# Patient Record
Sex: Male | Born: 1965
Health system: Southern US, Community
[De-identification: ages and names within clinical notes are randomized; demographics above are authoritative.]

## PROBLEM LIST (undated history)

## (undated) DIAGNOSIS — E039 Hypothyroidism, unspecified: Secondary | ICD-10-CM

## (undated) DIAGNOSIS — F319 Bipolar disorder, unspecified: Secondary | ICD-10-CM

## (undated) DIAGNOSIS — E079 Disorder of thyroid, unspecified: Secondary | ICD-10-CM

## (undated) DIAGNOSIS — K219 Gastro-esophageal reflux disease without esophagitis: Secondary | ICD-10-CM

## (undated) HISTORY — PX: ROTATOR CUFF REPAIR: SHX139

## (undated) HISTORY — PX: BACK SURGERY: SHX140

## (undated) HISTORY — PX: KNEE SURGERY: SHX244

## (undated) HISTORY — PX: NOSE SURGERY: SHX723

---

## 1998-11-24 ENCOUNTER — Encounter: Payer: Self-pay | Admitting: Orthopedic Surgery

## 1998-11-24 ENCOUNTER — Observation Stay (HOSPITAL_COMMUNITY): Admission: RE | Admit: 1998-11-24 | Discharge: 1998-11-25 | Payer: Self-pay | Admitting: Orthopedic Surgery

## 2000-08-02 ENCOUNTER — Inpatient Hospital Stay (HOSPITAL_COMMUNITY): Admission: EM | Admit: 2000-08-02 | Discharge: 2000-08-03 | Payer: Self-pay

## 2000-08-02 ENCOUNTER — Encounter: Payer: Self-pay | Admitting: Emergency Medicine

## 2000-08-15 ENCOUNTER — Inpatient Hospital Stay (HOSPITAL_COMMUNITY): Admission: EM | Admit: 2000-08-15 | Discharge: 2000-08-18 | Payer: Self-pay | Admitting: *Deleted

## 2002-02-14 ENCOUNTER — Observation Stay (HOSPITAL_COMMUNITY): Admission: RE | Admit: 2002-02-14 | Discharge: 2002-02-15 | Payer: Self-pay | Admitting: Orthopedic Surgery

## 2004-12-15 ENCOUNTER — Ambulatory Visit (HOSPITAL_COMMUNITY): Admission: RE | Admit: 2004-12-15 | Discharge: 2004-12-15 | Payer: Self-pay | Admitting: Internal Medicine

## 2008-01-25 ENCOUNTER — Ambulatory Visit (HOSPITAL_COMMUNITY): Admission: RE | Admit: 2008-01-25 | Discharge: 2008-01-25 | Payer: Self-pay | Admitting: Family Medicine

## 2010-10-01 ENCOUNTER — Encounter: Admission: RE | Admit: 2010-10-01 | Discharge: 2010-10-01 | Payer: Self-pay | Admitting: Family Medicine

## 2011-04-22 NOTE — Discharge Summary (Signed)
[facility]  Patient:    Dean [name], Dean [name]                MRN: [id number] Adm. Date:  [id number] Disc. Date: [id number] Attending:  Denny Peon                           Discharge Summary  INTRODUCTION:  Dean [name] is a 45 year old white separated male.  He was admitted to the unit after developing suicidal thoughts.  He has a history of suicidal attempt by overdose in 1992.  His current depression came from separation from patients wife and broken new relationship.  The details of the situation prior to admission and past history are available in admission note.  HOSPITAL COURSE:  After admission to the ward, the patient was placed on special observation.  He gave a convincing history of mood swings and episodes of hypomania interwoven with longer episodes of depression.  For this reason I started him on Depakote ER 500 mg at bedtime, which was increased on the day of discharge to 1000 mg daily.  I also started him on Celexa 10 mg at noon. During this brief hospital stay, patient started getting better very quickly. His affect became brighter.  He gained tremendous insight into his condition and seemed to be motivated to make some changes.  He was started on Depakote. He slept better.  His mood seemed to be stabilized.  There were no side effects from medication.  It was the undersigned physicians and treatment team felt he reached maximum benefits from brief hospitalization and could be safely discharged home.  MEDICAL PROBLEMS:  A physical examination was normal.  Vital signs showed normal blood pressure, pulse, lack of fever.  Weight 156, height 5 feet 11-1/2 inches.  LABORATORY:  Blood work showed normal CBC, slight elevation of TSH and T3 uptake, otherwise chemistry-17 and CBC were normal, normal urinalysis.  Drug screen upon admission was negative for substance abuse.  At the time of discharge the second level of Depakote  was not available.  DISCHARGE DIAGNOSES: Axis I:    Bipolar disorder 2, depressed. Axis II:   Dependent personality traits. Axis III:  No diagnosis. Axis IV:   Moderate stressors, problems with primary support group. Axis V:    GAF upon admission 25, upon discharge 50, maximum for past year            is 65.  DISCHARGE RECOMMENDATIONS:  Patient received prescription for Depakote ER 500 mg at bedtime and Celexa 25 mg half tablet daily.  He is supposed to check with family physician to repeat thyroid panel in one month and check valproic acid level and liver panel.  He is supposed to see Dr. Jodi Marble on September 27 at 10:45 a.m. and see therapist _________  on September 28 at 2 p.m.  Patient understood instructions and in good condition was discharged home. DD:  08/18/00 TD:  08/19/00 Job: [id number]

## 2011-04-22 NOTE — H&P (Signed)
[facility]  Patient:    Dean [name], Dean [name]                MRN: [id number] Adm. Date:  [id number] Attending:  Denny Peon Dictator:   Candi Leash. Orsini, N.P.                         History and Physical  REVIEW OF SYSTEMS:  The patient is a 45 year old male who has no complaints of fever or chills.  He reports a weight loss of about 19 pounds over the past four months.  Initial weight at that time was 175.  No headaches.  EYES:  No visual disturbance or double vision.  ENT/MOUTH:  No congestion, sinus pain, hearing loss.  CARDIAC:  No chest pain, palpitations or racing.  Negative hypertension or cardiac disease.  RESPIRATORY:  No cough, shortness of breath or dyspnea, nonsmoker.  GI:  No heartburn, nausea or vomiting, change in habits.  GU:  No dysuria, hematuria or frequency.  MUSCULOSKELETAL:  No cramps, joint stiffness or swelling.  SKIN:  No pruritus, hives or redness, no eczema.  NEUROLOGIC:  No weakness or tremor, no seizure.  States he had a mid concussion about two weeks from a head injury at work.  PSYCHIATRIC:  Some depression, loss of interest.  ENDOCRINE:  No hypoglycemic or hyperglycemic episodes.  HEME/LYMPH:  No bruising, enlarged or tender nodes.  ALLERGIES:  No abnormal sneezing or runny nose.  PHYSICAL EXAMINATION:  GENERAL:  This is a 45 year old white male sitting on the exam table in no acute distress  He is well-developed, appears his stated age.  He is well-groomed, alert and cooperative.  VITAL SIGNS:  Temperature 98.6, heart rate 61, respiratory rate 16, blood pressure 124/70.  His height is 5 feet 11 1/2 inches, 156 pounds.  HEAD:  Head is normocephalic.  He can raise his eyebrows.  EYES:  Pupils equal and reactive to light.  His extraocular muscles are intact bilaterally.  Funduscopic exam was difficult to visualize.  ENT/MOUTH:  External ears are patent no drainage.  TMs are intact.  No perforation or bulging  noted.  Nostrils are patent bilaterally, no drainage. No sinus tenderness.  His mouth mucosa is moist.  He has good dentition. No lesions were seen.  His tongue protrudes midline without tremor.  He can clench his teeth and puff out his cheeks.  His uvula is midline.  No pharyngeal exudate noted.  NECK:  Supple with full range of motion, no JVD, negative lymphadenopathy. His thyroid is nonpalpable, nontender, not enlarged.  His trachea is midline.  RESPIRATORY:  His breath sounds are clear to auscultation, no adventitious sounds.  CARDIOVASCULAR:  Heart rate is regular rate and rhythm without murmurs, rubs, or gallops.  Carotid pulses are equal.  No carotid bruits are auscultated. His pedal pulses are equal and adequate and no edema noted.  ABDOMEN:  Abdomen is flat, positive bowel sounds.  No masses, organomegaly or rebound tenderness.  No CVA tenderness.  LYMPH:  Negative lymphadenopathy.  MUSCULOSKELETAL:  No joint swelling or deformity.  Gait is normal.  Good range of motion.  Muscle strength and tone is equal bilaterally with resistance.  SKIN:  Warm and dry, good skin turgor.  Nail beds are pink with good capillary refill.  NEUROLOGIC:  He is oriented x 3.  His cranial nerves are grossly intact.  Deep tendon reflexes are 2+.  He has good grip strength bilaterally,  no involuntary movements.  His gait is normal.  His Romberg is negative. DD:  08/16/00 TD:  08/17/00 Job: [id number]

## 2011-04-22 NOTE — H&P (Signed)
[facility]  Patient:    Dean [name], Dean [name]                  MRN: [id number] Adm. Date:  08/15/00 Attending:  Netta Cedars, M.D.                   Psychiatric Admission Assessment  INTRODUCTION:  Dean [name] is a 45 year old white separated male. He was admitted after developing suicidal thoughts.  The patient has a history of suicide attempt by overdosing in 1992.  HISTORY OF PRESENT ILLNESS:  The patient complains of being depressed for over one month.  After he separated from his wife or being in the process of separation, he has developed a relationship with a lady with whom he later fell in love.  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  His girlfriend lives in [place].  The letters, emails and calls evolved into a relationship with sexual involvement.  The patient is deeply in love with her and yet, he feels guilty about dissolution of his marriage, especially causing pain to his wife.  A few days ago the patients girlfriend broke up with him.  She seemed to be tired of his guilty ruminations, depressive feelings and indecisiveness.  The patient reported for over a month problems with sleep which increased for the past few days, decreased appetite, obsessive worries feeling hopeless and helpless and having suicidal thoughts.  There were no signs of psychosis.  PAST PSYCHIATRIC HISTORY:  In 1992 he was hospitalized at [facility] with a suicide attempt.  He was started on Prilosec which made him angry and almost hypomanic.  He gives history of mood swings between normal or hypomanic and depressed.  He predominant mood for years was depressed and he attributed this to a loveless marriage.  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 He has a good work history.  His family is supportive, even though they do not approve of his decision to dissolve the  marriage.  He feels close to his two children.  The patient was married for 12 years and never had lasting extra-marital affairs, but he admitted having a few indiscretions.  FAMILY HISTORY:  Significant for depression of the patients sister.  ALCOHOL AND DRUG HISTORY:  The patient denies doing drugs, drinking, smoking, et Karie Soda.  PAST MEDICAL HISTORY:  The patient denies having any medical problems. Recently had several episodes of rapid heart beating which were considered as anxiety attacks.  ALLERGIES:  ERYTHROMYCIN.  PHYSICAL EXAMINATION:  Recent physical examination in the doctors office was normal.  MENTAL STATUS EXAMINATION:  The patient is a thin-built Mediterranean looking white male with sad facial expression, cooperative.  Normal speech, moving in slow pace.  Depressed mood and sad and tearful affect.  Thoughts were with slow pace but no blocking.  Logical, coherent, abstract thinking preserved. He had some suicidal thoughts recently but no articulated plan, feeling hopeless, helpless and very guilty.  Wants help, but does not know if it is possible to get help in his situation.  He is obsessing about the future, about his girlfriend and also about the pain he inflicted upon his wife. He is alert and oriented x 3 with good memory and fair concentration, normal intelligence.  Insight and judgment were both poor.  He sounded sincere.  DIAGNOSTIC IMPRESSION: Axis I:    1. Major depression, recurrent, moderate to severe.  2. Rule out bipolar II disorder, depressed. Axis II:   Dependent personality traits. Axis III:  No diagnosis. Axis IV:   Moderate stressors, problems with primary support group. Axis V:    Global assessment of functioning at present 25, maximum for past            year 65.  PLAN:  Will start the patient on special observation.  Because of history of "hypermanic" reaction to Prozac and history of "mood swings" will start him on Depakote ER  500 mg h.s.  The patient does not give history of pancreatitis or liver disease.  Blood work is pending.  Will start Celexa 10 mg daily to help with depression and obsessive thoughts.  Will try to arrange family meeting, possibly with patients parents to discuss how we can support him in this transitional period.  Will involve him in intensive group therapy while on the unit.  When stable the patient can be safely discharged home.  Side effects of medication were explained in detail to the patient. DD:  08/16/00 TD:  08/17/00 Job: [id number]

## 2011-04-22 NOTE — Op Note (Signed)
[facility]  Patient:    Dean [name], Dean [name] Visit Number: [id number] MRN: [id number]          Service Type: SUR Location: 4W [id number] 01 Attending Physician:  Skip Mayer Dictated by:   Georges Lynch Darrelyn Hillock, M.D. Proc. Date: 02/14/02 Admit Date:  02/14/2002 Discharge Date: 02/15/2002                             Operative Report  SURGEON:  Windy Fast A. Darrelyn Hillock, M.D.  ASSISTANTDruscilla Brownie. Underwood III, P.A.-C.  PREOPERATIVE DIAGNOSES: 1. Tear of the rotator cuff tendon on the right. 2. Severe impingement syndrome of the right shoulder.  POSTOPERATIVE DIAGNOSES: 1. Tear to the rotator cuff tendon on the right. 2. Severe impingement syndrome of the right shoulder.  OPERATION: 1. Partial acromionectomy and acromioplasty. 2. Repair of a tear in the rotator cuff utilizing some transverse sutures.  DESCRIPTION OF PROCEDURE:  The patient first had an intrascalene block.  He was taken back to surgery and under general anesthesia, a routine orthopedic prep and draping of the shoulder on the right was carried out.  He had 1 g of IV Ancef.  At this time, an incision was made over the anterior aspect of the right shoulder.  Bleeders were identified and cauterized.  Following this, self-retaining retractors were inserted.  I then separated the deltoid tendon by sharp dissection from the acromion.  He had a severe impingement syndrome. The acromion literally was imbedding down into his rotator cuff, and he had a hole in his rotator cuff at its insertion.  I thoroughly irrigated out the area.  First, I protected the remaining part of the cuff with a Bennett retractor after I excised the subdeltoid bursa.  I then utilized the oscillating saw and did a partial acromionectomy.  I then utilized the bur to even out the rough bone edges.  Once this was done, we had a good amount of space present now for his shoulder motion.  I then went down and opened  the cuff.  He had a hole distally.  I opened it and retracted the cuff a little bit so I could bur up under the cuff.  I then irrigated the area and then sewed the cuff back down with a #1 Ethibond suture.  The wound was thoroughly irrigated, the deltoid tendon and muscle were reattached in the usual fashion. I then closed the subcu with 0 Vicryl.  Skin was closed with metal staples. Neosporin dressing was placed on his wound, and I then placed him in a shoulder immobilizer. Dictated by:   Georges Lynch Darrelyn Hillock, M.D. Attending Physician:  Skip Mayer DD:  02/14/02 TD:  02/16/02 Job: [id number]

## 2011-04-22 NOTE — Consult Note (Signed)
[place]  Patient:    Dean [name], Dean [name]                MRN: [id number] Proc. Date: 08/02/00 Adm. Date:  [id number] Attending:  Trauma, Md CC:         Lorne Skeens. Hoxworth, M.D.   Consultation Report  REASON FOR CONSULTATION:  Consultation requested by emergency room and trauma service after a closed head injury.  HISTORY OF PRESENT ILLNESS:  The patient is a 45 year old white male who slipped and fell backwards, hitting the back of his head.  He has amnesia for the event but does have good memory both before and after the event.  He could not move or feel arms or legs immediately after the event.  He was brought to the hospital and about that time did start having feeling and then started moving both arms and legs, at first weakly, but then better and better.  He was able to urinate on his own and now complains of posterior occipital headache and some low back pain but has no neck pain and no complaints with feeling or moving of his upper and lower extremities.  PAST MEDICAL HISTORY:  Otherwise negative.  MEDICATIONS:  None.  ALLERGIES:  No known drug allergies.  SOCIAL HISTORY:  He is employed.  REVIEW OF SYSTEMS:  Otherwise negative.  PHYSICAL EXAMINATION:  NEUROLOGIC:  He is awake, alert, oriented x 3.  Cranial nerves II-XII were examined and were intact.  He has appropriate affect and normal fund of knowledge.  No language dysfunction, no aphasia, no anomia.  Motor strength was done moving upper and lower extremities well, 4+ to 5/5 in strength in all muscle groups.  There is no pronator drift.  Sensation is intact in all distributions.  No sensory level found.  Rapid alternating movements are done fairly well in the upper and lower extremities and equal bilaterally.  Toes are downgoing bilaterally.  There is no clonus in either ankle.  Deep tendon reflexes are 2/4 in the upper and lower extremities.  Gait is deferred at  this point.  We did evaluate the CTs prior to having him move his neck.  HEENT:  Head is normocephalic.  No sign of trauma.  TMs are clear.  NECK:  Nontender, and he he has good range of motion in all directions widow any pain or any radicular symptoms.  DIAGNOSTIC EVALUATION:  X-rays of the thoracic and lumbar spine were negative for any fracture or any acute changes.  There is some L5-S1 spondylosis.  CT of the head was negative with no fractures, no intracranial hemorrhage, no _____ midline shift.  The ventricles are normal.  The CT of the cervical spine along with the reconstruction and sagittal views showed normal alignment and no fractures, no sign of herniated disk, no cord compression, no stenosis.  ASSESSMENT:  Patient with closed head injury and spinal concussion, but symptoms now resolved.  I agree that he needs to be admitted.  The trauma service can admit him for observation.  We will definitely follow along.  He can increase activity as tolerated and hopefully discharge soon, hopefully with no further sequelae. DD:  08/02/00 TD:  08/03/00 Job: [id number]

## 2015-08-19 ENCOUNTER — Encounter (HOSPITAL_COMMUNITY): Payer: Self-pay | Admitting: *Deleted

## 2015-08-19 ENCOUNTER — Emergency Department (HOSPITAL_COMMUNITY)
Admission: EM | Admit: 2015-08-19 | Discharge: 2015-08-20 | Disposition: A | Discharge status: Home / Self Care | Payer: 59 | Attending: Emergency Medicine | Admitting: Emergency Medicine

## 2015-08-19 ENCOUNTER — Emergency Department (HOSPITAL_COMMUNITY): Payer: 59

## 2015-08-19 DIAGNOSIS — E079 Disorder of thyroid, unspecified: Secondary | ICD-10-CM | POA: Insufficient documentation

## 2015-08-19 DIAGNOSIS — Z87891 Personal history of nicotine dependence: Secondary | ICD-10-CM | POA: Diagnosis not present

## 2015-08-19 DIAGNOSIS — F319 Bipolar disorder, unspecified: Secondary | ICD-10-CM | POA: Diagnosis not present

## 2015-08-19 DIAGNOSIS — R0602 Shortness of breath: Secondary | ICD-10-CM | POA: Insufficient documentation

## 2015-08-19 DIAGNOSIS — R079 Chest pain, unspecified: Secondary | ICD-10-CM | POA: Diagnosis not present

## 2015-08-19 DIAGNOSIS — Z79899 Other long term (current) drug therapy: Secondary | ICD-10-CM | POA: Diagnosis not present

## 2015-08-19 HISTORY — DX: Bipolar disorder, unspecified: F31.9

## 2015-08-19 HISTORY — DX: Disorder of thyroid, unspecified: E07.9

## 2015-08-19 LAB — BASIC METABOLIC PANEL
ANION GAP: 12 (ref 5–15)
BUN: 14 mg/dL (ref 6–20)
CALCIUM: 10 mg/dL (ref 8.9–10.3)
CHLORIDE: 98 mmol/L — AB (ref 101–111)
CO2: 25 mmol/L (ref 22–32)
Creatinine, Ser: 1.15 mg/dL (ref 0.61–1.24)
GFR calc non Af Amer: >60 mL/min (ref >60)
Glucose, Bld: 89 mg/dL (ref 65–99)
Potassium: 3.6 mmol/L (ref 3.5–5.1)
Sodium: 135 mmol/L (ref 135–145)

## 2015-08-19 LAB — CBC
HCT: 46.7 % (ref 39.0–52.0)
HEMOGLOBIN: 17.1 g/dL — AB (ref 13.0–17.0)
MCH: 35.2 pg — AB (ref 26.0–34.0)
MCHC: 36.6 g/dL — ABNORMAL HIGH (ref 30.0–36.0)
MCV: 96.1 fL (ref 78.0–100.0)
Platelets: 170 10*3/uL (ref 150–400)
RBC: 4.86 MIL/uL (ref 4.22–5.81)
RDW: 12.5 % (ref 11.5–15.5)
WBC: 5 10*3/uL (ref 4.0–10.5)

## 2015-08-19 LAB — I-STAT TROPONIN, ED
TROPONIN I, POC: 0 ng/mL (ref 0.00–0.08)
Troponin i, poc: 0 ng/mL (ref 0.00–0.08)

## 2015-08-19 MED ORDER — KETOROLAC TROMETHAMINE 60 MG/2ML IM SOLN
30.0000 mg | Freq: Once | INTRAMUSCULAR | Status: AC
  Administered 2015-08-19: 30 mg via INTRAMUSCULAR
  Filled 2015-08-19: qty 2

## 2015-08-19 NOTE — ED Notes (Signed)
Started with a HA and took Tylenol.  The HA went away and then started feeling "funny" with central CP that radiated to his left neck and shoulder +SOB

## 2015-08-19 NOTE — ED Provider Notes (Signed)
CSN: [id number]     Arrival date & time 08/19/15  1911 History   First MD Initiated Contact with Patient 08/19/15 2104     Chief Complaint  Patient presents with  . Chest Pain  . Shortness of Breath    (Consider location/radiation/quality/duration/timing/severity/associated sxs/prior Treatment) HPI Comments: 49 year old male with a history of bipolar 1 disorder and hypothyroidism presents to the emergency department for further evaluation of chest pain. Patient states that chest pain began at 1700. He describes the pain as central and tightening, radiating to between his shoulder blades and his left neck and shoulder. Patient was some associated shortness of breath. He states that he feels as though his symptoms have improved slightly since onset. They have been constant. He reports the symptoms were preceded by a headache for which she took Tylenol. Headache resolved prior to onset of chest discomfort. Patient denies associated fever, lightheadedness, dizziness, syncope, nausea, vomiting, diaphoresis, extremity numbness/tingling, and extremity weakness. He states that he feels as though his legs has been "heavy", but he has had no difficulty ambulating. No personal history of hypertension, diabetes, or ACS. No family history of ACS that is known to the patient, but he does report that his father has had stents placed.  Patient is a 49 y.o. male presenting with chest pain and shortness of breath. The history is provided by the patient. No language interpreter was used.  Chest Pain Associated symptoms: shortness of breath   Associated symptoms: no diaphoresis, no dizziness, no fever, no nausea and not vomiting   Shortness of Breath Associated symptoms: chest pain   Associated symptoms: no diaphoresis, no fever and no vomiting     Past Medical History  Diagnosis Date  . Bipolar 1 disorder   . Thyroid disease    Past Surgical History  Procedure Laterality Date  . Back surgery    . Rotator  cuff repair    . Nose surgery    . Knee surgery     No family history on file. Social History  Substance Use Topics  . Smoking status: Former Research scientist (life sciences)  . Smokeless tobacco: Never Used  . Alcohol Use: Yes    Review of Systems  Constitutional: Negative for fever and diaphoresis.  Respiratory: Positive for shortness of breath.   Cardiovascular: Positive for chest pain.  Gastrointestinal: Negative for nausea and vomiting.  Neurological: Negative for dizziness, syncope and light-headedness.  All other systems reviewed and are negative.   Allergies  Ibuprofen  Home Medications   Prior to Admission medications   Medication Sig Start Date End Date Taking? Authorizing Provider  ALPRAZolam Duanne Moron) 0.5 MG tablet Take 0.5 mg by mouth at bedtime as needed for anxiety.   Yes Historical Provider, MD  divalproex (DEPAKOTE ER) 500 MG 24 hr tablet Take 500 mg by mouth 4 (four) times daily. Take 4 times a day per patient   Yes Historical Provider, MD  levothyroxine (SYNTHROID, LEVOTHROID) 175 MCG tablet Take 175 mcg by mouth daily before breakfast.   Yes Historical Provider, MD  pantoprazole (PROTONIX) 40 MG tablet Take 40 mg by mouth daily.   Yes Historical Provider, MD   BP 141/94 mmHg  Pulse 59  Temp(Src) 97.9 F (36.6 C) (Oral)  Resp 14  Ht 5\' 11"  (1.803 m)  Wt 187 lb (84.823 kg)  BMI 26.09 kg/m2  SpO2 99%   Physical Exam  Constitutional: He is oriented to person, place, and time. He appears well-developed and well-nourished. No distress.  Nontoxic/nonseptic appearing  HENT:  Head: Normocephalic and atraumatic.  Eyes: Conjunctivae and EOM are normal. No scleral icterus.  Neck: Normal range of motion.  Cardiovascular: Normal rate, regular rhythm and intact distal pulses.   Pulmonary/Chest: Effort normal and breath sounds normal. No respiratory distress. He has no wheezes. He has no rales.  Respirations even and unlabored. Lungs clear.  Abdominal: Soft. He exhibits no distension.  There is no tenderness.  Soft, nontender  Musculoskeletal: Normal range of motion.  Neurological: He is alert and oriented to person, place, and time. He exhibits normal muscle tone. Coordination normal.  GCS 15. Speech is goal oriented. Patient moves extremities without ataxia. He is ambulatory with steady gait.  Skin: Skin is warm and dry. No rash noted. He is not diaphoretic. No erythema. No pallor.  Psychiatric: He has a normal mood and affect. His behavior is normal.  Nursing note and vitals reviewed.   ED Course  Procedures (including critical care time) Labs Review Labs Reviewed  BASIC METABOLIC PANEL - Abnormal; Notable for the following:    Chloride 98 (*)    All other components within normal limits  CBC - Abnormal; Notable for the following:    Hemoglobin 17.1 (*)    MCH 35.2 (*)    MCHC 36.6 (*)    All other components within normal limits  I-STAT TROPOININ, ED  Randolm Idol, ED    Imaging Review Dg Chest 2 View  08/19/2015   CLINICAL DATA:  Shortness of breath and midsternal chest pain today.  EXAM: CHEST  2 VIEW  COMPARISON:  None.  FINDINGS: The cardiac silhouette, mediastinal and hilar contours are within normal limits. The lungs are clear. No pleural effusion. The bony thorax is intact.  IMPRESSION: No acute cardiopulmonary findings.   Electronically Signed   By: Marijo Sanes M.D.   On: 08/19/2015 20:23   I have personally reviewed and evaluated these images and lab results as part of my medical decision-making.   EKG Interpretation   Date/Time:  Wednesday August 19 2015 19:15:09 EDT Ventricular Rate:  82 PR Interval:  142 QRS Duration: 84 QT Interval:  388 QTC Calculation: 453 R Axis:   77 Text Interpretation:  Normal sinus rhythm Normal ECG No previous ECGs  available Confirmed by ZACKOWSKI  MD, SCOTT [phone]) on 08/19/2015 11:07:26  PM      2145 - Heart Score - 3 (suspicion, age, and risk factors) c/w low risk of MACE PERC negative Plan  delta troponin at 2230. Patient agreeable to plan. MDM   Final diagnoses:  Chest pain, unspecified chest pain type    49 year old male presents to the emergency department for evaluation of chest pain. Patient has a nonischemic EKG. Troponin negative 2. Pain has slightly improved without intervention. Doubt ACS as cause of symptoms. Heart score today is 3 consistent with low risk of ACS. No mediastinal widening to suggest dissection. Chest x-ray negative for pneumothorax, pneumonia, and pleural effusion. Also doubt pulmonary embolus. Symptoms may be secondary to anxiety. Patient has a known history of bipolar 1 disorder. Have advised the patient follow-up with his primary care doctor for further evaluation of his symptoms as well as for scheduling of an outpatient stress test. No indication for further emergent workup at this time. Return precautions discussed and provided. Patient agreeable to plan with no unaddressed concerns. Patient discharged in good condition; VSS.   Filed Vitals:   08/19/15 1922 08/19/15 2123 08/19/15 2140  BP: 124/84 150/90 141/94  Pulse: 68 62 59  Temp: 97.9  F (36.6 C)    TempSrc: Oral    Resp: 16  14  Height: 5\' 11"  (1.803 m)    Weight: 187 lb (84.823 kg)    SpO2: 97% 99% 99%     Antonietta Breach, PA-C 08/19/15 2318  Antonietta Breach, PA-C 08/20/15 0000  Fredia Sorrow, MD 08/20/15 [phone]

## 2015-08-19 NOTE — Discharge Instructions (Signed)

## 2015-08-20 NOTE — ED Notes (Signed)
Pt able to ambulate independently.  Gait steady and even.

## 2016-09-14 ENCOUNTER — Ambulatory Visit
Admission: RE | Admit: 2016-09-14 | Discharge: 2016-09-14 | Disposition: A | Discharge status: Home / Self Care | Payer: 59 | Source: Ambulatory Visit | Attending: Physician Assistant | Admitting: Physician Assistant

## 2016-09-14 ENCOUNTER — Other Ambulatory Visit: Payer: Self-pay | Admitting: Physician Assistant

## 2016-09-14 DIAGNOSIS — M25551 Pain in right hip: Secondary | ICD-10-CM

## 2016-12-12 DIAGNOSIS — F331 Major depressive disorder, recurrent, moderate: Secondary | ICD-10-CM | POA: Diagnosis not present

## 2016-12-13 DIAGNOSIS — M25551 Pain in right hip: Secondary | ICD-10-CM | POA: Diagnosis not present

## 2016-12-13 DIAGNOSIS — G8929 Other chronic pain: Secondary | ICD-10-CM | POA: Diagnosis not present

## 2016-12-29 DIAGNOSIS — M1611 Unilateral primary osteoarthritis, right hip: Secondary | ICD-10-CM | POA: Diagnosis not present

## 2017-03-15 DIAGNOSIS — Z0000 Encounter for general adult medical examination without abnormal findings: Secondary | ICD-10-CM | POA: Diagnosis not present

## 2017-03-15 DIAGNOSIS — E039 Hypothyroidism, unspecified: Secondary | ICD-10-CM | POA: Diagnosis not present

## 2017-03-15 DIAGNOSIS — Z23 Encounter for immunization: Secondary | ICD-10-CM | POA: Diagnosis not present

## 2017-03-16 DIAGNOSIS — R7303 Prediabetes: Secondary | ICD-10-CM | POA: Diagnosis not present

## 2017-09-14 DIAGNOSIS — K219 Gastro-esophageal reflux disease without esophagitis: Secondary | ICD-10-CM | POA: Diagnosis not present

## 2017-09-14 DIAGNOSIS — M1990 Unspecified osteoarthritis, unspecified site: Secondary | ICD-10-CM | POA: Diagnosis not present

## 2017-09-14 DIAGNOSIS — E039 Hypothyroidism, unspecified: Secondary | ICD-10-CM | POA: Diagnosis not present

## 2017-10-10 DIAGNOSIS — M7062 Trochanteric bursitis, left hip: Secondary | ICD-10-CM | POA: Diagnosis not present

## 2017-10-16 DIAGNOSIS — M7062 Trochanteric bursitis, left hip: Secondary | ICD-10-CM | POA: Diagnosis not present

## 2017-10-23 DIAGNOSIS — M7062 Trochanteric bursitis, left hip: Secondary | ICD-10-CM | POA: Diagnosis not present

## 2018-02-13 DIAGNOSIS — F331 Major depressive disorder, recurrent, moderate: Secondary | ICD-10-CM | POA: Diagnosis not present

## 2018-08-11 DIAGNOSIS — M109 Gout, unspecified: Secondary | ICD-10-CM | POA: Diagnosis not present

## 2018-08-24 DIAGNOSIS — M79674 Pain in right toe(s): Secondary | ICD-10-CM | POA: Diagnosis not present

## 2018-12-24 ENCOUNTER — Other Ambulatory Visit: Payer: Self-pay | Admitting: Psychiatry

## 2018-12-24 NOTE — Telephone Encounter (Signed)
Review paper chart not seen in epic 

## 2018-12-25 ENCOUNTER — Encounter: Payer: Self-pay | Admitting: Emergency Medicine

## 2018-12-25 DIAGNOSIS — F319 Bipolar disorder, unspecified: Secondary | ICD-10-CM | POA: Insufficient documentation

## 2019-02-12 ENCOUNTER — Ambulatory Visit (INDEPENDENT_AMBULATORY_CARE_PROVIDER_SITE_OTHER): Payer: BLUE CROSS/BLUE SHIELD | Admitting: Psychiatry

## 2019-02-12 ENCOUNTER — Encounter: Payer: Self-pay | Admitting: Psychiatry

## 2019-02-12 DIAGNOSIS — F3181 Bipolar II disorder: Secondary | ICD-10-CM

## 2019-02-12 DIAGNOSIS — F5105 Insomnia due to other mental disorder: Secondary | ICD-10-CM | POA: Diagnosis not present

## 2019-02-12 MED ORDER — DIVALPROEX SODIUM ER 500 MG PO TB24: 2500.0000 mg | ORAL_TABLET | Freq: Every day | ORAL | 3 refills | Status: DC

## 2019-02-12 NOTE — Progress Notes (Signed)
Dean [name] [id number] Jun 24, 1966 53 y.o.  Subjective:   Patient ID:  Dean [name] is a 53 y.o. (DOB August 22, 1966) male.  Chief Complaint:  Chief Complaint  Patient presents with  . Follow-up    Medication Management    HPI Dean [name] presents to the office today for follow-up of bipolar disorder.  Recent changes 2 jobs starting February which reduced amount of sleep.  About 6-7 hours of sleep.  Tries to make up on the weekend.  Mood has been ok.  Tired and handling it better than expected.    Patient reports stable mood and denies depressed or irritable moods.  Patient denies any recent difficulty with anxiety.  Patient denies difficulty with sleep initiation or maintenance bc he's so tired.  Not using Xanax bc hangover 3 hours.. Denies appetite disturbance.  Patient reports that energy and motivation have been good.  Patient denies any difficulty with concentration.  Patient denies any suicidal ideation.  Past Psychiatric Medication Trials: Depakote 2500 mg, buspirone 15 twice daily, Lexapro 10, Xanax XR, alprazolam, Ambien, citalopram 20, hydroxyzine, trazodone  Review of Systems:  Review of Systems  Musculoskeletal: Positive for arthralgias.  Neurological: Negative for tremors and weakness.  Psychiatric/Behavioral: Negative for agitation, behavioral problems, confusion, decreased concentration, dysphoric mood, hallucinations, self-injury, sleep disturbance and suicidal ideas. The patient is not nervous/anxious and is not hyperactive.     Medications: I have reviewed the patient's current medications.  Current Outpatient Medications  Medication Sig Dispense Refill  . ALPRAZolam (XANAX) 0.5 MG tablet Take 0.5 mg by mouth at bedtime as needed for anxiety.    . divalproex (DEPAKOTE ER) 500 MG 24 hr tablet Take 5 tablets (2,500 mg total) by mouth daily. 450 tablet 3  . levothyroxine (SYNTHROID, LEVOTHROID) 175 MCG tablet Take 175 mcg by mouth daily before  breakfast.    . Multiple Vitamins-Minerals (ONE DAILY MENS 50+ MULTIVIT) TABS Take by mouth.    . pantoprazole (PROTONIX) 40 MG tablet Take 40 mg by mouth daily.     No current facility-administered medications for this visit.     Medication Side Effects: None  Allergies:  Allergies  Allergen Reactions  . Erythromycin Nausea Only  . Ibuprofen Other (See Comments)    Peptic Ulcer    Past Medical History:  Diagnosis Date  . Bipolar 1 disorder ([place])   . Thyroid disease     History reviewed. No pertinent family history.  Social History   Socioeconomic History  . Marital status: Married    Spouse name: Not on file  . Number of children: Not on file  . Years of education: Not on file  . Highest education level: Not on file  Occupational History  . Not on file  Social Needs  . Financial resource strain: Not on file  . Food insecurity:    Worry: Not on file    Inability: Not on file  . Transportation needs:    Medical: Not on file    Non-medical: Not on file  Tobacco Use  . Smoking status: Former Research scientist (life sciences)  . Smokeless tobacco: Never Used  Substance and Sexual Activity  . Alcohol use: Yes  . Drug use: No  . Sexual activity: Not on file  Lifestyle  . Physical activity:    Days per week: Not on file    Minutes per session: Not on file  . Stress: Not on file  Relationships  . Social connections:    Talks on phone: Not on file  Gets together: Not on file    Attends religious service: Not on file    Active member of club or organization: Not on file    Attends meetings of clubs or organizations: Not on file    Relationship status: Not on file  . Intimate partner violence:    Fear of current or ex partner: Not on file    Emotionally abused: Not on file    Physically abused: Not on file    Forced sexual activity: Not on file  Other Topics Concern  . Not on file  Social History Narrative  . Not on file    Past Medical History, Surgical history, Social history,  and Family history were reviewed and updated as appropriate.   Please see review of systems for further details on the patient's review from today.   Objective:   Physical Exam:  There were no vitals taken for this visit.  Physical Exam Constitutional:      General: He is not in acute distress.    Appearance: He is well-developed.  Musculoskeletal:        General: No deformity.  Neurological:     Mental Status: He is alert and oriented to person, place, and time.     Motor: No tremor.     Coordination: Coordination normal.     Gait: Gait normal.  Psychiatric:        Attention and Perception: Attention normal. He is attentive. He does not perceive auditory hallucinations.        Mood and Affect: Mood normal. Mood is not anxious or depressed. Affect is not labile, blunt, angry or inappropriate.        Speech: Speech normal.        Behavior: Behavior normal.        Thought Content: Thought content normal. Thought content does not include homicidal or suicidal ideation. Thought content does not include homicidal or suicidal plan.        Cognition and Memory: Cognition normal.        Judgment: Judgment normal.     Comments: Insight is good.     Lab Review:     Component Value Date/Time   NA 135 08/19/2015 1925   K 3.6 08/19/2015 1925   CL 98 (L) 08/19/2015 1925   CO2 25 08/19/2015 1925   GLUCOSE 89 08/19/2015 1925   BUN 14 08/19/2015 1925   CREATININE 1.15 08/19/2015 1925   CALCIUM 10.0 08/19/2015 1925   GFRNONAA >60 08/19/2015 1925   GFRAA >60 08/19/2015 1925       Component Value Date/Time   WBC 5.0 08/19/2015 1925   RBC 4.86 08/19/2015 1925   HGB 17.1 (H) 08/19/2015 1925   HCT 46.7 08/19/2015 1925   PLT 170 08/19/2015 1925   MCV 96.1 08/19/2015 1925   MCH 35.2 (H) 08/19/2015 1925   MCHC 36.6 (H) 08/19/2015 1925   RDW 12.5 08/19/2015 1925    No results found for: POCLITH, LITHIUM   No results found for: PHENYTOIN, PHENOBARB, VALPROATE, CBMZ    .res Assessment: Plan:    Bipolar II disorder ([place]) - Plan: divalproex (DEPAKOTE ER) 500 MG 24 hr tablet  Insomnia due to mental condition   Disc risk mood cycling even on meds and to call us if this occurs.  Benefit from meds.  Is actually taking 4 Depakote instead of 5 bc better tolerated and doing well.  At one point he took 2500 mg Depakote because of irritability but that has leveled  off.  His sleep is improved because of heavy work schedule.Marland Kitchen  He has a history of chronic insomnia.  Because stable for extended period and for cost reasons he has to be seen once yearly.  He asked to defer any labs.  Discussed risks of Depakote to the liver and potentially pancreas.  Continue current meds.  He agrees  Hiram Comber, MD, DFAPA Please see After Visit Summary for patient specific instructions.  Future Appointments  Date Time Provider [place]  02/12/2020  9:45 AM Cottle, Billey Co., MD [facility] None    No orders of the defined types were placed in this encounter.     -------------------------------

## 2019-03-06 DIAGNOSIS — E039 Hypothyroidism, unspecified: Secondary | ICD-10-CM | POA: Diagnosis not present

## 2019-03-06 DIAGNOSIS — Z125 Encounter for screening for malignant neoplasm of prostate: Secondary | ICD-10-CM | POA: Diagnosis not present

## 2019-03-06 DIAGNOSIS — Z0000 Encounter for general adult medical examination without abnormal findings: Secondary | ICD-10-CM | POA: Diagnosis not present

## 2019-03-06 DIAGNOSIS — R7301 Impaired fasting glucose: Secondary | ICD-10-CM | POA: Diagnosis not present

## 2019-03-06 DIAGNOSIS — E7889 Other lipoprotein metabolism disorders: Secondary | ICD-10-CM | POA: Diagnosis not present

## 2019-04-03 DIAGNOSIS — D72819 Decreased white blood cell count, unspecified: Secondary | ICD-10-CM | POA: Diagnosis not present

## 2019-04-30 DIAGNOSIS — D72819 Decreased white blood cell count, unspecified: Secondary | ICD-10-CM | POA: Diagnosis not present

## 2019-07-16 ENCOUNTER — Other Ambulatory Visit: Payer: Self-pay

## 2019-07-16 MED ORDER — ALPRAZOLAM 0.5 MG PO TABS: 0.5000 mg | ORAL_TABLET | Freq: Every evening | ORAL | 0 refills | Status: DC | PRN

## 2019-07-25 DIAGNOSIS — Z01818 Encounter for other preprocedural examination: Secondary | ICD-10-CM | POA: Diagnosis not present

## 2019-07-25 DIAGNOSIS — Z1211 Encounter for screening for malignant neoplasm of colon: Secondary | ICD-10-CM | POA: Diagnosis not present

## 2019-09-23 DIAGNOSIS — E039 Hypothyroidism, unspecified: Secondary | ICD-10-CM | POA: Diagnosis not present

## 2019-09-23 DIAGNOSIS — F319 Bipolar disorder, unspecified: Secondary | ICD-10-CM | POA: Diagnosis not present

## 2019-09-26 DIAGNOSIS — Z1159 Encounter for screening for other viral diseases: Secondary | ICD-10-CM | POA: Diagnosis not present

## 2019-09-26 DIAGNOSIS — E039 Hypothyroidism, unspecified: Secondary | ICD-10-CM | POA: Diagnosis not present

## 2019-10-01 DIAGNOSIS — D125 Benign neoplasm of sigmoid colon: Secondary | ICD-10-CM | POA: Diagnosis not present

## 2019-10-01 DIAGNOSIS — K635 Polyp of colon: Secondary | ICD-10-CM | POA: Diagnosis not present

## 2019-10-01 DIAGNOSIS — D124 Benign neoplasm of descending colon: Secondary | ICD-10-CM | POA: Diagnosis not present

## 2019-10-01 DIAGNOSIS — D128 Benign neoplasm of rectum: Secondary | ICD-10-CM | POA: Diagnosis not present

## 2019-10-01 DIAGNOSIS — K640 First degree hemorrhoids: Secondary | ICD-10-CM | POA: Diagnosis not present

## 2019-10-01 DIAGNOSIS — Z1211 Encounter for screening for malignant neoplasm of colon: Secondary | ICD-10-CM | POA: Diagnosis not present

## 2019-10-01 DIAGNOSIS — D123 Benign neoplasm of transverse colon: Secondary | ICD-10-CM | POA: Diagnosis not present

## 2020-02-12 ENCOUNTER — Ambulatory Visit: Payer: BLUE CROSS/BLUE SHIELD | Admitting: Psychiatry

## 2020-02-14 ENCOUNTER — Encounter: Payer: Self-pay | Admitting: Psychiatry

## 2020-02-14 ENCOUNTER — Ambulatory Visit (INDEPENDENT_AMBULATORY_CARE_PROVIDER_SITE_OTHER): Payer: BC Managed Care – PPO | Admitting: Psychiatry

## 2020-02-14 DIAGNOSIS — F3181 Bipolar II disorder: Secondary | ICD-10-CM

## 2020-02-14 DIAGNOSIS — F5105 Insomnia due to other mental disorder: Secondary | ICD-10-CM

## 2020-02-14 MED ORDER — DIVALPROEX SODIUM ER 500 MG PO TB24: 2500.0000 mg | ORAL_TABLET | Freq: Every day | ORAL | 3 refills | Status: DC

## 2020-02-14 NOTE — Progress Notes (Signed)
Dean [name] [id number] 1966/04/01 54 y.o.  Virtual Visit via [facility]  I connected with pt by WebEx and verified that I am speaking with the correct person using two identifiers.   I discussed the limitations, risks, security and privacy concerns of performing an evaluation and management service by [facility] and the availability of in person appointments. I also discussed with the patient that there may be a patient responsible charge related to this service. The patient expressed understanding and agreed to proceed.  I discussed the assessment and treatment plan with the patient. The patient was provided an opportunity to ask questions and all were answered. The patient agreed with the plan and demonstrated an understanding of the instructions.   The patient was advised to call back or seek an in-person evaluation if the symptoms worsen or if the condition fails to improve as anticipated.  I provided 30 minutes of video time during this encounter. The call started at 1100 and ended at 11:30. The patient was located at home and the provider was located office.  Subjective:   Patient ID:  Dean [name] is a 54 y.o. (DOB 1966/03/16) male.  Chief Complaint:  Chief Complaint  Patient presents with  . Follow-up    Medication Management  . Other    Bipolar 2    HPI Yasmin Zucca Clum presents today for follow-up of bipolar disorder.  Last seen March 2020.  No meds were changed. Depakote ER 2500 mg daily.  Rare Xanax.  Managing Covid.  Got first vaccination.   Still working 2 jobs.  Better than last year.  Used to schedule.No problems with meds.    About 6-7 hours of sleep.  Tries to make up on the weekend.  Mood has been ok.  Tired and handling it better than expected.    Patient reports stable mood and denies depressed or irritable moods.  Patient denies any recent difficulty with anxiety.  Patient denies difficulty with sleep initiation or maintenance bc he's so tired.   Not using Xanax bc hangover 3 hours.. Denies appetite disturbance.  Patient reports that energy and motivation have been good.  Patient denies any difficulty with concentration.  Patient denies any suicidal ideation.  Past Psychiatric Medication Trials: Depakote 2500 mg, buspirone 15 twice daily, Lexapro 10, Xanax XR, alprazolam, Ambien, citalopram 20, hydroxyzine, trazodone  Review of Systems:  Review of Systems  Musculoskeletal: Positive for arthralgias.  Neurological: Negative for tremors and weakness.  Psychiatric/Behavioral: Negative for agitation, behavioral problems, confusion, decreased concentration, dysphoric mood, hallucinations, self-injury, sleep disturbance and suicidal ideas. The patient is not nervous/anxious and is not hyperactive.     Medications: I have reviewed the patient's current medications.  Current Outpatient Medications  Medication Sig Dispense Refill  . ALPRAZolam (XANAX) 0.5 MG tablet Take 1 tablet (0.5 mg total) by mouth at bedtime as needed for anxiety. 30 tablet 0  . divalproex (DEPAKOTE ER) 500 MG 24 hr tablet Take 5 tablets (2,500 mg total) by mouth daily. 450 tablet 3  . levothyroxine (SYNTHROID) 137 MCG tablet Take 137 mcg by mouth daily.    . Multiple Vitamins-Minerals (ONE DAILY MENS 50+ MULTIVIT) TABS Take by mouth.    . pantoprazole (PROTONIX) 40 MG tablet Take 40 mg by mouth daily.     No current facility-administered medications for this visit.    Medication Side Effects: None  Allergies:  Allergies  Allergen Reactions  . Erythromycin Nausea Only  . Ibuprofen Other (See Comments)    Peptic Ulcer  Past Medical History:  Diagnosis Date  . Bipolar 1 disorder ([place])   . Thyroid disease     History reviewed. No pertinent family history.  Social History   Socioeconomic History  . Marital status: Married    Spouse name: Not on file  . Number of children: Not on file  . Years of education: Not on file  . Highest education level: Not  on file  Occupational History  . Not on file  Tobacco Use  . Smoking status: Former Research scientist (life sciences)  . Smokeless tobacco: Never Used  Substance and Sexual Activity  . Alcohol use: Yes  . Drug use: No  . Sexual activity: Not on file  Other Topics Concern  . Not on file  Social History Narrative  . Not on file   Social Determinants of Health   Financial Resource Strain:   . Difficulty of Paying Living Expenses:   Food Insecurity:   . Worried About Charity fundraiser in the Last Year:   . Arboriculturist in the Last Year:   Transportation Needs:   . Film/video editor (Medical):   Marland Kitchen Lack of Transportation (Non-Medical):   Physical Activity:   . Days of Exercise per Week:   . Minutes of Exercise per Session:   Stress:   . Feeling of Stress :   Social Connections:   . Frequency of Communication with Friends and Family:   . Frequency of Social Gatherings with Friends and Family:   . Attends Religious Services:   . Active Member of Clubs or Organizations:   . Attends Archivist Meetings:   Marland Kitchen Marital Status:   Intimate Partner Violence:   . Fear of Current or Ex-Partner:   . Emotionally Abused:   Marland Kitchen Physically Abused:   . Sexually Abused:     Past Medical History, Surgical history, Social history, and Family history were reviewed and updated as appropriate.   Please see review of systems for further details on the patient's review from today.   Objective:   Physical Exam:  There were no vitals taken for this visit.  Physical Exam Constitutional:      General: He is not in acute distress.    Appearance: He is well-developed.  Musculoskeletal:        General: No deformity.  Neurological:     Mental Status: He is alert and oriented to person, place, and time.     Motor: No tremor.     Coordination: Coordination normal.     Gait: Gait normal.  Psychiatric:        Attention and Perception: Attention normal. He is attentive. He does not perceive auditory  hallucinations.        Mood and Affect: Mood normal. Mood is not anxious or depressed. Affect is not labile, blunt, angry or inappropriate.        Speech: Speech normal.        Behavior: Behavior normal.        Thought Content: Thought content normal. Thought content does not include homicidal or suicidal ideation. Thought content does not include homicidal or suicidal plan.        Cognition and Memory: Cognition normal.        Judgment: Judgment normal.     Comments: Insight is good.     Lab Review:     Component Value Date/Time   NA 135 08/19/2015 1925   K 3.6 08/19/2015 1925   CL 98 (L) 08/19/2015 1925  CO2 25 08/19/2015 1925   GLUCOSE 89 08/19/2015 1925   BUN 14 08/19/2015 1925   CREATININE 1.15 08/19/2015 1925   CALCIUM 10.0 08/19/2015 1925   GFRNONAA >60 08/19/2015 1925   GFRAA >60 08/19/2015 1925       Component Value Date/Time   WBC 5.0 08/19/2015 1925   RBC 4.86 08/19/2015 1925   HGB 17.1 (H) 08/19/2015 1925   HCT 46.7 08/19/2015 1925   PLT 170 08/19/2015 1925   MCV 96.1 08/19/2015 1925   MCH 35.2 (H) 08/19/2015 1925   MCHC 36.6 (H) 08/19/2015 1925   RDW 12.5 08/19/2015 1925    No results found for: POCLITH, LITHIUM   No results found for: PHENYTOIN, PHENOBARB, VALPROATE, CBMZ   .res Assessment: Plan:    Bipolar II disorder ([place]) - Plan: divalproex (DEPAKOTE ER) 500 MG 24 hr tablet  Insomnia due to mental condition   Disc risk mood cycling even on meds and to call us if this occurs.  Benefit from meds.  Is actually taking 4 Depakote instead of 5 bc better tolerated and doing well.  At one point he took 2500 mg Depakote because of irritability but that has leveled off.  His sleep is improved because of heavy work schedule.Marland Kitchen  He has a history of chronic insomnia.  Because stable for extended period and for cost reasons he has to be seen once yearly.  He asked to defer any labs.  Discussed risks of Depakote to the liver and potentially pancreas.   Protect  sleep bc risk for mood swings.    Continue current meds.  He agrees  Lynder Parents, MD, DFAPA Please see After Visit Summary for patient specific instructions.  No future appointments.  No orders of the defined types were placed in this encounter.     -------------------------------

## 2020-03-13 DIAGNOSIS — Z0000 Encounter for general adult medical examination without abnormal findings: Secondary | ICD-10-CM | POA: Diagnosis not present

## 2020-03-13 DIAGNOSIS — E039 Hypothyroidism, unspecified: Secondary | ICD-10-CM | POA: Diagnosis not present

## 2020-03-13 DIAGNOSIS — Z13220 Encounter for screening for lipoid disorders: Secondary | ICD-10-CM | POA: Diagnosis not present

## 2020-04-20 DIAGNOSIS — M25562 Pain in left knee: Secondary | ICD-10-CM | POA: Diagnosis not present

## 2020-06-15 DIAGNOSIS — M25562 Pain in left knee: Secondary | ICD-10-CM | POA: Diagnosis not present

## 2020-06-26 ENCOUNTER — Other Ambulatory Visit: Payer: Self-pay

## 2020-06-26 DIAGNOSIS — M25562 Pain in left knee: Secondary | ICD-10-CM | POA: Diagnosis not present

## 2020-06-26 MED ORDER — ALPRAZOLAM 0.5 MG PO TABS: 0.5000 mg | ORAL_TABLET | Freq: Every evening | ORAL | 0 refills | Status: DC | PRN

## 2020-07-03 DIAGNOSIS — M25562 Pain in left knee: Secondary | ICD-10-CM | POA: Diagnosis not present

## 2020-07-16 DIAGNOSIS — M25562 Pain in left knee: Secondary | ICD-10-CM | POA: Diagnosis not present

## 2020-07-16 DIAGNOSIS — S83232D Complex tear of medial meniscus, current injury, left knee, subsequent encounter: Secondary | ICD-10-CM | POA: Diagnosis not present

## 2020-08-05 DIAGNOSIS — X58XXXA Exposure to other specified factors, initial encounter: Secondary | ICD-10-CM | POA: Diagnosis not present

## 2020-08-05 DIAGNOSIS — S83232A Complex tear of medial meniscus, current injury, left knee, initial encounter: Secondary | ICD-10-CM | POA: Diagnosis not present

## 2020-08-05 DIAGNOSIS — M94262 Chondromalacia, left knee: Secondary | ICD-10-CM | POA: Diagnosis not present

## 2020-08-05 DIAGNOSIS — M659 Synovitis and tenosynovitis, unspecified: Secondary | ICD-10-CM | POA: Diagnosis not present

## 2020-08-05 DIAGNOSIS — S83272A Complex tear of lateral meniscus, current injury, left knee, initial encounter: Secondary | ICD-10-CM | POA: Diagnosis not present

## 2020-08-05 DIAGNOSIS — Y999 Unspecified external cause status: Secondary | ICD-10-CM | POA: Diagnosis not present

## 2020-08-05 DIAGNOSIS — G8918 Other acute postprocedural pain: Secondary | ICD-10-CM | POA: Diagnosis not present

## 2020-08-14 DIAGNOSIS — Z4789 Encounter for other orthopedic aftercare: Secondary | ICD-10-CM | POA: Diagnosis not present

## 2020-08-14 DIAGNOSIS — M6281 Muscle weakness (generalized): Secondary | ICD-10-CM | POA: Diagnosis not present

## 2020-08-14 DIAGNOSIS — M25562 Pain in left knee: Secondary | ICD-10-CM | POA: Diagnosis not present

## 2020-08-17 DIAGNOSIS — Z4789 Encounter for other orthopedic aftercare: Secondary | ICD-10-CM | POA: Diagnosis not present

## 2020-08-17 DIAGNOSIS — M25562 Pain in left knee: Secondary | ICD-10-CM | POA: Diagnosis not present

## 2020-08-17 DIAGNOSIS — M6281 Muscle weakness (generalized): Secondary | ICD-10-CM | POA: Diagnosis not present

## 2020-08-19 DIAGNOSIS — Z4789 Encounter for other orthopedic aftercare: Secondary | ICD-10-CM | POA: Diagnosis not present

## 2020-08-19 DIAGNOSIS — M6281 Muscle weakness (generalized): Secondary | ICD-10-CM | POA: Diagnosis not present

## 2020-08-19 DIAGNOSIS — M25562 Pain in left knee: Secondary | ICD-10-CM | POA: Diagnosis not present

## 2020-08-24 DIAGNOSIS — M25562 Pain in left knee: Secondary | ICD-10-CM | POA: Diagnosis not present

## 2020-08-24 DIAGNOSIS — Z4789 Encounter for other orthopedic aftercare: Secondary | ICD-10-CM | POA: Diagnosis not present

## 2020-08-24 DIAGNOSIS — M6281 Muscle weakness (generalized): Secondary | ICD-10-CM | POA: Diagnosis not present

## 2020-08-26 DIAGNOSIS — M25562 Pain in left knee: Secondary | ICD-10-CM | POA: Diagnosis not present

## 2020-08-26 DIAGNOSIS — Z4789 Encounter for other orthopedic aftercare: Secondary | ICD-10-CM | POA: Diagnosis not present

## 2020-08-26 DIAGNOSIS — M6281 Muscle weakness (generalized): Secondary | ICD-10-CM | POA: Diagnosis not present

## 2020-08-31 DIAGNOSIS — M6281 Muscle weakness (generalized): Secondary | ICD-10-CM | POA: Diagnosis not present

## 2020-08-31 DIAGNOSIS — M25562 Pain in left knee: Secondary | ICD-10-CM | POA: Diagnosis not present

## 2020-08-31 DIAGNOSIS — Z4789 Encounter for other orthopedic aftercare: Secondary | ICD-10-CM | POA: Diagnosis not present

## 2020-09-02 DIAGNOSIS — M6281 Muscle weakness (generalized): Secondary | ICD-10-CM | POA: Diagnosis not present

## 2020-09-02 DIAGNOSIS — Z4789 Encounter for other orthopedic aftercare: Secondary | ICD-10-CM | POA: Diagnosis not present

## 2020-09-02 DIAGNOSIS — M25562 Pain in left knee: Secondary | ICD-10-CM | POA: Diagnosis not present

## 2020-09-10 DIAGNOSIS — M6281 Muscle weakness (generalized): Secondary | ICD-10-CM | POA: Diagnosis not present

## 2020-09-10 DIAGNOSIS — Z4789 Encounter for other orthopedic aftercare: Secondary | ICD-10-CM | POA: Diagnosis not present

## 2020-09-10 DIAGNOSIS — M25562 Pain in left knee: Secondary | ICD-10-CM | POA: Diagnosis not present

## 2020-09-11 DIAGNOSIS — M6281 Muscle weakness (generalized): Secondary | ICD-10-CM | POA: Diagnosis not present

## 2020-09-11 DIAGNOSIS — Z4789 Encounter for other orthopedic aftercare: Secondary | ICD-10-CM | POA: Diagnosis not present

## 2020-09-11 DIAGNOSIS — M25562 Pain in left knee: Secondary | ICD-10-CM | POA: Diagnosis not present

## 2020-09-15 DIAGNOSIS — Z4789 Encounter for other orthopedic aftercare: Secondary | ICD-10-CM | POA: Diagnosis not present

## 2020-09-15 DIAGNOSIS — M25562 Pain in left knee: Secondary | ICD-10-CM | POA: Diagnosis not present

## 2020-09-15 DIAGNOSIS — M6281 Muscle weakness (generalized): Secondary | ICD-10-CM | POA: Diagnosis not present

## 2020-09-18 DIAGNOSIS — Z4789 Encounter for other orthopedic aftercare: Secondary | ICD-10-CM | POA: Diagnosis not present

## 2020-09-18 DIAGNOSIS — M25562 Pain in left knee: Secondary | ICD-10-CM | POA: Diagnosis not present

## 2020-09-18 DIAGNOSIS — M6281 Muscle weakness (generalized): Secondary | ICD-10-CM | POA: Diagnosis not present

## 2020-09-23 DIAGNOSIS — M6281 Muscle weakness (generalized): Secondary | ICD-10-CM | POA: Diagnosis not present

## 2020-09-23 DIAGNOSIS — Z4789 Encounter for other orthopedic aftercare: Secondary | ICD-10-CM | POA: Diagnosis not present

## 2020-09-23 DIAGNOSIS — M25562 Pain in left knee: Secondary | ICD-10-CM | POA: Diagnosis not present

## 2020-09-25 DIAGNOSIS — M25562 Pain in left knee: Secondary | ICD-10-CM | POA: Diagnosis not present

## 2020-09-25 DIAGNOSIS — M6281 Muscle weakness (generalized): Secondary | ICD-10-CM | POA: Diagnosis not present

## 2020-09-25 DIAGNOSIS — Z4789 Encounter for other orthopedic aftercare: Secondary | ICD-10-CM | POA: Diagnosis not present

## 2020-09-30 DIAGNOSIS — Z4789 Encounter for other orthopedic aftercare: Secondary | ICD-10-CM | POA: Diagnosis not present

## 2020-09-30 DIAGNOSIS — M25562 Pain in left knee: Secondary | ICD-10-CM | POA: Diagnosis not present

## 2020-09-30 DIAGNOSIS — M6281 Muscle weakness (generalized): Secondary | ICD-10-CM | POA: Diagnosis not present

## 2020-10-02 DIAGNOSIS — Z4789 Encounter for other orthopedic aftercare: Secondary | ICD-10-CM | POA: Diagnosis not present

## 2020-10-02 DIAGNOSIS — M6281 Muscle weakness (generalized): Secondary | ICD-10-CM | POA: Diagnosis not present

## 2020-10-02 DIAGNOSIS — M25562 Pain in left knee: Secondary | ICD-10-CM | POA: Diagnosis not present

## 2020-10-07 DIAGNOSIS — M25562 Pain in left knee: Secondary | ICD-10-CM | POA: Diagnosis not present

## 2020-10-07 DIAGNOSIS — Z4789 Encounter for other orthopedic aftercare: Secondary | ICD-10-CM | POA: Diagnosis not present

## 2020-10-07 DIAGNOSIS — M6281 Muscle weakness (generalized): Secondary | ICD-10-CM | POA: Diagnosis not present

## 2020-10-09 DIAGNOSIS — Z4789 Encounter for other orthopedic aftercare: Secondary | ICD-10-CM | POA: Diagnosis not present

## 2020-10-09 DIAGNOSIS — M6281 Muscle weakness (generalized): Secondary | ICD-10-CM | POA: Diagnosis not present

## 2020-10-09 DIAGNOSIS — M25562 Pain in left knee: Secondary | ICD-10-CM | POA: Diagnosis not present

## 2021-03-14 ENCOUNTER — Other Ambulatory Visit: Payer: Self-pay | Admitting: Psychiatry

## 2021-03-14 DIAGNOSIS — F3181 Bipolar II disorder: Secondary | ICD-10-CM

## 2021-03-16 NOTE — Telephone Encounter (Signed)
Please review

## 2021-03-26 DIAGNOSIS — R030 Elevated blood-pressure reading, without diagnosis of hypertension: Secondary | ICD-10-CM | POA: Diagnosis not present

## 2021-03-26 DIAGNOSIS — E039 Hypothyroidism, unspecified: Secondary | ICD-10-CM | POA: Diagnosis not present

## 2021-03-26 DIAGNOSIS — Z0000 Encounter for general adult medical examination without abnormal findings: Secondary | ICD-10-CM | POA: Diagnosis not present

## 2021-03-26 DIAGNOSIS — R7301 Impaired fasting glucose: Secondary | ICD-10-CM | POA: Diagnosis not present

## 2021-03-26 DIAGNOSIS — Z13220 Encounter for screening for lipoid disorders: Secondary | ICD-10-CM | POA: Diagnosis not present

## 2021-05-24 ENCOUNTER — Encounter: Payer: Self-pay | Admitting: Psychiatry

## 2021-05-24 ENCOUNTER — Other Ambulatory Visit: Payer: Self-pay

## 2021-05-24 ENCOUNTER — Ambulatory Visit (INDEPENDENT_AMBULATORY_CARE_PROVIDER_SITE_OTHER): Payer: BC Managed Care – PPO | Admitting: Psychiatry

## 2021-05-24 DIAGNOSIS — G4726 Circadian rhythm sleep disorder, shift work type: Secondary | ICD-10-CM | POA: Diagnosis not present

## 2021-05-24 DIAGNOSIS — F5105 Insomnia due to other mental disorder: Secondary | ICD-10-CM | POA: Diagnosis not present

## 2021-05-24 DIAGNOSIS — F3181 Bipolar II disorder: Secondary | ICD-10-CM

## 2021-05-24 NOTE — Progress Notes (Signed)
MAKAVELI HOARD [id number] 03-13-1966 55 y.o.   Subjective:   Patient ID:  Dean [name] is a 55 y.o. (DOB 1966/11/17) male.  Chief Complaint:  Chief Complaint  Patient presents with   Follow-up   Bipolar II disorder ([place])   Anxiety    HPI Dean [name] presents today for follow-up of bipolar disorder and insomnia..  05/24/21 appt noted:  No meds were changed. Depakote ER 2500 mg daily.  Rare Xanax. Pretty good.  No unusual mood swings.  Anger in check. Restless sleep and no Xanax bc hangover. Initial and terminal insomnia. Avrage in bed 230 to 1230.  2nd shift. No caffeine.  Patient reports stable mood and denies depressed or irritable moods.  Patient denies any recent difficulty with anxiety. Denies appetite disturbance.  Patient reports that energy and motivation have been good.  Patient denies any difficulty with concentration.  Patient denies any suicidal ideation.  Past Psychiatric Medication Trials: Depakote 2500 mg,  buspirone 15 twice daily, Lexapro 10, citalopram 20, Xanax XR, alprazolam hangover, Ambien ? effect,  hydroxyzine, trazodone  Review of Systems:  Review of Systems  Musculoskeletal:  Positive for arthralgias.  Neurological:  Negative for tremors and weakness.  Psychiatric/Behavioral:  Positive for sleep disturbance. Negative for agitation, behavioral problems, confusion, decreased concentration, dysphoric mood, hallucinations, self-injury and suicidal ideas. The patient is not nervous/anxious and is not hyperactive.    Medications: I have reviewed the patient's current medications.  Current Outpatient Medications  Medication Sig Dispense Refill   ALPRAZolam (XANAX) 0.5 MG tablet Take 1 tablet (0.5 mg total) by mouth at bedtime as needed for anxiety. 30 tablet 0   divalproex (DEPAKOTE ER) 500 MG 24 hr tablet TAKE 5 TABLETS (2,500 MG TOTAL) BY MOUTH DAILY. 450 tablet 3   levothyroxine (SYNTHROID) 137 MCG tablet Take 137 mcg by mouth  daily.     Multiple Vitamins-Minerals (ONE DAILY MENS 50+ MULTIVIT) TABS Take by mouth.     pantoprazole (PROTONIX) 40 MG tablet Take 40 mg by mouth daily.     No current facility-administered medications for this visit.    Medication Side Effects: None  Allergies:  Allergies  Allergen Reactions   Erythromycin Nausea Only   Ibuprofen Other (See Comments)    Peptic Ulcer    Past Medical History:  Diagnosis Date   Bipolar 1 disorder ([place])    Thyroid disease     History reviewed. No pertinent family history.  Social History   Socioeconomic History   Marital status: Married    Spouse name: Not on file   Number of children: Not on file   Years of education: Not on file   Highest education level: Not on file  Occupational History   Not on file  Tobacco Use   Smoking status: Former    Pack years: 0.00   Smokeless tobacco: Never  Substance and Sexual Activity   Alcohol use: Yes   Drug use: No   Sexual activity: Not on file  Other Topics Concern   Not on file  Social History Narrative   Not on file   Social Determinants of Health   Financial Resource Strain: Not on file  Food Insecurity: Not on file  Transportation Needs: Not on file  Physical Activity: Not on file  Stress: Not on file  Social Connections: Not on file  Intimate Partner Violence: Not on file    Past Medical History, Surgical history, Social history, and Family history were reviewed and updated as appropriate.  Please see review of systems for further details on the patient's review from today.   Objective:   Physical Exam:  There were no vitals taken for this visit.  Physical Exam Constitutional:      General: He is not in acute distress. Musculoskeletal:        General: No deformity.  Neurological:     Mental Status: He is alert and oriented to person, place, and time.     Coordination: Coordination normal.  Psychiatric:        Attention and Perception: Attention and perception  normal. He does not perceive auditory or visual hallucinations.        Mood and Affect: Mood normal. Mood is not anxious or depressed. Affect is not labile, blunt, angry or inappropriate.        Speech: Speech normal.        Behavior: Behavior normal.        Thought Content: Thought content normal. Thought content is not paranoid or delusional. Thought content does not include homicidal or suicidal ideation. Thought content does not include homicidal or suicidal plan.        Cognition and Memory: Cognition and memory normal.        Judgment: Judgment normal.     Comments: Insight intact    Lab Review:     Component Value Date/Time   NA 135 08/19/2015 1925   K 3.6 08/19/2015 1925   CL 98 (L) 08/19/2015 1925   CO2 25 08/19/2015 1925   GLUCOSE 89 08/19/2015 1925   BUN 14 08/19/2015 1925   CREATININE 1.15 08/19/2015 1925   CALCIUM 10.0 08/19/2015 1925   GFRNONAA >60 08/19/2015 1925   GFRAA >60 08/19/2015 1925       Component Value Date/Time   WBC 5.0 08/19/2015 1925   RBC 4.86 08/19/2015 1925   HGB 17.1 (H) 08/19/2015 1925   HCT 46.7 08/19/2015 1925   PLT 170 08/19/2015 1925   MCV 96.1 08/19/2015 1925   MCH 35.2 (H) 08/19/2015 1925   MCHC 36.6 (H) 08/19/2015 1925   RDW 12.5 08/19/2015 1925    No results found for: POCLITH, LITHIUM   No results found for: PHENYTOIN, PHENOBARB, VALPROATE, CBMZ   .res Assessment: Plan:    Bipolar II disorder ([place])  Insomnia due to mental condition  Shift work sleep disorder   Disc risk mood cycling even on meds and to call us if this occurs.  Benefit from meds.  Is actually taking 4 Depakote instead of 5 bc better tolerated and doing well.  At one point he took 2500 mg Depakote because of irritability but that has leveled off.  His sleep is improved because of heavy work schedule.Marland Kitchen  He has a history of chronic insomnia.  Because stable for extended period and for cost reasons he has to be seen once yearly.  He asked to defer any labs.   Discussed risks of Depakote to the liver and potentially pancreas.   Protect sleep bc risk for mood swings.  Extensive discussion sleep hygiene esp restriction bc in bed too much.  Disc dx shift work sleep disorder. Disc option sleep meds.  He's sensitive to xanax hangover.  Option other bz or non-bz.  We discussed the short-term risks associated with benzodiazepines including sedation and increased fall risk among others.  Discussed long-term side effect risk including dependence, potential withdrawal symptoms, and the potential eventual dose-related risk of dementia.  But recent studies from 2020 dispute this association between benzodiazepines and  dementia risk. Newer studies in 2020 do not support an association with dementia. He wants to avoid more meds.  Continue current meds.  He agrees  FU 6 mos  Lynder Parents, MD, DFAPA Please see After Visit Summary for patient specific instructions.  No future appointments.   No orders of the defined types were placed in this encounter.     -------------------------------

## 2021-05-30 DIAGNOSIS — M79641 Pain in right hand: Secondary | ICD-10-CM | POA: Diagnosis not present

## 2021-05-30 DIAGNOSIS — M654 Radial styloid tenosynovitis [de Quervain]: Secondary | ICD-10-CM | POA: Diagnosis not present

## 2021-06-01 ENCOUNTER — Other Ambulatory Visit: Payer: Self-pay

## 2021-06-02 MED ORDER — ALPRAZOLAM 0.5 MG PO TABS: 0.5000 mg | ORAL_TABLET | Freq: Every evening | ORAL | 5 refills | Status: DC | PRN

## 2021-07-09 DIAGNOSIS — L255 Unspecified contact dermatitis due to plants, except food: Secondary | ICD-10-CM | POA: Diagnosis not present

## 2021-09-15 ENCOUNTER — Other Ambulatory Visit: Payer: Self-pay | Admitting: Family Medicine

## 2021-09-15 ENCOUNTER — Telehealth: Payer: Self-pay | Admitting: Psychiatry

## 2021-09-15 DIAGNOSIS — R7401 Elevation of levels of liver transaminase levels: Secondary | ICD-10-CM | POA: Diagnosis not present

## 2021-09-15 DIAGNOSIS — R17 Unspecified jaundice: Secondary | ICD-10-CM | POA: Diagnosis not present

## 2021-09-15 DIAGNOSIS — E039 Hypothyroidism, unspecified: Secondary | ICD-10-CM | POA: Diagnosis not present

## 2021-09-15 DIAGNOSIS — D7589 Other specified diseases of blood and blood-forming organs: Secondary | ICD-10-CM | POA: Diagnosis not present

## 2021-09-15 DIAGNOSIS — R7989 Other specified abnormal findings of blood chemistry: Secondary | ICD-10-CM | POA: Diagnosis not present

## 2021-09-15 DIAGNOSIS — Z79899 Other long term (current) drug therapy: Secondary | ICD-10-CM | POA: Diagnosis not present

## 2021-09-15 NOTE — Telephone Encounter (Signed)
Pt called to report Dr. Caren Macadam @ [place] [facility] has ordered labs and urgent ultra sound on liver due to liver enzymes. Concerned may be Depakote. Pt stated he has been taking since 2001. Pt's contact # [phone] Asking if he needs to change any meds or wait for results? Apt 12/19

## 2021-09-16 ENCOUNTER — Ambulatory Visit
Admission: RE | Admit: 2021-09-16 | Discharge: 2021-09-16 | Disposition: A | Discharge status: Home / Self Care | Payer: Self-pay | Source: Ambulatory Visit | Attending: Family Medicine | Admitting: Family Medicine

## 2021-09-16 DIAGNOSIS — K828 Other specified diseases of gallbladder: Secondary | ICD-10-CM | POA: Diagnosis not present

## 2021-09-16 DIAGNOSIS — R7401 Elevation of levels of liver transaminase levels: Secondary | ICD-10-CM

## 2021-09-16 NOTE — Telephone Encounter (Signed)
I assume no changes until results from ultrasound and labs?  His next apt is in Dec.

## 2021-09-17 NOTE — Telephone Encounter (Signed)
The purpose of the test being done is to rule out other causes besides Depakote for elevations in liver enzymes.  His doctor will tell him when that is done and if we should consider changing the Depakote.  We should not change the Depakote until this laboratory work-up is complete.  Tell him to have his doctor send the lab results to [place] since I cannot see lab results associated with [name] in [facility]

## 2021-09-20 ENCOUNTER — Encounter: Payer: Self-pay | Admitting: Internal Medicine

## 2021-09-20 DIAGNOSIS — R17 Unspecified jaundice: Secondary | ICD-10-CM | POA: Diagnosis not present

## 2021-09-20 DIAGNOSIS — R7989 Other specified abnormal findings of blood chemistry: Secondary | ICD-10-CM | POA: Diagnosis not present

## 2021-09-20 DIAGNOSIS — R748 Abnormal levels of other serum enzymes: Secondary | ICD-10-CM | POA: Diagnosis not present

## 2021-09-20 NOTE — Telephone Encounter (Signed)
Please call the pt and give him the message from dr cottle.Send any response from the pt to dr cottle if he has questions when you call.

## 2021-09-20 NOTE — Telephone Encounter (Signed)
Called patient with information. He stated tests have been done but still waiting on results. He stated his MD would sent results when available.  He had no further questions.

## 2021-09-24 ENCOUNTER — Other Ambulatory Visit: Payer: Self-pay | Admitting: Gastroenterology

## 2021-09-24 DIAGNOSIS — R7989 Other specified abnormal findings of blood chemistry: Secondary | ICD-10-CM

## 2021-09-24 DIAGNOSIS — R17 Unspecified jaundice: Secondary | ICD-10-CM

## 2021-09-24 DIAGNOSIS — R945 Abnormal results of liver function studies: Secondary | ICD-10-CM | POA: Diagnosis not present

## 2021-09-27 ENCOUNTER — Ambulatory Visit
Admission: RE | Admit: 2021-09-27 | Discharge: 2021-09-27 | Disposition: A | Discharge status: Home / Self Care | Payer: BC Managed Care – PPO | Source: Ambulatory Visit | Attending: Gastroenterology | Admitting: Gastroenterology

## 2021-09-27 DIAGNOSIS — R17 Unspecified jaundice: Secondary | ICD-10-CM

## 2021-09-27 DIAGNOSIS — R7989 Other specified abnormal findings of blood chemistry: Secondary | ICD-10-CM

## 2021-09-27 DIAGNOSIS — K838 Other specified diseases of biliary tract: Secondary | ICD-10-CM | POA: Diagnosis not present

## 2021-09-27 DIAGNOSIS — K828 Other specified diseases of gallbladder: Secondary | ICD-10-CM | POA: Diagnosis not present

## 2021-09-27 MED ORDER — GADOBENATE DIMEGLUMINE 529 MG/ML IV SOLN
15.0000 mL | Freq: Once | INTRAVENOUS | Status: AC | PRN
  Administered 2021-09-27: 15 mL via INTRAVENOUS

## 2021-09-30 ENCOUNTER — Other Ambulatory Visit: Payer: Self-pay | Admitting: Gastroenterology

## 2021-10-01 DIAGNOSIS — R935 Abnormal findings on diagnostic imaging of other abdominal regions, including retroperitoneum: Secondary | ICD-10-CM | POA: Diagnosis not present

## 2021-10-01 DIAGNOSIS — R17 Unspecified jaundice: Secondary | ICD-10-CM | POA: Diagnosis not present

## 2021-10-03 NOTE — Anesthesia Preprocedure Evaluation (Addendum)
Anesthesia Evaluation  Patient identified by MRN, date of birth, ID band Patient awake    Reviewed: Allergy & Precautions, NPO status , Patient's Chart, lab work & pertinent test results  History of Anesthesia Complications Negative for: history of anesthetic complications  Airway Mallampati: II  TM Distance: >3 FB Neck ROM: Full    Dental  (+) Dental Advisory Given   Pulmonary neg pulmonary ROS, former smoker,    Pulmonary exam normal        Cardiovascular negative cardio ROS Normal cardiovascular exam     Neuro/Psych PSYCHIATRIC DISORDERS Bipolar Disorder negative neurological ROS     GI/Hepatic Neg liver ROS,   Endo/Other  Hyperthyroidism   Renal/GU negative Renal ROS     Musculoskeletal negative musculoskeletal ROS (+)   Abdominal   Peds  Hematology negative hematology ROS (+)   Anesthesia Other Findings   Reproductive/Obstetrics                            Anesthesia Physical Anesthesia Plan  ASA: 2  Anesthesia Plan:    Post-op Pain Management:    Induction: Intravenous  PONV Risk Score and Plan: 2 and Ondansetron and Midazolam  Airway Management Planned: Oral ETT  Additional Equipment:   Intra-op Plan:   Post-operative Plan: Extubation in OR  Informed Consent: I have reviewed the patients History and Physical, chart, labs and discussed the procedure including the risks, benefits and alternatives for the proposed anesthesia with the patient or authorized representative who has indicated his/her understanding and acceptance.     Dental advisory given  Plan Discussed with: Anesthesiologist and CRNA  Anesthesia Plan Comments:        Anesthesia Quick Evaluation

## 2021-10-04 ENCOUNTER — Ambulatory Visit (HOSPITAL_COMMUNITY): Payer: BC Managed Care – PPO

## 2021-10-04 ENCOUNTER — Ambulatory Visit (HOSPITAL_COMMUNITY): Payer: BC Managed Care – PPO | Admitting: Anesthesiology

## 2021-10-04 ENCOUNTER — Other Ambulatory Visit: Payer: Self-pay

## 2021-10-04 ENCOUNTER — Encounter (HOSPITAL_COMMUNITY): Payer: Self-pay | Admitting: Emergency Medicine

## 2021-10-04 ENCOUNTER — Emergency Department (HOSPITAL_COMMUNITY): Payer: BC Managed Care – PPO

## 2021-10-04 ENCOUNTER — Ambulatory Visit (HOSPITAL_BASED_OUTPATIENT_CLINIC_OR_DEPARTMENT_OTHER)
Admission: RE | Admit: 2021-10-04 | Discharge: 2021-10-04 | Disposition: A | Discharge status: Home / Self Care | Payer: BC Managed Care – PPO | Source: Ambulatory Visit | Attending: Gastroenterology | Admitting: Gastroenterology

## 2021-10-04 ENCOUNTER — Encounter (HOSPITAL_COMMUNITY)
Admission: RE | Disposition: A | Discharge status: Home / Self Care | Payer: Self-pay | Source: Ambulatory Visit | Attending: Gastroenterology

## 2021-10-04 ENCOUNTER — Inpatient Hospital Stay (HOSPITAL_COMMUNITY)
Admission: EM | Admit: 2021-10-04 | Discharge: 2021-10-11 | DRG: 438 | Disposition: A | Discharge status: Home / Self Care | Payer: BC Managed Care – PPO | Attending: Internal Medicine | Admitting: Internal Medicine

## 2021-10-04 ENCOUNTER — Encounter (HOSPITAL_COMMUNITY): Payer: Self-pay | Admitting: Gastroenterology

## 2021-10-04 DIAGNOSIS — K8689 Other specified diseases of pancreas: Secondary | ICD-10-CM | POA: Diagnosis not present

## 2021-10-04 DIAGNOSIS — F319 Bipolar disorder, unspecified: Secondary | ICD-10-CM | POA: Diagnosis not present

## 2021-10-04 DIAGNOSIS — R739 Hyperglycemia, unspecified: Secondary | ICD-10-CM | POA: Diagnosis not present

## 2021-10-04 DIAGNOSIS — D490 Neoplasm of unspecified behavior of digestive system: Secondary | ICD-10-CM

## 2021-10-04 DIAGNOSIS — K831 Obstruction of bile duct: Secondary | ICD-10-CM

## 2021-10-04 DIAGNOSIS — Z9889 Other specified postprocedural states: Secondary | ICD-10-CM | POA: Diagnosis not present

## 2021-10-04 DIAGNOSIS — R109 Unspecified abdominal pain: Secondary | ICD-10-CM | POA: Diagnosis not present

## 2021-10-04 DIAGNOSIS — K8591 Acute pancreatitis with uninfected necrosis, unspecified: Secondary | ICD-10-CM | POA: Diagnosis not present

## 2021-10-04 DIAGNOSIS — K838 Other specified diseases of biliary tract: Secondary | ICD-10-CM | POA: Diagnosis not present

## 2021-10-04 DIAGNOSIS — E878 Other disorders of electrolyte and fluid balance, not elsewhere classified: Secondary | ICD-10-CM | POA: Diagnosis not present

## 2021-10-04 DIAGNOSIS — E43 Unspecified severe protein-calorie malnutrition: Secondary | ICD-10-CM | POA: Diagnosis not present

## 2021-10-04 DIAGNOSIS — E871 Hypo-osmolality and hyponatremia: Secondary | ICD-10-CM | POA: Diagnosis not present

## 2021-10-04 DIAGNOSIS — Z87891 Personal history of nicotine dependence: Secondary | ICD-10-CM

## 2021-10-04 DIAGNOSIS — R935 Abnormal findings on diagnostic imaging of other abdominal regions, including retroperitoneum: Secondary | ICD-10-CM | POA: Diagnosis not present

## 2021-10-04 DIAGNOSIS — D638 Anemia in other chronic diseases classified elsewhere: Secondary | ICD-10-CM | POA: Diagnosis not present

## 2021-10-04 DIAGNOSIS — Z79899 Other long term (current) drug therapy: Secondary | ICD-10-CM | POA: Diagnosis not present

## 2021-10-04 DIAGNOSIS — Z4659 Encounter for fitting and adjustment of other gastrointestinal appliance and device: Secondary | ICD-10-CM

## 2021-10-04 DIAGNOSIS — Z881 Allergy status to other antibiotic agents status: Secondary | ICD-10-CM | POA: Insufficient documentation

## 2021-10-04 DIAGNOSIS — R17 Unspecified jaundice: Secondary | ICD-10-CM | POA: Diagnosis present

## 2021-10-04 DIAGNOSIS — E872 Acidosis, unspecified: Secondary | ICD-10-CM | POA: Diagnosis present

## 2021-10-04 DIAGNOSIS — E039 Hypothyroidism, unspecified: Secondary | ICD-10-CM | POA: Diagnosis present

## 2021-10-04 DIAGNOSIS — Z79890 Hormone replacement therapy (postmenopausal): Secondary | ICD-10-CM

## 2021-10-04 DIAGNOSIS — K5900 Constipation, unspecified: Secondary | ICD-10-CM | POA: Diagnosis not present

## 2021-10-04 DIAGNOSIS — I899 Noninfective disorder of lymphatic vessels and lymph nodes, unspecified: Secondary | ICD-10-CM | POA: Diagnosis not present

## 2021-10-04 DIAGNOSIS — Z6824 Body mass index (BMI) 24.0-24.9, adult: Secondary | ICD-10-CM | POA: Diagnosis not present

## 2021-10-04 DIAGNOSIS — K869 Disease of pancreas, unspecified: Secondary | ICD-10-CM

## 2021-10-04 DIAGNOSIS — I700 Atherosclerosis of aorta: Secondary | ICD-10-CM | POA: Diagnosis not present

## 2021-10-04 DIAGNOSIS — Z886 Allergy status to analgesic agent status: Secondary | ICD-10-CM | POA: Insufficient documentation

## 2021-10-04 DIAGNOSIS — R7401 Elevation of levels of liver transaminase levels: Secondary | ICD-10-CM | POA: Diagnosis not present

## 2021-10-04 DIAGNOSIS — E441 Mild protein-calorie malnutrition: Secondary | ICD-10-CM | POA: Diagnosis present

## 2021-10-04 DIAGNOSIS — Z20822 Contact with and (suspected) exposure to covid-19: Secondary | ICD-10-CM | POA: Diagnosis present

## 2021-10-04 DIAGNOSIS — R933 Abnormal findings on diagnostic imaging of other parts of digestive tract: Secondary | ICD-10-CM | POA: Insufficient documentation

## 2021-10-04 DIAGNOSIS — K858 Other acute pancreatitis without necrosis or infection: Secondary | ICD-10-CM | POA: Diagnosis not present

## 2021-10-04 DIAGNOSIS — K859 Acute pancreatitis without necrosis or infection, unspecified: Secondary | ICD-10-CM | POA: Diagnosis not present

## 2021-10-04 DIAGNOSIS — R1013 Epigastric pain: Secondary | ICD-10-CM | POA: Diagnosis not present

## 2021-10-04 DIAGNOSIS — K3189 Other diseases of stomach and duodenum: Secondary | ICD-10-CM

## 2021-10-04 DIAGNOSIS — R932 Abnormal findings on diagnostic imaging of liver and biliary tract: Secondary | ICD-10-CM | POA: Diagnosis not present

## 2021-10-04 DIAGNOSIS — R9389 Abnormal findings on diagnostic imaging of other specified body structures: Secondary | ICD-10-CM | POA: Diagnosis not present

## 2021-10-04 HISTORY — PX: PANCREATIC STENT PLACEMENT: SHX5539

## 2021-10-04 HISTORY — PX: ESOPHAGOGASTRODUODENOSCOPY (EGD) WITH PROPOFOL: SHX5813

## 2021-10-04 HISTORY — PX: SPHINCTEROTOMY: SHX5279

## 2021-10-04 HISTORY — PX: FINE NEEDLE ASPIRATION: SHX5430

## 2021-10-04 HISTORY — PX: STENT REMOVAL: SHX6421

## 2021-10-04 HISTORY — PX: ERCP: SHX5425

## 2021-10-04 HISTORY — PX: BILIARY BRUSHING: SHX6843

## 2021-10-04 HISTORY — PX: BILIARY STENT PLACEMENT: SHX5538

## 2021-10-04 HISTORY — PX: UPPER ESOPHAGEAL ENDOSCOPIC ULTRASOUND (EUS): SHX6562

## 2021-10-04 HISTORY — PX: FOREIGN BODY REMOVAL: SHX962

## 2021-10-04 SURGERY — ERCP, WITH INTERVENTION IF INDICATED
Anesthesia: General

## 2021-10-04 SURGERY — ESOPHAGOGASTRODUODENOSCOPY (EGD) WITH PROPOFOL
Anesthesia: Monitor Anesthesia Care

## 2021-10-04 MED ORDER — FENTANYL CITRATE (PF) 100 MCG/2ML IJ SOLN
INTRAMUSCULAR | Status: AC
  Filled 2021-10-04: qty 2

## 2021-10-04 MED ORDER — LACTATED RINGERS IV SOLN: INTRAVENOUS | Status: DC | PRN

## 2021-10-04 MED ORDER — DEXAMETHASONE SODIUM PHOSPHATE 10 MG/ML IJ SOLN
INTRAMUSCULAR | Status: DC | PRN
  Administered 2021-10-04: 10 mg via INTRAVENOUS

## 2021-10-04 MED ORDER — SODIUM CHLORIDE 0.9 % IV SOLN: INTRAVENOUS | Status: DC

## 2021-10-04 MED ORDER — INDOMETHACIN 50 MG RE SUPP
RECTAL | Status: DC | PRN
  Administered 2021-10-04: 100 mg via RECTAL

## 2021-10-04 MED ORDER — PROPOFOL 10 MG/ML IV BOLUS
INTRAVENOUS | Status: DC | PRN
  Administered 2021-10-04: 180 mg via INTRAVENOUS

## 2021-10-04 MED ORDER — SODIUM CHLORIDE 0.9 % IV SOLN
INTRAVENOUS | Status: DC | PRN
  Administered 2021-10-04: 18 mL

## 2021-10-04 MED ORDER — ROCURONIUM BROMIDE 10 MG/ML (PF) SYRINGE
PREFILLED_SYRINGE | INTRAVENOUS | Status: DC | PRN
  Administered 2021-10-04: 80 mg via INTRAVENOUS

## 2021-10-04 MED ORDER — HYDROMORPHONE HCL 1 MG/ML IJ SOLN
1.0000 mg | Freq: Once | INTRAMUSCULAR | Status: AC
  Administered 2021-10-04: 1 mg via INTRAVENOUS
  Filled 2021-10-04: qty 1

## 2021-10-04 MED ORDER — SODIUM CHLORIDE 0.9 % IV BOLUS
1000.0000 mL | Freq: Once | INTRAVENOUS | Status: AC
  Administered 2021-10-04: 1000 mL via INTRAVENOUS

## 2021-10-04 MED ORDER — MIDAZOLAM HCL 2 MG/2ML IJ SOLN
INTRAMUSCULAR | Status: AC
  Filled 2021-10-04: qty 2

## 2021-10-04 MED ORDER — SUGAMMADEX SODIUM 200 MG/2ML IV SOLN
INTRAVENOUS | Status: DC | PRN
  Administered 2021-10-04: 200 mg via INTRAVENOUS

## 2021-10-04 MED ORDER — SODIUM CHLORIDE 0.9 % IV SOLN: INTRAVENOUS | Status: DC | PRN

## 2021-10-04 MED ORDER — ONDANSETRON HCL 4 MG/2ML IJ SOLN
4.0000 mg | Freq: Once | INTRAMUSCULAR | Status: AC
  Administered 2021-10-04: 4 mg via INTRAVENOUS
  Filled 2021-10-04: qty 2

## 2021-10-04 MED ORDER — ONDANSETRON HCL 4 MG/2ML IJ SOLN
INTRAMUSCULAR | Status: DC | PRN
  Administered 2021-10-04: 4 mg via INTRAVENOUS

## 2021-10-04 MED ORDER — GLUCAGON HCL RDNA (DIAGNOSTIC) 1 MG IJ SOLR
INTRAMUSCULAR | Status: DC | PRN
  Administered 2021-10-04: .5 mg via INTRAVENOUS

## 2021-10-04 MED ORDER — LIDOCAINE 2% (20 MG/ML) 5 ML SYRINGE
INTRAMUSCULAR | Status: DC | PRN
  Administered 2021-10-04: 100 mg via INTRAVENOUS

## 2021-10-04 MED ORDER — CIPROFLOXACIN IN D5W 400 MG/200ML IV SOLN
INTRAVENOUS | Status: AC
  Filled 2021-10-04: qty 200

## 2021-10-04 MED ORDER — INDOMETHACIN 50 MG RE SUPP
RECTAL | Status: AC
  Filled 2021-10-04: qty 2

## 2021-10-04 MED ORDER — FENTANYL CITRATE (PF) 250 MCG/5ML IJ SOLN
INTRAMUSCULAR | Status: DC | PRN
  Administered 2021-10-04 (×2): 50 ug via INTRAVENOUS

## 2021-10-04 MED ORDER — MIDAZOLAM HCL 5 MG/5ML IJ SOLN
INTRAMUSCULAR | Status: DC | PRN
  Administered 2021-10-04: 2 mg via INTRAVENOUS

## 2021-10-04 MED ORDER — GLUCAGON HCL RDNA (DIAGNOSTIC) 1 MG IJ SOLR
INTRAMUSCULAR | Status: AC
  Filled 2021-10-04: qty 1

## 2021-10-04 MED ORDER — PROPOFOL 10 MG/ML IV BOLUS
INTRAVENOUS | Status: AC
  Filled 2021-10-04: qty 20

## 2021-10-04 MED ORDER — CIPROFLOXACIN IN D5W 400 MG/200ML IV SOLN
INTRAVENOUS | Status: DC | PRN
  Administered 2021-10-04: 400 mg via INTRAVENOUS

## 2021-10-04 NOTE — Op Note (Signed)
[facility] Patient Name: Dean [name] Procedure Date: 10/04/2021 MRN: [id number] Attending MD: Clarene Essex , MD Date of Birth: 08-Aug-1966 CSN: [id number] Age: 55 Admit Type: Outpatient Procedure:                ERCP Indications:              Abnormal MRCP compatible with distal obstruction                            and concerns for Tumor of the head of pancreas Providers:                Clarene Essex, MD, Kary Kos RN, RN, Tyna Jaksch                            Technician Referring MD:              Medicines:                General Anesthesia Complications:            No immediate complications. Estimated Blood Loss:     Estimated blood loss: none. Procedure:                Pre-Anesthesia Assessment:                           - Prior to the procedure, a History and Physical                            was performed, and patient medications and                            allergies were reviewed. The patient's tolerance of                            previous anesthesia was also reviewed. The risks                            and benefits of the procedure and the sedation                            options and risks were discussed with the patient.                            All questions were answered, and informed consent                            was obtained. Prior Anticoagulants: The patient has                            taken no previous anticoagulant or antiplatelet                            agents. ASA Grade Assessment: II - A patient with  mild systemic disease. After reviewing the risks                            and benefits, the patient was deemed in                            satisfactory condition to undergo the procedure.                           After obtaining informed consent, the scope was                            passed under direct vision. Throughout the                            procedure, the patient's blood  pressure, pulse, and                            oxygen saturations were monitored continuously. The                            TJF-Q180V ([id number] Olympus duodenoscope was                            introduced through the mouth, and used to inject                            contrast into and used to locate the major papilla.                            The ERCP was technically difficult and complex due                            to challenging cannulation because of abnormal                            anatomy. Successful completion of the procedure was                            aided by performing the maneuvers documented                            (below) in this report. The patient tolerated the                            procedure well. Scope In: Scope Out: Findings:      The major papilla was normal. Initially the wire went towards the       pancreas a few times despite repositioning the sphincterotome so we       placed one 4 Fr by 3 cm pancreatic stent with a 3/4 external pigtail and       no internal flaps was placed 2 cm into the ventral pancreatic duct. The       stent was in good position. We were  unsuccessful trying to cannulate       next to the stent and we proceeded with A biliary pre-cut sphincterotomy       was made with a Hydratome sphincterotome using ERBE electrocautery.       There was no post-sphincterotomy bleeding. This was fairly short and in       continuing our efforts to cannulate next to the stent the stent fell out       of the pancreatic duct and one stent was removed from Duodenum using a       large-capacity forceps and sent for cytology. We then tried to cannulate       with the smaller sphincterotome and wire and again the wire would go       towards the pancreas so we placed one 4 Fr by 5 cm pancreatic stent with       a 3/4 external pigtail and no internal flaps was placed 4 cm into the       ventral pancreatic duct. The stent was in good position. This  time in       cannulating next to the stent deep selective cannulation of the CBD was       readily obtained in the dilated CBD and intrahepatic's was confirmed and       we proceeded with a medium size biliary sphincterotomy was made with a       Hydratome sphincterotome using ERBE electrocautery. There was no       post-sphincterotomy bleeding. We had adequate biliary drainage at this       point and we proceeded with brushing and cells for cytology were       obtained by brushing in the lower third of the main bile duct. One 10 Fr       by 5 cm plastic stent with a single external flap and a single internal       flap was placed 4.5 cm into the common bile duct. Bile flowed through       the stent. The stent was in good position. The scope was removed the       patient tolerated the procedure well Impression:               - The major papilla appeared normal.                           - One pancreatic stent was placed into the ventral                            pancreatic duct.                           - A short precut biliary sphincterotomy was                            performed.                           - One stent was removed from Duodenum.                           - One pancreatic stent was placed into the ventral  pancreatic duct.                           - A biliary sphincterotomy was performed.                           - Cells for cytology obtained in the lower third of                            the main duct.                           - One plastic stent was placed into the common bile                            duct. Moderate Sedation:      Not Applicable - Patient had care per Anesthesia. Recommendation:           - Clear liquid diet for 6 hours. If doing well this                            evening may have soft solid                           - Continue present medications.                           - Await cytology results and await path  results.                           - Return to GI clinic PRN.                           - Telephone GI clinic for pathology results in 4                            days.                           - Check liver enzymes (AST, ALT, alkaline                            phosphatase, bilirubin), hemogram with white blood                            cell count and platelets and CA 19-9 in 1 week.                           - Refer to a surgeon at appointment to be                            scheduled. And will check an x-ray in 1 to 2 weeks                            to confirm the PD  stent falling out Procedure Code(s):        --- Professional ---                           [phone], Esophagogastroduodenoscopy, flexible,                            transoral; with removal of foreign body(s) Diagnosis Code(s):        --- Professional ---                           Z46.59, Encounter for fitting and adjustment of                            other gastrointestinal appliance and device                           D49.0, Neoplasm of unspecified behavior of                            digestive system                           R93.2, Abnormal findings on diagnostic imaging of                            liver and biliary tract CPT copyright 2019 [employer]. All rights reserved. The codes documented in this report are preliminary and upon coder review may  be revised to meet current compliance requirements. Clarene Essex, MD 10/04/2021 1:55:40 PM This report has been signed electronically. Number of Addenda: 0

## 2021-10-04 NOTE — H&P (Signed)
GASTROENTEROLOGY PROCEDURE H&P NOTE   Primary Care Physician: Scifres, Earlie Server, PA-C  HPI: Joncarlos Atkison Atienza is a 55 y.o. male who presents for EUS/EGD in setting of dilated bile duct and abnormal MRI/MRCP concerning for biliary obstruction.  ERCP to be completed by Dr. Watt Climes.  Past Medical History:  Diagnosis Date   Bipolar 1 disorder ([place])    Thyroid disease    Past Surgical History:  Procedure Laterality Date   BACK SURGERY     KNEE SURGERY     NOSE SURGERY     ROTATOR CUFF REPAIR     No current facility-administered medications for this encounter.   Facility-Administered Medications Ordered in Other Encounters  Medication Dose Route Frequency Provider Last Rate Last Admin   lactated ringers infusion   Intravenous Continuous PRN Williford, Jinger Neighbors, CRNA   New Bag at 10/04/21 1034   No current facility-administered medications for this encounter.  Facility-Administered Medications Ordered in Other Encounters:    lactated ringers infusion, , Intravenous, Continuous PRN, Williford, Peggy D, CRNA, New Bag at 10/04/21 1034 Allergies  Allergen Reactions   Erythromycin Nausea Only   Ibuprofen Other (See Comments)    Peptic Ulcer nsaids   History reviewed. No pertinent family history. Social History   Socioeconomic History   Marital status: Married    Spouse name: Not on file   Number of children: Not on file   Years of education: Not on file   Highest education level: Not on file  Occupational History   Not on file  Tobacco Use   Smoking status: Former   Smokeless tobacco: Never  Substance and Sexual Activity   Alcohol use: Yes   Drug use: No   Sexual activity: Not on file  Other Topics Concern   Not on file  Social History Narrative   Not on file   Social Determinants of Health   Financial Resource Strain: Not on file  Food Insecurity: Not on file  Transportation Needs: Not on file  Physical Activity: Not on file  Stress: Not on file  Social  Connections: Not on file  Intimate Partner Violence: Not on file    Physical Exam: Today's Vitals   10/04/21 0958  BP: 136/79  Pulse: 74  Resp: (!) 24  Temp: 98.9 F (37.2 C)  SpO2: 99%  Weight: 80.3 kg  Height: 5\' 11"  (1.803 m)  PainSc: 0-No pain   Body mass index is 24.69 kg/m. GEN: NAD EYE: Sclerae anicteric ENT: MMM CV: Non-tachycardic GI: Soft, NT/ND NEURO:  Alert & Oriented x 3  Lab Results: No results for input(s): WBC, HGB, HCT, PLT in the last 72 hours. BMET No results for input(s): NA, K, CL, CO2, GLUCOSE, BUN, CREATININE, CALCIUM in the last 72 hours. LFT No results for input(s): PROT, ALBUMIN, AST, ALT, ALKPHOS, BILITOT, BILIDIR, IBILI in the last 72 hours. PT/INR No results for input(s): LABPROT, INR in the last 72 hours.   Impression / Plan: This is a 55 y.o.male who presents for EUS/EGD in setting of dilated bile duct and abnormal MRI/MRCP concerning for biliary obstruction.  ERCP to be completed by Dr. Watt Climes.  The risks of an EUS including intestinal perforation, bleeding, infection, aspiration, and medication effects were discussed as was the possibility it may not give a definitive diagnosis if a biopsy is performed.  When a biopsy of the pancreas is done as part of the EUS, there is an additional risk of pancreatitis at the rate of about 1-2%.  It  was explained that procedure related pancreatitis is typically mild, although it can be severe and even life threatening, which is why we do not perform random pancreatic biopsies and only biopsy a lesion/area we feel is concerning enough to warrant the risk.  The risks and benefits of endoscopic evaluation/treatment were discussed with the patient and/or family; these include but are not limited to the risk of perforation, infection, bleeding, missed lesions, lack of diagnosis, severe illness requiring hospitalization, as well as anesthesia and sedation related illnesses.  The patient's history has been reviewed,  patient examined, no change in status, and deemed stable for procedure.  The patient and/or family is agreeable to proceed.    Justice Britain, MD [facility] Office # [phone]

## 2021-10-04 NOTE — Discharge Instructions (Signed)
Call if question or problem otherwise clear liquid diet until 7 PM and if doing well this evening may have soft solids if not slowly advance diet tomorrow and will set up surgical appointment and plan to have repeat lab work next week and call me in 4 days if you have not heard from [place] regarding the pathology

## 2021-10-04 NOTE — Progress Notes (Signed)
Dean [name] 11:19 AM  Subjective: Patient seen and examined and case discussed with the patient and his wife as well and he did not have any problems this weekend no new complaints since I saw him last week in the office  Objective: Vital signs stable afebrile exam please see preassessment evaluation x-rays reviewed  Assessment: Obstructive jaundice  Plan: The procedure was rediscussed with the patient and his wife and Dr. Rush Landmark has been gracious enough to start with an EUS and I discussed that procedure with them as well and then I will proceed with the ERCP with anesthesia assistance with further work-up and plans pending those findings  [facility]  office [phone] After 5PM or if no answer call [phone]

## 2021-10-04 NOTE — ED Triage Notes (Signed)
Pt has ERCP procedure this morning around 11 am. Pt now c/o lower abdominal pain, sweats, and pts skin is grossly jaundice x 1 hour.

## 2021-10-04 NOTE — Anesthesia Procedure Notes (Signed)
Procedure Name: Intubation Date/Time: 10/04/2021 11:24 AM Performed by: Mckinley Adelstein D, CRNA Pre-anesthesia Checklist: Patient identified, Emergency Drugs available, Suction available and Patient being monitored Patient Re-evaluated:Patient Re-evaluated prior to induction Oxygen Delivery Method: Circle system utilized Preoxygenation: Pre-oxygenation with 100% oxygen Induction Type: IV induction Ventilation: Mask ventilation without difficulty Laryngoscope Size: Mac and 4 Tube type: Oral Number of attempts: 1 Airway Equipment and Method: Stylet Placement Confirmation: ETT inserted through vocal cords under direct vision, positive ETCO2 and breath sounds checked- equal and bilateral Secured at: 7.5 cm Tube secured with: Tape Dental Injury: Teeth and Oropharynx as per pre-operative assessment

## 2021-10-04 NOTE — Op Note (Signed)
[facility] Patient Name: Dean [name] Procedure Date: 10/04/2021 MRN: [id number] Attending MD: Justice Britain , MD Date of Birth: Apr 15, 1966 CSN: [id number] Age: 55 Admit Type: Outpatient Procedure:                Upper EUS Indications:              Common bile duct dilation (acquired) seen on MRCP,                            CBD stricture on MRCP, Suspected mass in pancreas                            on MRCP Providers:                Justice Britain, MD, Kary Kos RN, RN, Tyna Jaksch Technician Referring MD:             Clarene Essex, MD, PA [place] Medicines:                General Anesthesia Complications:            No immediate complications. Estimated Blood Loss:     Estimated blood loss was minimal. Procedure:                Pre-Anesthesia Assessment:                           - Prior to the procedure, a History and Physical                            was performed, and patient medications and                            allergies were reviewed. The patient's tolerance of                            previous anesthesia was also reviewed. The risks                            and benefits of the procedure and the sedation                            options and risks were discussed with the patient.                            All questions were answered, and informed consent                            was obtained. Prior Anticoagulants: The patient has                            taken no previous anticoagulant or antiplatelet  agents. ASA Grade Assessment: III - A patient with                            severe systemic disease. After reviewing the risks                            and benefits, the patient was deemed in                            satisfactory condition to undergo the procedure.                           After obtaining informed consent, the endoscope was                             passed under direct vision. Throughout the                            procedure, the patient's blood pressure, pulse, and                            oxygen saturations were monitored continuously. The                            [id number]-[id number] ([id number]) Olympus endoscope was introduced                            through the mouth, and advanced to the second part                            of duodenum. The [id number]-[id number] ([id number]) Olympus                            duodenoscope was introduced through the mouth, and                            advanced to the area of papilla. The [id number]                            ([id number]) Olympus linear ultrasound scope was                            introduced through the mouth, and advanced to the                            duodenum for ultrasound examination from the                            stomach and duodenum. The upper EUS was                            accomplished without difficulty. The patient  tolerated the procedure. Scope In: Scope Out: Findings:      ENDOSCOPIC FINDING: :      No gross lesions were noted in the entire esophagus.      The Z-line was irregular and was found 45 cm from the incisors.      A J-shaped deformity was found of the stomach.      No gross lesions were noted in the entire examined stomach.      No other gross lesions were noted in the duodenal bulb, in the first       portion of the duodenum and in the second portion of the duodenum.      ENDOSONOGRAPHIC FINDING: :      There was dilation in the common bile duct and in the common hepatic       duct which measured up to 18 mm.      There was a suggestion of a stricture in the lower third of the main       bile duct coursing through the head of the pancreas. Fine needle biopsy       was performed of the head of pancreas region. Color Doppler imaging was       utilized prior to needle puncture to confirm a lack of significant       vascular structures  within the needle path. Five passes were made with       the Acquire 22 gauge ultrasound core biopsy needle using a transduodenal       approach. Visible cores of tissue were obtained. Preliminary cytologic       examination and touch preps were performed. Final cytology results are       pending.      Moderate hyperechoic material consistent with sludge was visualized       endosonographically in the gallbladder.      Pancreatic parenchymal abnormalities were noted in the entire pancreas.       These consisted of lobularity without honeycombing and hyperechoic       strands. Clear mass however is not noted.      The pancreatic duct had a normal endosonographic appearance in the       pancreatic head (1.7 mm), genu of the pancreas (1.5 mm), body of the       pancreas (1.4 mm) and tail of the pancreas (1.1 mm).      No malignant-appearing lymph nodes were visualized in the celiac region       (level 20), peripancreatic region and porta hepatis region.      Endosonographic imaging in the visualized portion of the liver showed no       abnormal echogenicity, abnormal echotexture, cyst, intrahepatic ductal       dilation, lesion or mass.      The celiac region was visualized. Impression:               EGD Impression:                           - No gross lesions in esophagus. Z-line irregular,                            45 cm from the incisors.                           - J-shaped deformity of  the stomach. No other gross                            lesions in the stomach.                           - No gross lesions in the duodenal bulb, in the                            first portion of the duodenum and in the second                            portion of the duodenum.                           EUS Impression:                           - There was dilation in the common bile duct and in                            the common hepatic duct which measured up to 18 mm.                           -  There was a suggestion of a stricture in the                            lower third of the main bile duct coursing through                            the head of the pancreas. However, a clear mass of                            the HOP was not noted on today's examination. Fine                            needle biopsy performed of the strictured area in                            effort of trying to get a diagnosis.                           - Hyperechoic material consistent with sludge was                            visualized endosonographically in the gallbladder.                           - Pancreatic parenchymal abnormalities consisting                            of lobularity and hyperechoic strands were noted in  the entire pancreas. Does not meet criteria for                            Chronic pancreatitis but is suggestive.                           - The pancreatic duct had a normal endosonographic                            appearance in the pancreatic head, genu of the                            pancreas, body of the pancreas and tail of the                            pancreas.                           - No malignant-appearing lymph nodes were                            visualized in the celiac region (level 20),                            peripancreatic region and porta hepatis region. Moderate Sedation:      Not Applicable - Patient had care per Anesthesia. Recommendation:           - The patient will be observed post-procedure,                            until all discharge criteria are met.                           - Proceed to scheduled ERCP for attempt at biliary                            decompression with Dr. Watt Climes.                           - Await cytology results.                           - Consider Fecal elastase testing in future.                           - The findings and recommendations were discussed                             with the referring physician. Procedure Code(s):        --- Professional ---                           [phone], Esophagogastroduodenoscopy, flexible,  transoral; with transendoscopic ultrasound-guided                            intramural or transmural fine needle                            aspiration/biopsy(s), (includes endoscopic                            ultrasound examination limited to the esophagus,                            stomach or duodenum, and adjacent structures) Diagnosis Code(s):        --- Professional ---                           K22.8, Other specified diseases of esophagus                           K31.89, Other diseases of stomach and duodenum                           K83.8, Other specified diseases of biliary tract                           K86.9, Disease of pancreas, unspecified                           I89.9, Noninfective disorder of lymphatic vessels                            and lymph nodes, unspecified                           K83.1, Obstruction of bile duct                           R93.3, Abnormal findings on diagnostic imaging of                            other parts of digestive tract                           R93.2, Abnormal findings on diagnostic imaging of                            liver and biliary tract CPT copyright 2019 [employer]. All rights reserved. The codes documented in this report are preliminary and upon coder review may  be revised to meet current compliance requirements. Justice Britain, MD 10/04/2021 12:37:04 PM Number of Addenda: 0

## 2021-10-04 NOTE — ED Provider Notes (Signed)
[place] DEPT Provider Note   CSN: [id number] Arrival date & time: 10/04/21  2226     History Chief Complaint  Patient presents with   Abdominal Pain   Post-op Problem    Dean [name] is a 55 y.o. male.  Patient here with abdominal pain status post ERCP procedure around 11 AM.  States he is having much more severe pain in his lower abdomen for like this.  He is being worked up for a possible pancreatic obstruction obstructive jaundice.  He had an ERCP today there is some concern for post procedure pancreatitis or perforation.  Denies fevers.  Has had nausea but no vomiting. Abdominal pain is low in his abdomen and spreads diffusely. He has never had this pain in the past. He was told to come to the ED by GI. He is being worked up for dilated bile duct and abnormal MRI/MRCP concerning for biliary obstruction.  The history is provided by the patient and the spouse.  Abdominal Pain Associated symptoms: nausea   Associated symptoms: no chest pain, no dysuria, no fever, no hematuria and no vomiting       Past Medical History:  Diagnosis Date   Bipolar 1 disorder ([place])    Thyroid disease     Patient Active Problem List   Diagnosis Date Noted   Bipolar disorder ([place]) 12/25/2018    Past Surgical History:  Procedure Laterality Date   BACK SURGERY     KNEE SURGERY     NOSE SURGERY     ROTATOR CUFF REPAIR         No family history on file.  Social History   Tobacco Use   Smoking status: Former   Smokeless tobacco: Never  Substance Use Topics   Alcohol use: Yes   Drug use: No    Home Medications Prior to Admission medications   Medication Sig Start Date End Date Taking? Authorizing Provider  ALPRAZolam Duanne Moron) 0.5 MG tablet Take 1 tablet (0.5 mg total) by mouth at bedtime as needed for anxiety. 06/02/21   Cottle, Billey Co., MD  divalproex (DEPAKOTE ER) 500 MG 24 hr tablet TAKE 5 TABLETS (2,500 MG TOTAL) BY MOUTH  DAILY. Patient taking differently: Take 2,000 mg by mouth at bedtime. 03/16/21   Cottle, Billey Co., MD  levothyroxine (SYNTHROID) 137 MCG tablet Take 137 mcg by mouth daily before breakfast. 01/08/20   [provider]  Multiple Vitamins-Minerals (ONE DAILY MENS 50+ MULTIVIT) TABS Take 1 tablet by mouth daily.    [provider]  pantoprazole (PROTONIX) 40 MG tablet Take 40 mg by mouth daily.    [provider]    Allergies    Erythromycin and Ibuprofen  Review of Systems   Review of Systems  Constitutional:  Negative for activity change, appetite change and fever.  HENT:  Negative for congestion and rhinorrhea.   Respiratory:  Negative for chest tightness.   Cardiovascular:  Negative for chest pain.  Gastrointestinal:  Positive for abdominal pain and nausea. Negative for vomiting.  Genitourinary:  Negative for dysuria and hematuria.  Musculoskeletal:  Negative for arthralgias and myalgias.  Skin:  Negative for rash.  Neurological:  Negative for dizziness, weakness and headaches.   all other systems are negative except as noted in the HPI and PMH.   Physical Exam Updated Vital Signs BP 114/70 (BP Location: Left Arm)   Pulse (!) 47   Temp 98.7 F (37.1 C) (Oral)   Resp 12  Ht 5\' 11"  (1.803 m)   Wt 80.3 kg   SpO2 100%   BMI 24.69 kg/m   Physical Exam Vitals and nursing note reviewed.  Constitutional:      General: He is not in acute distress.    Appearance: He is well-developed. He is ill-appearing.     Comments: Jaundiced  HENT:     Head: Normocephalic and atraumatic.     Mouth/Throat:     Pharynx: No oropharyngeal exudate.  Eyes:     General: Scleral icterus present.     Conjunctiva/sclera: Conjunctivae normal.     Pupils: Pupils are equal, round, and reactive to light.  Neck:     Comments: No meningismus. Cardiovascular:     Rate and Rhythm: Normal rate and regular rhythm.     Heart sounds: Normal heart sounds. No murmur  heard. Pulmonary:     Effort: Pulmonary effort is normal. No respiratory distress.     Breath sounds: Normal breath sounds.  Abdominal:     Palpations: Abdomen is soft.     Tenderness: There is abdominal tenderness. There is guarding. There is no rebound.     Comments: Diffuse lower abdominal tenderness. Guarding LLQ. No rebound No upper abdominal pain  Musculoskeletal:        General: No tenderness. Normal range of motion.     Cervical back: Normal range of motion and neck supple.  Skin:    General: Skin is warm.  Neurological:     Mental Status: He is alert and oriented to person, place, and time.     Cranial Nerves: No cranial nerve deficit.     Motor: No abnormal muscle tone.     Coordination: Coordination normal.     Comments: No ataxia on finger to nose bilaterally. No pronator drift. 5/5 strength throughout. CN 2-12 intact.Equal grip strength. Sensation intact.   Psychiatric:        Behavior: Behavior normal.    ED Results / Procedures / Treatments   Labs (all labs ordered are listed, but only abnormal results are displayed) Labs Reviewed  CBC WITH DIFFERENTIAL/PLATELET - Abnormal; Notable for the following components:      Result Value   RDW 21.4 (*)    Abs Immature Granulocytes 0.25 (*)    All other components within normal limits  COMPREHENSIVE METABOLIC PANEL - Abnormal; Notable for the following components:   Sodium 130 (*)    Chloride 96 (*)    CO2 21 (*)    Glucose, Bld 159 (*)    Albumin 2.9 (*)    AST 339 (*)    ALT 320 (*)    Alkaline Phosphatase 592 (*)    Total Bilirubin 29.2 (*)    All other components within normal limits  LIPASE, BLOOD - Abnormal; Notable for the following components:   Lipase 4,625 (*)    All other components within normal limits  URINALYSIS, ROUTINE W REFLEX MICROSCOPIC - Abnormal; Notable for the following components:   Color, Urine AMBER (*)    Glucose, UA 50 (*)    Bilirubin Urine MODERATE (*)    Ketones, ur 20 (*)     Protein, ur 30 (*)    All other components within normal limits  COMPREHENSIVE METABOLIC PANEL - Abnormal; Notable for the following components:   Sodium 133 (*)    Glucose, Bld 128 (*)    Calcium 8.4 (*)    Total Protein 5.4 (*)    Albumin 2.3 (*)    AST 341 (*)  ALT 307 (*)    Alkaline Phosphatase 509 (*)    Total Bilirubin 23.4 (*)    All other components within normal limits  CBC - Abnormal; Notable for the following components:   RBC 3.51 (*)    Hemoglobin 12.1 (*)    HCT 33.1 (*)    MCH 34.5 (*)    MCHC 36.6 (*)    RDW 21.5 (*)    All other components within normal limits  TRIGLYCERIDES - Abnormal; Notable for the following components:   Triglycerides 255 (*)    All other components within normal limits  RESP PANEL BY RT-PCR (FLU A&B, COVID) ARPGX2  CULTURE, BLOOD (ROUTINE X 2)  CULTURE, BLOOD (ROUTINE X 2)  LACTIC ACID, PLASMA  LACTIC ACID, PLASMA  MAGNESIUM  PHOSPHORUS  HIV ANTIBODY (ROUTINE TESTING W REFLEX)  LIPASE, BLOOD  HEMOGLOBIN A1C    EKG None  Radiology CT ABDOMEN PELVIS W CONTRAST  Result Date: 10/05/2021 CLINICAL DATA:  Abdominal pain, acute, nonlocalized post ERCP with stent today. Jaundice EXAM: CT ABDOMEN AND PELVIS WITH CONTRAST TECHNIQUE: Multidetector CT imaging of the abdomen and pelvis was performed using the standard protocol following bolus administration of intravenous contrast. CONTRAST:  39mL OMNIPAQUE IOHEXOL 350 MG/ML SOLN COMPARISON:  None. FINDINGS: Lower chest: Visualized lung bases are clear. The visualized heart and pericardium are unremarkable. Hepatobiliary: Pneumobilia is present in keeping with reported history of sphincterotomy and internal biliary stenting. Silastic stent is seen within the distal common duct extending into the second portion of the duodenum. No intra or extrahepatic biliary ductal dilation. Mild pericholecystic inflammatory stranding is present which is nonspecific and may relate to the adjacent inflammatory  process involving the pancreas or may be postprocedural in nature. No enhancing intrahepatic mass identified. Pancreas: There is moderate peripancreatic acute inflammatory fluid in keeping with changes of acute interstitial/edematous pancreatitis. Normal enhancement of the pancreatic parenchyma. No pancreatic necrosis identified. Stent is seen within the central pancreatic duct extending into the second portion of the duodenum. Pancreatic duct is not dilated. No peripancreatic necrotic collections are identified. Spleen: Unremarkable Adrenals/Urinary Tract: Adrenal glands are unremarkable. Kidneys are normal, without renal calculi, focal lesion, or hydronephrosis. Bladder is unremarkable. Stomach/Bowel: Small free fluid within the pelvis. Stomach, small bowel, and large bowel are unremarkable. Appendix normal. No free intraperitoneal gas. Vascular/Lymphatic: Aortic atherosclerosis. No enlarged abdominal or pelvic lymph nodes. Reproductive: Prostate is unremarkable. Other: No abdominal wall hernia.  Rectum unremarkable. Musculoskeletal: No acute bone abnormality. No lytic or blastic bone lesion. Degenerative changes are seen within the lumbosacral junction. IMPRESSION: Status post internal biliary and pancreatic stenting. Pneumobilia in keeping with sphincterotomy and violation of the sphincter of Oddi. Moderate peripancreatic acute inflammatory fluid in keeping with acute interstitial/edematous pancreatitis. No pancreatic or peripancreatic necrosis identified. The pancreatic duct is not dilated. Mild pericholecystic inflammatory stranding, nonspecific and possibly related to the adjacent inflammatory process within the pancreas or recent biliary intervention. Mild ascites. No free intraperitoneal gas or retroperitoneal gas identified. Aortic Atherosclerosis (ICD10-I70.0). Electronically Signed   By: Fidela Salisbury M.D.   On: 10/05/2021 03:40   DG Chest Portable 1 View  Result Date: 10/04/2021 CLINICAL DATA:   Abdominal pain after ERCP this morning EXAM: PORTABLE CHEST 1 VIEW COMPARISON:  Chest radiograph 08/19/2015 FINDINGS: The cardiomediastinal contours are normal. The lungs are clear. Pulmonary vasculature is normal. No consolidation, pleural effusion, or pneumothorax. No acute osseous abnormalities are seen. No visible free air under the hemidiaphragm. IMPRESSION: Negative radiograph of the chest. Electronically Signed  By: Keith Rake M.D.   On: 10/04/2021 23:29   DG ERCP  Result Date: 10/04/2021 CLINICAL DATA:  55 year old male with suspected malignant obstructed jaundice. EXAM: ERCP TECHNIQUE: Multiple spot images obtained with the fluoroscopic device and submitted for interpretation post-procedure. FLUOROSCOPY TIME:  Fluoroscopy Time:  4 minutes 36 seconds Number of Acquired Spot Images: 0 COMPARISON:  MRCP 09/27/2021 FINDINGS: A total of 3 intraoperative spot images are submitted for review. The images demonstrate a flexible duodenal scope in the descending duodenum. A plastic stent is placed in the pancreatic duct followed by cannulation of the common bile duct. Cholangiogram demonstrates marked intra and extrahepatic biliary ductal dilatation. IMPRESSION: 1. Placement of plastic pancreatic stent. 2. Cholangiogram demonstrates marked intra and extrahepatic biliary ductal dilatation. These images were submitted for radiologic interpretation only. Please see the procedural report for the amount of contrast and the fluoroscopy time utilized. Electronically Signed   By: Jacqulynn Cadet M.D.   On: 10/04/2021 13:43   DG Abd Portable 2 Views  Result Date: 10/04/2021 CLINICAL DATA:  Abdominal pain following ERCP. EXAM: PORTABLE ABDOMEN - 2 VIEW COMPARISON:  None FINDINGS: There is mild gaseous distension of multiple loops of small bowel suggesting a mild postprocedural ileus. Varicoid lucencies within the right paraspinal region are in keeping with pneumobilia likely related to reported ERCP  procedure. Internal biliary and pancreatic stents are in expected position. There is lucency surrounding the right kidney as well as streaky lucencies inferior to the right kidney, likely representing retroperitoneal gas within the right pararenal space. No free intraperitoneal gas is clearly identified. IMPRESSION: Suspected right retroperitoneal gas. This could be confirmed with CT imaging. Pneumobilia in keeping with ERCP examination. Internal biliary and pancreatic stents overlie the expected position. Electronically Signed   By: Fidela Salisbury M.D.   On: 10/04/2021 23:35    Procedures Procedures   Medications Ordered in ED Medications  HYDROmorphone (DILAUDID) injection 1 mg (has no administration in time range)  ondansetron (ZOFRAN) injection 4 mg (has no administration in time range)  sodium chloride 0.9 % bolus 1,000 mL (1,000 mLs Intravenous New Bag/Given (Non-Interop) 10/04/21 2313)    ED Course  I have reviewed the triage vital signs and the nursing notes.  Pertinent labs & imaging results that were available during my care of the patient were reviewed by me and considered in my medical decision making (see chart for details).    MDM Rules/Calculators/A&P                          Post ERCP pain. Ill appearing but stable vitals. Abdomen without peritoneal signs.  Patient started on IV fluids, pain and nausea medications, labs and imaging will be obtained.  Lactate normal, no leukocytosis, questionable retroperitoneal gas seen on plain film without intraperitoneal free air.  Discussed with Dr. Kieth Brightly of general surgery who request to be called back after CT scan.  LFTs elevated with high bilirubin.  Lipase is pending.  CT scan negative for free air.  Shows severe pancreatitis  Patient pain is improved.  Continue IV fluids and symptom control.  Plan admission for pancreatitis post procedure.  No reason for acute surgical intervention.  [facility] aware of  patient.  Admission discussed with Dr. Nevada Crane Final Clinical Impression(s) / ED Diagnoses Final diagnoses:  Acute pancreatitis without infection or necrosis, unspecified pancreatitis type    Rx / DC Orders ED Discharge Orders     None  Ezequiel Essex, MD 10/05/21 [phone]

## 2021-10-04 NOTE — Anesthesia Postprocedure Evaluation (Signed)
Anesthesia Post Note  Patient: Dean [name]  Procedure(s) Performed: ENDOSCOPIC RETROGRADE CHOLANGIOPANCREATOGRAPHY (ERCP) (Bilateral) ESOPHAGOGASTRODUODENOSCOPY (EGD) WITH PROPOFOL UPPER ESOPHAGEAL ENDOSCOPIC ULTRASOUND (EUS) FOREIGN BODY REMOVAL FINE NEEDLE ASPIRATION (FNA) LINEAR SPHINCTEROTOMY BILIARY STENT PLACEMENT PANCREATIC STENT PLACEMENT STENT REMOVAL BILIARY BRUSHING     Patient location during evaluation: PACU Anesthesia Type: General Level of consciousness: sedated Pain management: pain level controlled Vital Signs Assessment: post-procedure vital signs reviewed and stable Respiratory status: spontaneous breathing and respiratory function stable Cardiovascular status: stable Postop Assessment: no apparent nausea or vomiting Anesthetic complications: no   No notable events documented.  Last Vitals:  Vitals:   10/04/21 1410 10/04/21 1420  BP: 119/81 130/78  Pulse: 70 66  Resp: 20 16  Temp:    SpO2: 97% 96%    Last Pain:  Vitals:   10/04/21 1420  TempSrc:   PainSc: 0-No pain                 Delyle Weider DANIEL

## 2021-10-04 NOTE — Transfer of Care (Signed)
Immediate Anesthesia Transfer of Care Note  Patient: Dean [name]  Procedure(s) Performed: ENDOSCOPIC RETROGRADE CHOLANGIOPANCREATOGRAPHY (ERCP) (Bilateral) ESOPHAGOGASTRODUODENOSCOPY (EGD) WITH PROPOFOL UPPER ESOPHAGEAL ENDOSCOPIC ULTRASOUND (EUS) FOREIGN BODY REMOVAL FINE NEEDLE ASPIRATION (FNA) LINEAR  Patient Location: PACU  Anesthesia Type:General  Level of Consciousness: awake, alert  and oriented  Airway & Oxygen Therapy: Patient Spontanous Breathing and Patient connected to face mask oxygen  Post-op Assessment: Report given to RN and Post -op Vital signs reviewed and stable  Post vital signs: Reviewed and stable  Last Vitals:  Vitals Value Taken Time  BP    Temp    Pulse    Resp 18 10/04/21 1335  SpO2    Vitals shown include unvalidated device data.  Last Pain:  Vitals:   10/04/21 0958  PainSc: 0-No pain         Complications: No notable events documented.

## 2021-10-05 ENCOUNTER — Emergency Department (HOSPITAL_COMMUNITY): Payer: BC Managed Care – PPO

## 2021-10-05 ENCOUNTER — Encounter (HOSPITAL_COMMUNITY): Payer: Self-pay

## 2021-10-05 DIAGNOSIS — K5900 Constipation, unspecified: Secondary | ICD-10-CM | POA: Diagnosis not present

## 2021-10-05 DIAGNOSIS — F319 Bipolar disorder, unspecified: Secondary | ICD-10-CM | POA: Diagnosis present

## 2021-10-05 DIAGNOSIS — Z881 Allergy status to other antibiotic agents status: Secondary | ICD-10-CM | POA: Diagnosis not present

## 2021-10-05 DIAGNOSIS — Z20822 Contact with and (suspected) exposure to covid-19: Secondary | ICD-10-CM | POA: Diagnosis present

## 2021-10-05 DIAGNOSIS — E039 Hypothyroidism, unspecified: Secondary | ICD-10-CM

## 2021-10-05 DIAGNOSIS — E878 Other disorders of electrolyte and fluid balance, not elsewhere classified: Secondary | ICD-10-CM | POA: Diagnosis present

## 2021-10-05 DIAGNOSIS — D638 Anemia in other chronic diseases classified elsewhere: Secondary | ICD-10-CM | POA: Diagnosis present

## 2021-10-05 DIAGNOSIS — E871 Hypo-osmolality and hyponatremia: Secondary | ICD-10-CM | POA: Diagnosis present

## 2021-10-05 DIAGNOSIS — R7401 Elevation of levels of liver transaminase levels: Secondary | ICD-10-CM | POA: Diagnosis present

## 2021-10-05 DIAGNOSIS — R17 Unspecified jaundice: Secondary | ICD-10-CM | POA: Diagnosis present

## 2021-10-05 DIAGNOSIS — R739 Hyperglycemia, unspecified: Secondary | ICD-10-CM | POA: Diagnosis present

## 2021-10-05 DIAGNOSIS — K859 Acute pancreatitis without necrosis or infection, unspecified: Secondary | ICD-10-CM | POA: Diagnosis present

## 2021-10-05 DIAGNOSIS — K831 Obstruction of bile duct: Secondary | ICD-10-CM | POA: Diagnosis present

## 2021-10-05 DIAGNOSIS — E872 Acidosis, unspecified: Secondary | ICD-10-CM | POA: Diagnosis present

## 2021-10-05 DIAGNOSIS — Z6824 Body mass index (BMI) 24.0-24.9, adult: Secondary | ICD-10-CM | POA: Diagnosis not present

## 2021-10-05 DIAGNOSIS — E441 Mild protein-calorie malnutrition: Secondary | ICD-10-CM | POA: Diagnosis present

## 2021-10-05 DIAGNOSIS — Z79899 Other long term (current) drug therapy: Secondary | ICD-10-CM | POA: Diagnosis not present

## 2021-10-05 DIAGNOSIS — Z886 Allergy status to analgesic agent status: Secondary | ICD-10-CM | POA: Diagnosis not present

## 2021-10-05 DIAGNOSIS — Z79890 Hormone replacement therapy (postmenopausal): Secondary | ICD-10-CM | POA: Diagnosis not present

## 2021-10-05 DIAGNOSIS — Z87891 Personal history of nicotine dependence: Secondary | ICD-10-CM | POA: Diagnosis not present

## 2021-10-05 DIAGNOSIS — K858 Other acute pancreatitis without necrosis or infection: Secondary | ICD-10-CM

## 2021-10-05 DIAGNOSIS — I700 Atherosclerosis of aorta: Secondary | ICD-10-CM | POA: Diagnosis present

## 2021-10-05 LAB — COMPREHENSIVE METABOLIC PANEL
ALT: 320 U/L — ABNORMAL HIGH (ref 0–44)
ALT: 307 U/L — ABNORMAL HIGH (ref 0–44)
AST: 339 U/L — ABNORMAL HIGH (ref 15–41)
AST: 341 U/L — ABNORMAL HIGH (ref 15–41)
Albumin: 2.9 g/dL — ABNORMAL LOW (ref 3.5–5.0)
Albumin: 2.3 g/dL — ABNORMAL LOW (ref 3.5–5.0)
Alkaline Phosphatase: 592 U/L — ABNORMAL HIGH (ref 38–126)
Alkaline Phosphatase: 509 U/L — ABNORMAL HIGH (ref 38–126)
Anion gap: 13 (ref 5–15)
Anion gap: 7 (ref 5–15)
BUN: 13 mg/dL (ref 6–20)
BUN: 13 mg/dL (ref 6–20)
CO2: 21 mmol/L — ABNORMAL LOW (ref 22–32)
CO2: 24 mmol/L (ref 22–32)
Calcium: 9.3 mg/dL (ref 8.9–10.3)
Calcium: 8.4 mg/dL — ABNORMAL LOW (ref 8.9–10.3)
Chloride: 96 mmol/L — ABNORMAL LOW (ref 98–111)
Chloride: 102 mmol/L (ref 98–111)
Creatinine, Ser: 0.66 mg/dL (ref 0.61–1.24)
Creatinine, Ser: 0.65 mg/dL (ref 0.61–1.24)
GFR, Estimated: >60 mL/min (ref >60)
GFR, Estimated: >60 mL/min (ref >60)
Glucose, Bld: 159 mg/dL — ABNORMAL HIGH (ref 70–99)
Glucose, Bld: 128 mg/dL — ABNORMAL HIGH (ref 70–99)
Potassium: 4.1 mmol/L (ref 3.5–5.1)
Potassium: 3.7 mmol/L (ref 3.5–5.1)
Sodium: 130 mmol/L — ABNORMAL LOW (ref 135–145)
Sodium: 133 mmol/L — ABNORMAL LOW (ref 135–145)
Total Bilirubin: 29.2 mg/dL (ref 0.3–1.2)
Total Bilirubin: 23.4 mg/dL (ref 0.3–1.2)
Total Protein: 6.6 g/dL (ref 6.5–8.1)
Total Protein: 5.4 g/dL — ABNORMAL LOW (ref 6.5–8.1)

## 2021-10-05 LAB — CBC WITH DIFFERENTIAL/PLATELET
Abs Immature Granulocytes: 0.25 10*3/uL — ABNORMAL HIGH (ref 0.00–0.07)
Basophils Absolute: 0.1 10*3/uL (ref 0.0–0.1)
Basophils Relative: 1 %
Eosinophils Absolute: 0 10*3/uL (ref 0.0–0.5)
Eosinophils Relative: 0 %
HCT: 39 % (ref 39.0–52.0)
Hemoglobin: 13.9 g/dL (ref 13.0–17.0)
Immature Granulocytes: 4 %
Lymphocytes Relative: 17 %
Lymphs Abs: 1.1 10*3/uL (ref 0.7–4.0)
MCH: 32.9 pg (ref 26.0–34.0)
MCHC: 35.6 g/dL (ref 30.0–36.0)
MCV: 92.2 fL (ref 80.0–100.0)
Monocytes Absolute: 0.7 10*3/uL (ref 0.1–1.0)
Monocytes Relative: 11 %
Neutro Abs: 4.4 10*3/uL (ref 1.7–7.7)
Neutrophils Relative %: 67 %
Platelets: 228 10*3/uL (ref 150–400)
RBC: 4.23 MIL/uL (ref 4.22–5.81)
RDW: 21.4 % — ABNORMAL HIGH (ref 11.5–15.5)
WBC: 6.5 10*3/uL (ref 4.0–10.5)
nRBC: 0 % (ref 0.0–0.2)

## 2021-10-05 LAB — URINALYSIS, ROUTINE W REFLEX MICROSCOPIC
Bacteria, UA: NONE SEEN
Glucose, UA: 50 mg/dL — AB
Hgb urine dipstick: NEGATIVE
Ketones, ur: 20 mg/dL — AB
Leukocytes,Ua: NEGATIVE
Nitrite: NEGATIVE
Protein, ur: 30 mg/dL — AB
Specific Gravity, Urine: 1.026 (ref 1.005–1.030)
pH: 5 (ref 5.0–8.0)

## 2021-10-05 LAB — CBC
HCT: 33.1 % — ABNORMAL LOW (ref 39.0–52.0)
Hemoglobin: 12.1 g/dL — ABNORMAL LOW (ref 13.0–17.0)
MCH: 34.5 pg — ABNORMAL HIGH (ref 26.0–34.0)
MCHC: 36.6 g/dL — ABNORMAL HIGH (ref 30.0–36.0)
MCV: 94.3 fL (ref 80.0–100.0)
Platelets: 195 10*3/uL (ref 150–400)
RBC: 3.51 MIL/uL — ABNORMAL LOW (ref 4.22–5.81)
RDW: 21.5 % — ABNORMAL HIGH (ref 11.5–15.5)
WBC: 7.7 10*3/uL (ref 4.0–10.5)
nRBC: 0 % (ref 0.0–0.2)

## 2021-10-05 LAB — HEMOGLOBIN A1C
Hgb A1c MFr Bld: 5.1 % (ref 4.8–5.6)
Mean Plasma Glucose: 99.67 mg/dL

## 2021-10-05 LAB — LIPASE, BLOOD
Lipase: 4625 U/L — ABNORMAL HIGH (ref 11–51)
Lipase: 1638 U/L — ABNORMAL HIGH (ref 11–51)

## 2021-10-05 LAB — MAGNESIUM: Magnesium: 2 mg/dL (ref 1.7–2.4)

## 2021-10-05 LAB — LACTIC ACID, PLASMA
Lactic Acid, Venous: 1.5 mmol/L (ref 0.5–1.9)
Lactic Acid, Venous: 1.1 mmol/L (ref 0.5–1.9)

## 2021-10-05 LAB — RESP PANEL BY RT-PCR (FLU A&B, COVID) ARPGX2
Influenza A by PCR: NEGATIVE
Influenza B by PCR: NEGATIVE
SARS Coronavirus 2 by RT PCR: NEGATIVE

## 2021-10-05 LAB — HIV ANTIBODY (ROUTINE TESTING W REFLEX): HIV Screen 4th Generation wRfx: NONREACTIVE

## 2021-10-05 LAB — TRIGLYCERIDES: Triglycerides: 255 mg/dL — ABNORMAL HIGH (ref <150)

## 2021-10-05 LAB — PHOSPHORUS: Phosphorus: 3.8 mg/dL (ref 2.5–4.6)

## 2021-10-05 MED ORDER — ALPRAZOLAM 0.5 MG PO TABS: 0.5000 mg | ORAL_TABLET | Freq: Every evening | ORAL | Status: DC | PRN

## 2021-10-05 MED ORDER — DIVALPROEX SODIUM ER 500 MG PO TB24
2000.0000 mg | ORAL_TABLET | Freq: Every day | ORAL | Status: DC
Start: 2021-10-05 — End: 2021-10-11
  Administered 2021-10-05 – 2021-10-10 (×5): 2000 mg via ORAL
  Filled 2021-10-05 (×6): qty 4

## 2021-10-05 MED ORDER — HYDROMORPHONE HCL 1 MG/ML IJ SOLN
1.0000 mg | Freq: Once | INTRAMUSCULAR | Status: AC
  Administered 2021-10-05: 1 mg via INTRAVENOUS
  Filled 2021-10-05: qty 1

## 2021-10-05 MED ORDER — HYDROMORPHONE HCL 1 MG/ML IJ SOLN
0.5000 mg | INTRAMUSCULAR | Status: DC | PRN
Start: 2021-10-05 — End: 2021-10-11
  Administered 2021-10-05: 0.5 mg via INTRAVENOUS
  Filled 2021-10-05: qty 1

## 2021-10-05 MED ORDER — ONDANSETRON HCL 4 MG/2ML IJ SOLN: 4.0000 mg | Freq: Four times a day (QID) | INTRAMUSCULAR | Status: DC | PRN

## 2021-10-05 MED ORDER — POLYETHYLENE GLYCOL 3350 17 G PO PACK: 17.0000 g | PACK | Freq: Every day | ORAL | Status: DC | PRN

## 2021-10-05 MED ORDER — PIPERACILLIN-TAZOBACTAM 3.375 G IVPB
3.3750 g | Freq: Three times a day (TID) | INTRAVENOUS | Status: DC
  Administered 2021-10-05 – 2021-10-06 (×3): 3.375 g via INTRAVENOUS
  Filled 2021-10-05 (×3): qty 50

## 2021-10-05 MED ORDER — SODIUM CHLORIDE 0.9 % IV SOLN: INTRAVENOUS | Status: DC

## 2021-10-05 MED ORDER — LEVOTHYROXINE SODIUM 25 MCG PO TABS
137.0000 ug | ORAL_TABLET | Freq: Every day | ORAL | Status: DC
  Administered 2021-10-05 – 2021-10-11 (×7): 137 ug via ORAL
  Filled 2021-10-05 (×7): qty 1

## 2021-10-05 MED ORDER — MELATONIN 3 MG PO TABS: 3.0000 mg | ORAL_TABLET | Freq: Every evening | ORAL | Status: DC | PRN

## 2021-10-05 MED ORDER — SODIUM CHLORIDE 0.9 % IV BOLUS
1000.0000 mL | Freq: Once | INTRAVENOUS | Status: AC
  Administered 2021-10-05: 1000 mL via INTRAVENOUS

## 2021-10-05 MED ORDER — PIPERACILLIN-TAZOBACTAM 3.375 G IVPB 30 MIN
3.3750 g | Freq: Once | INTRAVENOUS | Status: AC
  Administered 2021-10-05: 3.375 g via INTRAVENOUS
  Filled 2021-10-05: qty 50

## 2021-10-05 MED ORDER — IOHEXOL 350 MG/ML SOLN
80.0000 mL | Freq: Once | INTRAVENOUS | Status: AC | PRN
  Administered 2021-10-05: 80 mL via INTRAVENOUS

## 2021-10-05 MED ORDER — PANTOPRAZOLE SODIUM 40 MG PO TBEC
40.0000 mg | DELAYED_RELEASE_TABLET | Freq: Every day | ORAL | Status: DC
  Administered 2021-10-05 – 2021-10-11 (×7): 40 mg via ORAL
  Filled 2021-10-05 (×7): qty 1

## 2021-10-05 MED ORDER — OXYCODONE HCL 5 MG PO TABS
5.0000 mg | ORAL_TABLET | Freq: Four times a day (QID) | ORAL | Status: DC | PRN
  Administered 2021-10-05 – 2021-10-10 (×13): 5 mg via ORAL
  Filled 2021-10-05 (×13): qty 1

## 2021-10-05 MED ORDER — LEVOTHYROXINE SODIUM 137 MCG PO TABS: 137.0000 ug | ORAL_TABLET | Freq: Every day | ORAL | Status: DC

## 2021-10-05 MED ORDER — SODIUM CHLORIDE (PF) 0.9 % IJ SOLN
INTRAMUSCULAR | Status: AC
  Filled 2021-10-05: qty 50

## 2021-10-05 NOTE — ED Notes (Signed)
Pt care taken, iv started and normal saliene given.

## 2021-10-05 NOTE — Progress Notes (Signed)
Dean [name] Carmicheal 8:27 AM  Subjective: Patient seen and examined and discussed with his wife and the hospital team and he is doing better than he was last night we reviewed the procedure CT and labs answered all of their questions and he wants to try clear liquids and his pain is mostly left lower  Objective: Vital signs stable afebrile no acute distress abdomen is only mildly tender on the left side soft labs and CT reviewed  Assessment: Distal biliary stricture status post EUS and ERCP and stenting yesterday now with postprocedure pancreatitis hopefully mild  Plan: Agree with clear liquids and hospital observation my partner Dr. Alessandra Bevels will check on tomorrow and await cytology and pathology and follow labs and when bili less than 5 would draw a CA 19 and will need a follow-up x-ray regarding pancreatic stent in 1 or 2 weeks  [facility]  office [phone] After 5PM or if no answer call [phone]

## 2021-10-05 NOTE — H&P (Signed)
History and Physical  Dean [name] XBJ:[id number] DOB: 08/14/1966 DOA: 10/04/2021  Referring physician: Dr. Wyvonnia Dusky, [place]  PCP: Maude Leriche, PA-C  Outpatient Specialists: GI. Patient coming from: Home.  Chief Complaint: Abdominal pain.  HPI: [name] is a 55 y.o. male with medical history significant for bipolar disorder, hypothyroidism, abnormal MRI/MRCP concerning for biliary obstruction post ERCP on 10/04/2021 who presented to [facility] ED due to severe lower abdominal pain post ERCP.  After his procedure in the morning, he was sent home.  He was doing okay until suddenly he developed severe lower abdominal pain.  He presented to the ED for further evaluation and management.  Work-up in the ED revealed acute pancreatitis seen on CT scan, no necrosis.  Also revealed acute transaminitis, jaundice with T bilirubin of 29.  He received IV fluid hydration and IV narcotics in the ED.  [facility], hospitalist team, was asked to admit.  ED Course:  Temperature 98.7.  BP 124/79, pulse 56, respiration rate 13, O2 saturation 100% on room air.  Lab studies remarkable for serum sodium 130, serum bicarb 21, anion gap 13, glucose 159, alkaline phosphatase 192, AST 339, ALT 320, total bilirubin 29.  CBC is essentially unremarkable.  Review of Systems: Review of systems as noted in the HPI. All other systems reviewed and are negative.   Past Medical History:  Diagnosis Date   Bipolar 1 disorder ([place])    Thyroid disease    Past Surgical History:  Procedure Laterality Date   BACK SURGERY     KNEE SURGERY     NOSE SURGERY     ROTATOR CUFF REPAIR      Social History:  reports that he has quit smoking. He has never used smokeless tobacco. He reports current alcohol use. He reports that he does not use drugs.   Allergies  Allergen Reactions   Erythromycin Nausea Only   Ibuprofen Other (See Comments)    Peptic Ulcer nsaids    Family history: No pertinent family history.  Prior to  Admission medications   Medication Sig Start Date End Date Taking? Authorizing Provider  ALPRAZolam Duanne Moron) 0.5 MG tablet Take 1 tablet (0.5 mg total) by mouth at bedtime as needed for anxiety. 06/02/21   Cottle, Billey Co., MD  divalproex (DEPAKOTE ER) 500 MG 24 hr tablet TAKE 5 TABLETS (2,500 MG TOTAL) BY MOUTH DAILY. Patient taking differently: Take 2,000 mg by mouth at bedtime. 03/16/21   Cottle, Billey Co., MD  levothyroxine (SYNTHROID) 137 MCG tablet Take 137 mcg by mouth daily before breakfast. 01/08/20   [provider]  Multiple Vitamins-Minerals (ONE DAILY MENS 50+ MULTIVIT) TABS Take 1 tablet by mouth daily.    [provider]  pantoprazole (PROTONIX) 40 MG tablet Take 40 mg by mouth daily.    [provider]    Physical Exam: BP 124/79   Pulse (!) 56   Temp 98.7 F (37.1 C) (Oral)   Resp 13   Ht 5\' 11"  (1.803 m)   Wt 80.3 kg   SpO2 100%   BMI 24.69 kg/m   General: 55 y.o. year-old male well developed well nourished in no acute distress.  Alert and oriented x3.  Anicteric sclera. Cardiovascular: Regular rate and rhythm with no rubs or gallops.  No thyromegaly or JVD noted.  No lower extremity edema. 2/4 pulses in all 4 extremities. Respiratory: Clear to auscultation with no wheezes or rales. Good inspiratory effort. Abdomen: Soft lower abdominal tenderness nondistended with normal bowel  sounds x4 quadrants. Muskuloskeletal: No cyanosis, clubbing or edema noted bilaterally Neuro: CN II-XII intact, strength, sensation, reflexes Skin: No ulcerative lesions noted or rashes.  Jaundice. Psychiatry: Judgement and insight appear normal. Mood is appropriate for condition and setting          Labs on Admission:  Basic Metabolic Panel: Recent Labs  Lab 10/04/21 2229  NA 130*  K 4.1  CL 96*  CO2 21*  GLUCOSE 159*  BUN 13  CREATININE 0.66  CALCIUM 9.3   Liver Function Tests: Recent Labs  Lab 10/04/21 2229  AST 339*  ALT 320*  ALKPHOS 592*   BILITOT 29.2*  PROT 6.6  ALBUMIN 2.9*   Recent Labs  Lab 10/04/21 2229  LIPASE 4,625*   No results for input(s): AMMONIA in the last 168 hours. CBC: Recent Labs  Lab 10/04/21 2229  WBC 6.5  NEUTROABS 4.4  HGB 13.9  HCT 39.0  MCV 92.2  PLT 228   Cardiac Enzymes: No results for input(s): CKTOTAL, CKMB, CKMBINDEX, TROPONINI in the last 168 hours.  BNP (last 3 results) No results for input(s): BNP in the last 8760 hours.  ProBNP (last 3 results) No results for input(s): PROBNP in the last 8760 hours.  CBG: No results for input(s): GLUCAP in the last 168 hours.  Radiological Exams on Admission: CT ABDOMEN PELVIS W CONTRAST  Result Date: 10/05/2021 CLINICAL DATA:  Abdominal pain, acute, nonlocalized post ERCP with stent today. Jaundice EXAM: CT ABDOMEN AND PELVIS WITH CONTRAST TECHNIQUE: Multidetector CT imaging of the abdomen and pelvis was performed using the standard protocol following bolus administration of intravenous contrast. CONTRAST:  14mL OMNIPAQUE IOHEXOL 350 MG/ML SOLN COMPARISON:  None. FINDINGS: Lower chest: Visualized lung bases are clear. The visualized heart and pericardium are unremarkable. Hepatobiliary: Pneumobilia is present in keeping with reported history of sphincterotomy and internal biliary stenting. Silastic stent is seen within the distal common duct extending into the second portion of the duodenum. No intra or extrahepatic biliary ductal dilation. Mild pericholecystic inflammatory stranding is present which is nonspecific and may relate to the adjacent inflammatory process involving the pancreas or may be postprocedural in nature. No enhancing intrahepatic mass identified. Pancreas: There is moderate peripancreatic acute inflammatory fluid in keeping with changes of acute interstitial/edematous pancreatitis. Normal enhancement of the pancreatic parenchyma. No pancreatic necrosis identified. Stent is seen within the central pancreatic duct extending into  the second portion of the duodenum. Pancreatic duct is not dilated. No peripancreatic necrotic collections are identified. Spleen: Unremarkable Adrenals/Urinary Tract: Adrenal glands are unremarkable. Kidneys are normal, without renal calculi, focal lesion, or hydronephrosis. Bladder is unremarkable. Stomach/Bowel: Small free fluid within the pelvis. Stomach, small bowel, and large bowel are unremarkable. Appendix normal. No free intraperitoneal gas. Vascular/Lymphatic: Aortic atherosclerosis. No enlarged abdominal or pelvic lymph nodes. Reproductive: Prostate is unremarkable. Other: No abdominal wall hernia.  Rectum unremarkable. Musculoskeletal: No acute bone abnormality. No lytic or blastic bone lesion. Degenerative changes are seen within the lumbosacral junction. IMPRESSION: Status post internal biliary and pancreatic stenting. Pneumobilia in keeping with sphincterotomy and violation of the sphincter of Oddi. Moderate peripancreatic acute inflammatory fluid in keeping with acute interstitial/edematous pancreatitis. No pancreatic or peripancreatic necrosis identified. The pancreatic duct is not dilated. Mild pericholecystic inflammatory stranding, nonspecific and possibly related to the adjacent inflammatory process within the pancreas or recent biliary intervention. Mild ascites. No free intraperitoneal gas or retroperitoneal gas identified. Aortic Atherosclerosis (ICD10-I70.0). Electronically Signed   By: Fidela Salisbury M.D.   On: 10/05/2021  03:40   DG Chest Portable 1 View  Result Date: 10/04/2021 CLINICAL DATA:  Abdominal pain after ERCP this morning EXAM: PORTABLE CHEST 1 VIEW COMPARISON:  Chest radiograph 08/19/2015 FINDINGS: The cardiomediastinal contours are normal. The lungs are clear. Pulmonary vasculature is normal. No consolidation, pleural effusion, or pneumothorax. No acute osseous abnormalities are seen. No visible free air under the hemidiaphragm. IMPRESSION: Negative radiograph of the  chest. Electronically Signed   By: Keith Rake M.D.   On: 10/04/2021 23:29   DG ERCP  Result Date: 10/04/2021 CLINICAL DATA:  55 year old male with suspected malignant obstructed jaundice. EXAM: ERCP TECHNIQUE: Multiple spot images obtained with the fluoroscopic device and submitted for interpretation post-procedure. FLUOROSCOPY TIME:  Fluoroscopy Time:  4 minutes 36 seconds Number of Acquired Spot Images: 0 COMPARISON:  MRCP 09/27/2021 FINDINGS: A total of 3 intraoperative spot images are submitted for review. The images demonstrate a flexible duodenal scope in the descending duodenum. A plastic stent is placed in the pancreatic duct followed by cannulation of the common bile duct. Cholangiogram demonstrates marked intra and extrahepatic biliary ductal dilatation. IMPRESSION: 1. Placement of plastic pancreatic stent. 2. Cholangiogram demonstrates marked intra and extrahepatic biliary ductal dilatation. These images were submitted for radiologic interpretation only. Please see the procedural report for the amount of contrast and the fluoroscopy time utilized. Electronically Signed   By: Jacqulynn Cadet M.D.   On: 10/04/2021 13:43   DG Abd Portable 2 Views  Result Date: 10/04/2021 CLINICAL DATA:  Abdominal pain following ERCP. EXAM: PORTABLE ABDOMEN - 2 VIEW COMPARISON:  None FINDINGS: There is mild gaseous distension of multiple loops of small bowel suggesting a mild postprocedural ileus. Varicoid lucencies within the right paraspinal region are in keeping with pneumobilia likely related to reported ERCP procedure. Internal biliary and pancreatic stents are in expected position. There is lucency surrounding the right kidney as well as streaky lucencies inferior to the right kidney, likely representing retroperitoneal gas within the right pararenal space. No free intraperitoneal gas is clearly identified. IMPRESSION: Suspected right retroperitoneal gas. This could be confirmed with CT imaging.  Pneumobilia in keeping with ERCP examination. Internal biliary and pancreatic stents overlie the expected position. Electronically Signed   By: Fidela Salisbury M.D.   On: 10/04/2021 23:35    EKG: I independently viewed the EKG done and my findings are as followed: None available at the time of this dictation.  Ordered and pending.  Assessment/Plan Present on Admission:  Acute pancreatitis  Active Problems:   Acute pancreatitis  Acute pancreatitis post ERCP Developed severe abdominal pain post ERCP on 10/04/2021 Acute pancreatitis without necrosis seen on CT scan Presented with lipase level 4625, trend Obtain triglyceride level Continue IV fluid normal saline at 125 cc/h Monitor volume status, and reduce rate of IV fluid when indicated. Continue bowel rest Continue pain control, IV Dilaudid as needed for severe pain Continue p.o. oxycodone as needed for moderate pain  Acute transaminitis with jaundice Trend LFTs Avoid hepatotoxic agents  Dilated common bile duct and abnormal MRI/MRCP concerning for biliary obstruction post ERCP on 10/04/2021 with stents placement Per ERCP report:  - The major papilla appeared normal. - One pancreatic stent was placed into the ventral pancreatic duct. - A short precut biliary sphincterotomy was performed. - One stent was removed from Duodenum. - One pancreatic stent was placed into the ventral pancreatic duct. - A biliary sphincterotomy was performed. - Cells for cytology obtained in the lower third of the main duct. - One plastic stent  was placed into the common bile duct. Presenting total bilirubin 29 Continue IV fluid Repeat levels in the AM  Hyperglycemia Serum glucose 159 Obtain hemoglobin A1c Insulin sliding scale For hyperglycemia  Mild anion gap metabolic acidosis Presented with serum bicarb 21 and anion gap of 13 Continue IV fluid normal saline  Bipolar disorder Resume home Depakote  Hypothyroidism Resume home  levothyroxine   DVT prophylaxis: SCDs  Code Status: Full code  Family Communication: Wife at bedside  Disposition Plan: Admitted to telemetry unit  Consults called: [facility] [place], Dr. Lindi Adie, consulted via secure chat.  Admission status: Inpatient status.  Patient will require at least 2 midnights for further evaluation and treatment of present condition.   Status is: Inpatient       Kayleen Memos MD [facility] Pager [phone]  If 7PM-7AM, please contact night-coverage [email or web address] Password [facility]  10/05/2021, 4:42 AM

## 2021-10-05 NOTE — Progress Notes (Signed)
[facility] Short Progress notes  Patient admitted overnight by my colleague. I have evaluated the patient and reviewed the chart. I have also spoken with Dr Watt Climes and discussed the plan.   Principal Problem:   Acute pancreatitis - Post ERCP pancreatitis is improving- Lipase has improved from 4,625 to 1638 -  start clear liquids today and follow  Active Problems: Elevated LFTs/ CBD obstruction with severe jaundice - s/p ERCP and stenting of CBD and Pancreatic ducts by Dr Watt Climes yesterday - LFTs/T bili improving - biopsies are pending  Hyponatremia/ hypochloremia  - can continue IVF- sodium improving    Bipolar disorder ([place])   Hypothyroidism - home meds resumed  Normocytic anemia - follow intermittently  Debbe Odea, MD Pager: [email or web address]

## 2021-10-05 NOTE — Progress Notes (Signed)
Pharmacy Antibiotic Note  Dean [name] is a 55 y.o. male admitted on 10/04/2021 with acute pancreatitis, s/p ERCP on 10/31.  Pharmacy has been consulted for Zosyn dosing.  Plan: Zosyn 3.375g IV Q8H infused over 4hrs.   Height: 5\' 11"  (180.3 cm) Weight: 80.3 kg (177 lb) IBW/kg (Calculated) : 75.3  Temp (24hrs), Avg:99.8 F (37.7 C), Min:98.2 F (36.8 C), Max:101.8 F (38.8 C)  Recent Labs  Lab 10/04/21 2229 10/04/21 2301 10/05/21 0205 10/05/21 0525  WBC 6.5  --   --  7.7  CREATININE 0.66  --   --  0.65  LATICACIDVEN  --  1.5 1.1  --     Estimated Creatinine Clearance: 111.1 mL/min (by C-G formula based on SCr of 0.65 mg/dL).    Allergies  Allergen Reactions   Erythromycin Nausea Only   Ibuprofen Other (See Comments)    Peptic Ulcer nsaids    Antimicrobials this admission: 11/1 Zosyn >>   Dose adjustments this admission:   Microbiology results: 10/31 BCx:   Thank you for allowing pharmacy to be a part of this patient's care.  Gretta Arab PharmD, BCPS Clinical Pharmacist [facility] main pharmacy [phone] 10/05/2021 9:40 PM

## 2021-10-05 NOTE — Progress Notes (Signed)
A consult was received from an ED physician for Zosyn per pharmacy dosing.  The patient's profile has been reviewed for ht/wt/allergies/indication/available labs.   A one time order has been placed for Zosyn 3.375gm IV.  Further antibiotics/pharmacy consults should be ordered by admitting physician if indicated.                       Thank you, Netta Cedars PharmD 10/05/2021  12:18 AM

## 2021-10-06 ENCOUNTER — Encounter: Payer: Self-pay | Admitting: Gastroenterology

## 2021-10-06 DIAGNOSIS — K859 Acute pancreatitis without necrosis or infection, unspecified: Secondary | ICD-10-CM | POA: Diagnosis not present

## 2021-10-06 LAB — COMPREHENSIVE METABOLIC PANEL
ALT: 642 U/L — ABNORMAL HIGH (ref 0–44)
AST: 727 U/L — ABNORMAL HIGH (ref 15–41)
Albumin: 2.5 g/dL — ABNORMAL LOW (ref 3.5–5.0)
Alkaline Phosphatase: 790 U/L — ABNORMAL HIGH (ref 38–126)
Anion gap: 6 (ref 5–15)
BUN: 8 mg/dL (ref 6–20)
CO2: 26 mmol/L (ref 22–32)
Calcium: 9 mg/dL (ref 8.9–10.3)
Chloride: 99 mmol/L (ref 98–111)
Creatinine, Ser: 0.62 mg/dL (ref 0.61–1.24)
GFR, Estimated: >60 mL/min (ref >60)
Glucose, Bld: 95 mg/dL (ref 70–99)
Potassium: 3.9 mmol/L (ref 3.5–5.1)
Sodium: 131 mmol/L — ABNORMAL LOW (ref 135–145)
Total Bilirubin: 23.3 mg/dL (ref 0.3–1.2)
Total Protein: 5.7 g/dL — ABNORMAL LOW (ref 6.5–8.1)

## 2021-10-06 LAB — CYTOLOGY - NON PAP

## 2021-10-06 LAB — LIPASE, BLOOD: Lipase: 689 U/L — ABNORMAL HIGH (ref 11–51)

## 2021-10-06 MED ORDER — POLYETHYLENE GLYCOL 3350 17 G PO PACK
17.0000 g | PACK | Freq: Two times a day (BID) | ORAL | Status: DC
  Administered 2021-10-06 – 2021-10-10 (×8): 17 g via ORAL
  Filled 2021-10-06 (×10): qty 1

## 2021-10-06 MED ORDER — SODIUM CHLORIDE 0.9 % IV SOLN: INTRAVENOUS | Status: DC

## 2021-10-06 MED ORDER — SODIUM CHLORIDE 0.9 % IV SOLN
1.0000 g | Freq: Three times a day (TID) | INTRAVENOUS | Status: DC
  Administered 2021-10-06 – 2021-10-11 (×15): 1 g via INTRAVENOUS
  Filled 2021-10-06 (×17): qty 1

## 2021-10-06 NOTE — Progress Notes (Signed)
11/1  1938- MD paged for temperature of 100.9 F that patient had when he arrived to the unit. Patient has had no changes to his neurological status and is still alert and oriented x 4, his baseline. MD stated patient was unable to have tylenol at this point and with his ibuprofen allergy to use ice packs, covers off, and the room being room temperature to cool the patient off.  2123- MD paged for temperature of 101.8 F. Patient has had ice packs, covers off, and a room temperature room since first notifying MD of patients temperature. Patient has had no changes to his neurological status from his baseline, he is still alert and oriented x 4. MD asked if the patient would be willing to take one dose of ibuprofen. When asked the patient stated he was told not to take ibuprofen due to gastric ulcers, but if he had to he would be willing to take it as long as he did not bleed. MD stated he put in an order for Zosyn per pharmacy consult due to the patients liver/gastric history.  2337- MD paged for temperature of 101.1 F. MD was ok with the result since the patient was still actively receiving his first Zosyn dose and remained with the ice packs, covers off, and room temperature room. Patient has not experienced any neurological changes.  11/2  0121- MD notified the patients temperature had now become 99.2 F, which the MD was ok with.  Roseann.Parish - MD paged for temperature of 100.6 F. Patient has not experienced any neurological changes. MD stated to remain with his Zosyn administration and the ice packs to cool the patient off.   Will continue to monitor patient and patient's temperature closely.

## 2021-10-06 NOTE — Progress Notes (Signed)
[facility] Progress Note  Larone Kliethermes Afshar 55 y.o. [birth year]  CC: Pancreatitis   Subjective: Patient seen and examined at bedside.  Continues to have generalized abdominal pain.  And some nausea.  Denies any vomiting.  No bowel movement in last few days.  ROS : Positive for fever, negative for chest pain   Objective: Vital signs in last 24 hours: Vitals:   10/06/21 0540 10/06/21 0904  BP: 136/77 129/86  Pulse: 91 79  Resp: 18 20  Temp: (!) 100.6 F (38.1 C) 99.4 F (37.4 C)  SpO2: 100% 99%    Physical Exam:  General:  Alert, cooperative, no distress, appears stated age  Head:  Normocephalic, without obvious abnormality, atraumatic  Eyes:  , EOM's intact,   Lungs:   No Visible respiratory distress, decreased breath sounds bilaterally  Heart:  Regular rate and rhythm, S1, S2 normal  Abdomen:   Soft, central/periumbilical tenderness to palpation, minimally distended, bowel sounds present, no peritoneal signs  Extremities: Extremities normal, atraumatic, no  edema  Pulses: 2+ and symmetric    Lab Results: Recent Labs    10/05/21 0525 10/06/21 0436  NA 133* 131*  K 3.7 3.9  CL 102 99  CO2 24 26  GLUCOSE 128* 95  BUN 13 8  CREATININE 0.65 0.62  CALCIUM 8.4* 9.0  MG 2.0  --   PHOS 3.8  --    Recent Labs    10/05/21 0525 10/06/21 0436  AST 341* 727*  ALT 307* 642*  ALKPHOS 509* 790*  BILITOT 23.4* 23.3*  PROT 5.4* 5.7*  ALBUMIN 2.3* 2.5*   Recent Labs    10/04/21 2229 10/05/21 0525  WBC 6.5 7.7  NEUTROABS 4.4  --   HGB 13.9 12.1*  HCT 39.0 33.1*  MCV 92.2 94.3  PLT 228 195   No results for input(s): LABPROT, INR in the last 72 hours.    Assessment/Plan: -Obstructive jaundice with concerning for pancreatic head cancer.  S/p EUS and ERCP on October 04, 2021.  Presented with worsening abdominal pain and was diagnosed with post ERCP pancreatitis.  Now having fever.  Recommendations ------------------------- -Recommend to  increase IV hydration to 250 cc/h  -If ongoing fever, consider changing antibiotics to meropenem -MiraLAX for constipation -Continue clear liquids diet for now -Repeat labs in the morning.  Patient's hematocrit and BUN is trending down -GI will follow   Otis Brace MD, FACP 10/06/2021, 12:04 PM  Contact #  [phone]

## 2021-10-06 NOTE — Plan of Care (Signed)
  Problem: Education: Goal: Knowledge of General Education information will improve Description: Including pain rating scale, medication(s)/side effects and non-pharmacologic comfort measures Outcome: Progressing   Problem: Health Behavior/Discharge Planning: Goal: Ability to manage health-related needs will improve Outcome: Progressing   Problem: Clinical Measurements: Goal: Ability to maintain clinical measurements within normal limits will improve Outcome: Progressing Goal: Will remain free from infection Outcome: Progressing Goal: Diagnostic test results will improve Outcome: Progressing   Problem: Pain Managment: Goal: General experience of comfort will improve Outcome: Progressing

## 2021-10-06 NOTE — Progress Notes (Signed)
PROGRESS NOTE    Dean [name]  PNT:[id number] DOB: 08/02/1966 DOA: 10/04/2021 PCP: Maude Leriche, PA-C    Brief Narrative:  55 y.o. male with medical history significant for bipolar disorder, hypothyroidism, abnormal MRI/MRCP concerning for biliary obstruction post ERCP on 10/04/2021 who presented to [facility] ED due to severe lower abdominal pain post ERCP, found to have acute pancreatitis  Assessment & Plan:   Principal Problem:   Acute pancreatitis Active Problems:   Bipolar disorder ([name])   Hypothyroidism  Acute pancreatitis post ERCP Developed severe abdominal pain post ERCP on 10/04/2021 Acute pancreatitis without necrosis seen on CT scan Presented with lipase level 4625, trend, trending down to 689 Continue IV fluid, will increase rate to 250cc/hr as pt remains symptomatic Monitor volume status, and reduce rate of IV fluid when indicated. Continue with analgesia as needed   Acute transaminitis with jaundice LFT's trending up Avoid hepatotoxic agents Repeat LFT's in AM   Dilated common bile duct and abnormal MRI/MRCP concerning for biliary obstruction post ERCP on 10/04/2021 with stents placement Per ERCP report:  - The major papilla appeared normal. - One pancreatic stent was placed into the ventral pancreatic duct. - A short precut biliary sphincterotomy was performed. - One stent was removed from Duodenum. - One pancreatic stent was placed into the ventral pancreatic duct. - A biliary sphincterotomy was performed. - Cells for cytology obtained in the lower third of the main duct. - One plastic stent was placed into the common bile duct. Bili trending down to 23 today, from 29 Continue IV fluid as tolerated Repeat levels in the AM   Hyperglycemia Random glucose stable this AM A1c of 5.1   Mild anion gap metabolic acidosis Presented with serum bicarb 21 and anion gap of 13 Continue IV fluid normal saline as tolerated   Bipolar disorder Resume home  Depakote   Hypothyroidism Continued home levothyroxine    DVT prophylaxis: SCD's Code Status: Full Family Communication: Pt in room, family at bedside  Status is: Inpatient  Remains inpatient appropriate because: Acuity of illness     Consultants:  GI  Procedures:    Antimicrobials: Anti-infectives (From admission, onward)    Start     Dose/Rate Route Frequency Ordered Stop   10/05/21 2230  piperacillin-tazobactam (ZOSYN) IVPB 3.375 g  Status:  Discontinued        3.375 g 12.5 mL/hr over 240 Minutes Intravenous Every 8 hours 10/05/21 2143 10/06/21 1621   10/05/21 0030  piperacillin-tazobactam (ZOSYN) IVPB 3.375 g        3.375 g 100 mL/hr over 30 Minutes Intravenous  Once 10/05/21 0017 10/05/21 0129       Subjective: Still complaining of abd pain. Chills  Objective: Vitals:   10/06/21 0540 10/06/21 0904 10/06/21 1221 10/06/21 1600  BP: 136/77 129/86 129/83   Pulse: 91 79 88   Resp: 18 20 16    Temp: (!) 100.6 F (38.1 C) 99.4 F (37.4 C) 98.7 F (37.1 C) 100.2 F (37.9 C)  TempSrc: Oral Oral Oral Oral  SpO2: 100% 99% 100%   Weight:      Height:        Intake/Output Summary (Last 24 hours) at 10/06/2021 1621 Last data filed at 10/06/2021 1606 Gross per 24 hour  Intake 2222.7 ml  Output 2 ml  Net 2220.7 ml   Filed Weights   10/04/21 2303  Weight: 80.3 kg    Examination: General exam: Awake, laying in bed, in nad Respiratory system: Normal respiratory effort, no  wheezing Cardiovascular system: regular rate, s1, s2 Gastrointestinal system: Soft, nondistended, tender Central nervous system: CN2-12 grossly intact, strength intact Extremities: Perfused, no clubbing Skin: Normal skin turgor, no notable skin lesions seen, jaundice Psychiatry: Mood normal // no visual hallucinations   Data Reviewed: I have personally reviewed following labs and imaging studies  CBC: Recent Labs  Lab 10/04/21 2229 10/05/21 0525  WBC 6.5 7.7  NEUTROABS 4.4  --    HGB 13.9 12.1*  HCT 39.0 33.1*  MCV 92.2 94.3  PLT 228 408   Basic Metabolic Panel: Recent Labs  Lab 10/04/21 2229 10/05/21 0525 10/06/21 0436  NA 130* 133* 131*  K 4.1 3.7 3.9  CL 96* 102 99  CO2 21* 24 26  GLUCOSE 159* 128* 95  BUN 13 13 8   CREATININE 0.66 0.65 0.62  CALCIUM 9.3 8.4* 9.0  MG  --  2.0  --   PHOS  --  3.8  --    GFR: Estimated Creatinine Clearance: 111.1 mL/min (by C-G formula based on SCr of 0.62 mg/dL). Liver Function Tests: Recent Labs  Lab 10/04/21 2229 10/05/21 0525 10/06/21 0436  AST 339* 341* 727*  ALT 320* 307* 642*  ALKPHOS 592* 509* 790*  BILITOT 29.2* 23.4* 23.3*  PROT 6.6 5.4* 5.7*  ALBUMIN 2.9* 2.3* 2.5*   Recent Labs  Lab 10/04/21 2229 10/05/21 0525 10/06/21 0436  LIPASE 4,625* 1,638* 689*   No results for input(s): AMMONIA in the last 168 hours. Coagulation Profile: No results for input(s): INR, PROTIME in the last 168 hours. Cardiac Enzymes: No results for input(s): CKTOTAL, CKMB, CKMBINDEX, TROPONINI in the last 168 hours. BNP (last 3 results) No results for input(s): PROBNP in the last 8760 hours. HbA1C: Recent Labs    10/05/21 0525  HGBA1C 5.1   CBG: No results for input(s): GLUCAP in the last 168 hours. Lipid Profile: Recent Labs    10/05/21 0525  TRIG 255*   Thyroid Function Tests: No results for input(s): TSH, T4TOTAL, FREET4, T3FREE, THYROIDAB in the last 72 hours. Anemia Panel: No results for input(s): VITAMINB12, FOLATE, FERRITIN, TIBC, IRON, RETICCTPCT in the last 72 hours. Sepsis Labs: Recent Labs  Lab 10/04/21 2301 10/05/21 0205  LATICACIDVEN 1.5 1.1    Recent Results (from the past 240 hour(s))  Blood culture (routine x 2)     Status: None (Preliminary result)   Collection Time: 10/04/21 11:01 PM   Specimen: BLOOD  Result Value Ref Range Status   Specimen Description   Final    BLOOD LEFT ANTECUBITAL Performed at [facility], [place] [address], [place], [place]  [address]    Special Requests   Final    BOTTLES DRAWN AEROBIC AND ANAEROBIC Blood Culture adequate volume Performed at [place] [facility] [address] [place], [place] [address]    Culture   Final    NO GROWTH 1 DAY Performed at [facility], [place] [address], [place], [place] [address]    Report Status PENDING  Incomplete  Blood culture (routine x 2)     Status: None (Preliminary result)   Collection Time: 10/04/21 11:06 PM   Specimen: BLOOD  Result Value Ref Range Status   Specimen Description   Final    BLOOD RIGHT ANTECUBITAL Performed at [place] [address] [place], [place] [address]    Special Requests   Final    BOTTLES DRAWN AEROBIC AND ANAEROBIC Blood Culture adequate volume Performed at [facility],  [place] [address], [place], [place] [address]    Culture   Final    NO GROWTH 1 DAY Performed at [facility], [place] [address], [place], [place] [address]    Report Status PENDING  Incomplete  Resp Panel by RT-PCR (Flu A&B, Covid) Nasopharyngeal Swab     Status: None   Collection Time: 10/05/21  3:56 AM   Specimen: Nasopharyngeal Swab; Nasopharyngeal(NP) swabs in vial transport medium  Result Value Ref Range Status   SARS Coronavirus 2 by RT PCR NEGATIVE NEGATIVE Final    Comment: (NOTE) SARS-CoV-2 target nucleic acids are NOT DETECTED.  The SARS-CoV-2 RNA is generally detectable in upper respiratory specimens during the acute phase of infection. The lowest concentration of SARS-CoV-2 viral copies this assay can detect is 138 copies/mL. A negative result does not preclude SARS-Cov-2 infection and should not be used as the sole basis for treatment or other patient management decisions. A negative result may occur with  improper specimen collection/handling, submission of specimen other than nasopharyngeal swab, presence of viral mutation(s) within the areas targeted by this assay, and inadequate  number of viral copies(<138 copies/mL). A negative result must be combined with clinical observations, patient history, and epidemiological information. The expected result is Negative.  Fact Sheet for Patients:  EntrepreneurPulse.com.au  Fact Sheet for Healthcare Providers:  IncredibleEmployment.be  This test is no t yet approved or cleared by the [place] FDA and  has been authorized for detection and/or diagnosis of SARS-CoV-2 by FDA under an Emergency Use Authorization (EUA). This EUA will remain  in effect (meaning this test can be used) for the duration of the COVID-19 declaration under Section 564(b)(1) of the Act, 21 U.S.C.section 360bbb-3(b)(1), unless the authorization is terminated  or revoked sooner.       Influenza A by PCR NEGATIVE NEGATIVE Final   Influenza B by PCR NEGATIVE NEGATIVE Final    Comment: (NOTE) The Xpert Xpress SARS-CoV-2/FLU/RSV plus assay is intended as an aid in the diagnosis of influenza from Nasopharyngeal swab specimens and should not be used as a sole basis for treatment. Nasal washings and aspirates are unacceptable for Xpert Xpress SARS-CoV-2/FLU/RSV testing.  Fact Sheet for Patients: EntrepreneurPulse.com.au  Fact Sheet for Healthcare Providers: IncredibleEmployment.be  This test is not yet approved or cleared by the [place] FDA and has been authorized for detection and/or diagnosis of SARS-CoV-2 by FDA under an Emergency Use Authorization (EUA). This EUA will remain in effect (meaning this test can be used) for the duration of the COVID-19 declaration under Section 564(b)(1) of the Act, 21 U.S.C. section 360bbb-3(b)(1), unless the authorization is terminated or revoked.  Performed at [facility], [place] [address], [place], [place] [address]      Radiology Studies: CT ABDOMEN PELVIS W CONTRAST  Result Date: 10/05/2021 CLINICAL  DATA:  Abdominal pain, acute, nonlocalized post ERCP with stent today. Jaundice EXAM: CT ABDOMEN AND PELVIS WITH CONTRAST TECHNIQUE: Multidetector CT imaging of the abdomen and pelvis was performed using the standard protocol following bolus administration of intravenous contrast. CONTRAST:  59mL OMNIPAQUE IOHEXOL 350 MG/ML SOLN COMPARISON:  None. FINDINGS: Lower chest: Visualized lung bases are clear. The visualized heart and pericardium are unremarkable. Hepatobiliary: Pneumobilia is present in keeping with reported history of sphincterotomy and internal biliary stenting. Silastic stent is seen within the distal common duct extending into the second portion of the duodenum. No intra or extrahepatic biliary ductal dilation. Mild pericholecystic inflammatory stranding is present which is nonspecific and  may relate to the adjacent inflammatory process involving the pancreas or may be postprocedural in nature. No enhancing intrahepatic mass identified. Pancreas: There is moderate peripancreatic acute inflammatory fluid in keeping with changes of acute interstitial/edematous pancreatitis. Normal enhancement of the pancreatic parenchyma. No pancreatic necrosis identified. Stent is seen within the central pancreatic duct extending into the second portion of the duodenum. Pancreatic duct is not dilated. No peripancreatic necrotic collections are identified. Spleen: Unremarkable Adrenals/Urinary Tract: Adrenal glands are unremarkable. Kidneys are normal, without renal calculi, focal lesion, or hydronephrosis. Bladder is unremarkable. Stomach/Bowel: Small free fluid within the pelvis. Stomach, small bowel, and large bowel are unremarkable. Appendix normal. No free intraperitoneal gas. Vascular/Lymphatic: Aortic atherosclerosis. No enlarged abdominal or pelvic lymph nodes. Reproductive: Prostate is unremarkable. Other: No abdominal wall hernia.  Rectum unremarkable. Musculoskeletal: No acute bone abnormality. No lytic or  blastic bone lesion. Degenerative changes are seen within the lumbosacral junction. IMPRESSION: Status post internal biliary and pancreatic stenting. Pneumobilia in keeping with sphincterotomy and violation of the sphincter of Oddi. Moderate peripancreatic acute inflammatory fluid in keeping with acute interstitial/edematous pancreatitis. No pancreatic or peripancreatic necrosis identified. The pancreatic duct is not dilated. Mild pericholecystic inflammatory stranding, nonspecific and possibly related to the adjacent inflammatory process within the pancreas or recent biliary intervention. Mild ascites. No free intraperitoneal gas or retroperitoneal gas identified. Aortic Atherosclerosis (ICD10-I70.0). Electronically Signed   By: Fidela Salisbury M.D.   On: 10/05/2021 03:40   DG Chest Portable 1 View  Result Date: 10/04/2021 CLINICAL DATA:  Abdominal pain after ERCP this morning EXAM: PORTABLE CHEST 1 VIEW COMPARISON:  Chest radiograph 08/19/2015 FINDINGS: The cardiomediastinal contours are normal. The lungs are clear. Pulmonary vasculature is normal. No consolidation, pleural effusion, or pneumothorax. No acute osseous abnormalities are seen. No visible free air under the hemidiaphragm. IMPRESSION: Negative radiograph of the chest. Electronically Signed   By: Keith Rake M.D.   On: 10/04/2021 23:29   DG Abd Portable 2 Views  Result Date: 10/04/2021 CLINICAL DATA:  Abdominal pain following ERCP. EXAM: PORTABLE ABDOMEN - 2 VIEW COMPARISON:  None FINDINGS: There is mild gaseous distension of multiple loops of small bowel suggesting a mild postprocedural ileus. Varicoid lucencies within the right paraspinal region are in keeping with pneumobilia likely related to reported ERCP procedure. Internal biliary and pancreatic stents are in expected position. There is lucency surrounding the right kidney as well as streaky lucencies inferior to the right kidney, likely representing retroperitoneal gas within the  right pararenal space. No free intraperitoneal gas is clearly identified. IMPRESSION: Suspected right retroperitoneal gas. This could be confirmed with CT imaging. Pneumobilia in keeping with ERCP examination. Internal biliary and pancreatic stents overlie the expected position. Electronically Signed   By: Fidela Salisbury M.D.   On: 10/04/2021 23:35    Scheduled Meds:  divalproex  2,000 mg Oral QHS   levothyroxine  137 mcg Oral QAC breakfast   pantoprazole  40 mg Oral Daily   polyethylene glycol  17 g Oral BID   Continuous Infusions:  sodium chloride 75 mL/hr at 10/06/21 1335     LOS: 1 day   Marylu Lund, MD [facility] Pager On Amion  If 7PM-7AM, please contact night-coverage 10/06/2021, 4:21 PM

## 2021-10-06 NOTE — Progress Notes (Signed)
Pharmacy Antibiotic Note  Dean [name] is a 55 y.o. male admitted on 10/04/2021 with  intra-abdominal infection .  Pharmacy has been consulted for meropenem dosing.  Pt is s/p ERCP with stent placement on 10/31, currently on piperacillin/tazobactam for intra-abdominal infection. Antibiotics being broadened to meropenem - pharmacy to dose.   Today, 10/06/21 WBC WNL SCr WNL, stable Tmax 101.8 F  Plan: Meropenem 1 g IV q8h Significant drug interaction noted between meropenem and divalproex, resulting in decreased concentration of depakote. MD made aware of drug interaction. Pt prescribed depakote for bipolar disorder; no seizure history noted.  Follow renal function for necessary dose adjustments  Height: 5\' 11"  (180.3 cm) Weight: 80.3 kg (177 lb) IBW/kg (Calculated) : 75.3  Temp (24hrs), Avg:100 F (37.8 C), Min:98.2 F (36.8 C), Max:101.8 F (38.8 C)  Recent Labs  Lab 10/04/21 2229 10/04/21 2301 10/05/21 0205 10/05/21 0525 10/06/21 0436  WBC 6.5  --   --  7.7  --   CREATININE 0.66  --   --  0.65 0.62  LATICACIDVEN  --  1.5 1.1  --   --     Estimated Creatinine Clearance: 111.1 mL/min (by C-G formula based on SCr of 0.62 mg/dL).    Allergies  Allergen Reactions   Erythromycin Nausea Only   Ibuprofen Other (See Comments)    Peptic Ulcer nsaids    Antimicrobials this admission: Pip/tazo 11/1 >> 11/2 meropenem 11/2 >>   Dose adjustments this admission:  Microbiology results: 10/31 BCx: ngtd  Thank you for allowing pharmacy to be a part of this patient's care.  Lenis Noon, PharmD 10/06/2021 5:34 PM

## 2021-10-06 NOTE — Progress Notes (Signed)
MD paged for critical lab value of total bilirubin of 23.3.

## 2021-10-07 DIAGNOSIS — K859 Acute pancreatitis without necrosis or infection, unspecified: Secondary | ICD-10-CM | POA: Diagnosis not present

## 2021-10-07 LAB — CBC
HCT: 33 % — ABNORMAL LOW (ref 39.0–52.0)
Hemoglobin: 11.8 g/dL — ABNORMAL LOW (ref 13.0–17.0)
MCH: 33.7 pg (ref 26.0–34.0)
MCHC: 35.8 g/dL (ref 30.0–36.0)
MCV: 94.3 fL (ref 80.0–100.0)
Platelets: 173 10*3/uL (ref 150–400)
RBC: 3.5 MIL/uL — ABNORMAL LOW (ref 4.22–5.81)
RDW: 21.5 % — ABNORMAL HIGH (ref 11.5–15.5)
WBC: 15.1 10*3/uL — ABNORMAL HIGH (ref 4.0–10.5)
nRBC: 0 % (ref 0.0–0.2)

## 2021-10-07 LAB — COMPREHENSIVE METABOLIC PANEL
ALT: 542 U/L — ABNORMAL HIGH (ref 0–44)
AST: 484 U/L — ABNORMAL HIGH (ref 15–41)
Albumin: 2.1 g/dL — ABNORMAL LOW (ref 3.5–5.0)
Alkaline Phosphatase: 645 U/L — ABNORMAL HIGH (ref 38–126)
Anion gap: 7 (ref 5–15)
BUN: 7 mg/dL (ref 6–20)
CO2: 24 mmol/L (ref 22–32)
Calcium: 8.8 mg/dL — ABNORMAL LOW (ref 8.9–10.3)
Chloride: 101 mmol/L (ref 98–111)
Creatinine, Ser: 0.5 mg/dL — ABNORMAL LOW (ref 0.61–1.24)
GFR, Estimated: >60 mL/min (ref >60)
Glucose, Bld: 101 mg/dL — ABNORMAL HIGH (ref 70–99)
Potassium: 4.4 mmol/L (ref 3.5–5.1)
Sodium: 132 mmol/L — ABNORMAL LOW (ref 135–145)
Total Bilirubin: 21.9 mg/dL (ref 0.3–1.2)
Total Protein: 5.4 g/dL — ABNORMAL LOW (ref 6.5–8.1)

## 2021-10-07 LAB — LIPASE, BLOOD: Lipase: 79 U/L — ABNORMAL HIGH (ref 11–51)

## 2021-10-07 NOTE — Progress Notes (Signed)
PROGRESS NOTE    Dean [name]  WGN:[id number] DOB: 11-Nov-1966 DOA: 10/04/2021 PCP: Maude Leriche, PA-C    Brief Narrative:  55 y.o. male with medical history significant for bipolar disorder, hypothyroidism, abnormal MRI/MRCP concerning for biliary obstruction post ERCP on 10/04/2021 who presented to [facility] ED due to severe lower abdominal pain post ERCP, found to have acute pancreatitis  Assessment & Plan:   Principal Problem:   Acute pancreatitis Active Problems:   Bipolar disorder ([place])   Hypothyroidism  Acute pancreatitis post ERCP Developed severe abdominal pain post ERCP on 10/04/2021 Acute pancreatitis without necrosis seen on CT scan Presented with lipase level 4625, trend, continuing to trend down Continue IV fluid, will increase rate to 250cc/hr as pt continuing IV fluid hydration as tolerated remains symptomatic Monitor volume status, and reduce rate of IV fluid when indicated. Continue with analgesia as needed  Advance diet per gastroenterology  Acute transaminitis with jaundice LFT's trending up Avoid hepatotoxic agents Repeat LFT's in AM   Dilated common bile duct and abnormal MRI/MRCP concerning for biliary obstruction post ERCP on 10/04/2021 with stents placement Per ERCP report:  - The major papilla appeared normal. - One pancreatic stent was placed into the ventral pancreatic duct. - A short precut biliary sphincterotomy was performed. - One stent was removed from Duodenum. - One pancreatic stent was placed into the ventral pancreatic duct. - A biliary sphincterotomy was performed. - Cells for cytology obtained in the lower third of the main duct. - One plastic stent was placed into the common bile duct. Bilirubin down to 21.9 from peak of 29 random glucose remained stable and controlled this morning Continue IV fluid as tolerated Repeat levels in the AM   Hyperglycemia Random glucose stable this AM A1c of 5.1   Mild anion gap metabolic  acidosis Presented with serum bicarb 21 and anion gap of 13 Continue IV fluid normal saline as tolerated   Bipolar disorder Resume home Depakote   Hypothyroidism Continued home levothyroxine    DVT prophylaxis: SCD's Code Status: Full Family Communication: Pt in room, family currently not at bedside  Status is: Inpatient  Remains inpatient appropriate because: Acuity of illness     Consultants:  GI  Procedures:    Antimicrobials: Anti-infectives (From admission, onward)    Start     Dose/Rate Route Frequency Ordered Stop   10/06/21 2000  meropenem (MERREM) 1 g in sodium chloride 0.9 % 100 mL IVPB        1 g 200 mL/hr over 30 Minutes Intravenous Every 8 hours 10/06/21 1742     10/05/21 2230  piperacillin-tazobactam (ZOSYN) IVPB 3.375 g  Status:  Discontinued        3.375 g 12.5 mL/hr over 240 Minutes Intravenous Every 8 hours 10/05/21 2143 10/06/21 1621   10/05/21 0030  piperacillin-tazobactam (ZOSYN) IVPB 3.375 g        3.375 g 100 mL/hr over 30 Minutes Intravenous  Once 10/05/21 0017 10/05/21 0129       Subjective: Reports feeling better today.  Objective: Vitals:   10/06/21 2030 10/07/21 0256 10/07/21 0539 10/07/21 1242  BP: (!) 143/80 128/78 136/81 132/74  Pulse: 92 85 87 83  Resp: 16 18 16 20   Temp: 99.9 F (37.7 C) 99.9 F (37.7 C) 98.4 F (36.9 C) 99.1 F (37.3 C)  TempSrc: Oral Oral Oral Oral  SpO2: 98% 99% 96% 96%  Weight:      Height:        Intake/Output Summary (Last  24 hours) at 10/07/2021 1734 Last data filed at 10/07/2021 1705 Gross per 24 hour  Intake 3190.32 ml  Output --  Net 3190.32 ml    Filed Weights   10/04/21 2303  Weight: 80.3 kg    Examination: General exam: Conversant, in no acute distress Respiratory system: normal chest rise, clear, no audible wheezing Cardiovascular system: regular rhythm, s1-s2 Gastrointestinal system: Nondistended, nontender, pos BS Central nervous system: No seizures, no  tremors Extremities: No cyanosis, no joint deformities Skin: No rashes, no pallor Psychiatry: Affect normal // no auditory hallucinations   Data Reviewed: I have personally reviewed following labs and imaging studies  CBC: Recent Labs  Lab 10/04/21 2229 10/05/21 0525 10/07/21 0424  WBC 6.5 7.7 15.1*  NEUTROABS 4.4  --   --   HGB 13.9 12.1* 11.8*  HCT 39.0 33.1* 33.0*  MCV 92.2 94.3 94.3  PLT 228 195 161    Basic Metabolic Panel: Recent Labs  Lab 10/04/21 2229 10/05/21 0525 10/06/21 0436 10/07/21 0424  NA 130* 133* 131* 132*  K 4.1 3.7 3.9 4.4  CL 96* 102 99 101  CO2 21* 24 26 24   GLUCOSE 159* 128* 95 101*  BUN 13 13 8 7   CREATININE 0.66 0.65 0.62 0.50*  CALCIUM 9.3 8.4* 9.0 8.8*  MG  --  2.0  --   --   PHOS  --  3.8  --   --     GFR: Estimated Creatinine Clearance: 111.1 mL/min (A) (by C-G formula based on SCr of 0.5 mg/dL (L)). Liver Function Tests: Recent Labs  Lab 10/04/21 2229 10/05/21 0525 10/06/21 0436 10/07/21 0424  AST 339* 341* 727* 484*  ALT 320* 307* 642* 542*  ALKPHOS 592* 509* 790* 645*  BILITOT 29.2* 23.4* 23.3* 21.9*  PROT 6.6 5.4* 5.7* 5.4*  ALBUMIN 2.9* 2.3* 2.5* 2.1*    Recent Labs  Lab 10/04/21 2229 10/05/21 0525 10/06/21 0436 10/07/21 0424  LIPASE 4,625* 1,638* 689* 79*    No results for input(s): AMMONIA in the last 168 hours. Coagulation Profile: No results for input(s): INR, PROTIME in the last 168 hours. Cardiac Enzymes: No results for input(s): CKTOTAL, CKMB, CKMBINDEX, TROPONINI in the last 168 hours. BNP (last 3 results) No results for input(s): PROBNP in the last 8760 hours. HbA1C: Recent Labs    10/05/21 0525  HGBA1C 5.1    CBG: No results for input(s): GLUCAP in the last 168 hours. Lipid Profile: Recent Labs    10/05/21 0525  TRIG 255*    Thyroid Function Tests: No results for input(s): TSH, T4TOTAL, FREET4, T3FREE, THYROIDAB in the last 72 hours. Anemia Panel: No results for input(s):  VITAMINB12, FOLATE, FERRITIN, TIBC, IRON, RETICCTPCT in the last 72 hours. Sepsis Labs: Recent Labs  Lab 10/04/21 2301 10/05/21 0205  LATICACIDVEN 1.5 1.1     Recent Results (from the past 240 hour(s))  Blood culture (routine x 2)     Status: None (Preliminary result)   Collection Time: 10/04/21 11:01 PM   Specimen: BLOOD  Result Value Ref Range Status   Specimen Description   Final    BLOOD LEFT ANTECUBITAL Performed at [facility], [place] [address] [place], [place] [address]    Special Requests   Final    BOTTLES DRAWN AEROBIC AND ANAEROBIC Blood Culture adequate volume Performed at [facility] [address], [place], [place] [address]    Culture   Final    NO GROWTH 2 DAYS Performed at [facility], [place] [address] [place], [place] [address]    Report Status PENDING  Incomplete  Blood culture (routine x 2)     Status: None (Preliminary result)   Collection Time: 10/04/21 11:06 PM   Specimen: BLOOD  Result Value Ref Range Status   Specimen Description   Final    BLOOD RIGHT ANTECUBITAL Performed at [facility] [address] [place], [place] [address]    Special Requests   Final    BOTTLES DRAWN AEROBIC AND ANAEROBIC Blood Culture adequate volume Performed at [facility] [address] [place], [place] [address]    Culture   Final    NO GROWTH 2 DAYS Performed at [facility] [address] [place], [place] [address]    Report Status PENDING  Incomplete  Resp Panel by RT-PCR (Flu A&B, Covid) Nasopharyngeal Swab     Status: None   Collection Time: 10/05/21  3:56 AM   Specimen: Nasopharyngeal Swab; Nasopharyngeal(NP) swabs in vial transport medium  Result Value Ref Range Status   SARS Coronavirus 2 by RT PCR NEGATIVE NEGATIVE Final    Comment: (NOTE) SARS-CoV-2 target nucleic acids are NOT DETECTED.  The SARS-CoV-2 RNA is generally detectable in upper  respiratory specimens during the acute phase of infection. The lowest concentration of SARS-CoV-2 viral copies this assay can detect is 138 copies/mL. A negative result does not preclude SARS-Cov-2 infection and should not be used as the sole basis for treatment or other patient management decisions. A negative result may occur with  improper specimen collection/handling, submission of specimen other than nasopharyngeal swab, presence of viral mutation(s) within the areas targeted by this assay, and inadequate number of viral copies(<138 copies/mL). A negative result must be combined with clinical observations, patient history, and epidemiological information. The expected result is Negative.  Fact Sheet for Patients:  EntrepreneurPulse.com.au  Fact Sheet for Healthcare Providers:  IncredibleEmployment.be  This test is no t yet approved or cleared by the [place] FDA and  has been authorized for detection and/or diagnosis of SARS-CoV-2 by FDA under an Emergency Use Authorization (EUA). This EUA will remain  in effect (meaning this test can be used) for the duration of the COVID-19 declaration under Section 564(b)(1) of the Act, 21 U.S.C.section 360bbb-3(b)(1), unless the authorization is terminated  or revoked sooner.       Influenza A by PCR NEGATIVE NEGATIVE Final   Influenza B by PCR NEGATIVE NEGATIVE Final    Comment: (NOTE) The Xpert Xpress SARS-CoV-2/FLU/RSV plus assay is intended as an aid in the diagnosis of influenza from Nasopharyngeal swab specimens and should not be used as a sole basis for treatment. Nasal washings and aspirates are unacceptable for Xpert Xpress SARS-CoV-2/FLU/RSV testing.  Fact Sheet for Patients: EntrepreneurPulse.com.au  Fact Sheet for Healthcare Providers: IncredibleEmployment.be  This test is not yet approved or cleared by the [place] FDA and has been  authorized for detection and/or diagnosis of SARS-CoV-2 by FDA under an Emergency Use Authorization (EUA). This EUA will remain in effect (meaning this test can be used) for the duration of the COVID-19 declaration under Section 564(b)(1) of the Act, 21 U.S.C. section 360bbb-3(b)(1), unless the authorization is terminated or revoked.  Performed at [facility], [place] [address] [place], [place] [address]       Radiology Studies: No results found.  Scheduled Meds:  divalproex  2,000 mg Oral QHS   levothyroxine  137 mcg Oral QAC breakfast  pantoprazole  40 mg Oral Daily   polyethylene glycol  17 g Oral BID   Continuous Infusions:  sodium chloride 125 mL/hr at 10/07/21 1426   meropenem (MERREM) IV 1 g (10/07/21 1427)     LOS: 2 days   Marylu Lund, MD [facility] Pager On Amion  If 7PM-7AM, please contact night-coverage 10/07/2021, 5:34 PM

## 2021-10-07 NOTE — Progress Notes (Signed)
[facility] Progress Note  Dean [name] 55 y.o. 30-Oct-1966  CC: Pancreatitis   Subjective: Patient seen and examined at bedside.   Patient has not had any more fevers today, states broke last night. Patient continues to have generalized abdominal pain, some epigastric discomfort with deep breath.  But denies shortness of breath. Denies nausea vomiting, still on full liquid diet has some increased appetite but states not interested in trying food at this time. Patient had small formed bowel movement yesterday morning, denies blood. Patient is getting up to go to the restroom, has not started walking the halls.  Review of Systems  Constitutional:  Negative for chills and fever.  Respiratory:  Negative for shortness of breath.   Cardiovascular:  Negative for chest pain and leg swelling.  Gastrointestinal:  Positive for abdominal pain and constipation. Negative for blood in stool, diarrhea, heartburn, melena, nausea and vomiting.   Objective: Vital signs in last 24 hours: Vitals:   10/07/21 0256 10/07/21 0539  BP: 128/78 136/81  Pulse: 85 87  Resp: 18 16  Temp: 99.9 F (37.7 C) 98.4 F (36.9 C)  SpO2: 99% 96%    Physical Exam: General:   Alert, with jaundice, in NAD Heart:  Regular rate and rhythm; no murmurs Pulm: CTAB; no rales Abdomen:  Distended, AB, Sluggish bowel sounds. mild tenderness in the epigastrium and in the lower abdomen. With guarding and Without rebound,  Extremities:  Without edema, has SCDs Neurologic:  Alert and  oriented x4;  grossly normal neurologically. Psych:  Alert and cooperative. Normal mood and affect.   Lab Results: Recent Labs    10/05/21 0525 10/06/21 0436 10/07/21 0424  NA 133* 131* 132*  K 3.7 3.9 4.4  CL 102 99 101  CO2 24 26 24   GLUCOSE 128* 95 101*  BUN 13 8 7   CREATININE 0.65 0.62 0.50*  CALCIUM 8.4* 9.0 8.8*  MG 2.0  --   --   PHOS 3.8  --   --    Recent Labs    10/06/21 0436 10/07/21 0424  AST 727*  484*  ALT 642* 542*  ALKPHOS 790* 645*  BILITOT 23.3* 21.9*  PROT 5.7* 5.4*  ALBUMIN 2.5* 2.1*   Recent Labs    10/04/21 2229 10/05/21 0525 10/07/21 0424  WBC 6.5 7.7 15.1*  NEUTROABS 4.4  --   --   HGB 13.9 12.1* 11.8*  HCT 39.0 33.1* 33.0*  MCV 92.2 94.3 94.3  PLT 228 195 173   No results for input(s): LABPROT, INR in the last 72 hours.    Assessment/Plan: -Obstructive jaundice with concerning for pancreatic head cancer.   S/p EUS and ERCP on October 04, 2021.   Presented with worsening abdominal pain and was diagnosed with post ERCP pancreatitis.   Increasing white blood cell count 15.1 today First dose of meropenem yesterday, fever improved today.   Recommendations ------------------------- -Patient received bolus of fluid yesterday, now on 125cc -antibiotics switched to meropenem- while increase in WBC, fever and symptoms are improving, repeat tomorrow, if continues to increase consider repeat CT versus AB [place] to evaluate for abscess or pseudocyst.  -MiraLAX for constipation -Continue full liquids diet for now, can advance to soft low-fat diet when the patient is ready. -Repeat labs in the morning.  Patient's hematocrit and BUN is trending down -GI will follow   Vladimir Crofts PA-C 10/07/2021, 8:48 AM

## 2021-10-07 NOTE — Progress Notes (Signed)
   10/07/21 0534  Provider Notification  Provider Name/Title Gershon Cull, NP  Date Provider Notified 10/07/21  Time Provider Notified [phone]  Notification Type Page  Notification Reason Critical result  Test performed and critical result Total Bilirubin 21.9  Date Critical Result Received 10/07/21  Time Critical Result Received 0534  Provider response No new orders  Date of Provider Response 10/07/21  Time of Provider Response [phone]

## 2021-10-07 NOTE — Plan of Care (Signed)
  Problem: Education: Goal: Knowledge of General Education information will improve Description: Including pain rating scale, medication(s)/side effects and non-pharmacologic comfort measures Outcome: Progressing   Problem: Activity: Goal: Risk for activity intolerance will decrease Outcome: Progressing   

## 2021-10-08 DIAGNOSIS — K859 Acute pancreatitis without necrosis or infection, unspecified: Secondary | ICD-10-CM | POA: Diagnosis not present

## 2021-10-08 LAB — CBC
HCT: 29.7 % — ABNORMAL LOW (ref 39.0–52.0)
Hemoglobin: 10.8 g/dL — ABNORMAL LOW (ref 13.0–17.0)
MCH: 33.5 pg (ref 26.0–34.0)
MCHC: 36.4 g/dL — ABNORMAL HIGH (ref 30.0–36.0)
MCV: 92.2 fL (ref 80.0–100.0)
Platelets: 172 10*3/uL (ref 150–400)
RBC: 3.22 MIL/uL — ABNORMAL LOW (ref 4.22–5.81)
RDW: 20.7 % — ABNORMAL HIGH (ref 11.5–15.5)
WBC: 13.4 10*3/uL — ABNORMAL HIGH (ref 4.0–10.5)
nRBC: 0 % (ref 0.0–0.2)

## 2021-10-08 LAB — COMPREHENSIVE METABOLIC PANEL
ALT: 406 U/L — ABNORMAL HIGH (ref 0–44)
AST: 308 U/L — ABNORMAL HIGH (ref 15–41)
Albumin: 2.2 g/dL — ABNORMAL LOW (ref 3.5–5.0)
Alkaline Phosphatase: 638 U/L — ABNORMAL HIGH (ref 38–126)
Anion gap: 5 (ref 5–15)
BUN: 7 mg/dL (ref 6–20)
CO2: 26 mmol/L (ref 22–32)
Calcium: 8.6 mg/dL — ABNORMAL LOW (ref 8.9–10.3)
Chloride: 100 mmol/L (ref 98–111)
Creatinine, Ser: 0.57 mg/dL — ABNORMAL LOW (ref 0.61–1.24)
GFR, Estimated: >60 mL/min (ref >60)
Glucose, Bld: 102 mg/dL — ABNORMAL HIGH (ref 70–99)
Potassium: 3.3 mmol/L — ABNORMAL LOW (ref 3.5–5.1)
Sodium: 131 mmol/L — ABNORMAL LOW (ref 135–145)
Total Bilirubin: 20.1 mg/dL (ref 0.3–1.2)
Total Protein: 5 g/dL — ABNORMAL LOW (ref 6.5–8.1)

## 2021-10-08 LAB — CANCER ANTIGEN 19-9: CA 19-9: 85 U/mL — ABNORMAL HIGH (ref 0–35)

## 2021-10-08 MED ORDER — ENOXAPARIN SODIUM 40 MG/0.4ML IJ SOSY
40.0000 mg | PREFILLED_SYRINGE | Freq: Every day | INTRAMUSCULAR | Status: DC
  Administered 2021-10-08 – 2021-10-11 (×4): 40 mg via SUBCUTANEOUS
  Filled 2021-10-08 (×4): qty 0.4

## 2021-10-08 MED ORDER — POTASSIUM CHLORIDE CRYS ER 20 MEQ PO TBCR
40.0000 meq | EXTENDED_RELEASE_TABLET | Freq: Once | ORAL | Status: AC
  Administered 2021-10-08: 40 meq via ORAL
  Filled 2021-10-08: qty 2

## 2021-10-08 NOTE — Progress Notes (Addendum)
[facility] Progress Note  Dean [name] 55 y.o. [birth year]  CC:   Pancreatitis  Subjective: Patient seen and examined at bedside.  Feeling somewhat better.  Had low-grade fever last night.  Had bowel movement this morning without any blood in the stool.  ROS : Positive for fever.  Negative for vomiting.   Objective: Vital signs in last 24 hours: Vitals:   10/07/21 1955 10/08/21 0340  BP: (!) 149/91 136/89  Pulse: 95 83  Resp: 20   Temp: (!) 100.5 F (38.1 C) 99.2 F (37.3 C)  SpO2: 98% 99%    Physical Exam:  General:  Alert, cooperative, no distress, appears stated age  Head:  Normocephalic, without obvious abnormality, atraumatic  Eyes:  , EOM's intact,   Lungs:   Clear to auscultation bilaterally, respirations unlabored  Heart:  Regular rate and rhythm, S1, S2 normal  Abdomen:   Soft, non-tender, bowel sounds active all four quadrants,  no masses,   Extremities: Extremities normal, atraumatic, no  edema  Pulses: 2+ and symmetric    Lab Results: Recent Labs    10/07/21 0424 10/08/21 0421  NA 132* 131*  K 4.4 3.3*  CL 101 100  CO2 24 26  GLUCOSE 101* 102*  BUN 7 7  CREATININE 0.50* 0.57*  CALCIUM 8.8* 8.6*   Recent Labs    10/07/21 0424 10/08/21 0421  AST 484* 308*  ALT 542* 406*  ALKPHOS 645* 638*  BILITOT 21.9* 20.1*  PROT 5.4* 5.0*  ALBUMIN 2.1* 2.2*   Recent Labs    10/07/21 0424 10/08/21 0421  WBC 15.1* 13.4*  HGB 11.8* 10.8*  HCT 33.0* 29.7*  MCV 94.3 92.2  PLT 173 172   No results for input(s): LABPROT, INR in the last 72 hours.    Assessment/Plan: Labs reviewed.  FNA from EUS showed atypical cell concerning for malignancy .  Cytology from brushings negative for malignancy.     Assessment ------------------ -Obstructive jaundice with concerning for pancreatic head cancer.  S/p EUS and ERCP on October 04, 2021.  PD as well as CBD stent placement during ERCP presented with worsening abdominal pain and was  diagnosed with post ERCP pancreatitis.    -T bili is slowly improving but remains significantly elevated around 20.   Recommendations ------------------------- -Follow CA 19-9 -Continue meropenem -Advance diet to soft food -Ambulate with assistance -Continue other supportive care including gentle IV hydration -He may need repeat EUS for definitive diagnosis and eventually a repeat ERCP for metal stent placement in the CBD  Our PA  has discussed his case with the surgery PA.  They will arrange for outpatient follow-up for evaluation of surgical options  -Management options were discussed with patient's wife over the phone. -GI will follow   Otis Brace MD, [place] 10/08/2021, 10:45 AM  Contact #  [phone]

## 2021-10-08 NOTE — Plan of Care (Signed)
  Problem: Clinical Measurements: Goal: Diagnostic test results will improve Outcome: Progressing Goal: Respiratory complications will improve Outcome: Progressing   Problem: Activity: Goal: Risk for activity intolerance will decrease Outcome: Progressing   Problem: Pain Managment: Goal: General experience of comfort will improve Outcome: Progressing

## 2021-10-08 NOTE — Progress Notes (Signed)
PROGRESS NOTE    Dean [name]  EXH:[id number] DOB: [birth year] DOA: 10/04/2021 PCP: Maude Leriche, PA-C    Brief Narrative:  55 y.o. male with medical history significant for bipolar disorder, hypothyroidism, abnormal MRI/MRCP concerning for biliary obstruction post ERCP on 10/04/2021 who presented to [facility] ED due to severe lower abdominal pain post ERCP, found to have acute pancreatitis  Assessment & Plan:   Principal Problem:   Acute pancreatitis Active Problems:   Bipolar disorder ([place])   Hypothyroidism  Acute pancreatitis post ERCP Developed severe abdominal pain post ERCP on 10/04/2021 Acute pancreatitis without necrosis seen on CT scan Presented with lipase level 4625, now trending down  Advancing diet per gastroenterology  Acute transaminitis with jaundice LFT's initially trended up, now trending down Avoid hepatotoxic agents Repeat LFT's in AM Bilirubin peaked at 29, now 20.1   Dilated common bile duct and abnormal MRI/MRCP concerning for biliary obstruction post ERCP on 10/04/2021 with stents placement Per ERCP report:  - The major papilla appeared normal. - One pancreatic stent was placed into the ventral pancreatic duct. - A short precut biliary sphincterotomy was performed. - One stent was removed from Duodenum. - One pancreatic stent was placed into the ventral pancreatic duct. - A biliary sphincterotomy was performed. - Cells for cytology obtained in the lower third of the main duct. - One plastic stent was placed into the common bile duct. Bilirubin now trending down per above Per gastroenterology, await CA 19-9 levels May also require repeat EUS for definitive diagnosis and eventually repeat ERCP for metal stent placement Anticipating close outpatient follow-up with gastroenterology when ultimately discharged   Hyperglycemia Random glucose stable this AM A1c of 5.1   Mild anion gap metabolic acidosis Presented with serum bicarb 21 and anion  gap of 13 Continue IV fluid normal saline as tolerated   Bipolar disorder Resume home Depakote   Hypothyroidism Continued home levothyroxine as tolerated    DVT prophylaxis: SCD's Code Status: Full Family Communication: Pt in room, family currently not at bedside  Status is: Inpatient  Remains inpatient appropriate because: Acuity of illness     Consultants:  GI  Procedures:    Antimicrobials: Anti-infectives (From admission, onward)    Start     Dose/Rate Route Frequency Ordered Stop   10/06/21 2000  meropenem (MERREM) 1 g in sodium chloride 0.9 % 100 mL IVPB        1 g 200 mL/hr over 30 Minutes Intravenous Every 8 hours 10/06/21 1742     10/05/21 2230  piperacillin-tazobactam (ZOSYN) IVPB 3.375 g  Status:  Discontinued        3.375 g 12.5 mL/hr over 240 Minutes Intravenous Every 8 hours 10/05/21 2143 10/06/21 1621   10/05/21 0030  piperacillin-tazobactam (ZOSYN) IVPB 3.375 g        3.375 g 100 mL/hr over 30 Minutes Intravenous  Once 10/05/21 0017 10/05/21 0129       Subjective: States feeling better, ambulating  Objective: Vitals:   10/07/21 1242 10/07/21 1955 10/08/21 0340 10/08/21 1344  BP: 132/74 (!) 149/91 136/89 133/88  Pulse: 83 95 83 76  Resp: 20 20  16   Temp: 99.1 F (37.3 C) (!) 100.5 F (38.1 C) 99.2 F (37.3 C) 98.4 F (36.9 C)  TempSrc: Oral Oral Oral Oral  SpO2: 96% 98% 99% 99%  Weight:      Height:        Intake/Output Summary (Last 24 hours) at 10/08/2021 1532 Last data filed at 10/08/2021 3810 Gross  per 24 hour  Intake 3439.57 ml  Output --  Net 3439.57 ml    Filed Weights   10/04/21 2303  Weight: 80.3 kg    Examination: General exam: Awake, laying in bed, in nad Respiratory system: Normal respiratory effort, no wheezing Cardiovascular system: regular rate, s1, s2 Gastrointestinal system: Soft, nondistended, positive BS Central nervous system: CN2-12 grossly intact, strength intact Extremities: Perfused, no  clubbing Skin: Normal skin turgor, no notable skin lesions seen, jaundiced Psychiatry: Mood normal // no visual hallucinations   Data Reviewed: I have personally reviewed following labs and imaging studies  CBC: Recent Labs  Lab 10/04/21 2229 10/05/21 0525 10/07/21 0424 10/08/21 0421  WBC 6.5 7.7 15.1* 13.4*  NEUTROABS 4.4  --   --   --   HGB 13.9 12.1* 11.8* 10.8*  HCT 39.0 33.1* 33.0* 29.7*  MCV 92.2 94.3 94.3 92.2  PLT 228 195 173 093    Basic Metabolic Panel: Recent Labs  Lab 10/04/21 2229 10/05/21 0525 10/06/21 0436 10/07/21 0424 10/08/21 0421  NA 130* 133* 131* 132* 131*  K 4.1 3.7 3.9 4.4 3.3*  CL 96* 102 99 101 100  CO2 21* 24 26 24 26   GLUCOSE 159* 128* 95 101* 102*  BUN 13 13 8 7 7   CREATININE 0.66 0.65 0.62 0.50* 0.57*  CALCIUM 9.3 8.4* 9.0 8.8* 8.6*  MG  --  2.0  --   --   --   PHOS  --  3.8  --   --   --     GFR: Estimated Creatinine Clearance: 111.1 mL/min (A) (by C-G formula based on SCr of 0.57 mg/dL (L)). Liver Function Tests: Recent Labs  Lab 10/04/21 2229 10/05/21 0525 10/06/21 0436 10/07/21 0424 10/08/21 0421  AST 339* 341* 727* 484* 308*  ALT 320* 307* 642* 542* 406*  ALKPHOS 592* 509* 790* 645* 638*  BILITOT 29.2* 23.4* 23.3* 21.9* 20.1*  PROT 6.6 5.4* 5.7* 5.4* 5.0*  ALBUMIN 2.9* 2.3* 2.5* 2.1* 2.2*    Recent Labs  Lab 10/04/21 2229 10/05/21 0525 10/06/21 0436 10/07/21 0424  LIPASE 4,625* 1,638* 689* 79*    No results for input(s): AMMONIA in the last 168 hours. Coagulation Profile: No results for input(s): INR, PROTIME in the last 168 hours. Cardiac Enzymes: No results for input(s): CKTOTAL, CKMB, CKMBINDEX, TROPONINI in the last 168 hours. BNP (last 3 results) No results for input(s): PROBNP in the last 8760 hours. HbA1C: No results for input(s): HGBA1C in the last 72 hours.  CBG: No results for input(s): GLUCAP in the last 168 hours. Lipid Profile: No results for input(s): CHOL, HDL, LDLCALC, TRIG, CHOLHDL,  LDLDIRECT in the last 72 hours.  Thyroid Function Tests: No results for input(s): TSH, T4TOTAL, FREET4, T3FREE, THYROIDAB in the last 72 hours. Anemia Panel: No results for input(s): VITAMINB12, FOLATE, FERRITIN, TIBC, IRON, RETICCTPCT in the last 72 hours. Sepsis Labs: Recent Labs  Lab 10/04/21 2301 10/05/21 0205  LATICACIDVEN 1.5 1.1     Recent Results (from the past 240 hour(s))  Blood culture (routine x 2)     Status: None (Preliminary result)   Collection Time: 10/04/21 11:01 PM   Specimen: BLOOD  Result Value Ref Range Status   Specimen Description   Final    BLOOD LEFT ANTECUBITAL Performed at [facility], [place] [address] [place], [place] [address]    Special Requests   Final    BOTTLES DRAWN AEROBIC AND ANAEROBIC Blood Culture adequate volume Performed at [facility], [place] [address] [place], [place] [address]    Culture   Final    NO GROWTH 3 DAYS Performed at [facility], [place] [address], [place], [place] [address]    Report Status PENDING  Incomplete  Blood culture (routine x 2)     Status: None (Preliminary result)   Collection Time: 10/04/21 11:06 PM   Specimen: BLOOD  Result Value Ref Range Status   Specimen Description   Final    BLOOD RIGHT ANTECUBITAL Performed at [place] [address], [place], [place] [address]    Special Requests   Final    BOTTLES DRAWN AEROBIC AND ANAEROBIC Blood Culture adequate volume Performed at [place] [address], [place], [place] [address]    Culture   Final    NO GROWTH 3 DAYS Performed at [facility], [place] [address] [place],  [address]    Report Status PENDING  Incomplete  Resp Panel by RT-PCR (Flu A&B, Covid) Nasopharyngeal Swab     Status: None   Collection Time: 10/05/21  3:56 AM   Specimen: Nasopharyngeal Swab; Nasopharyngeal(NP) swabs in vial transport medium  Result Value Ref Range Status   SARS  Coronavirus 2 by RT PCR NEGATIVE NEGATIVE Final    Comment: (NOTE) SARS-CoV-2 target nucleic acids are NOT DETECTED.  The SARS-CoV-2 RNA is generally detectable in upper respiratory specimens during the acute phase of infection. The lowest concentration of SARS-CoV-2 viral copies this assay can detect is 138 copies/mL. A negative result does not preclude SARS-Cov-2 infection and should not be used as the sole basis for treatment or other patient management decisions. A negative result may occur with  improper specimen collection/handling, submission of specimen other than nasopharyngeal swab, presence of viral mutation(s) within the areas targeted by this assay, and inadequate number of viral copies(<138 copies/mL). A negative result must be combined with clinical observations, patient history, and epidemiological information. The expected result is Negative.  Fact Sheet for Patients:  EntrepreneurPulse.com.au  Fact Sheet for Healthcare Providers:  IncredibleEmployment.be  This test is no t yet approved or cleared by the [place] FDA and  has been authorized for detection and/or diagnosis of SARS-CoV-2 by FDA under an Emergency Use Authorization (EUA). This EUA will remain  in effect (meaning this test can be used) for the duration of the COVID-19 declaration under Section 564(b)(1) of the Act, 21 U.S.C.section 360bbb-3(b)(1), unless the authorization is terminated  or revoked sooner.       Influenza A by PCR NEGATIVE NEGATIVE Final   Influenza B by PCR NEGATIVE NEGATIVE Final    Comment: (NOTE) The Xpert Xpress SARS-CoV-2/FLU/RSV plus assay is intended as an aid in the diagnosis of influenza from Nasopharyngeal swab specimens and should not be used as a sole basis for treatment. Nasal washings and aspirates are unacceptable for Xpert Xpress SARS-CoV-2/FLU/RSV testing.  Fact Sheet for  Patients: EntrepreneurPulse.com.au  Fact Sheet for Healthcare Providers: IncredibleEmployment.be  This test is not yet approved or cleared by the [place] FDA and has been authorized for detection and/or diagnosis of SARS-CoV-2 by FDA under an Emergency Use Authorization (EUA). This EUA will remain in effect (meaning this test can be used) for the duration of the COVID-19 declaration under Section 564(b)(1) of the Act, 21 U.S.C. section 360bbb-3(b)(1), unless the authorization is terminated or revoked.  Performed at [facility], [place] [address], [place],  [address]  Radiology Studies: No results found.  Scheduled Meds:  divalproex  2,000 mg Oral QHS   enoxaparin (LOVENOX) injection  40 mg Subcutaneous Daily   levothyroxine  137 mcg Oral QAC breakfast   pantoprazole  40 mg Oral Daily   polyethylene glycol  17 g Oral BID   Continuous Infusions:  sodium chloride 125 mL/hr at 10/08/21 0629   meropenem (MERREM) IV 1 g (10/08/21 1303)     LOS: 3 days   Marylu Lund, MD [facility] Pager On Amion  If 7PM-7AM, please contact night-coverage 10/08/2021, 3:32 PM

## 2021-10-09 DIAGNOSIS — K859 Acute pancreatitis without necrosis or infection, unspecified: Secondary | ICD-10-CM | POA: Diagnosis not present

## 2021-10-09 LAB — PREALBUMIN: Prealbumin: <5 mg/dL — ABNORMAL LOW (ref 18–38)

## 2021-10-09 LAB — COMPREHENSIVE METABOLIC PANEL
ALT: 342 U/L — ABNORMAL HIGH (ref 0–44)
AST: 239 U/L — ABNORMAL HIGH (ref 15–41)
Albumin: 2 g/dL — ABNORMAL LOW (ref 3.5–5.0)
Alkaline Phosphatase: 612 U/L — ABNORMAL HIGH (ref 38–126)
Anion gap: 7 (ref 5–15)
BUN: 8 mg/dL (ref 6–20)
CO2: 26 mmol/L (ref 22–32)
Calcium: 8.7 mg/dL — ABNORMAL LOW (ref 8.9–10.3)
Chloride: 99 mmol/L (ref 98–111)
Creatinine, Ser: 0.65 mg/dL (ref 0.61–1.24)
GFR, Estimated: >60 mL/min (ref >60)
Glucose, Bld: 102 mg/dL — ABNORMAL HIGH (ref 70–99)
Potassium: 3.5 mmol/L (ref 3.5–5.1)
Sodium: 132 mmol/L — ABNORMAL LOW (ref 135–145)
Total Bilirubin: 17.1 mg/dL — ABNORMAL HIGH (ref 0.3–1.2)
Total Protein: 5.7 g/dL — ABNORMAL LOW (ref 6.5–8.1)

## 2021-10-09 LAB — CBC
HCT: 28.4 % — ABNORMAL LOW (ref 39.0–52.0)
Hemoglobin: 10.3 g/dL — ABNORMAL LOW (ref 13.0–17.0)
MCH: 33.4 pg (ref 26.0–34.0)
MCHC: 36.3 g/dL — ABNORMAL HIGH (ref 30.0–36.0)
MCV: 92.2 fL (ref 80.0–100.0)
Platelets: 210 10*3/uL (ref 150–400)
RBC: 3.08 MIL/uL — ABNORMAL LOW (ref 4.22–5.81)
RDW: 21.2 % — ABNORMAL HIGH (ref 11.5–15.5)
WBC: 11.9 10*3/uL — ABNORMAL HIGH (ref 4.0–10.5)
nRBC: 0 % (ref 0.0–0.2)

## 2021-10-09 NOTE — Progress Notes (Signed)
Subjective: Less pain.  Objective: Vital signs in last 24 hours: Temp:  [98.4 F (36.9 C)-99.4 F (37.4 C)] 99.4 F (37.4 C) (11/05 0457) Pulse Rate:  [70-87] 70 (11/05 0457) Resp:  [14-16] 16 (11/05 0457) BP: (133-137)/(79-88) 133/79 (11/05 0457) SpO2:  [97 %-99 %] 98 % (11/05 0457) Weight change:  Last BM Date: 10/09/21  PE: GEN:  Jaundiced, NAD  Lab Results: CBC    Component Value Date/Time   WBC 11.9 (H) 10/09/2021 0511   RBC 3.08 (L) 10/09/2021 0511   HGB 10.3 (L) 10/09/2021 0511   HCT 28.4 (L) 10/09/2021 0511   PLT 210 10/09/2021 0511   MCV 92.2 10/09/2021 0511   MCH 33.4 10/09/2021 0511   MCHC 36.3 (H) 10/09/2021 0511   RDW 21.2 (H) 10/09/2021 0511   LYMPHSABS 1.1 10/04/2021 2229   MONOABS 0.7 10/04/2021 2229   EOSABS 0.0 10/04/2021 2229   BASOSABS 0.1 10/04/2021 2229   CMP     Component Value Date/Time   NA 132 (L) 10/09/2021 0511   K 3.5 10/09/2021 0511   CL 99 10/09/2021 0511   CO2 26 10/09/2021 0511   GLUCOSE 102 (H) 10/09/2021 0511   BUN 8 10/09/2021 0511   CREATININE 0.65 10/09/2021 0511   CALCIUM 8.7 (L) 10/09/2021 0511   PROT 5.7 (L) 10/09/2021 0511   ALBUMIN 2.0 (L) 10/09/2021 0511   AST 239 (H) 10/09/2021 0511   ALT 342 (H) 10/09/2021 0511   ALKPHOS 612 (H) 10/09/2021 0511   BILITOT 17.1 (H) 10/09/2021 0511   GFRNONAA >60 10/09/2021 0511   GFRAA >60 08/19/2015 1925    Assessment:   Obstructive jaundice, suspected pancreatic AC, EUS + ERCP.  Post-ERCP pancreatitis.  Plan:   Continue antibiotics and supportive care.  Follow clinically and LFT trend.  LFTs slowly coming down, hopefully this trend continues.  Dr. Barry Dienes in room at time of my visit, discussing surgical options. [facility] will revisit Monday.   Dean [name] 10/09/2021, 10:26 AM   Cell [phone] If no answer or after 5 PM call [phone]

## 2021-10-09 NOTE — Progress Notes (Signed)
PROGRESS NOTE    Dean [name]  XTK:[id number] DOB: [birth year] DOA: 10/04/2021 PCP: Maude Leriche, PA-C    Brief Narrative:  55 y.o. male with medical history significant for bipolar disorder, hypothyroidism, abnormal MRI/MRCP concerning for biliary obstruction post ERCP on 10/04/2021 who presented to [facility] ED due to severe lower abdominal pain post ERCP, found to have acute pancreatitis  Assessment & Plan:   Principal Problem:   Acute pancreatitis Active Problems:   Bipolar disorder ([place])   Hypothyroidism  Acute pancreatitis post ERCP Developed severe abdominal pain post ERCP on 10/04/2021 Acute pancreatitis without necrosis seen on CT scan Presented with lipase level 4625, now trending down  Advancing diet per gastroenterology  Acute transaminitis with jaundice LFT's initially trended up, steadily trending down Avoid hepatotoxic agents Repeat LFT's in AM Bilirubin peaked at 29, now down to 17.1   Dilated common bile duct and abnormal MRI/MRCP concerning for biliary obstruction post ERCP on 10/04/2021 with stents placement Per ERCP report:  - The major papilla appeared normal. - One pancreatic stent was placed into the ventral pancreatic duct. - A short precut biliary sphincterotomy was performed. - One stent was removed from Duodenum. - One pancreatic stent was placed into the ventral pancreatic duct. - A biliary sphincterotomy was performed. - Cells for cytology obtained in the lower third of the main duct. - One plastic stent was placed into the common bile duct. Bilirubin now trending down per above CA19-9 elevated at 85 Per GI, may also require repeat EUS for definitive diagnosis and eventually repeat ERCP for metal stent placement Seen by General Surgery, who was consulted by GI regarding surgical management   Hyperglycemia Random glucose stable this AM A1c of 5.1   Mild anion gap metabolic acidosis Presented with serum bicarb 21 and anion gap of  13 Improved with IVF hydration   Bipolar disorder Continued on home Depakote   Hypothyroidism Continued home levothyroxine as tolerated    DVT prophylaxis: SCD's Code Status: Full Family Communication: Pt in room, family currently not at bedside  Status is: Inpatient  Remains inpatient appropriate because: Acuity of illness     Consultants:  GI  Procedures:    Antimicrobials: Anti-infectives (From admission, onward)    Start     Dose/Rate Route Frequency Ordered Stop   10/06/21 2000  meropenem (MERREM) 1 g in sodium chloride 0.9 % 100 mL IVPB        1 g 200 mL/hr over 30 Minutes Intravenous Every 8 hours 10/06/21 1742     10/05/21 2230  piperacillin-tazobactam (ZOSYN) IVPB 3.375 g  Status:  Discontinued        3.375 g 12.5 mL/hr over 240 Minutes Intravenous Every 8 hours 10/05/21 2143 10/06/21 1621   10/05/21 0030  piperacillin-tazobactam (ZOSYN) IVPB 3.375 g        3.375 g 100 mL/hr over 30 Minutes Intravenous  Once 10/05/21 0017 10/05/21 0129       Subjective: Denies abd pain, ambulating, no nausea  Objective: Vitals:   10/08/21 1344 10/08/21 2020 10/09/21 0457 10/09/21 1320  BP: 133/88 137/85 133/79 132/80  Pulse: 76 87 70 73  Resp: 16 14 16 16   Temp: 98.4 F (36.9 C) 99.2 F (37.3 C) 99.4 F (37.4 C) 98.6 F (37 C)  TempSrc: Oral Oral Oral Oral  SpO2: 99% 97% 98% 97%  Weight:      Height:        Intake/Output Summary (Last 24 hours) at 10/09/2021 1445 Last data filed at  10/09/2021 1300 Gross per 24 hour  Intake 3743.53 ml  Output --  Net 3743.53 ml    Filed Weights   10/04/21 2303  Weight: 80.3 kg    Examination: General exam: Conversant, in no acute distress Respiratory system: normal chest rise, clear, no audible wheezing Cardiovascular system: regular rhythm, s1-s2 Gastrointestinal system: Nondistended, nontender, pos BS Central nervous system: No seizures, no tremors Extremities: No cyanosis, no joint deformities Skin: No  rashes, no pallor, jaundice Psychiatry: Affect normal // no auditory hallucinations   Data Reviewed: I have personally reviewed following labs and imaging studies  CBC: Recent Labs  Lab 10/04/21 2229 10/05/21 0525 10/07/21 0424 10/08/21 0421 10/09/21 0511  WBC 6.5 7.7 15.1* 13.4* 11.9*  NEUTROABS 4.4  --   --   --   --   HGB 13.9 12.1* 11.8* 10.8* 10.3*  HCT 39.0 33.1* 33.0* 29.7* 28.4*  MCV 92.2 94.3 94.3 92.2 92.2  PLT 228 195 173 172 454    Basic Metabolic Panel: Recent Labs  Lab 10/05/21 0525 10/06/21 0436 10/07/21 0424 10/08/21 0421 10/09/21 0511  NA 133* 131* 132* 131* 132*  K 3.7 3.9 4.4 3.3* 3.5  CL 102 99 101 100 99  CO2 24 26 24 26 26   GLUCOSE 128* 95 101* 102* 102*  BUN 13 8 7 7 8   CREATININE 0.65 0.62 0.50* 0.57* 0.65  CALCIUM 8.4* 9.0 8.8* 8.6* 8.7*  MG 2.0  --   --   --   --   PHOS 3.8  --   --   --   --     GFR: Estimated Creatinine Clearance: 111.1 mL/min (by C-G formula based on SCr of 0.65 mg/dL). Liver Function Tests: Recent Labs  Lab 10/05/21 0525 10/06/21 0436 10/07/21 0424 10/08/21 0421 10/09/21 0511  AST 341* 727* 484* 308* 239*  ALT 307* 642* 542* 406* 342*  ALKPHOS 509* 790* 645* 638* 612*  BILITOT 23.4* 23.3* 21.9* 20.1* 17.1*  PROT 5.4* 5.7* 5.4* 5.0* 5.7*  ALBUMIN 2.3* 2.5* 2.1* 2.2* 2.0*    Recent Labs  Lab 10/04/21 2229 10/05/21 0525 10/06/21 0436 10/07/21 0424  LIPASE 4,625* 1,638* 689* 79*    No results for input(s): AMMONIA in the last 168 hours. Coagulation Profile: No results for input(s): INR, PROTIME in the last 168 hours. Cardiac Enzymes: No results for input(s): CKTOTAL, CKMB, CKMBINDEX, TROPONINI in the last 168 hours. BNP (last 3 results) No results for input(s): PROBNP in the last 8760 hours. HbA1C: No results for input(s): HGBA1C in the last 72 hours.  CBG: No results for input(s): GLUCAP in the last 168 hours. Lipid Profile: No results for input(s): CHOL, HDL, LDLCALC, TRIG, CHOLHDL, LDLDIRECT  in the last 72 hours.  Thyroid Function Tests: No results for input(s): TSH, T4TOTAL, FREET4, T3FREE, THYROIDAB in the last 72 hours. Anemia Panel: No results for input(s): VITAMINB12, FOLATE, FERRITIN, TIBC, IRON, RETICCTPCT in the last 72 hours. Sepsis Labs: Recent Labs  Lab 10/04/21 2301 10/05/21 0205  LATICACIDVEN 1.5 1.1     Recent Results (from the past 240 hour(s))  Blood culture (routine x 2)     Status: None (Preliminary result)   Collection Time: 10/04/21 11:01 PM   Specimen: BLOOD  Result Value Ref Range Status   Specimen Description   Final    BLOOD LEFT ANTECUBITAL Performed at [facility], [place] [address] [place],  [address]    Special Requests   Final    BOTTLES DRAWN AEROBIC AND ANAEROBIC  Blood Culture adequate volume Performed at [place] [address], [place], [place] [address]    Culture   Final    NO GROWTH 4 DAYS Performed at [facility], [place] [address] [place], [place] [address]    Report Status PENDING  Incomplete  Blood culture (routine x 2)     Status: None (Preliminary result)   Collection Time: 10/04/21 11:06 PM   Specimen: BLOOD  Result Value Ref Range Status   Specimen Description   Final    BLOOD RIGHT ANTECUBITAL Performed at [place] [address] [place], [place] [address]    Special Requests   Final    BOTTLES DRAWN AEROBIC AND ANAEROBIC Blood Culture adequate volume Performed at [place] [address], [place], [place] [address]    Culture   Final    NO GROWTH 4 DAYS Performed at [facility], [place] [address] [place], [place] [address]    Report Status PENDING  Incomplete  Resp Panel by RT-PCR (Flu A&B, Covid) Nasopharyngeal Swab     Status: None   Collection Time: 10/05/21  3:56 AM   Specimen: Nasopharyngeal Swab; Nasopharyngeal(NP) swabs in vial transport medium  Result Value Ref Range Status   SARS Coronavirus  2 by RT PCR NEGATIVE NEGATIVE Final    Comment: (NOTE) SARS-CoV-2 target nucleic acids are NOT DETECTED.  The SARS-CoV-2 RNA is generally detectable in upper respiratory specimens during the acute phase of infection. The lowest concentration of SARS-CoV-2 viral copies this assay can detect is 138 copies/mL. A negative result does not preclude SARS-Cov-2 infection and should not be used as the sole basis for treatment or other patient management decisions. A negative result may occur with  improper specimen collection/handling, submission of specimen other than nasopharyngeal swab, presence of viral mutation(s) within the areas targeted by this assay, and inadequate number of viral copies(<138 copies/mL). A negative result must be combined with clinical observations, patient history, and epidemiological information. The expected result is Negative.  Fact Sheet for Patients:  EntrepreneurPulse.com.au  Fact Sheet for Healthcare Providers:  IncredibleEmployment.be  This test is no t yet approved or cleared by the [place] FDA and  has been authorized for detection and/or diagnosis of SARS-CoV-2 by FDA under an Emergency Use Authorization (EUA). This EUA will remain  in effect (meaning this test can be used) for the duration of the COVID-19 declaration under Section 564(b)(1) of the Act, 21 U.S.C.section 360bbb-3(b)(1), unless the authorization is terminated  or revoked sooner.       Influenza A by PCR NEGATIVE NEGATIVE Final   Influenza B by PCR NEGATIVE NEGATIVE Final    Comment: (NOTE) The Xpert Xpress SARS-CoV-2/FLU/RSV plus assay is intended as an aid in the diagnosis of influenza from Nasopharyngeal swab specimens and should not be used as a sole basis for treatment. Nasal washings and aspirates are unacceptable for Xpert Xpress SARS-CoV-2/FLU/RSV testing.  Fact Sheet for Patients: EntrepreneurPulse.com.au  Fact  Sheet for Healthcare Providers: IncredibleEmployment.be  This test is not yet approved or cleared by the [place] FDA and has been authorized for detection and/or diagnosis of SARS-CoV-2 by FDA under an Emergency Use Authorization (EUA). This EUA will remain in effect (meaning this test can be used) for the duration of the COVID-19 declaration under Section 564(b)(1) of the Act, 21 U.S.C. section 360bbb-3(b)(1), unless the authorization is terminated or revoked.  Performed at [facility], [place] [address]  [place], [place] [address]       Radiology Studies: No results found.  Scheduled Meds:  divalproex  2,000 mg Oral QHS   enoxaparin (LOVENOX) injection  40 mg Subcutaneous Daily   levothyroxine  137 mcg Oral QAC breakfast   pantoprazole  40 mg Oral Daily   polyethylene glycol  17 g Oral BID   Continuous Infusions:  sodium chloride 125 mL/hr at 10/09/21 1140   meropenem (MERREM) IV 1 g (10/09/21 1440)     LOS: 4 days   Marylu Lund, MD [facility] Pager On Amion  If 7PM-7AM, please contact night-coverage 10/09/2021, 2:45 PM

## 2021-10-09 NOTE — Consult Note (Signed)
Reason for Consult:Malignant biliary stricture Referring Physician: [name] is an 55 y.o. male.  HPI:  Pt is a 55 yo M who developed elevated LFTs and had intra/extrahepatic biliary dilation noted in mid October 2022 on RUQ u/s.  He subsequently had an MRCP demonstrating a stricture in the distal common bile duct in the pancreas. Imaging has been vague regarding whether there is a true mass present.  This includes MRI and EUS as well as CT (regular contrasted CT, not triple phase).  He was admitted 10/31 after he developed post ERCP pancreatitis.  His t bili is coming down from 29.2 and is now 17.1.  He has no family history of cancer or pancreatitis of which he is aware..  He has had about a 15-20 pound weight loss since dx.  He works as a Librarian, academic in Water quality scientist. He has never had pancreatitis before.  He drank around 6 beers per day for around 18 months prior to this dx.    Past Medical History:  Diagnosis Date   Bipolar 1 disorder ([place])    Thyroid disease     Past Surgical History:  Procedure Laterality Date   BACK SURGERY     BILIARY BRUSHING  10/04/2021   Procedure: BILIARY BRUSHING;  Surgeon: Clarene Essex, MD;  Location: [facility];  Service: Endoscopy;;   BILIARY STENT PLACEMENT N/A 10/04/2021   Procedure: BILIARY STENT PLACEMENT;  Surgeon: Clarene Essex, MD;  Location: [facility];  Service: Endoscopy;  Laterality: N/A;   ERCP Bilateral 10/04/2021   Procedure: ENDOSCOPIC RETROGRADE CHOLANGIOPANCREATOGRAPHY (ERCP);  Surgeon: Clarene Essex, MD;  Location: [facility];  Service: Endoscopy;  Laterality: Bilateral;   ESOPHAGOGASTRODUODENOSCOPY (EGD) WITH PROPOFOL N/A 10/04/2021   Procedure: ESOPHAGOGASTRODUODENOSCOPY (EGD) WITH PROPOFOL;  Surgeon: Clarene Essex, MD;  [facility];  Service: Endoscopy;  Laterality: N/A;   FINE NEEDLE ASPIRATION  10/04/2021   Procedure: FINE NEEDLE ASPIRATION (FNA) LINEAR;  Surgeon: Clarene Essex, MD;  [facility];   Service: Endoscopy;;   FOREIGN BODY REMOVAL  10/04/2021   Procedure: FOREIGN BODY REMOVAL;  Surgeon: Clarene Essex, MD;  [facility];  Service: Endoscopy;;   KNEE SURGERY     NOSE SURGERY     PANCREATIC STENT PLACEMENT  10/04/2021   Procedure: PANCREATIC STENT PLACEMENT;  Surgeon: Clarene Essex, MD;  [facility];  Service: Endoscopy;;   ROTATOR CUFF REPAIR     SPHINCTEROTOMY  10/04/2021   Procedure: Joan Mayans;  Surgeon: Clarene Essex, MD;  [facility];  Service: Endoscopy;;   STENT REMOVAL  10/04/2021   Procedure: STENT REMOVAL;  Surgeon: Clarene Essex, MD;  [facility];  Service: Endoscopy;;   UPPER ESOPHAGEAL ENDOSCOPIC ULTRASOUND (EUS)  10/04/2021   Procedure: UPPER ESOPHAGEAL ENDOSCOPIC ULTRASOUND (EUS);  Surgeon: Clarene Essex, MD;  Location[facility];  Service: Endoscopy;;    History reviewed. No pertinent family history.  Social History:  reports that he has quit smoking. He has never used smokeless tobacco. He reports current alcohol use. He reports that he does not use drugs.  Allergies:  Allergies  Allergen Reactions   Erythromycin Nausea Only   Ibuprofen Other (See Comments)    Peptic Ulcer nsaids    Medications:  Current Meds  Medication Sig   divalproex (DEPAKOTE ER) 500 MG 24 hr tablet TAKE 5 TABLETS (2,500 MG TOTAL) BY MOUTH DAILY. (Patient taking differently: Take 2,000 mg by mouth at bedtime.)   levothyroxine (SYNTHROID) 137 MCG tablet Take 137 mcg by mouth daily  before breakfast.   Multiple Vitamins-Minerals (ONE DAILY MENS 50+ MULTIVIT) TABS Take 1 tablet by mouth daily.     Results for orders placed or performed during the hospital encounter of 10/04/21 (from the past 48 hour(s))  Cancer antigen 19-9     Status: Abnormal   Collection Time: 10/07/21  1:14 PM  Result Value Ref Range   CA 19-9 85 (H) 0 - 35 U/mL    Comment: (NOTE) **Verified by repeat analysis** Roche Diagnostics Electrochemiluminescence  Immunoassay (ECLIA) Values obtained with different assay methods or kits cannot be used interchangeably.  Results cannot be interpreted as absolute evidence of the presence or absence of malignant disease. Performed At: [facility] [place], [place] [id number] Rush Farmer MD PQ:[id number]   Comprehensive metabolic panel     Status: Abnormal   Collection Time: 10/08/21  4:21 AM  Result Value Ref Range   Sodium 131 (L) 135 - 145 mmol/L   Potassium 3.3 (L) 3.5 - 5.1 mmol/L    Comment: DELTA CHECK NOTED   Chloride 100 98 - 111 mmol/L   CO2 26 22 - 32 mmol/L   Glucose, Bld 102 (H) 70 - 99 mg/dL    Comment: Glucose reference range applies only to samples taken after fasting for at least 8 hours.   BUN 7 6 - 20 mg/dL   Creatinine, Ser 0.57 (L) 0.61 - 1.24 mg/dL   Calcium 8.6 (L) 8.9 - 10.3 mg/dL   Total Protein 5.0 (L) 6.5 - 8.1 g/dL   Albumin 2.2 (L) 3.5 - 5.0 g/dL   AST 308 (H) 15 - 41 U/L   ALT 406 (H) 0 - 44 U/L   Alkaline Phosphatase 638 (H) 38 - 126 U/L   Total Bilirubin 20.1 (HH) 0.3 - 1.2 mg/dL    Comment: CRITICAL RESULT CALLED TO, READ BACK BY AND VERIFIED WITH:  CHLOE ADDISON RN 10/08/21 @ 0507 VS    GFR, Estimated >60 >60 mL/min    Comment: (NOTE) Calculated using the CKD-EPI Creatinine Equation (2021)    Anion gap 5 5 - 15    Comment: Performed at [facility], [place] [address], [place], [place] [address]  CBC     Status: Abnormal   Collection Time: 10/08/21  4:21 AM  Result Value Ref Range   WBC 13.4 (H) 4.0 - 10.5 K/uL   RBC 3.22 (L) 4.22 - 5.81 MIL/uL   Hemoglobin 10.8 (L) 13.0 - 17.0 g/dL   HCT 29.7 (L) 39.0 - 52.0 %   MCV 92.2 80.0 - 100.0 fL   MCH 33.5 26.0 - 34.0 pg   MCHC 36.4 (H) 30.0 - 36.0 g/dL   RDW 20.7 (H) 11.5 - 15.5 %   Platelets 172 150 - 400 K/uL   nRBC 0.0 0.0 - 0.2 %    Comment: Performed at [facility], [place] [address] [place],  [address]  CBC     Status: Abnormal    Collection Time: 10/09/21  5:11 AM  Result Value Ref Range   WBC 11.9 (H) 4.0 - 10.5 K/uL   RBC 3.08 (L) 4.22 - 5.81 MIL/uL   Hemoglobin 10.3 (L) 13.0 - 17.0 g/dL   HCT 28.4 (L) 39.0 - 52.0 %   MCV 92.2 80.0 - 100.0 fL   MCH 33.4 26.0 - 34.0 pg   MCHC 36.3 (H) 30.0 - 36.0 g/dL   RDW 21.2 (H) 11.5 - 15.5 %   Platelets 210 150 - 400 K/uL  nRBC 0.0 0.0 - 0.2 %    Comment: Performed at [facility], [place] [address] [place], [place] [address]  Comprehensive metabolic panel     Status: Abnormal   Collection Time: 10/09/21  5:11 AM  Result Value Ref Range   Sodium 132 (L) 135 - 145 mmol/L   Potassium 3.5 3.5 - 5.1 mmol/L   Chloride 99 98 - 111 mmol/L   CO2 26 22 - 32 mmol/L   Glucose, Bld 102 (H) 70 - 99 mg/dL    Comment: Glucose reference range applies only to samples taken after fasting for at least 8 hours.   BUN 8 6 - 20 mg/dL   Creatinine, Ser 0.65 0.61 - 1.24 mg/dL   Calcium 8.7 (L) 8.9 - 10.3 mg/dL   Total Protein 5.7 (L) 6.5 - 8.1 g/dL   Albumin 2.0 (L) 3.5 - 5.0 g/dL   AST 239 (H) 15 - 41 U/L   ALT 342 (H) 0 - 44 U/L   Alkaline Phosphatase 612 (H) 38 - 126 U/L   Total Bilirubin 17.1 (H) 0.3 - 1.2 mg/dL   GFR, Estimated >60 >60 mL/min    Comment: (NOTE) Calculated using the CKD-EPI Creatinine Equation (2021)    Anion gap 7 5 - 15    Comment: Performed at [facility], [place] [address], [place], [place] [address]    No results found.  Review of Systems  Constitutional:  Positive for appetite change and unexpected weight change.  Skin:  Positive for color change.  All other systems reviewed and are negative.  Blood pressure 133/79, pulse 70, temperature 99.4 F (37.4 C), temperature source Oral, resp. rate 16, height 5\' 11"  (1.803 m), weight 80.3 kg, SpO2 98 %. Physical Exam Vitals reviewed.  Constitutional:      General: He is not in acute distress.    Appearance: He is well-developed. He is not ill-appearing.  HENT:     Head:  Normocephalic and atraumatic.     Mouth/Throat:     Mouth: Mucous membranes are moist.  Eyes:     General: Scleral icterus present.     Extraocular Movements: Extraocular movements intact.     Pupils: Pupils are equal, round, and reactive to light.  Cardiovascular:     Rate and Rhythm: Normal rate and regular rhythm.  Pulmonary:     Effort: Pulmonary effort is normal.     Breath sounds: No stridor.  Abdominal:     General: There is no distension or abdominal bruit. There are no signs of injury.     Palpations: Abdomen is soft.     Tenderness: There is no abdominal tenderness. There is no guarding.     Hernia: No hernia is present.  Skin:    General: Skin is warm.     Capillary Refill: Capillary refill takes 2 to 3 seconds.     Coloration: Skin is jaundiced. Skin is not mottled or pale.     Findings: No erythema or rash.  Neurological:     General: No focal deficit present.     Mental Status: He is alert and oriented to person, place, and time.     Cranial Nerves: No cranial nerve deficit.     Motor: Weakness present.  Psychiatric:        Mood and Affect: Mood normal.        Behavior: Behavior normal.     Assessment/Plan: Malignant stricture of the distal common bile duct. ? Mass. Hyperbilirubinemia Severe  protein calorie malnutrition. Post ERCP pancreatitis Anemia- unclear source, ?anemia of chronic disease.  Patient will need to buff up nutrition.  I will plan to see him back in the office in several weeks with a triple phase CT abd/pelvis.  The most likely source of a distal common bile duct stricture is malignancy, though it is also possible that he may have chronic pancreatitis. I feel this is less likely based on the level of the bilirubin and he hasn't had pancreatitis in the past.    To be a candidate for a whipple, he will need to improve his albumin with protein and caloric intake and have pancreatitis resolve.  I have briefly discussed surgery with him.  If we had  a dx of malignancy pre op, I would recommend neoadjuvant chemo, but without definitive cancer dx, would do surgery first.     I discussed the surgery and rationale with the patient, including diagrams of anataomy  He understancds and wished   Stark Klein 10/09/2021, 7:42 AM

## 2021-10-09 NOTE — Progress Notes (Signed)
Pharmacy Antibiotic Note  Dean [name] is a 55 y.o. male admitted on 10/04/2021 with  intra-abdominal infection .  Pharmacy has been consulted for meropenem dosing.  Pt is s/p ERCP with stent placement on 10/31,.   Today, 10/09/21 WBC WNL SCr WNL, stable Tmax 100.20F  Plan: Continue meropenem 1 g IV q8h Significant drug interaction noted between meropenem and divalproex, resulting in decreased concentration of depakote. MD made aware of drug interaction. Pt prescribed depakote for bipolar disorder; no seizure history noted.  Follow renal function for necessary dose adjustments  Height: 5\' 11"  (180.3 cm) Weight: 80.3 kg (177 lb) IBW/kg (Calculated) : 75.3  Temp (24hrs), Avg:99 F (37.2 C), Min:98.4 F (36.9 C), Max:99.4 F (37.4 C)  Recent Labs  Lab 10/04/21 2229 10/04/21 2301 10/05/21 0205 10/05/21 0525 10/06/21 0436 10/07/21 0424 10/08/21 0421 10/09/21 0511  WBC 6.5  --   --  7.7  --  15.1* 13.4* 11.9*  CREATININE 0.66  --   --  0.65 0.62 0.50* 0.57* 0.65  LATICACIDVEN  --  1.5 1.1  --   --   --   --   --      Estimated Creatinine Clearance: 111.1 mL/min (by C-G formula based on SCr of 0.65 mg/dL).    Allergies  Allergen Reactions   Erythromycin Nausea Only   Ibuprofen Other (See Comments)    Peptic Ulcer nsaids    Antimicrobials this admission: Pip/tazo 11/1 >> 11/2 meropenem 11/2 >>   Dose adjustments this admission:  Microbiology results: 10/31 BCx: ngtd  Thank you for allowing pharmacy to be a part of this patient's care.  Royetta Asal, PharmD, BCPS 10/09/2021 10:34 AM

## 2021-10-10 DIAGNOSIS — K859 Acute pancreatitis without necrosis or infection, unspecified: Secondary | ICD-10-CM | POA: Diagnosis not present

## 2021-10-10 LAB — COMPREHENSIVE METABOLIC PANEL
ALT: 225 U/L — ABNORMAL HIGH (ref 0–44)
AST: 101 U/L — ABNORMAL HIGH (ref 15–41)
Albumin: 2 g/dL — ABNORMAL LOW (ref 3.5–5.0)
Alkaline Phosphatase: 501 U/L — ABNORMAL HIGH (ref 38–126)
Anion gap: 3 — ABNORMAL LOW (ref 5–15)
BUN: 11 mg/dL (ref 6–20)
CO2: 26 mmol/L (ref 22–32)
Calcium: 8.4 mg/dL — ABNORMAL LOW (ref 8.9–10.3)
Chloride: 104 mmol/L (ref 98–111)
Creatinine, Ser: 0.64 mg/dL (ref 0.61–1.24)
GFR, Estimated: >60 mL/min (ref >60)
Glucose, Bld: 112 mg/dL — ABNORMAL HIGH (ref 70–99)
Potassium: 3.8 mmol/L (ref 3.5–5.1)
Sodium: 133 mmol/L — ABNORMAL LOW (ref 135–145)
Total Bilirubin: 10.3 mg/dL — ABNORMAL HIGH (ref 0.3–1.2)
Total Protein: 5.2 g/dL — ABNORMAL LOW (ref 6.5–8.1)

## 2021-10-10 LAB — CULTURE, BLOOD (ROUTINE X 2)
Culture: NO GROWTH
Special Requests: ADEQUATE

## 2021-10-10 NOTE — Progress Notes (Signed)
PROGRESS NOTE    Dean [name]  TKW:[id number] DOB: 1966/02/13 DOA: 10/04/2021 PCP: Maude Leriche, PA-C    Brief Narrative:  55 y.o. male with medical history significant for bipolar disorder, hypothyroidism, abnormal MRI/MRCP concerning for biliary obstruction post ERCP on 10/04/2021 who presented to [facility] ED due to severe lower abdominal pain post ERCP, found to have acute pancreatitis  Assessment & Plan:   Principal Problem:   Acute pancreatitis Active Problems:   Bipolar disorder ([place])   Hypothyroidism  Acute pancreatitis post ERCP Developed severe abdominal pain post ERCP on 10/04/2021 Acute pancreatitis without necrosis seen on CT scan Presented with lipase level 4625, steadily trending down to 79 on 11/3 Tolerating low fat diet  Acute transaminitis with jaundice LFT's initially trended up, steadily trending down Avoid hepatotoxic agents Repeat LFT's in AM Bilirubin peaked at 29, now down to 10 today GI to re-eval tomorrow   Dilated common bile duct and abnormal MRI/MRCP concerning for biliary obstruction post ERCP on 10/04/2021 with stents placement Per ERCP report:  - The major papilla appeared normal. - One pancreatic stent was placed into the ventral pancreatic duct. - A short precut biliary sphincterotomy was performed. - One stent was removed from Duodenum. - One pancreatic stent was placed into the ventral pancreatic duct. - A biliary sphincterotomy was performed. - Cells for cytology obtained in the lower third of the main duct. - One plastic stent was placed into the common bile duct. Bilirubin now trending down per above CA19-9 elevated at 85 Per GI, may also require repeat EUS for definitive diagnosis and eventually repeat ERCP for metal stent placement Seen by General Surgery, who was consulted by GI regarding surgical management for possible whipple moving forward   Hyperglycemia Random glucose stable this AM A1c of 5.1 noted   Mild anion  gap metabolic acidosis Presented with serum bicarb 21 and anion gap of 13 Improved with IVF hydration   Bipolar disorder Continued on home Depakote Reports of ?passing tabs in stool. Pt did report loose stools earlier when LFT's were markedly elevated LFT's improving and pt reporting stool is improving   Hypothyroidism Continued home levothyroxine as tolerated    DVT prophylaxis: SCD's Code Status: Full Family Communication: Pt in room, family currently not at bedside  Status is: Inpatient  Remains inpatient appropriate because: Acuity of illness    Consultants:  GI General Surgery  Procedures:    Antimicrobials: Anti-infectives (From admission, onward)    Start     Dose/Rate Route Frequency Ordered Stop   10/06/21 2000  meropenem (MERREM) 1 g in sodium chloride 0.9 % 100 mL IVPB        1 g 200 mL/hr over 30 Minutes Intravenous Every 8 hours 10/06/21 1742     10/05/21 2230  piperacillin-tazobactam (ZOSYN) IVPB 3.375 g  Status:  Discontinued        3.375 g 12.5 mL/hr over 240 Minutes Intravenous Every 8 hours 10/05/21 2143 10/06/21 1621   10/05/21 0030  piperacillin-tazobactam (ZOSYN) IVPB 3.375 g        3.375 g 100 mL/hr over 30 Minutes Intravenous  Once 10/05/21 0017 10/05/21 0129       Subjective: Reports feeling better. Reports depakote tabs seen in stools recently  Objective: Vitals:   10/09/21 0457 10/09/21 1320 10/09/21 2014 10/10/21 0558  BP: 133/79 132/80 (!) 148/78 132/81  Pulse: 70 73 74 63  Resp: 16 16 20 16   Temp: 99.4 F (37.4 C) 98.6 F (37 C) 99.7 F (  37.6 C) 99.3 F (37.4 C)  TempSrc: Oral Oral Oral Oral  SpO2: 98% 97% 99% 97%  Weight:      Height:        Intake/Output Summary (Last 24 hours) at 10/10/2021 1431 Last data filed at 10/10/2021 1000 Gross per 24 hour  Intake 3048.38 ml  Output --  Net 3048.38 ml    Filed Weights   10/04/21 2303  Weight: 80.3 kg    Examination: General exam: Awake, laying in bed, in  nad Respiratory system: Normal respiratory effort, no wheezing Cardiovascular system: regular rate, s1, s2 Gastrointestinal system: Soft, nondistended, positive BS Central nervous system: CN2-12 grossly intact, strength intact Extremities: Perfused, no clubbing Skin: Normal skin turgor, no notable skin lesions seen, jaundice improving Psychiatry: Mood normal // no visual hallucinations   Data Reviewed: I have personally reviewed following labs and imaging studies  CBC: Recent Labs  Lab 10/04/21 2229 10/05/21 0525 10/07/21 0424 10/08/21 0421 10/09/21 0511  WBC 6.5 7.7 15.1* 13.4* 11.9*  NEUTROABS 4.4  --   --   --   --   HGB 13.9 12.1* 11.8* 10.8* 10.3*  HCT 39.0 33.1* 33.0* 29.7* 28.4*  MCV 92.2 94.3 94.3 92.2 92.2  PLT 228 195 173 172 124    Basic Metabolic Panel: Recent Labs  Lab 10/05/21 0525 10/06/21 0436 10/07/21 0424 10/08/21 0421 10/09/21 0511 10/10/21 0517  NA 133* 131* 132* 131* 132* 133*  K 3.7 3.9 4.4 3.3* 3.5 3.8  CL 102 99 101 100 99 104  CO2 24 26 24 26 26 26   GLUCOSE 128* 95 101* 102* 102* 112*  BUN 13 8 7 7 8 11   CREATININE 0.65 0.62 0.50* 0.57* 0.65 0.64  CALCIUM 8.4* 9.0 8.8* 8.6* 8.7* 8.4*  MG 2.0  --   --   --   --   --   PHOS 3.8  --   --   --   --   --     GFR: Estimated Creatinine Clearance: 111.1 mL/min (by C-G formula based on SCr of 0.64 mg/dL). Liver Function Tests: Recent Labs  Lab 10/06/21 0436 10/07/21 0424 10/08/21 0421 10/09/21 0511 10/10/21 0517  AST 727* 484* 308* 239* 101*  ALT 642* 542* 406* 342* 225*  ALKPHOS 790* 645* 638* 612* 501*  BILITOT 23.3* 21.9* 20.1* 17.1* 10.3*  PROT 5.7* 5.4* 5.0* 5.7* 5.2*  ALBUMIN 2.5* 2.1* 2.2* 2.0* 2.0*    Recent Labs  Lab 10/04/21 2229 10/05/21 0525 10/06/21 0436 10/07/21 0424  LIPASE 4,625* 1,638* 689* 79*    No results for input(s): AMMONIA in the last 168 hours. Coagulation Profile: No results for input(s): INR, PROTIME in the last 168 hours. Cardiac Enzymes: No  results for input(s): CKTOTAL, CKMB, CKMBINDEX, TROPONINI in the last 168 hours. BNP (last 3 results) No results for input(s): PROBNP in the last 8760 hours. HbA1C: No results for input(s): HGBA1C in the last 72 hours.  CBG: No results for input(s): GLUCAP in the last 168 hours. Lipid Profile: No results for input(s): CHOL, HDL, LDLCALC, TRIG, CHOLHDL, LDLDIRECT in the last 72 hours.  Thyroid Function Tests: No results for input(s): TSH, T4TOTAL, FREET4, T3FREE, THYROIDAB in the last 72 hours. Anemia Panel: No results for input(s): VITAMINB12, FOLATE, FERRITIN, TIBC, IRON, RETICCTPCT in the last 72 hours. Sepsis Labs: Recent Labs  Lab 10/04/21 2301 10/05/21 0205  LATICACIDVEN 1.5 1.1     Recent Results (from the past 240 hour(s))  Blood culture (routine x 2)  Status: None   Collection Time: 10/04/21 11:01 PM   Specimen: BLOOD  Result Value Ref Range Status   Specimen Description   Final    BLOOD LEFT ANTECUBITAL Performed at [place] [address] [place], [place] [address]    Special Requests   Final    BOTTLES DRAWN AEROBIC AND ANAEROBIC Blood Culture adequate volume Performed at [place] [address], [place], [place] [address]    Culture   Final    NO GROWTH 5 DAYS Performed at [facility], [place] [address] [place], [place] [address]    Report Status 10/10/2021 FINAL  Final  Blood culture (routine x 2)     Status: None   Collection Time: 10/04/21 11:06 PM   Specimen: BLOOD  Result Value Ref Range Status   Specimen Description   Final    BLOOD RIGHT ANTECUBITAL Performed at [place] [address], [place], [place] [address]    Special Requests   Final    BOTTLES DRAWN AEROBIC AND ANAEROBIC Blood Culture adequate volume Performed at [place] [address], [place], [place] [address]    Culture   Final    NO GROWTH 5 DAYS Performed at [facility], [place] [address] [place], [place] [address]    Report Status 10/10/2021 FINAL  Final  Resp Panel by RT-PCR (Flu A&B, Covid) Nasopharyngeal Swab     Status: None   Collection Time: 10/05/21  3:56 AM   Specimen: Nasopharyngeal Swab; Nasopharyngeal(NP) swabs in vial transport medium  Result Value Ref Range Status   SARS Coronavirus 2 by RT PCR NEGATIVE NEGATIVE Final    Comment: (NOTE) SARS-CoV-2 target nucleic acids are NOT DETECTED.  The SARS-CoV-2 RNA is generally detectable in upper respiratory specimens during the acute phase of infection. The lowest concentration of SARS-CoV-2 viral copies this assay can detect is 138 copies/mL. A negative result does not preclude SARS-Cov-2 infection and should not be used as the sole basis for treatment or other patient management decisions. A negative result may occur with  improper specimen collection/handling, submission of specimen other than nasopharyngeal swab, presence of viral mutation(s) within the areas targeted by this assay, and inadequate number of viral copies(<138 copies/mL). A negative result must be combined with clinical observations, patient history, and epidemiological information. The expected result is Negative.  Fact Sheet for Patients:  EntrepreneurPulse.com.au  Fact Sheet for Healthcare Providers:  IncredibleEmployment.be  This test is no t yet approved or cleared by the [place] FDA and  has been authorized for detection and/or diagnosis of SARS-CoV-2 by FDA under an Emergency Use Authorization (EUA). This EUA will remain  in effect (meaning this test can be used) for the duration of the COVID-19 declaration under Section 564(b)(1) of the Act, 21 U.S.C.section 360bbb-3(b)(1), unless the authorization is terminated  or revoked sooner.       Influenza A by PCR NEGATIVE NEGATIVE Final   Influenza B by PCR NEGATIVE NEGATIVE Final    Comment: (NOTE) The Xpert Xpress  SARS-CoV-2/FLU/RSV plus assay is intended as an aid in the diagnosis of influenza from Nasopharyngeal swab specimens and should not be used as a sole basis for treatment. Nasal washings and aspirates are unacceptable for Xpert Xpress SARS-CoV-2/FLU/RSV testing.  Fact Sheet for Patients: EntrepreneurPulse.com.au  Fact Sheet for Healthcare Providers: IncredibleEmployment.be  This test is not yet approved or cleared by the [place] and has been authorized  for detection and/or diagnosis of SARS-CoV-2 by FDA under an Emergency Use Authorization (EUA). This EUA will remain in effect (meaning this test can be used) for the duration of the COVID-19 declaration under Section 564(b)(1) of the Act, 21 U.S.C. section 360bbb-3(b)(1), unless the authorization is terminated or revoked.  Performed at [facility], [place] [address], [place], [place] [address]       Radiology Studies: No results found.  Scheduled Meds:  divalproex  2,000 mg Oral QHS   enoxaparin (LOVENOX) injection  40 mg Subcutaneous Daily   levothyroxine  137 mcg Oral QAC breakfast   pantoprazole  40 mg Oral Daily   polyethylene glycol  17 g Oral BID   Continuous Infusions:  sodium chloride 125 mL/hr at 10/10/21 1220   meropenem (MERREM) IV 1 g (10/10/21 1342)     LOS: 5 days   Marylu Lund, MD [facility] Pager On Amion  If 7PM-7AM, please contact night-coverage 10/10/2021, 2:31 PM

## 2021-10-11 DIAGNOSIS — K859 Acute pancreatitis without necrosis or infection, unspecified: Secondary | ICD-10-CM | POA: Diagnosis not present

## 2021-10-11 LAB — COMPREHENSIVE METABOLIC PANEL
ALT: 195 U/L — ABNORMAL HIGH (ref 0–44)
AST: 120 U/L — ABNORMAL HIGH (ref 15–41)
Albumin: 2 g/dL — ABNORMAL LOW (ref 3.5–5.0)
Alkaline Phosphatase: 500 U/L — ABNORMAL HIGH (ref 38–126)
Anion gap: 5 (ref 5–15)
BUN: 12 mg/dL (ref 6–20)
CO2: 24 mmol/L (ref 22–32)
Calcium: 8.5 mg/dL — ABNORMAL LOW (ref 8.9–10.3)
Chloride: 105 mmol/L (ref 98–111)
Creatinine, Ser: 0.73 mg/dL (ref 0.61–1.24)
GFR, Estimated: >60 mL/min (ref >60)
Glucose, Bld: 116 mg/dL — ABNORMAL HIGH (ref 70–99)
Potassium: 3.9 mmol/L (ref 3.5–5.1)
Sodium: 134 mmol/L — ABNORMAL LOW (ref 135–145)
Total Bilirubin: 8.7 mg/dL — ABNORMAL HIGH (ref 0.3–1.2)
Total Protein: 5.1 g/dL — ABNORMAL LOW (ref 6.5–8.1)

## 2021-10-11 NOTE — Progress Notes (Signed)
[facility] Progress Note  Broc Caspers Gotto 55 y.o. [birth year]  CC:   Pancreatitis s/p ERCP  Subjective: Patient seen and examined at bedside.   Patient feeling very well today. Denies any fevers or chills. Has had 2 bowel movements today, loose, no blood in the stool. Minimal abdominal pain, improving significantly, trying to avoid narcotics for pain, and even then getting to 4 out of 10. Patient's been on soft low-fat diet, tolerating well, denies nausea and vomiting. Patient has been walking the halls without shortness of breath or chest pain.  ROS : + 2 loose stools, improving AB pain. Negative fever, chills, nausea, vomiting.    Objective: Vital signs in last 24 hours: Vitals:   10/10/21 2235 10/11/21 0603  BP: 140/81 132/84  Pulse: 68 (!) 59  Resp: 20 20  Temp: 98.7 F (37.1 C) 98.5 F (36.9 C)  SpO2: 98% 99%    Physical Exam: General:   Alert, with jaundice, in NAD Heart:  Regular rate and rhythm; no murmurs Pulm: CTAB; no rales Abdomen:  Distended, AB, normal BS, mild tenderness in the epigastrium and in the lower abdomen. Without guarding and Without rebound,  Extremities:  Without edema Neurologic:  Alert and  oriented x4;  grossly normal neurologically. Psych:  Alert and cooperative. Normal mood and affect.  Lab Results: Recent Labs    10/10/21 0517 10/11/21 0417  NA 133* 134*  K 3.8 3.9  CL 104 105  CO2 26 24  GLUCOSE 112* 116*  BUN 11 12  CREATININE 0.64 0.73  CALCIUM 8.4* 8.5*   Recent Labs    10/10/21 0517 10/11/21 0417  AST 101* 120*  ALT 225* 195*  ALKPHOS 501* 500*  BILITOT 10.3* 8.7*  PROT 5.2* 5.1*  ALBUMIN 2.0* 2.0*   Recent Labs    10/09/21 0511  WBC 11.9*  HGB 10.3*  HCT 28.4*  MCV 92.2  PLT 210   No results for input(s): LABPROT, INR in the last 72 hours.    Assessment/Plan: Labs reviewed.  FNA from EUS showed atypical cell concerning for malignancy .   Cytology from brushings negative for  malignancy.   Assessment ------------------ -Obstructive jaundice with concerning for pancreatic head cancer.   S/p EUS and ERCP on October 04, 2021.   PD as well as CBD stent placement during ERCP presented with worsening abdominal pain and was diagnosed with post ERCP pancreatitis.    -T bili is slowly improving-now at 8.7 with LFTS also trending down.  -Mild to moderate protein malnutrition albumin at 2  - CA19-9 elevated at 85   Recommendations ------------------------- -Continue supportive care, pain medications as needed, soft diet, low-fat diet. -Continue ambulation. -Constipation improved, MiraLAX once daily if continuing pain medication needed, adjust dose as needed. General surgery has seen patient 10/09/2021 Dr. Barry Dienes, patient arrangements have been made for outpatient follow-up in 2 weeks with repeat CT. -Patient has stent placement, at this time no need for repeat EUS or ERCP due to likely surgical management outpatient. -Discussion with patient, he prefers plan for surgical management. -Need to improve albumin with protein and caloric intake. -At this time patient appears stable enough for outpatient management, can follow-up with GI as needed.   Vladimir Crofts PA-C 10/11/2021, 10:24 AM  Contact #  [phone]

## 2021-10-11 NOTE — Discharge Summary (Signed)
Physician Discharge Summary  Dean [name] FAO:[id number] DOB: [birth year] DOA: 10/04/2021  PCP: Maude Leriche, PA-C  Admit date: 10/04/2021 Discharge date: 10/11/2021  Admitted From: Home Disposition:  Home  Recommendations for Outpatient Follow-up:  Follow up with PCP in 1-2 weeks F/u as GI as scheduled   Discharge Condition:Improved CODE STATUS:Full Diet recommendation: Regular   Brief/Interim Summary: 55 y.o. male with medical history significant for bipolar disorder, hypothyroidism, abnormal MRI/MRCP concerning for biliary obstruction post ERCP on 10/04/2021 who presented to [facility] ED due to severe lower abdominal pain post ERCP, found to have acute pancreatitis   Discharge Diagnoses:  Principal Problem:   Acute pancreatitis Active Problems:   Bipolar disorder ([place])   Hypothyroidism   Acute pancreatitis post ERCP Developed severe abdominal pain post ERCP on 10/04/2021 Acute pancreatitis without necrosis seen on CT scan Presented with lipase level 4625, steadily trending down to 79 on 11/3 Tolerating low fat diet   Acute transaminitis with jaundice LFT's initially trended up, steadily trending down Avoid hepatotoxic agents Bilirubin peaked at 29, now less than 10 OK to d/c per GI   Dilated common bile duct and abnormal MRI/MRCP concerning for biliary obstruction post ERCP on 10/04/2021 with stents placement Per ERCP report:  - The major papilla appeared normal. - One pancreatic stent was placed into the ventral pancreatic duct. - A short precut biliary sphincterotomy was performed. - One stent was removed from Duodenum. - One pancreatic stent was placed into the ventral pancreatic duct. - A biliary sphincterotomy was performed. - Cells for cytology obtained in the lower third of the main duct. - One plastic stent was placed into the common bile duct. Bilirubin now trending down per above CA19-9 elevated at 85 Per GI, may also require repeat EUS for  definitive diagnosis and eventually repeat ERCP for metal stent placement Seen by General Surgery, who was consulted by GI regarding surgical management for possible whipple moving forward. Pt to f/u with General Surgery as outpt after d/c   Hyperglycemia Random glucose stable this AM A1c of 5.1 noted   Mild anion gap metabolic acidosis Presented with serum bicarb 21 and anion gap of 13 Improved with IVF hydration   Bipolar disorder Continued on home Depakote Reports of ?passing tabs in stool. Pt did report loose stools earlier when LFT's were markedly elevated, now improving   Hypothyroidism Continued home levothyroxine as tolerated   Discharge Instructions   Allergies as of 10/11/2021       Reactions   Erythromycin Nausea Only   Ibuprofen Other (See Comments)   Peptic Ulcer nsaids        Medication List     TAKE these medications    ALPRAZolam 0.5 MG tablet Commonly known as: XANAX Take 1 tablet (0.5 mg total) by mouth at bedtime as needed for anxiety.   divalproex 500 MG 24 hr tablet Commonly known as: DEPAKOTE ER TAKE 5 TABLETS (2,500 MG TOTAL) BY MOUTH DAILY. What changed:  how much to take when to take this   levothyroxine 137 MCG tablet Commonly known as: SYNTHROID Take 137 mcg by mouth daily before breakfast.   One Daily Mens 50+ Multivit Tabs Take 1 tablet by mouth daily.   pantoprazole 40 MG tablet Commonly known as: PROTONIX Take 40 mg by mouth daily.        Follow-up Information     Scifres, Earlie Server, PA-C Follow up in 2 week(s).   Specialty: Physician Assistant Why: Hospital follow up Contact information: [place]  [place] [id number] [phone]         Gastroenterology, [facility] [place]. Call.   Why: As needed Contact information: [address] [place] [address] [phone]                Allergies  Allergen Reactions   Erythromycin Nausea Only   Ibuprofen Other (See Comments)    Peptic  Ulcer nsaids    Consultations: GI General Surgery  Procedures/Studies: US Abdomen Complete  Result Date: 09/16/2021 CLINICAL DATA:  Elevated liver transaminase level EXAM: ABDOMEN ULTRASOUND COMPLETE COMPARISON:  10/01/2010 FINDINGS: Gallbladder: Mid level echoes without shadowing seen throughout the gallbladder. No wall thickening or focal tenderness. Common bile duct: Diameter: 13 mm. Liver: Borderline elevated echogenicity in the liver but no diminished acoustic penetration. Portal vein is patent on color Doppler imaging with normal direction of blood flow towards the liver. Intrahepatic bile duct dilatation. IVC: No abnormality visualized. Pancreas: Visualized portion unremarkable. Spleen: Size and appearance within normal limits. Right Kidney: Length: 11 cm. Echogenicity within normal limits. No mass or hydronephrosis visualized. Left Kidney: Length: 12 cm. Echogenicity within normal limits. No mass or hydronephrosis visualized. Abdominal aorta: No aneurysm visualized. IMPRESSION: 1. Extensive gallbladder sludge and possible admixed small calculi. No signs of cholecystitis. 2. Intra and extrahepatic bile duct dilatation without visualized cause, suggest MRCP. Electronically Signed   By: Jorje Guild M.D.   On: 09/16/2021 09:59   CT ABDOMEN PELVIS W CONTRAST  Result Date: 10/05/2021 CLINICAL DATA:  Abdominal pain, acute, nonlocalized post ERCP with stent today. Jaundice EXAM: CT ABDOMEN AND PELVIS WITH CONTRAST TECHNIQUE: Multidetector CT imaging of the abdomen and pelvis was performed using the standard protocol following bolus administration of intravenous contrast. CONTRAST:  66mL OMNIPAQUE IOHEXOL 350 MG/ML SOLN COMPARISON:  None. FINDINGS: Lower chest: Visualized lung bases are clear. The visualized heart and pericardium are unremarkable. Hepatobiliary: Pneumobilia is present in keeping with reported history of sphincterotomy and internal biliary stenting. Silastic stent is seen within  the distal common duct extending into the second portion of the duodenum. No intra or extrahepatic biliary ductal dilation. Mild pericholecystic inflammatory stranding is present which is nonspecific and may relate to the adjacent inflammatory process involving the pancreas or may be postprocedural in nature. No enhancing intrahepatic mass identified. Pancreas: There is moderate peripancreatic acute inflammatory fluid in keeping with changes of acute interstitial/edematous pancreatitis. Normal enhancement of the pancreatic parenchyma. No pancreatic necrosis identified. Stent is seen within the central pancreatic duct extending into the second portion of the duodenum. Pancreatic duct is not dilated. No peripancreatic necrotic collections are identified. Spleen: Unremarkable Adrenals/Urinary Tract: Adrenal glands are unremarkable. Kidneys are normal, without renal calculi, focal lesion, or hydronephrosis. Bladder is unremarkable. Stomach/Bowel: Small free fluid within the pelvis. Stomach, small bowel, and large bowel are unremarkable. Appendix normal. No free intraperitoneal gas. Vascular/Lymphatic: Aortic atherosclerosis. No enlarged abdominal or pelvic lymph nodes. Reproductive: Prostate is unremarkable. Other: No abdominal wall hernia.  Rectum unremarkable. Musculoskeletal: No acute bone abnormality. No lytic or blastic bone lesion. Degenerative changes are seen within the lumbosacral junction. IMPRESSION: Status post internal biliary and pancreatic stenting. Pneumobilia in keeping with sphincterotomy and violation of the sphincter of Oddi. Moderate peripancreatic acute inflammatory fluid in keeping with acute interstitial/edematous pancreatitis. No pancreatic or peripancreatic necrosis identified. The pancreatic duct is not dilated. Mild pericholecystic inflammatory stranding, nonspecific and possibly related to the adjacent inflammatory process within the pancreas or recent biliary intervention. Mild ascites. No  free intraperitoneal  gas or retroperitoneal gas identified. Aortic Atherosclerosis (ICD10-I70.0). Electronically Signed   By: Fidela Salisbury M.D.   On: 10/05/2021 03:40   DG Chest Portable 1 View  Result Date: 10/04/2021 CLINICAL DATA:  Abdominal pain after ERCP this morning EXAM: PORTABLE CHEST 1 VIEW COMPARISON:  Chest radiograph 08/19/2015 FINDINGS: The cardiomediastinal contours are normal. The lungs are clear. Pulmonary vasculature is normal. No consolidation, pleural effusion, or pneumothorax. No acute osseous abnormalities are seen. No visible free air under the hemidiaphragm. IMPRESSION: Negative radiograph of the chest. Electronically Signed   By: Keith Rake M.D.   On: 10/04/2021 23:29   DG ERCP  Result Date: 10/04/2021 CLINICAL DATA:  55 year old male with suspected malignant obstructed jaundice. EXAM: ERCP TECHNIQUE: Multiple spot images obtained with the fluoroscopic device and submitted for interpretation post-procedure. FLUOROSCOPY TIME:  Fluoroscopy Time:  4 minutes 36 seconds Number of Acquired Spot Images: 0 COMPARISON:  MRCP 09/27/2021 FINDINGS: A total of 3 intraoperative spot images are submitted for review. The images demonstrate a flexible duodenal scope in the descending duodenum. A plastic stent is placed in the pancreatic duct followed by cannulation of the common bile duct. Cholangiogram demonstrates marked intra and extrahepatic biliary ductal dilatation. IMPRESSION: 1. Placement of plastic pancreatic stent. 2. Cholangiogram demonstrates marked intra and extrahepatic biliary ductal dilatation. These images were submitted for radiologic interpretation only. Please see the procedural report for the amount of contrast and the fluoroscopy time utilized. Electronically Signed   By: Jacqulynn Cadet M.D.   On: 10/04/2021 13:43   DG Abd Portable 2 Views  Result Date: 10/04/2021 CLINICAL DATA:  Abdominal pain following ERCP. EXAM: PORTABLE ABDOMEN - 2 VIEW COMPARISON:  None  FINDINGS: There is mild gaseous distension of multiple loops of small bowel suggesting a mild postprocedural ileus. Varicoid lucencies within the right paraspinal region are in keeping with pneumobilia likely related to reported ERCP procedure. Internal biliary and pancreatic stents are in expected position. There is lucency surrounding the right kidney as well as streaky lucencies inferior to the right kidney, likely representing retroperitoneal gas within the right pararenal space. No free intraperitoneal gas is clearly identified. IMPRESSION: Suspected right retroperitoneal gas. This could be confirmed with CT imaging. Pneumobilia in keeping with ERCP examination. Internal biliary and pancreatic stents overlie the expected position. Electronically Signed   By: Fidela Salisbury M.D.   On: 10/04/2021 23:35   MR ABDOMEN MRCP W WO CONTAST  Result Date: 09/29/2021 CLINICAL DATA:  Elevated LFTs. EXAM: MRI ABDOMEN WITHOUT AND WITH CONTRAST (INCLUDING MRCP) TECHNIQUE: Multiplanar multisequence MR imaging of the abdomen was performed both before and after the administration of intravenous contrast. Heavily T2-weighted images of the biliary and pancreatic ducts were obtained, and three-dimensional MRCP images were rendered by post processing. CONTRAST:  55mL MULTIHANCE GADOBENATE DIMEGLUMINE 529 MG/ML IV SOLN COMPARISON:  None. FINDINGS: Lower chest: No acute findings. Hepatobiliary: No focal enhancing liver abnormality. Gallbladder sludge. No gallstones identified. No signs of gallbladder wall thickening or inflammation. There is marked diffuse intrahepatic bile duct dilatation. The common bile duct is dilated measuring 1.8 cm in abruptly terminates at the level of the head of pancreas, image 23/4. No signs of choledocholithiasis. Pancreas: No pancreatic inflammation or main duct dilatation. Indistinct, vague area of relative hypoenhancement within the head of pancreas at the abrupt termination of the common bile duct  is noted., image 59/23. Furthermore, there is loss of a normal fat plane between the posterior wall of the gastric antrum and anterior head a of  pancreas, image 58/18. Spleen:  Within normal limits in size and appearance. Adrenals/Urinary Tract: No masses identified. No evidence of hydronephrosis. Stomach/Bowel: Visualized portions within the abdomen are unremarkable. Vascular/Lymphatic: Aortic atherosclerosis without aneurysm. No abdominal adenopathy. Other:  None. Musculoskeletal: No suspicious bone lesions identified. IMPRESSION: 1. Marked diffuse intrahepatic and common bile duct dilatation. There is abrupt termination of the common bile duct at the level of the head of pancreas. Indistinct, vague area of relative hypoenhancement within the head of pancreas at the level of the head of pancreas is noted. The diagnosis of exclusion is pancreatic neoplasm. Further evaluation with ERCP/EUS is advised. 2. Gallbladder sludge. No gallstones or gallbladder wall thickening. No signs of choledocholithiasis. Electronically Signed   By: Kerby Moors M.D.   On: 09/29/2021 11:31    Subjective: Eager to go home  Discharge Exam: Vitals:   10/11/21 0603 10/11/21 1153  BP: 132/84 133/85  Pulse: (!) 59 65  Resp: 20 16  Temp: 98.5 F (36.9 C) 98.2 F (36.8 C)  SpO2: 99% 100%   Vitals:   10/10/21 1601 10/10/21 2235 10/11/21 0603 10/11/21 1153  BP: 133/82 140/81 132/84 133/85  Pulse: 66 68 (!) 59 65  Resp: 20 20 20 16   Temp: 98.4 F (36.9 C) 98.7 F (37.1 C) 98.5 F (36.9 C) 98.2 F (36.8 C)  TempSrc:  Oral Oral   SpO2: 100% 98% 99% 100%  Weight:      Height:        General: Pt is alert, awake, not in acute distress Cardiovascular: RRR, S1/S2 + Respiratory: CTA bilaterally, no wheezing, no rhonchi Abdominal: Soft, NT, ND, bowel sounds + Extremities: no edema, no cyanosis   The results of significant diagnostics from this hospitalization (including imaging, microbiology, ancillary and  laboratory) are listed below for reference.     Microbiology: Recent Results (from the past 240 hour(s))  Blood culture (routine x 2)     Status: None   Collection Time: 10/04/21 11:01 PM   Specimen: BLOOD  Result Value Ref Range Status   Specimen Description   Final    BLOOD LEFT ANTECUBITAL Performed at [facility] [address] [place], [place] [address]    Special Requests   Final    BOTTLES DRAWN AEROBIC AND ANAEROBIC Blood Culture adequate volume Performed at [facility] [address], [place], [place] [address]    Culture   Final    NO GROWTH 5 DAYS Performed at [facility], [place] [address] [place], [place] [address]    Report Status 10/10/2021 FINAL  Final  Blood culture (routine x 2)     Status: None   Collection Time: 10/04/21 11:06 PM   Specimen: BLOOD  Result Value Ref Range Status   Specimen Description   Final    BLOOD RIGHT ANTECUBITAL Performed at [facility] [address] [place], [place] [address]    Special Requests   Final    BOTTLES DRAWN AEROBIC AND ANAEROBIC Blood Culture adequate volume Performed at [facility] [address], [place], [place] [address]    Culture   Final    NO GROWTH 5 DAYS Performed at [facility], [place] [address] [place], [place] [address]    Report Status 10/10/2021 FINAL  Final  Resp Panel by RT-PCR (Flu A&B, Covid) Nasopharyngeal Swab     Status: None   Collection Time: 10/05/21  3:56 AM   Specimen: Nasopharyngeal Swab; Nasopharyngeal(NP)  swabs in vial transport medium  Result Value Ref Range Status   SARS Coronavirus 2 by RT PCR NEGATIVE NEGATIVE Final    Comment: (NOTE) SARS-CoV-2 target nucleic acids are NOT DETECTED.  The SARS-CoV-2 RNA is generally detectable in upper respiratory specimens during the acute phase of infection. The lowest concentration of SARS-CoV-2 viral copies this assay can detect is 138  copies/mL. A negative result does not preclude SARS-Cov-2 infection and should not be used as the sole basis for treatment or other patient management decisions. A negative result may occur with  improper specimen collection/handling, submission of specimen other than nasopharyngeal swab, presence of viral mutation(s) within the areas targeted by this assay, and inadequate number of viral copies(<138 copies/mL). A negative result must be combined with clinical observations, patient history, and epidemiological information. The expected result is Negative.  Fact Sheet for Patients:  EntrepreneurPulse.com.au  Fact Sheet for Healthcare Providers:  IncredibleEmployment.be  This test is no t yet approved or cleared by the [place] FDA and  has been authorized for detection and/or diagnosis of SARS-CoV-2 by FDA under an Emergency Use Authorization (EUA). This EUA will remain  in effect (meaning this test can be used) for the duration of the COVID-19 declaration under Section 564(b)(1) of the Act, 21 U.S.C.section 360bbb-3(b)(1), unless the authorization is terminated  or revoked sooner.       Influenza A by PCR NEGATIVE NEGATIVE Final   Influenza B by PCR NEGATIVE NEGATIVE Final    Comment: (NOTE) The Xpert Xpress SARS-CoV-2/FLU/RSV plus assay is intended as an aid in the diagnosis of influenza from Nasopharyngeal swab specimens and should not be used as a sole basis for treatment. Nasal washings and aspirates are unacceptable for Xpert Xpress SARS-CoV-2/FLU/RSV testing.  Fact Sheet for Patients: EntrepreneurPulse.com.au  Fact Sheet for Healthcare Providers: IncredibleEmployment.be  This test is not yet approved or cleared by the [place] FDA and has been authorized for detection and/or diagnosis of SARS-CoV-2 by FDA under an Emergency Use Authorization (EUA). This EUA will remain in effect (meaning  this test can be used) for the duration of the COVID-19 declaration under Section 564(b)(1) of the Act, 21 U.S.C. section 360bbb-3(b)(1), unless the authorization is terminated or revoked.  Performed at [facility], [place] [address] [place], [place] [address]      Labs: BNP (last 3 results) No results for input(s): BNP in the last 8760 hours. Basic Metabolic Panel: Recent Labs  Lab 10/05/21 0525 10/06/21 0436 10/07/21 0424 10/08/21 0421 10/09/21 0511 10/10/21 0517 10/11/21 0417  NA 133*   < > 132* 131* 132* 133* 134*  K 3.7   < > 4.4 3.3* 3.5 3.8 3.9  CL 102   < > 101 100 99 104 105  CO2 24   < > 24 26 26 26 24   GLUCOSE 128*   < > 101* 102* 102* 112* 116*  BUN 13   < > 7 7 8 11 12   CREATININE 0.65   < > 0.50* 0.57* 0.65 0.64 0.73  CALCIUM 8.4*   < > 8.8* 8.6* 8.7* 8.4* 8.5*  MG 2.0  --   --   --   --   --   --   PHOS 3.8  --   --   --   --   --   --    < > = values in this interval not displayed.   Liver Function Tests: Recent Labs  Lab 10/07/21 0424 10/08/21 0421 10/09/21  3244 10/10/21 0517 10/11/21 0417  AST 484* 308* 239* 101* 120*  ALT 542* 406* 342* 225* 195*  ALKPHOS 645* 638* 612* 501* 500*  BILITOT 21.9* 20.1* 17.1* 10.3* 8.7*  PROT 5.4* 5.0* 5.7* 5.2* 5.1*  ALBUMIN 2.1* 2.2* 2.0* 2.0* 2.0*   Recent Labs  Lab 10/04/21 2229 10/05/21 0525 10/06/21 0436 10/07/21 0424  LIPASE 4,625* 1,638* 689* 79*   No results for input(s): AMMONIA in the last 168 hours. CBC: Recent Labs  Lab 10/04/21 2229 10/05/21 0525 10/07/21 0424 10/08/21 0421 10/09/21 0511  WBC 6.5 7.7 15.1* 13.4* 11.9*  NEUTROABS 4.4  --   --   --   --   HGB 13.9 12.1* 11.8* 10.8* 10.3*  HCT 39.0 33.1* 33.0* 29.7* 28.4*  MCV 92.2 94.3 94.3 92.2 92.2  PLT 228 195 173 172 210   Cardiac Enzymes: No results for input(s): CKTOTAL, CKMB, CKMBINDEX, TROPONINI in the last 168 hours. BNP: Invalid input(s): POCBNP CBG: No results for input(s): GLUCAP in the last 168  hours. D-Dimer No results for input(s): DDIMER in the last 72 hours. Hgb A1c No results for input(s): HGBA1C in the last 72 hours. Lipid Profile No results for input(s): CHOL, HDL, LDLCALC, TRIG, CHOLHDL, LDLDIRECT in the last 72 hours. Thyroid function studies No results for input(s): TSH, T4TOTAL, T3FREE, THYROIDAB in the last 72 hours.  Invalid input(s): FREET3 Anemia work up No results for input(s): VITAMINB12, FOLATE, FERRITIN, TIBC, IRON, RETICCTPCT in the last 72 hours. Urinalysis    Component Value Date/Time   COLORURINE AMBER (A) 10/05/2021 0231   APPEARANCEUR CLEAR 10/05/2021 0231   LABSPEC 1.026 10/05/2021 0231   PHURINE 5.0 10/05/2021 0231   GLUCOSEU 50 (A) 10/05/2021 0231   HGBUR NEGATIVE 10/05/2021 0231   BILIRUBINUR MODERATE (A) 10/05/2021 0231   KETONESUR 20 (A) 10/05/2021 0231   PROTEINUR 30 (A) 10/05/2021 0231   NITRITE NEGATIVE 10/05/2021 0231   LEUKOCYTESUR NEGATIVE 10/05/2021 0231   Sepsis Labs Invalid input(s): PROCALCITONIN,  WBC,  LACTICIDVEN Microbiology Recent Results (from the past 240 hour(s))  Blood culture (routine x 2)     Status: None   Collection Time: 10/04/21 11:01 PM   Specimen: BLOOD  Result Value Ref Range Status   Specimen Description   Final    BLOOD LEFT ANTECUBITAL Performed at [facility], [place] [address] [place], [place] [address]    Special Requests   Final    BOTTLES DRAWN AEROBIC AND ANAEROBIC Blood Culture adequate volume Performed at [facility] [address] [place], [place] [address]    Culture   Final    NO GROWTH 5 DAYS Performed at [facility], [place] [address], [place], [place] [address]    Report Status 10/10/2021 FINAL  Final  Blood culture (routine x 2)     Status: None   Collection Time: 10/04/21 11:06 PM   Specimen: BLOOD  Result Value Ref Range Status   Specimen Description   Final    BLOOD RIGHT ANTECUBITAL Performed at [facility] [address] [place], [place] [address]    Special Requests   Final    BOTTLES DRAWN AEROBIC AND ANAEROBIC Blood Culture adequate volume Performed at [facility] [address], [place], [place] [address]    Culture   Final    NO GROWTH 5 DAYS Performed at [facility], [place] [address], [place], [place] [address]    Report Status 10/10/2021 FINAL  Final  Resp Panel by RT-PCR (Flu A&B, Covid) Nasopharyngeal Swab     Status: None   Collection Time: 10/05/21  3:56 AM   Specimen: Nasopharyngeal Swab; Nasopharyngeal(NP) swabs in vial transport medium  Result Value Ref Range Status   SARS Coronavirus 2 by RT PCR NEGATIVE NEGATIVE Final    Comment: (NOTE) SARS-CoV-2 target nucleic acids are NOT DETECTED.  The SARS-CoV-2 RNA is generally detectable in upper respiratory specimens during the acute phase of infection. The lowest concentration of SARS-CoV-2 viral copies this assay can detect is 138 copies/mL. A negative result does not preclude SARS-Cov-2 infection and should not be used as the sole basis for treatment or other patient management decisions. A negative result may occur with  improper specimen collection/handling, submission of specimen other than nasopharyngeal swab, presence of viral mutation(s) within the areas targeted by this assay, and inadequate number of viral copies(<138 copies/mL). A negative result must be combined with clinical observations, patient history, and epidemiological information. The expected result is Negative.  Fact Sheet for Patients:  EntrepreneurPulse.com.au  Fact Sheet for Healthcare Providers:  IncredibleEmployment.be  This test is no t yet approved or cleared by the [place] FDA and  has been authorized for detection and/or diagnosis of SARS-CoV-2 by FDA under an Emergency Use Authorization (EUA). This EUA will remain  in effect (meaning this test can be used) for the  duration of the COVID-19 declaration under Section 564(b)(1) of the Act, 21 U.S.C.section 360bbb-3(b)(1), unless the authorization is terminated  or revoked sooner.       Influenza A by PCR NEGATIVE NEGATIVE Final   Influenza B by PCR NEGATIVE NEGATIVE Final    Comment: (NOTE) The Xpert Xpress SARS-CoV-2/FLU/RSV plus assay is intended as an aid in the diagnosis of influenza from Nasopharyngeal swab specimens and should not be used as a sole basis for treatment. Nasal washings and aspirates are unacceptable for Xpert Xpress SARS-CoV-2/FLU/RSV testing.  Fact Sheet for Patients: EntrepreneurPulse.com.au  Fact Sheet for Healthcare Providers: IncredibleEmployment.be  This test is not yet approved or cleared by the [place] FDA and has been authorized for detection and/or diagnosis of SARS-CoV-2 by FDA under an Emergency Use Authorization (EUA). This EUA will remain in effect (meaning this test can be used) for the duration of the COVID-19 declaration under Section 564(b)(1) of the Act, 21 U.S.C. section 360bbb-3(b)(1), unless the authorization is terminated or revoked.  Performed at [facility], [place] [address], [place], [place] [address]    Time spent: 30 min  SIGNED:   Marylu Lund, MD  [facility] 10/11/2021, 1:08 PM  If 7PM-7AM, please contact night-coverage

## 2021-10-11 NOTE — Progress Notes (Signed)
Patient discharged home with family, discharge instructions given and explained to patient, he verbalized understanding, patient denies any pain/distress, no wound noted. Accompanied home by daughter.

## 2021-10-12 ENCOUNTER — Other Ambulatory Visit: Payer: Self-pay | Admitting: General Surgery

## 2021-10-12 DIAGNOSIS — K831 Obstruction of bile duct: Secondary | ICD-10-CM

## 2021-10-13 LAB — CULTURE, BLOOD (ROUTINE X 2)
Culture: NO GROWTH
Special Requests: ADEQUATE

## 2021-10-18 ENCOUNTER — Ambulatory Visit
Admission: RE | Admit: 2021-10-18 | Discharge: 2021-10-18 | Disposition: A | Discharge status: Home / Self Care | Payer: BC Managed Care – PPO | Source: Ambulatory Visit | Attending: General Surgery | Admitting: General Surgery

## 2021-10-18 DIAGNOSIS — K828 Other specified diseases of gallbladder: Secondary | ICD-10-CM | POA: Diagnosis not present

## 2021-10-18 DIAGNOSIS — K831 Obstruction of bile duct: Secondary | ICD-10-CM | POA: Diagnosis not present

## 2021-10-18 DIAGNOSIS — I700 Atherosclerosis of aorta: Secondary | ICD-10-CM | POA: Diagnosis not present

## 2021-10-18 MED ORDER — IOPAMIDOL (ISOVUE-300) INJECTION 61%
100.0000 mL | Freq: Once | INTRAVENOUS | Status: AC | PRN
  Administered 2021-10-18: 100 mL via INTRAVENOUS

## 2021-10-19 DIAGNOSIS — R945 Abnormal results of liver function studies: Secondary | ICD-10-CM | POA: Diagnosis not present

## 2021-10-19 DIAGNOSIS — R7989 Other specified abnormal findings of blood chemistry: Secondary | ICD-10-CM | POA: Diagnosis not present

## 2021-10-19 DIAGNOSIS — R17 Unspecified jaundice: Secondary | ICD-10-CM | POA: Diagnosis not present

## 2021-10-25 ENCOUNTER — Other Ambulatory Visit: Payer: Self-pay | Admitting: General Surgery

## 2021-10-25 DIAGNOSIS — C801 Malignant (primary) neoplasm, unspecified: Secondary | ICD-10-CM | POA: Diagnosis not present

## 2021-10-25 DIAGNOSIS — K9189 Other postprocedural complications and disorders of digestive system: Secondary | ICD-10-CM | POA: Diagnosis not present

## 2021-10-25 DIAGNOSIS — E440 Moderate protein-calorie malnutrition: Secondary | ICD-10-CM | POA: Diagnosis not present

## 2021-10-25 DIAGNOSIS — C240 Malignant neoplasm of extrahepatic bile duct: Secondary | ICD-10-CM

## 2021-10-25 DIAGNOSIS — K831 Obstruction of bile duct: Secondary | ICD-10-CM | POA: Diagnosis not present

## 2021-10-25 DIAGNOSIS — D638 Anemia in other chronic diseases classified elsewhere: Secondary | ICD-10-CM | POA: Diagnosis not present

## 2021-11-04 DIAGNOSIS — I82409 Acute embolism and thrombosis of unspecified deep veins of unspecified lower extremity: Secondary | ICD-10-CM

## 2021-11-04 HISTORY — DX: Acute embolism and thrombosis of unspecified deep veins of unspecified lower extremity: I82.409

## 2021-11-08 NOTE — Progress Notes (Signed)
Surgical Instructions    Your procedure is scheduled on Friday, December 9th, 2022.   Report to [facility] Main Entrance "A" at 07:00 A.M., then check in with the Admitting office.  Call this number if you have problems the morning of surgery:  [phone]   If you have any questions prior to your surgery date call [phone]: Open Monday-Friday 8am-4pm    Remember:  Do not eat after midnight the night before your surgery  You may drink clear liquids until 06:00 the morning of your surgery.   Clear liquids allowed are: Water, Non-Citrus Juices (without pulp), Carbonated Beverages, Clear Tea, Black Coffee ONLY (NO MILK, CREAM OR POWDERED CREAMER of any kind), and Gatorade    Take these medicines the morning of surgery with A SIP OF WATER:  levothyroxine (SYNTHROID)  pantoprazole (PROTONIX)  ALPRAZolam (XANAX) - as needed   As of today, STOP taking any Aspirin (unless otherwise instructed by your surgeon) Aleve, Naproxen, Ibuprofen, Motrin, Advil, Goody's, BC's, all herbal medications, fish oil, and all vitamins.   After your COVID test   You are not required to quarantine however you are required to wear a well-fitting mask when you are out and around people not in your household.  If your mask becomes wet or soiled, replace with a new one.  Wash your hands often with soap and water for 20 seconds or clean your hands with an alcohol-based hand sanitizer that contains at least 60% alcohol.  Do not share personal items.  Notify your provider: if you are in close contact with someone who has COVID  or if you develop a fever of 100.4 or greater, sneezing, cough, sore throat, shortness of breath or body aches.    The day of surgery:          Do not wear jewelry  Do not wear lotions, powders, colognes, or deodorant. Men may shave face and neck. Do not bring valuables to the hospital.              [facility] is not responsible for any belongings or valuables.  Do NOT  Smoke (Tobacco/Vaping)  24 hours prior to your procedure  If you use a CPAP at night, you may bring your mask for your overnight stay.   Contacts, glasses, hearing aids, dentures or partials may not be worn into surgery, please bring cases for these belongings   For patients admitted to the hospital, discharge time will be determined by your treatment team.   Patients discharged the day of surgery will not be allowed to drive home, and someone needs to stay with them for 24 hours.  NO VISITORS WILL BE ALLOWED IN PRE-OP WHERE PATIENTS ARE PREPPED FOR SURGERY.  ONLY 1 SUPPORT PERSON MAY BE PRESENT IN THE WAITING ROOM WHILE YOU ARE IN SURGERY.  IF YOU ARE TO BE ADMITTED, ONCE YOU ARE IN YOUR ROOM YOU WILL BE ALLOWED TWO (2) VISITORS. 1 (ONE) VISITOR MAY STAY OVERNIGHT BUT MUST ARRIVE TO THE ROOM BY 8pm.  Minor children may have two parents present. Special consideration for safety and communication needs will be reviewed on a case by case basis.  Special instructions:    Oral Hygiene is also important to reduce your risk of infection.  Remember - BRUSH YOUR TEETH THE MORNING OF SURGERY WITH YOUR REGULAR TOOTHPASTE   [facility]- Preparing For Surgery  Before surgery, you can play an important role. Because skin is not sterile, your skin needs to be as free  of germs as possible. You can reduce the number of germs on your skin by washing with CHG (chlorahexidine gluconate) Soap before surgery.  CHG is an antiseptic cleaner which kills germs and bonds with the skin to continue killing germs even after washing.     Please do not use if you have an allergy to CHG or antibacterial soaps. If your skin becomes reddened/irritated stop using the CHG.  Do not shave (including legs and underarms) for at least 48 hours prior to first CHG shower. It is OK to shave your face.  Please follow these instructions carefully.     Shower the NIGHT BEFORE SURGERY and the MORNING OF SURGERY with CHG Soap.   If you  chose to wash your hair, wash your hair first as usual with your normal shampoo. After you shampoo, rinse your hair and body thoroughly to remove the shampoo.  Then [employer] and genitals (private parts) with your normal soap and rinse thoroughly to remove soap.  After that Use CHG Soap as you would any other liquid soap. You can apply CHG directly to the skin and wash gently with a scrungie or a clean washcloth.   Apply the CHG Soap to your body ONLY FROM THE NECK DOWN.  Do not use on open wounds or open sores. Avoid contact with your eyes, ears, mouth and genitals (private parts). Wash Face and genitals (private parts)  with your normal soap.   Wash thoroughly, paying special attention to the area where your surgery will be performed.  Thoroughly rinse your body with warm water from the neck down.  DO NOT shower/wash with your normal soap after using and rinsing off the CHG Soap.  Pat yourself dry with a CLEAN TOWEL.  Wear CLEAN PAJAMAS to bed the night before surgery  Place CLEAN SHEETS on your bed the night before your surgery  DO NOT SLEEP WITH PETS.   Day of Surgery:  Take a shower with CHG soap. Wear Clean/Comfortable clothing the morning of surgery Do not apply any deodorants/lotions.   Remember to brush your teeth WITH YOUR REGULAR TOOTHPASTE.   Please read over the following fact sheets that you were given.

## 2021-11-09 ENCOUNTER — Encounter (HOSPITAL_COMMUNITY): Payer: Self-pay

## 2021-11-09 ENCOUNTER — Other Ambulatory Visit: Payer: Self-pay

## 2021-11-09 ENCOUNTER — Encounter (HOSPITAL_COMMUNITY)
Admission: RE | Admit: 2021-11-09 | Discharge: 2021-11-09 | Disposition: A | Discharge status: Home / Self Care | Payer: BC Managed Care – PPO | Source: Ambulatory Visit | Attending: General Surgery | Admitting: General Surgery

## 2021-11-09 VITALS — BP 138/89 | HR 62 | Temp 97.8°F | Resp 17 | Ht 71.0 in | Wt 174.9 lb

## 2021-11-09 DIAGNOSIS — Z87891 Personal history of nicotine dependence: Secondary | ICD-10-CM | POA: Diagnosis not present

## 2021-11-09 DIAGNOSIS — C259 Malignant neoplasm of pancreas, unspecified: Secondary | ICD-10-CM | POA: Diagnosis not present

## 2021-11-09 DIAGNOSIS — G8918 Other acute postprocedural pain: Secondary | ICD-10-CM | POA: Diagnosis not present

## 2021-11-09 DIAGNOSIS — Z01818 Encounter for other preprocedural examination: Secondary | ICD-10-CM

## 2021-11-09 DIAGNOSIS — C250 Malignant neoplasm of head of pancreas: Secondary | ICD-10-CM | POA: Diagnosis not present

## 2021-11-09 DIAGNOSIS — R739 Hyperglycemia, unspecified: Secondary | ICD-10-CM | POA: Diagnosis not present

## 2021-11-09 DIAGNOSIS — Z20822 Contact with and (suspected) exposure to covid-19: Secondary | ICD-10-CM | POA: Insufficient documentation

## 2021-11-09 DIAGNOSIS — F319 Bipolar disorder, unspecified: Secondary | ICD-10-CM | POA: Diagnosis not present

## 2021-11-09 DIAGNOSIS — K2980 Duodenitis without bleeding: Secondary | ICD-10-CM | POA: Diagnosis not present

## 2021-11-09 DIAGNOSIS — Z01812 Encounter for preprocedural laboratory examination: Secondary | ICD-10-CM | POA: Insufficient documentation

## 2021-11-09 DIAGNOSIS — C240 Malignant neoplasm of extrahepatic bile duct: Secondary | ICD-10-CM | POA: Insufficient documentation

## 2021-11-09 DIAGNOSIS — C772 Secondary and unspecified malignant neoplasm of intra-abdominal lymph nodes: Secondary | ICD-10-CM | POA: Diagnosis not present

## 2021-11-09 DIAGNOSIS — E039 Hypothyroidism, unspecified: Secondary | ICD-10-CM | POA: Diagnosis not present

## 2021-11-09 DIAGNOSIS — K3189 Other diseases of stomach and duodenum: Secondary | ICD-10-CM | POA: Diagnosis not present

## 2021-11-09 DIAGNOSIS — Z6824 Body mass index (BMI) 24.0-24.9, adult: Secondary | ICD-10-CM | POA: Diagnosis not present

## 2021-11-09 DIAGNOSIS — D62 Acute posthemorrhagic anemia: Secondary | ICD-10-CM | POA: Diagnosis not present

## 2021-11-09 DIAGNOSIS — Z79899 Other long term (current) drug therapy: Secondary | ICD-10-CM | POA: Diagnosis not present

## 2021-11-09 DIAGNOSIS — K567 Ileus, unspecified: Secondary | ICD-10-CM | POA: Diagnosis not present

## 2021-11-09 DIAGNOSIS — D638 Anemia in other chronic diseases classified elsewhere: Secondary | ICD-10-CM | POA: Diagnosis not present

## 2021-11-09 DIAGNOSIS — R609 Edema, unspecified: Secondary | ICD-10-CM | POA: Diagnosis not present

## 2021-11-09 DIAGNOSIS — K859 Acute pancreatitis without necrosis or infection, unspecified: Secondary | ICD-10-CM | POA: Diagnosis not present

## 2021-11-09 DIAGNOSIS — I700 Atherosclerosis of aorta: Secondary | ICD-10-CM | POA: Diagnosis not present

## 2021-11-09 DIAGNOSIS — E079 Disorder of thyroid, unspecified: Secondary | ICD-10-CM | POA: Diagnosis not present

## 2021-11-09 DIAGNOSIS — K831 Obstruction of bile duct: Secondary | ICD-10-CM | POA: Diagnosis not present

## 2021-11-09 DIAGNOSIS — E871 Hypo-osmolality and hyponatremia: Secondary | ICD-10-CM | POA: Diagnosis not present

## 2021-11-09 DIAGNOSIS — Z79890 Hormone replacement therapy (postmenopausal): Secondary | ICD-10-CM | POA: Diagnosis not present

## 2021-11-09 DIAGNOSIS — R634 Abnormal weight loss: Secondary | ICD-10-CM | POA: Diagnosis not present

## 2021-11-09 DIAGNOSIS — C249 Malignant neoplasm of biliary tract, unspecified: Secondary | ICD-10-CM | POA: Diagnosis not present

## 2021-11-09 DIAGNOSIS — K5939 Other megacolon: Secondary | ICD-10-CM | POA: Diagnosis not present

## 2021-11-09 DIAGNOSIS — K6389 Other specified diseases of intestine: Secondary | ICD-10-CM | POA: Diagnosis not present

## 2021-11-09 DIAGNOSIS — Z888 Allergy status to other drugs, medicaments and biological substances status: Secondary | ICD-10-CM | POA: Diagnosis not present

## 2021-11-09 DIAGNOSIS — R531 Weakness: Secondary | ICD-10-CM | POA: Diagnosis not present

## 2021-11-09 HISTORY — DX: Gastro-esophageal reflux disease without esophagitis: K21.9

## 2021-11-09 HISTORY — DX: Hypothyroidism, unspecified: E03.9

## 2021-11-09 LAB — CBC WITH DIFFERENTIAL/PLATELET
Abs Immature Granulocytes: 0.02 10*3/uL (ref 0.00–0.07)
Basophils Absolute: 0.1 10*3/uL (ref 0.0–0.1)
Basophils Relative: 1 %
Eosinophils Absolute: 0.1 10*3/uL (ref 0.0–0.5)
Eosinophils Relative: 2 %
HCT: 41.9 % (ref 39.0–52.0)
Hemoglobin: 14.1 g/dL (ref 13.0–17.0)
Immature Granulocytes: 0 %
Lymphocytes Relative: 38 %
Lymphs Abs: 2.6 10*3/uL (ref 0.7–4.0)
MCH: 35.9 pg — ABNORMAL HIGH (ref 26.0–34.0)
MCHC: 33.7 g/dL (ref 30.0–36.0)
MCV: 106.6 fL — ABNORMAL HIGH (ref 80.0–100.0)
Monocytes Absolute: 0.4 10*3/uL (ref 0.1–1.0)
Monocytes Relative: 6 %
Neutro Abs: 3.6 10*3/uL (ref 1.7–7.7)
Neutrophils Relative %: 53 %
Platelets: 228 10*3/uL (ref 150–400)
RBC: 3.93 MIL/uL — ABNORMAL LOW (ref 4.22–5.81)
RDW: 14.6 % (ref 11.5–15.5)
WBC: 6.9 10*3/uL (ref 4.0–10.5)
nRBC: 0 % (ref 0.0–0.2)

## 2021-11-09 LAB — HEMOGLOBIN A1C
Hgb A1c MFr Bld: 3.6 % — ABNORMAL LOW (ref 4.8–5.6)
Mean Plasma Glucose: 56.62 mg/dL

## 2021-11-09 LAB — COMPREHENSIVE METABOLIC PANEL
ALT: 36 U/L (ref 0–44)
AST: 28 U/L (ref 15–41)
Albumin: 3.7 g/dL (ref 3.5–5.0)
Alkaline Phosphatase: 166 U/L — ABNORMAL HIGH (ref 38–126)
Anion gap: 9 (ref 5–15)
BUN: 31 mg/dL — ABNORMAL HIGH (ref 6–20)
CO2: 27 mmol/L (ref 22–32)
Calcium: 10 mg/dL (ref 8.9–10.3)
Chloride: 102 mmol/L (ref 98–111)
Creatinine, Ser: 0.86 mg/dL (ref 0.61–1.24)
GFR, Estimated: >60 mL/min (ref >60)
Glucose, Bld: 96 mg/dL (ref 70–99)
Potassium: 4.3 mmol/L (ref 3.5–5.1)
Sodium: 138 mmol/L (ref 135–145)
Total Bilirubin: 2.6 mg/dL — ABNORMAL HIGH (ref 0.3–1.2)
Total Protein: 7 g/dL (ref 6.5–8.1)

## 2021-11-09 LAB — PROTIME-INR
INR: 1 (ref 0.8–1.2)
Prothrombin Time: 12.7 seconds (ref 11.4–15.2)

## 2021-11-09 LAB — PREPARE RBC (CROSSMATCH)

## 2021-11-09 NOTE — Progress Notes (Addendum)
PCP - Scifres, Dorothy, PA-C Cardiologist - denies  PPM/ICD - denies Device Orders - n/a Rep Notified - n/a  Chest x-ray - 10/04/2021 EKG - 10/06/2021 Stress Test - denies ECHO - denies Cardiac Cath - denies  Sleep Study - denies CPAP - n/a  Fasting Blood Sugar - n/a  Blood Thinner Instructions: n/a  Aspirin Instructions: Patient was instructed: As of today, STOP taking any Aspirin (unless otherwise instructed by your surgeon) Aleve, Naproxen, Ibuprofen, Motrin, Advil, Goody's, BC's, all herbal medications, fish oil, and all vitamins  ERAS Protcol - yes PRE-SURGERY Ensure or G2- no  COVID TEST- done in PAT on 11/09/2021   Anesthesia review: yes - abnormal labs in PAT. MD notified  Patient denies shortness of breath, fever, cough and chest pain at PAT appointment   All instructions explained to the patient, with a verbal understanding of the material. Patient agrees to go over the instructions while at home for a better understanding. Patient also instructed to self quarantine after being tested for COVID-19. The opportunity to ask questions was provided.

## 2021-11-09 NOTE — Progress Notes (Signed)
Abnormal labs in PAT. Dr. Barry Dienes office was called and these results were communicated to the surgical scheduler

## 2021-11-10 LAB — SARS CORONAVIRUS 2 (TAT 6-24 HRS): SARS Coronavirus 2: NEGATIVE

## 2021-11-10 NOTE — H&P (Signed)
Reason for Consult:Malignant biliary stricture Referring Physician: Mansouraty   Dean [name] is an 55 y.o. male.  HPI:  Pt is a 55 yo M who developed elevated LFTs and had intra/extrahepatic biliary dilation noted in mid October 2022 on RUQ u/s. He subsequently had an MRCP demonstrating a stricture in the distal common bile duct in the pancreas. Imaging has been vague regarding whether there is a true mass present. This includes MRI and EUS as well as CT (regular contrasted CT, not triple phase). He was admitted 10/31 after he developed post ERCP pancreatitis. His t bili is coming down from 29.2 and is now 17.1. He has no family history of cancer or pancreatitis of which he is aware.. He has had about a 15-20 pound weight loss since dx.   He works as a Librarian, academic in Water quality scientist. He has never had pancreatitis before. He drank around 6 beers per day for around 18 months prior to this dx.   He was discharged 10/11/2021. His pancreatitis is improved. He had CT pancreatic protocol that shows resolution of pancreatitis, demonstrates stricture, but also doesn't show a mass. His pain has resolved. He is still a bit tired. His jaundice has not completely resolved. He is eating quite a bit of protein daily and is getting in at a minimum 1500 calories per day.   He got repeat labs last week at [facility]. CA 19-9 was 113. T bili was 7.7, Alb 3.6, Cr normal.   CT abd/pelvis triple phase pancreatic protocol 10/18/21 IMPRESSION: 1. Previously seen inflammatory fat stranding and fluid about the pancreas is markedly diminished, nearly resolved, with minimal residual fluid about the pancreatic tail and adjacent retroperitoneum. Findings are consistent with improved pancreatitis. 2. Unchanged intra and extrahepatic biliary ductal dilatation with pneumobilia status post common bile duct stenting. There is again noted a sharp narrowing of the common bile duct within the pancreatic head, in keeping with reported  history of common bile duct stricture. 3. Common bile duct stent remains in position with tip in the duodenum. 4. Mild gallbladder wall thickening, similar to prior examination. No gallstones.   Aortic Atherosclerosis (ICD10-I70.0).    Past Medical History:  Diagnosis Date   Bipolar 1 disorder ([place])     Thyroid disease         Past Surgical History:  Procedure Laterality Date   BACK SURGERY       BILIARY BRUSHING   10/04/2021    Procedure: BILIARY BRUSHING; Surgeon: Clarene Essex, MD; Location: [facility]; Service: Endoscopy;;   BILIARY STENT PLACEMENT N/A 10/04/2021    Procedure: BILIARY STENT PLACEMENT; Surgeon: Clarene Essex, MD; Location: [facility]; Service: Endoscopy; Laterality: N/A;   ERCP Bilateral 10/04/2021    Procedure: ENDOSCOPIC RETROGRADE CHOLANGIOPANCREATOGRAPHY (ERCP); Surgeon: Clarene Essex, MD; Location: [facility]; Service: Endoscopy; Laterality: Bilateral;   ESOPHAGOGASTRODUODENOSCOPY (EGD) WITH PROPOFOL N/A 10/04/2021    Procedure: ESOPHAGOGASTRODUODENOSCOPY (EGD) WITH PROPOFOL; Surgeon: Clarene Essex, MD; Location: [facility]; Service: Endoscopy; Laterality: N/A;   FINE NEEDLE ASPIRATION   10/04/2021    Procedure: FINE NEEDLE ASPIRATION (FNA) LINEAR; Surgeon: Clarene Essex, MD; Location: [facility]; Service: Endoscopy;;   FOREIGN BODY REMOVAL   10/04/2021    Procedure: FOREIGN BODY REMOVAL; Surgeon: Clarene Essex, MD; Location: [facility]; Service: Endoscopy;;   KNEE SURGERY       NOSE SURGERY       PANCREATIC STENT PLACEMENT   10/04/2021    Procedure: PANCREATIC STENT PLACEMENT; Surgeon: Clarene Essex, MD; Location: [facility]; Service: Endoscopy;;  ROTATOR CUFF REPAIR       SPHINCTEROTOMY   10/04/2021    Procedure: SPHINCTEROTOMY; Surgeon: Clarene Essex, MD; Location: [facility]; Service: Endoscopy;;   STENT REMOVAL   10/04/2021    Procedure: STENT REMOVAL; Surgeon: Clarene Essex, MD; Location: [facility]; Service: Endoscopy;;   UPPER ESOPHAGEAL  ENDOSCOPIC ULTRASOUND (EUS)   10/04/2021    Procedure: UPPER ESOPHAGEAL ENDOSCOPIC ULTRASOUND (EUS); Surgeon: Clarene Essex, MD; Location: [facility]; Service: Endoscopy;;      History reviewed. No pertinent family history.   Social History: reports that he has quit smoking. He has never used smokeless tobacco. He reports current alcohol use. He reports that he does not use drugs.   Allergies:   Allergies  Allergen Reactions   Erythromycin Nausea Only   Ibuprofen Other (See Comments)      Peptic Ulcer nsaids      Medications:  Active Medications   Current Meds  Medication Sig   divalproex (DEPAKOTE ER) 500 MG 24 hr tablet TAKE 5 TABLETS (2,500 MG TOTAL) BY MOUTH DAILY. (Patient taking differently: Take 2,000 mg by mouth at bedtime.)   levothyroxine (SYNTHROID) 137 MCG tablet Take 137 mcg by mouth daily before breakfast.   Multiple Vitamins-Minerals (ONE DAILY MENS 50+ MULTIVIT) TABS Take 1 tablet by mouth daily.         Lab Results Last 48 Hours   Results for orders placed or performed during the hospital encounter of 10/04/21 (from the past 48 hour(s))  Cancer antigen 19-9 Status: Abnormal    Collection Time: 10/07/21 1:14 PM  Result Value Ref Range    CA 19-9 85 (H) 0 - 35 U/mL      Comment: (NOTE) **Verified by repeat analysis** Roche Diagnostics Electrochemiluminescence Immunoassay (ECLIA) Values obtained with different assay methods or kits cannot be used interchangeably. Results cannot be interpreted as absolute evidence of the presence or absence of malignant disease. Performed At: [facility] [place], [place] [id number] Rush Farmer MD SA:[id number]    Comprehensive metabolic panel Status: Abnormal    Collection Time: 10/08/21 4:21 AM  Result Value Ref Range    Sodium 131 (L) 135 - 145 mmol/L    Potassium 3.3 (L) 3.5 - 5.1 mmol/L      Comment: DELTA CHECK NOTED    Chloride 100 98 - 111 mmol/L    CO2 26 22 - 32 mmol/L    Glucose,  Bld 102 (H) 70 - 99 mg/dL      Comment: Glucose reference range applies only to samples taken after fasting for at least 8 hours.    BUN 7 6 - 20 mg/dL    Creatinine, Ser 0.57 (L) 0.61 - 1.24 mg/dL    Calcium 8.6 (L) 8.9 - 10.3 mg/dL    Total Protein 5.0 (L) 6.5 - 8.1 g/dL    Albumin 2.2 (L) 3.5 - 5.0 g/dL    AST 308 (H) 15 - 41 U/L    ALT 406 (H) 0 - 44 U/L    Alkaline Phosphatase 638 (H) 38 - 126 U/L    Total Bilirubin 20.1 (HH) 0.3 - 1.2 mg/dL      Comment: CRITICAL RESULT CALLED TO, READ BACK BY AND VERIFIED WITH: CHLOE ADDISON RN 10/08/21 @ 0507 VS      GFR, Estimated >60 >60 mL/min      Comment: (NOTE) Calculated using the CKD-EPI Creatinine Equation (2021)      Anion gap 5 5 - 15      Comment:  Performed at [facility], [place] [address] [place], [place] [address]  CBC Status: Abnormal    Collection Time: 10/08/21 4:21 AM  Result Value Ref Range    WBC 13.4 (H) 4.0 - 10.5 K/uL    RBC 3.22 (L) 4.22 - 5.81 MIL/uL    Hemoglobin 10.8 (L) 13.0 - 17.0 g/dL    HCT 29.7 (L) 39.0 - 52.0 %    MCV 92.2 80.0 - 100.0 fL    MCH 33.5 26.0 - 34.0 pg    MCHC 36.4 (H) 30.0 - 36.0 g/dL    RDW 20.7 (H) 11.5 - 15.5 %    Platelets 172 150 - 400 K/uL    nRBC 0.0 0.0 - 0.2 %      Comment: Performed at [facility], [place] [address], [place], [place] [address]  CBC Status: Abnormal    Collection Time: 10/09/21 5:11 AM  Result Value Ref Range    WBC 11.9 (H) 4.0 - 10.5 K/uL    RBC 3.08 (L) 4.22 - 5.81 MIL/uL    Hemoglobin 10.3 (L) 13.0 - 17.0 g/dL    HCT 28.4 (L) 39.0 - 52.0 %    MCV 92.2 80.0 - 100.0 fL    MCH 33.4 26.0 - 34.0 pg    MCHC 36.3 (H) 30.0 - 36.0 g/dL    RDW 21.2 (H) 11.5 - 15.5 %    Platelets 210 150 - 400 K/uL    nRBC 0.0 0.0 - 0.2 %      Comment: Performed at [facility], [place] [address] [place], [place] [address]  Comprehensive metabolic panel Status: Abnormal    Collection Time: 10/09/21 5:11 AM  Result Value Ref  Range    Sodium 132 (L) 135 - 145 mmol/L    Potassium 3.5 3.5 - 5.1 mmol/L    Chloride 99 98 - 111 mmol/L    CO2 26 22 - 32 mmol/L    Glucose, Bld 102 (H) 70 - 99 mg/dL      Comment: Glucose reference range applies only to samples taken after fasting for at least 8 hours.    BUN 8 6 - 20 mg/dL    Creatinine, Ser 0.65 0.61 - 1.24 mg/dL    Calcium 8.7 (L) 8.9 - 10.3 mg/dL    Total Protein 5.7 (L) 6.5 - 8.1 g/dL    Albumin 2.0 (L) 3.5 - 5.0 g/dL    AST 239 (H) 15 - 41 U/L    ALT 342 (H) 0 - 44 U/L    Alkaline Phosphatase 612 (H) 38 - 126 U/L    Total Bilirubin 17.1 (H) 0.3 - 1.2 mg/dL    GFR, Estimated >60 >60 mL/min      Comment: (NOTE) Calculated using the CKD-EPI Creatinine Equation (2021)      Anion gap 7 5 - 15      Comment: Performed at [facility], [place] [address] [place], [place] [address]       Imaging Results (Last 48 hours)  No results found.     Review of Systems  Constitutional: Positive for appetite change and unexpected weight change.  Skin: Positive for color change.  Nausea.  All other systems reviewed and are negative.   Blood pressure 133/79, pulse 70, temperature 99.4 F (37.4 C), temperature source Oral, resp. rate 16, height 5\' 11"  (1.803 m), weight 80.3 kg, SpO2 98 %. Physical Exam Vitals reviewed.  Constitutional:  General: He is not in acute  distress. Appearance: He is well-developed. He is not ill-appearing.  HENT:  Head: Normocephalic and atraumatic.  Mouth/Throat:  Mouth: Mucous membranes are moist.  Eyes:  General: Scleral icterus present.  Extraocular Movements: Extraocular movements intact.  Pupils: Pupils are equal, round, and reactive to light.  Cardiovascular:  Rate and Rhythm: Normal rate and regular rhythm.  Pulmonary:  Effort: Pulmonary effort is normal.  Breath sounds: No stridor.  Abdominal:  General: There is no distension or abdominal bruit. There are no signs of injury.  Palpations: Abdomen is soft.   Tenderness: There is no abdominal tenderness. There is no guarding.  Hernia: No hernia is present.  Skin: General: Skin is warm.  Capillary Refill: Capillary refill takes 2 to 3 seconds.  Coloration: Skin is jaundiced. Skin is not mottled or pale.  Findings: No erythema or rash.  Neurological:  General: No focal deficit present.  Mental Status: He is alert and oriented to person, place, and time.  Cranial Nerves: No cranial nerve deficit.  Motor: Weakness present.  Psychiatric:  Mood and Affect: Mood normal.  Behavior: Behavior normal.    Assessment and Plan   Anemia of chronic disease Will recheck this now that he has been out of the hospital a bit and pancreatitis is resolved.   Biliary obstruction due to presumed malignant neoplasm (CMS-HCC) Despite the lack of mass visible on imaging, I recommend whipple due to high likely hood of small pancreatic cancer or cholangiocarcinoma of the distal common bile duct.  I discussed the surgery with the patient including diagrams of anatomy. I discussed the potential for diagnostic laparoscopy. In the case of pancreatic cancer, if spread of the disease is found, we will abort the procedure and not proceed with resection. The rationale for this was discussed with the patient. There has not been data to support resection of Stage IV disease in terms of survival benefit.   We discussed possible complications including: Potential of aborting procedure if tumor is invading the superior mesenteric or hepatic arteries Bleeding Infection and possible wound complications such as hernia Damage to adjacent structures Leak of anastamoses, primarily pancreatic Possible need for other procedures Possible prolonged nausea with possible need for external feeding.  Possible prolonged hospital stay. Possible development of diabetes or worsening of current diabetes.  Possible pancreatic exocrine insufficiency Prolonged fatigue/weakness/appetite Possible  early recurrence of cancer  The patient understands and wishes to proceed.  The patient has been advised to turn in disability paperwork to our office.    Protein-calorie malnutrition, moderate (CMS-HCC) Pt's albumin was very low while an inpatient. Will check prealbumin to assess improvement.   Post-ERCP acute pancreatitis Resolved.

## 2021-11-11 NOTE — Anesthesia Preprocedure Evaluation (Addendum)
Anesthesia Evaluation  Patient identified by MRN, date of birth, ID band Patient awake    Reviewed: Allergy & Precautions, NPO status , Patient's Chart, lab work & pertinent test results  Airway Mallampati: II  TM Distance: >3 FB Neck ROM: Full    Dental no notable dental hx.    Pulmonary former smoker,    Pulmonary exam normal breath sounds clear to auscultation       Cardiovascular Exercise Tolerance: Good negative cardio ROS Normal cardiovascular exam Rhythm:Regular Rate:Normal     Neuro/Psych PSYCHIATRIC DISORDERS Bipolar Disorder    GI/Hepatic Neg liver ROS, GERD  ,Bile duct mass   Endo/Other  Hypothyroidism   Renal/GU negative Renal ROS  negative genitourinary   Musculoskeletal negative musculoskeletal ROS (+)   Abdominal   Peds negative pediatric ROS (+)  Hematology negative hematology ROS (+)   Anesthesia Other Findings   Reproductive/Obstetrics negative OB ROS                            Anesthesia Physical Anesthesia Plan  ASA: 3  Anesthesia Plan: General and Epidural   Post-op Pain Management: Tylenol PO (pre-op)   Induction: Intravenous  PONV Risk Score and Plan: 2 and Treatment may vary due to age or medical condition  Airway Management Planned: Oral ETT  Additional Equipment: Arterial line  Intra-op Plan:   Post-operative Plan: Extubation in OR  Informed Consent: I have reviewed the patients History and Physical, chart, labs and discussed the procedure including the risks, benefits and alternatives for the proposed anesthesia with the patient or authorized representative who has indicated his/her understanding and acceptance.       Plan Discussed with: CRNA, Anesthesiologist and Surgeon  Anesthesia Plan Comments: (GETA. Two 18 gauge IV. + arterial line. + epidural. Norton Blizzard, MD  )       Anesthesia Quick Evaluation

## 2021-11-12 ENCOUNTER — Inpatient Hospital Stay (HOSPITAL_COMMUNITY)
Admission: RE | Admit: 2021-11-12 | Discharge: 2021-11-27 | DRG: 406 | Disposition: A | Discharge status: Home Health | Payer: BC Managed Care – PPO | Attending: General Surgery | Admitting: General Surgery

## 2021-11-12 ENCOUNTER — Encounter (HOSPITAL_COMMUNITY): Payer: Self-pay | Admitting: General Surgery

## 2021-11-12 ENCOUNTER — Encounter (HOSPITAL_COMMUNITY)
Admission: RE | Disposition: A | Discharge status: Home / Self Care | Payer: Self-pay | Source: Home / Self Care | Attending: General Surgery

## 2021-11-12 ENCOUNTER — Other Ambulatory Visit: Payer: Self-pay

## 2021-11-12 ENCOUNTER — Inpatient Hospital Stay (HOSPITAL_COMMUNITY): Payer: BC Managed Care – PPO | Admitting: Anesthesiology

## 2021-11-12 DIAGNOSIS — C801 Malignant (primary) neoplasm, unspecified: Secondary | ICD-10-CM | POA: Diagnosis present

## 2021-11-12 DIAGNOSIS — Z888 Allergy status to other drugs, medicaments and biological substances status: Secondary | ICD-10-CM

## 2021-11-12 DIAGNOSIS — D638 Anemia in other chronic diseases classified elsewhere: Secondary | ICD-10-CM | POA: Diagnosis present

## 2021-11-12 DIAGNOSIS — E079 Disorder of thyroid, unspecified: Secondary | ICD-10-CM | POA: Diagnosis present

## 2021-11-12 DIAGNOSIS — R609 Edema, unspecified: Secondary | ICD-10-CM | POA: Diagnosis not present

## 2021-11-12 DIAGNOSIS — Z79899 Other long term (current) drug therapy: Secondary | ICD-10-CM | POA: Diagnosis not present

## 2021-11-12 DIAGNOSIS — K567 Ileus, unspecified: Secondary | ICD-10-CM | POA: Diagnosis present

## 2021-11-12 DIAGNOSIS — D62 Acute posthemorrhagic anemia: Secondary | ICD-10-CM | POA: Diagnosis not present

## 2021-11-12 DIAGNOSIS — F319 Bipolar disorder, unspecified: Secondary | ICD-10-CM | POA: Diagnosis present

## 2021-11-12 DIAGNOSIS — R634 Abnormal weight loss: Secondary | ICD-10-CM | POA: Diagnosis present

## 2021-11-12 DIAGNOSIS — I700 Atherosclerosis of aorta: Secondary | ICD-10-CM | POA: Diagnosis present

## 2021-11-12 DIAGNOSIS — Z6824 Body mass index (BMI) 24.0-24.9, adult: Secondary | ICD-10-CM

## 2021-11-12 DIAGNOSIS — C259 Malignant neoplasm of pancreas, unspecified: Principal | ICD-10-CM | POA: Diagnosis present

## 2021-11-12 DIAGNOSIS — E871 Hypo-osmolality and hyponatremia: Secondary | ICD-10-CM | POA: Diagnosis not present

## 2021-11-12 DIAGNOSIS — R739 Hyperglycemia, unspecified: Secondary | ICD-10-CM | POA: Diagnosis not present

## 2021-11-12 DIAGNOSIS — Z87891 Personal history of nicotine dependence: Secondary | ICD-10-CM | POA: Diagnosis not present

## 2021-11-12 DIAGNOSIS — C772 Secondary and unspecified malignant neoplasm of intra-abdominal lymph nodes: Secondary | ICD-10-CM | POA: Diagnosis present

## 2021-11-12 DIAGNOSIS — Z79890 Hormone replacement therapy (postmenopausal): Secondary | ICD-10-CM

## 2021-11-12 DIAGNOSIS — K831 Obstruction of bile duct: Secondary | ICD-10-CM | POA: Diagnosis present

## 2021-11-12 DIAGNOSIS — Z20822 Contact with and (suspected) exposure to covid-19: Secondary | ICD-10-CM | POA: Diagnosis present

## 2021-11-12 DIAGNOSIS — Z8509 Personal history of malignant neoplasm of other digestive organs: Secondary | ICD-10-CM | POA: Diagnosis present

## 2021-11-12 HISTORY — PX: LAPAROSCOPY: SHX197

## 2021-11-12 HISTORY — PX: WHIPPLE PROCEDURE: SHX2667

## 2021-11-12 HISTORY — DX: Malignant neoplasm of pancreas, unspecified: C25.9

## 2021-11-12 LAB — CBC
HCT: 34.6 % — ABNORMAL LOW (ref 39.0–52.0)
Hemoglobin: 11.8 g/dL — ABNORMAL LOW (ref 13.0–17.0)
MCH: 35.8 pg — ABNORMAL HIGH (ref 26.0–34.0)
MCHC: 34.1 g/dL (ref 30.0–36.0)
MCV: 104.8 fL — ABNORMAL HIGH (ref 80.0–100.0)
Platelets: 171 10*3/uL (ref 150–400)
RBC: 3.3 MIL/uL — ABNORMAL LOW (ref 4.22–5.81)
RDW: 13.8 % (ref 11.5–15.5)
WBC: 11 10*3/uL — ABNORMAL HIGH (ref 4.0–10.5)
nRBC: 0 % (ref 0.0–0.2)

## 2021-11-12 LAB — POCT I-STAT 7, (LYTES, BLD GAS, ICA,H+H)
Acid-base deficit: 3 mmol/L — ABNORMAL HIGH (ref 0.0–2.0)
Bicarbonate: 22.4 mmol/L (ref 20.0–28.0)
Calcium, Ion: 1.16 mmol/L (ref 1.15–1.40)
HCT: 30 % — ABNORMAL LOW (ref 39.0–52.0)
Hemoglobin: 10.2 g/dL — ABNORMAL LOW (ref 13.0–17.0)
O2 Saturation: 100 %
Patient temperature: 38
Potassium: 3.6 mmol/L (ref 3.5–5.1)
Sodium: 139 mmol/L (ref 135–145)
TCO2: 24 mmol/L (ref 22–32)
pCO2 arterial: 40.4 mmHg (ref 32.0–48.0)
pH, Arterial: 7.357 (ref 7.350–7.450)
pO2, Arterial: 266 mmHg — ABNORMAL HIGH (ref 83.0–108.0)

## 2021-11-12 LAB — MRSA NEXT GEN BY PCR, NASAL: MRSA by PCR Next Gen: NOT DETECTED

## 2021-11-12 LAB — PREPARE RBC (CROSSMATCH)

## 2021-11-12 LAB — CREATININE, SERUM
Creatinine, Ser: 1.01 mg/dL (ref 0.61–1.24)
GFR, Estimated: >60 mL/min (ref >60)

## 2021-11-12 LAB — ABO/RH: ABO/RH(D): O POS

## 2021-11-12 SURGERY — LAPAROSCOPY, DIAGNOSTIC
Anesthesia: Epidural | Site: Abdomen

## 2021-11-12 MED ORDER — PROPOFOL 10 MG/ML IV BOLUS
INTRAVENOUS | Status: AC
  Filled 2021-11-12: qty 20

## 2021-11-12 MED ORDER — VALPROATE SODIUM 100 MG/ML IV SOLN
850.0000 mg | Freq: Three times a day (TID) | INTRAVENOUS | Status: DC
  Administered 2021-11-12 – 2021-11-16 (×11): 850 mg via INTRAVENOUS
  Filled 2021-11-12 (×14): qty 8.5

## 2021-11-12 MED ORDER — ROCURONIUM BROMIDE 10 MG/ML (PF) SYRINGE
PREFILLED_SYRINGE | INTRAVENOUS | Status: DC | PRN
  Administered 2021-11-12 (×3): 20 mg via INTRAVENOUS
  Administered 2021-11-12: 100 mg via INTRAVENOUS
  Administered 2021-11-12: 20 mg via INTRAVENOUS

## 2021-11-12 MED ORDER — BUPIVACAINE-EPINEPHRINE (PF) 0.25% -1:200000 IJ SOLN
INTRAMUSCULAR | Status: AC
  Filled 2021-11-12: qty 30

## 2021-11-12 MED ORDER — LIDOCAINE 2% (20 MG/ML) 5 ML SYRINGE
INTRAMUSCULAR | Status: DC | PRN
  Administered 2021-11-12: 40 mg via INTRAVENOUS

## 2021-11-12 MED ORDER — DEXAMETHASONE SODIUM PHOSPHATE 10 MG/ML IJ SOLN
INTRAMUSCULAR | Status: AC
  Filled 2021-11-12: qty 1

## 2021-11-12 MED ORDER — ACETAMINOPHEN 10 MG/ML IV SOLN
1000.0000 mg | Freq: Four times a day (QID) | INTRAVENOUS | Status: AC
  Administered 2021-11-12 – 2021-11-13 (×4): 1000 mg via INTRAVENOUS
  Filled 2021-11-12 (×4): qty 100

## 2021-11-12 MED ORDER — MIDAZOLAM HCL 2 MG/2ML IJ SOLN
INTRAMUSCULAR | Status: DC | PRN
Start: 2021-11-12 — End: 2021-11-12
  Administered 2021-11-12 (×2): 1 mg via INTRAVENOUS

## 2021-11-12 MED ORDER — MIDAZOLAM HCL 2 MG/2ML IJ SOLN
INTRAMUSCULAR | Status: AC
  Filled 2021-11-12: qty 2

## 2021-11-12 MED ORDER — SUCCINYLCHOLINE CHLORIDE 200 MG/10ML IV SOSY
PREFILLED_SYRINGE | INTRAVENOUS | Status: AC
  Filled 2021-11-12: qty 10

## 2021-11-12 MED ORDER — DIPHENHYDRAMINE HCL 12.5 MG/5ML PO ELIX
12.5000 mg | ORAL_SOLUTION | Freq: Four times a day (QID) | ORAL | Status: DC | PRN
  Filled 2021-11-12: qty 5

## 2021-11-12 MED ORDER — PROCHLORPERAZINE EDISYLATE 10 MG/2ML IJ SOLN: 5.0000 mg | Freq: Four times a day (QID) | INTRAMUSCULAR | Status: DC | PRN

## 2021-11-12 MED ORDER — SODIUM CHLORIDE 0.9% FLUSH: 9.0000 mL | INTRAVENOUS | Status: DC | PRN

## 2021-11-12 MED ORDER — CEFAZOLIN SODIUM-DEXTROSE 2-4 GM/100ML-% IV SOLN
2.0000 g | Freq: Three times a day (TID) | INTRAVENOUS | Status: AC
  Administered 2021-11-12: 2 g via INTRAVENOUS
  Filled 2021-11-12: qty 100

## 2021-11-12 MED ORDER — LIDOCAINE 2% (20 MG/ML) 5 ML SYRINGE
INTRAMUSCULAR | Status: AC
  Filled 2021-11-12: qty 5

## 2021-11-12 MED ORDER — SODIUM CHLORIDE 0.9% IV SOLUTION: Freq: Once | INTRAVENOUS | Status: DC

## 2021-11-12 MED ORDER — LIDOCAINE HCL (PF) 1 % IJ SOLN
INTRAMUSCULAR | Status: AC
  Filled 2021-11-12: qty 30

## 2021-11-12 MED ORDER — ONDANSETRON HCL 4 MG/2ML IJ SOLN
4.0000 mg | Freq: Four times a day (QID) | INTRAMUSCULAR | Status: DC | PRN
  Filled 2021-11-12: qty 2

## 2021-11-12 MED ORDER — ROCURONIUM BROMIDE 10 MG/ML (PF) SYRINGE
PREFILLED_SYRINGE | INTRAVENOUS | Status: AC
  Filled 2021-11-12: qty 20

## 2021-11-12 MED ORDER — METHOCARBAMOL 1000 MG/10ML IJ SOLN
500.0000 mg | Freq: Three times a day (TID) | INTRAVENOUS | Status: DC | PRN
  Administered 2021-11-13: 500 mg via INTRAVENOUS
  Filled 2021-11-12: qty 500
  Filled 2021-11-12: qty 5

## 2021-11-12 MED ORDER — BUPIVACAINE HCL (PF) 0.25 % IJ SOLN
INTRAMUSCULAR | Status: DC | PRN
  Administered 2021-11-12: 8 mL via EPIDURAL
  Administered 2021-11-12: 2 mL via EPIDURAL

## 2021-11-12 MED ORDER — 0.9 % SODIUM CHLORIDE (POUR BTL) OPTIME
TOPICAL | Status: DC | PRN
  Administered 2021-11-12 (×2): 1000 mL

## 2021-11-12 MED ORDER — PROPOFOL 10 MG/ML IV BOLUS
INTRAVENOUS | Status: DC | PRN
  Administered 2021-11-12: 150 mg via INTRAVENOUS

## 2021-11-12 MED ORDER — EPHEDRINE SULFATE 50 MG/ML IJ SOLN
INTRAMUSCULAR | Status: DC | PRN
  Administered 2021-11-12: 10 mg via INTRAVENOUS

## 2021-11-12 MED ORDER — ONDANSETRON HCL 4 MG/2ML IJ SOLN
INTRAMUSCULAR | Status: DC | PRN
  Administered 2021-11-12: 4 mg via INTRAVENOUS

## 2021-11-12 MED ORDER — DEXAMETHASONE SODIUM PHOSPHATE 10 MG/ML IJ SOLN
INTRAMUSCULAR | Status: DC | PRN
  Administered 2021-11-12: 10 mg via INTRAVENOUS

## 2021-11-12 MED ORDER — HYDROMORPHONE 1 MG/ML IV SOLN
INTRAVENOUS | Status: DC
  Administered 2021-11-12 – 2021-11-13 (×3): 0.9 mg via INTRAVENOUS
  Administered 2021-11-13: 0.6 mg via INTRAVENOUS
  Filled 2021-11-12: qty 30

## 2021-11-12 MED ORDER — PHENYLEPHRINE HCL (PRESSORS) 10 MG/ML IV SOLN
INTRAVENOUS | Status: DC | PRN
  Administered 2021-11-12: 100 ug via INTRAVENOUS

## 2021-11-12 MED ORDER — FENTANYL CITRATE PF 50 MCG/ML IJ SOSY
PREFILLED_SYRINGE | INTRAMUSCULAR | Status: AC
  Administered 2021-11-12: 50 ug
  Filled 2021-11-12: qty 1

## 2021-11-12 MED ORDER — FENTANYL CITRATE PF 50 MCG/ML IJ SOSY
PREFILLED_SYRINGE | INTRAMUSCULAR | Status: AC
  Filled 2021-11-12: qty 1

## 2021-11-12 MED ORDER — LACTATED RINGERS IV SOLN: INTRAVENOUS | Status: DC | PRN

## 2021-11-12 MED ORDER — DIPHENHYDRAMINE HCL 50 MG/ML IJ SOLN: 12.5000 mg | Freq: Four times a day (QID) | INTRAMUSCULAR | Status: DC | PRN

## 2021-11-12 MED ORDER — CHLORHEXIDINE GLUCONATE 0.12 % MT SOLN
15.0000 mL | Freq: Once | OROMUCOSAL | Status: AC
  Administered 2021-11-12: 15 mL via OROMUCOSAL
  Filled 2021-11-12: qty 15

## 2021-11-12 MED ORDER — WATER FOR IRRIGATION, STERILE IR SOLN
Status: DC | PRN
  Administered 2021-11-12 (×2): 1000 mL

## 2021-11-12 MED ORDER — PROCHLORPERAZINE MALEATE 10 MG PO TABS
10.0000 mg | ORAL_TABLET | Freq: Four times a day (QID) | ORAL | Status: DC | PRN
  Filled 2021-11-12: qty 1

## 2021-11-12 MED ORDER — CHLORHEXIDINE GLUCONATE CLOTH 2 % EX PADS
6.0000 | MEDICATED_PAD | Freq: Every day | CUTANEOUS | Status: DC
  Administered 2021-11-13 – 2021-11-24 (×9): 6 via TOPICAL

## 2021-11-12 MED ORDER — CEFAZOLIN SODIUM-DEXTROSE 2-4 GM/100ML-% IV SOLN
INTRAVENOUS | Status: AC
  Filled 2021-11-12: qty 100

## 2021-11-12 MED ORDER — VALPROATE SODIUM 100 MG/ML IV SOLN: 2500.0000 mg | Freq: Every day | INTRAVENOUS | Status: DC

## 2021-11-12 MED ORDER — PHENYLEPHRINE HCL-NACL 20-0.9 MG/250ML-% IV SOLN
INTRAVENOUS | Status: DC | PRN
  Administered 2021-11-12: 30 ug/min via INTRAVENOUS

## 2021-11-12 MED ORDER — LACTATED RINGERS IV SOLN: INTRAVENOUS | Status: DC

## 2021-11-12 MED ORDER — NALOXONE HCL 0.4 MG/ML IJ SOLN
0.4000 mg | INTRAMUSCULAR | Status: DC | PRN
  Filled 2021-11-12: qty 1

## 2021-11-12 MED ORDER — LORAZEPAM 2 MG/ML IJ SOLN: 0.5000 mg | Freq: Four times a day (QID) | INTRAMUSCULAR | Status: DC | PRN

## 2021-11-12 MED ORDER — ACETAMINOPHEN 500 MG PO TABS
1000.0000 mg | ORAL_TABLET | ORAL | Status: AC
  Administered 2021-11-12: 1000 mg via ORAL
  Filled 2021-11-12: qty 2

## 2021-11-12 MED ORDER — ONDANSETRON HCL 4 MG/2ML IJ SOLN
INTRAMUSCULAR | Status: AC
  Filled 2021-11-12: qty 2

## 2021-11-12 MED ORDER — ONDANSETRON 4 MG PO TBDP: 4.0000 mg | ORAL_TABLET | Freq: Four times a day (QID) | ORAL | Status: DC | PRN

## 2021-11-12 MED ORDER — DIPHENHYDRAMINE HCL 50 MG/ML IJ SOLN
12.5000 mg | Freq: Four times a day (QID) | INTRAMUSCULAR | Status: DC | PRN
  Filled 2021-11-12: qty 0.25

## 2021-11-12 MED ORDER — ONDANSETRON HCL 4 MG/2ML IJ SOLN: 4.0000 mg | Freq: Four times a day (QID) | INTRAMUSCULAR | Status: DC | PRN

## 2021-11-12 MED ORDER — ROPIVACAINE HCL 2 MG/ML IJ SOLN
7.0000 mL/h | INTRAMUSCULAR | Status: DC
  Administered 2021-11-12: 7 mL/h via EPIDURAL
  Filled 2021-11-12 (×2): qty 200

## 2021-11-12 MED ORDER — HEPARIN SODIUM (PORCINE) 5000 UNIT/ML IJ SOLN
5000.0000 [IU] | Freq: Three times a day (TID) | INTRAMUSCULAR | Status: DC
  Administered 2021-11-13 – 2021-11-18 (×16): 5000 [IU] via SUBCUTANEOUS
  Filled 2021-11-12 (×16): qty 1

## 2021-11-12 MED ORDER — CEFAZOLIN SODIUM-DEXTROSE 2-4 GM/100ML-% IV SOLN
2.0000 g | INTRAVENOUS | Status: AC
  Administered 2021-11-12 (×2): 2 g via INTRAVENOUS
  Filled 2021-11-12: qty 100

## 2021-11-12 MED ORDER — FENTANYL CITRATE (PF) 250 MCG/5ML IJ SOLN
INTRAMUSCULAR | Status: AC
  Filled 2021-11-12: qty 5

## 2021-11-12 MED ORDER — CHLORHEXIDINE GLUCONATE CLOTH 2 % EX PADS: 6.0000 | MEDICATED_PAD | Freq: Once | CUTANEOUS | Status: DC

## 2021-11-12 MED ORDER — FENTANYL CITRATE (PF) 250 MCG/5ML IJ SOLN
INTRAMUSCULAR | Status: DC | PRN
  Administered 2021-11-12: 100 ug via INTRAVENOUS
  Administered 2021-11-12: 50 ug via INTRAVENOUS
  Administered 2021-11-12: 125 ug via INTRAVENOUS
  Administered 2021-11-12: 25 ug via INTRAVENOUS

## 2021-11-12 MED ORDER — ALBUMIN HUMAN 5 % IV SOLN: INTRAVENOUS | Status: DC | PRN

## 2021-11-12 MED ORDER — LIDOCAINE HCL 1 % IJ SOLN
INTRAMUSCULAR | Status: DC | PRN
  Administered 2021-11-12: 3 mL via INTRAMUSCULAR

## 2021-11-12 MED ORDER — ORAL CARE MOUTH RINSE: 15.0000 mL | Freq: Once | OROMUCOSAL | Status: AC

## 2021-11-12 MED ORDER — PHENOL 1.4 % MT LIQD
1.0000 | OROMUCOSAL | Status: DC | PRN
  Administered 2021-11-12 – 2021-11-14 (×2): 1 via OROMUCOSAL
  Filled 2021-11-12: qty 177

## 2021-11-12 MED ORDER — PANTOPRAZOLE SODIUM 40 MG IV SOLR
40.0000 mg | Freq: Every day | INTRAVENOUS | Status: DC
  Administered 2021-11-12 – 2021-11-15 (×4): 40 mg via INTRAVENOUS
  Filled 2021-11-12 (×4): qty 40

## 2021-11-12 MED ORDER — VISTASEAL 10 ML SINGLE DOSE KIT
PACK | CUTANEOUS | Status: DC | PRN
  Administered 2021-11-12: 10 mL via TOPICAL

## 2021-11-12 MED ORDER — ORAL CARE MOUTH RINSE
15.0000 mL | Freq: Two times a day (BID) | OROMUCOSAL | Status: DC
  Administered 2021-11-13: 15 mL via OROMUCOSAL

## 2021-11-12 MED ORDER — EPHEDRINE 5 MG/ML INJ
INTRAVENOUS | Status: AC
  Filled 2021-11-12: qty 10

## 2021-11-12 MED ORDER — KCL-LACTATED RINGERS-D5W 20 MEQ/L IV SOLN
INTRAVENOUS | Status: DC
  Filled 2021-11-12 (×16): qty 1000

## 2021-11-12 MED ORDER — LEVOTHYROXINE SODIUM 100 MCG/5ML IV SOLN
75.0000 ug | Freq: Every day | INTRAVENOUS | Status: DC
  Administered 2021-11-15 – 2021-11-16 (×2): 75 ug via INTRAVENOUS
  Filled 2021-11-12 (×2): qty 5

## 2021-11-12 MED ORDER — ROPIVACAINE HCL 2 MG/ML IJ SOLN
8.0000 mL/h | INTRAMUSCULAR | Status: DC
  Administered 2021-11-12: 8 mL/h via EPIDURAL
  Filled 2021-11-12 (×2): qty 200

## 2021-11-12 SURGICAL SUPPLY — 113 items
ADH SKN CLS APL DERMABOND .7 (GAUZE/BANDAGES/DRESSINGS) ×1
APL PRP STRL LF DISP 70% ISPRP (MISCELLANEOUS) ×1
BAG BILE T-TUBES STRL (MISCELLANEOUS) ×8 IMPLANT
BAG COUNTER SPONGE SURGICOUNT (BAG) ×3 IMPLANT
BAG DRN 9.5 2 ADJ BELT ADPR (MISCELLANEOUS) ×2
BAG SURGICOUNT SPONGE COUNTING (BAG) ×1
BIOPATCH RED 1 DISK 7.0 (GAUZE/BANDAGES/DRESSINGS) ×6 IMPLANT
BIOPATCH RED 1IN DISK 7.0MM (GAUZE/BANDAGES/DRESSINGS) ×2
BLADE CLIPPER SURG (BLADE) IMPLANT
BLADE SURG 10 STRL SS (BLADE) ×4 IMPLANT
BOOT SUTURE AID YELLOW STND (SUTURE) ×8 IMPLANT
CATH ROBINSON RED A/P 16FR (CATHETERS) IMPLANT
CHLORAPREP W/TINT 26 (MISCELLANEOUS) ×4 IMPLANT
CLIP LIGATING HEM O LOK PURPLE (MISCELLANEOUS) ×24 IMPLANT
CLIP LIGATING HEMO O LOK GREEN (MISCELLANEOUS) ×4 IMPLANT
CLIP VESOCCLUDE LG 6/CT (CLIP) ×4 IMPLANT
CLIP VESOCCLUDE MED 24/CT (CLIP) ×4 IMPLANT
CLIP VESOLOCK LG 6/CT PURPLE (CLIP) ×8 IMPLANT
CLIP VESOLOCK MED 6/CT (CLIP) ×4 IMPLANT
CNTNR URN SCR LID CUP LEK RST (MISCELLANEOUS) IMPLANT
CONT SPEC 4OZ STRL OR WHT (MISCELLANEOUS)
COUNTER NEEDLE 20 DBL MAG RED (NEEDLE) IMPLANT
COVER MAYO STAND STRL (DRAPES) ×4 IMPLANT
COVER SURGICAL LIGHT HANDLE (MISCELLANEOUS) ×4 IMPLANT
DERMABOND ADVANCED (GAUZE/BANDAGES/DRESSINGS) ×2
DERMABOND ADVANCED .7 DNX12 (GAUZE/BANDAGES/DRESSINGS) ×2 IMPLANT
DRAIN CHANNEL 19F RND (DRAIN) ×8 IMPLANT
DRAIN PENROSE 0.5X18 (DRAIN) ×4 IMPLANT
DRAPE LAPAROSCOPIC ABDOMINAL (DRAPES) ×4 IMPLANT
DRAPE WARM FLUID 44X44 (DRAPES) ×4 IMPLANT
DRSG COVADERM 4X10 (GAUZE/BANDAGES/DRESSINGS) ×4 IMPLANT
DRSG COVADERM 4X14 (GAUZE/BANDAGES/DRESSINGS) IMPLANT
DRSG COVADERM 4X6 (GAUZE/BANDAGES/DRESSINGS) IMPLANT
DRSG COVADERM 4X8 (GAUZE/BANDAGES/DRESSINGS) IMPLANT
DRSG COVADERM PLUS 2X2 (GAUZE/BANDAGES/DRESSINGS) ×4 IMPLANT
DRSG TEGADERM 4X4.75 (GAUZE/BANDAGES/DRESSINGS) ×4 IMPLANT
DRSG TELFA 3X8 NADH (GAUZE/BANDAGES/DRESSINGS) ×4 IMPLANT
ELECT BLADE 4.0 EZ CLEAN MEGAD (MISCELLANEOUS)
ELECT BLADE 6.5 EXT (BLADE) ×4 IMPLANT
ELECT CAUTERY BLADE 6.4 (BLADE) ×4 IMPLANT
ELECT REM PT RETURN 9FT ADLT (ELECTROSURGICAL) ×4
ELECTRODE BLDE 4.0 EZ CLN MEGD (MISCELLANEOUS) IMPLANT
ELECTRODE REM PT RTRN 9FT ADLT (ELECTROSURGICAL) ×2 IMPLANT
GLOVE SURG ENC MOIS LTX SZ6 (GLOVE) ×8 IMPLANT
GLOVE SURG UNDER LTX SZ6.5 (GLOVE) ×8 IMPLANT
GOWN STRL REUS W/ TWL LRG LVL3 (GOWN DISPOSABLE) ×4 IMPLANT
GOWN STRL REUS W/TWL 2XL LVL3 (GOWN DISPOSABLE) ×8 IMPLANT
GOWN STRL REUS W/TWL LRG LVL3 (GOWN DISPOSABLE) ×8
HANDLE SUCTION POOLE (INSTRUMENTS) ×2 IMPLANT
HEMOSTAT SURGICEL 2X14 (HEMOSTASIS) IMPLANT
KIT BASIN OR (CUSTOM PROCEDURE TRAY) ×4 IMPLANT
KIT MARKER MARGIN INK (KITS) ×4 IMPLANT
KIT TUBE JEJUNAL 16FR (CATHETERS) IMPLANT
KIT TURNOVER KIT B (KITS) ×4 IMPLANT
L-HOOK LAP DISP 36CM (ELECTROSURGICAL)
LHOOK LAP DISP 36CM (ELECTROSURGICAL) IMPLANT
LOOP VESSEL MAXI BLUE (MISCELLANEOUS) ×4 IMPLANT
LOOP VESSEL MINI RED (MISCELLANEOUS) ×4 IMPLANT
NEEDLE 22X1 1/2 (OR ONLY) (NEEDLE) ×4 IMPLANT
NS IRRIG 1000ML POUR BTL (IV SOLUTION) ×8 IMPLANT
PAD ARMBOARD 7.5X6 YLW CONV (MISCELLANEOUS) ×8 IMPLANT
PENCIL SMOKE EVACUATOR (MISCELLANEOUS) ×4 IMPLANT
PLUG CATH AND CAP STER (CATHETERS) IMPLANT
RELOAD PROXIMATE 75MM BLUE (ENDOMECHANICALS) ×12 IMPLANT
RELOAD PROXIMATE 75MM GREEN (ENDOMECHANICALS) IMPLANT
RETRACTOR WOUND ALXS 34CM XLRG (MISCELLANEOUS) ×2 IMPLANT
RTRCTR WOUND ALEXIS 34CM XLRG (MISCELLANEOUS) ×4
SEPRAFILM PROCEDURAL PACK 3X5 (MISCELLANEOUS) IMPLANT
SET TUBE SMOKE EVAC HIGH FLOW (TUBING) ×4 IMPLANT
SHEARS FOC LG CVD HARMONIC 17C (MISCELLANEOUS) ×4 IMPLANT
SLEEVE ENDOPATH XCEL 5M (ENDOMECHANICALS) ×4 IMPLANT
SLEEVE SUCTION CATH 165 (SLEEVE) ×4 IMPLANT
SPECIMEN JAR X LARGE (MISCELLANEOUS) ×4 IMPLANT
SPONGE INTESTINAL PEANUT (DISPOSABLE) IMPLANT
SPONGE SURGIFOAM ABS GEL 100 (HEMOSTASIS) IMPLANT
SPONGE T-LAP 18X18 ~~LOC~~+RFID (SPONGE) ×16 IMPLANT
STAPLER PROXIMATE 75MM BLUE (STAPLE) ×4 IMPLANT
STAPLER VISISTAT 35W (STAPLE) ×4 IMPLANT
SUCTION POOLE HANDLE (INSTRUMENTS) ×4
SUT 5.0 PDS RB-1 (SUTURE) ×20
SUT ETHILON 2 0 FS 18 (SUTURE) ×8 IMPLANT
SUT MNCRL AB 4-0 PS2 18 (SUTURE) IMPLANT
SUT PDS AB 1 TP1 96 (SUTURE) ×8 IMPLANT
SUT PDS AB 3-0 SH 27 (SUTURE) ×16 IMPLANT
SUT PDS AB 4-0 RB1 27 (SUTURE) ×56 IMPLANT
SUT PDS PLUS AB 5-0 RB-1 (SUTURE) ×10 IMPLANT
SUT PROLENE 3 0 SH 48 (SUTURE) ×16 IMPLANT
SUT PROLENE 4 0 RB 1 (SUTURE) ×8
SUT PROLENE 4-0 RB1 .5 CRCL 36 (SUTURE) ×4 IMPLANT
SUT PROLENE 5 0 RB 1 DA (SUTURE) IMPLANT
SUT SILK 2 0 SH CR/8 (SUTURE) ×12 IMPLANT
SUT SILK 2 0 TIES 10X30 (SUTURE) ×4 IMPLANT
SUT SILK 3 0 SH CR/8 (SUTURE) ×4 IMPLANT
SUT SILK 3 0 TIES 10X30 (SUTURE) ×4 IMPLANT
SUT VIC AB 2-0 CT1 27 (SUTURE)
SUT VIC AB 2-0 CT1 TAPERPNT 27 (SUTURE) IMPLANT
SUT VIC AB 2-0 SH 18 (SUTURE) IMPLANT
SUT VIC AB 3-0 SH 18 (SUTURE) IMPLANT
SUT VIC AB 3-0 SH 27 (SUTURE) ×4
SUT VIC AB 3-0 SH 27X BRD (SUTURE) ×2 IMPLANT
SUT VICRYL AB 2 0 TIES (SUTURE) IMPLANT
SYR BULB IRRIG 60ML STRL (SYRINGE) ×4 IMPLANT
TOWEL GREEN STERILE (TOWEL DISPOSABLE) ×4 IMPLANT
TOWEL GREEN STERILE FF (TOWEL DISPOSABLE) ×4 IMPLANT
TRAY FOLEY MTR SLVR 14FR STAT (SET/KITS/TRAYS/PACK) ×4 IMPLANT
TRAY LAPAROSCOPIC MC (CUSTOM PROCEDURE TRAY) ×4 IMPLANT
TROCAR XCEL NON-BLD 5MMX100MML (ENDOMECHANICALS) ×4 IMPLANT
TUBE CONNECTING 12'X1/4 (SUCTIONS) ×1
TUBE CONNECTING 12X1/4 (SUCTIONS) ×3 IMPLANT
TUBE FEEDING 8FR 16IN STR KANG (MISCELLANEOUS) IMPLANT
TUBE FEEDING ENTERAL 5FR 16IN (TUBING) IMPLANT
WARMER LAPAROSCOPE (MISCELLANEOUS) ×4 IMPLANT
YANKAUER SUCT BULB TIP NO VENT (SUCTIONS) ×4 IMPLANT

## 2021-11-12 NOTE — Anesthesia Procedure Notes (Addendum)
Arterial Line Insertion Performed by: Merlinda Frederick, MD, anesthesiologist  Left, radial was placed Catheter size: 20 G  Attempts: 1 Procedure performed without using ultrasound guided technique. Following insertion, dressing applied and Biopatch. Patient tolerated the procedure well with no immediate complications.

## 2021-11-12 NOTE — Anesthesia Procedure Notes (Addendum)
Epidural Patient location during procedure: OB Start time: 11/12/2021 8:15 AM End time: 11/12/2021 8:20 AM  Staffing Anesthesiologist: Merlinda Frederick, MD Performed: anesthesiologist   Preanesthetic Checklist Completed: patient identified, IV checked, site marked, risks and benefits discussed, monitors and equipment checked, pre-op evaluation and timeout performed  Epidural Patient position: sitting Prep: DuraPrep Patient monitoring: heart rate, cardiac monitor, continuous pulse ox and blood pressure Approach: midline Location: thoracic (1-12) Injection technique: LOR saline  Needle:  Needle type: Tuohy  Needle gauge: 17 G Needle length: 9 cm Needle insertion depth: 6 cm Catheter type: closed end flexible Catheter size: 20 Guage Catheter at skin depth: 11 cm Test dose: negative and Other  Assessment Events: blood not aspirated, injection not painful, no injection resistance and negative IV test  Additional Notes Informed consent obtained prior to proceeding including risk of failure, 1% risk of PDPH, risk of minor discomfort and bruising.  Discussed rare but serious complications including epidural abscess, permanent nerve injury, epidural hematoma.  Discussed alternatives to epidural analgesia and patient desires to proceed.  Timeout performed pre-procedure verifying patient name, procedure, and platelet count.  Patient tolerated procedure well.

## 2021-11-12 NOTE — Transfer of Care (Signed)
Immediate Anesthesia Transfer of Care Note  Patient: Dean [name]  Procedure(s) Performed: DIAGNOSTIC LAPAROSCOPY (Abdomen) WHIPPLE PROCEDURE (Abdomen)  Patient Location: PACU  Anesthesia Type:General  Level of Consciousness: drowsy  Airway & Oxygen Therapy: Patient Spontanous Breathing and Patient connected to nasal cannula oxygen  Post-op Assessment: Report given to RN and Post -op Vital signs reviewed and stable  Post vital signs: Reviewed and stable  Last Vitals:  Vitals Value Taken Time  BP 115/65 11/12/21 1543  Temp    Pulse 83 11/12/21 1548  Resp 20 11/12/21 1548  SpO2 100 % 11/12/21 1548  Vitals shown include unvalidated device data.  Last Pain:  Vitals:   11/12/21 0717  TempSrc:   PainSc: 0-No pain         Complications: No notable events documented.

## 2021-11-12 NOTE — Anesthesia Procedure Notes (Signed)
Procedure Name: Intubation Date/Time: 11/12/2021 9:23 AM Performed by: Eligha Bridegroom, CRNA Pre-anesthesia Checklist: Patient identified, Emergency Drugs available, Suction available, Patient being monitored and Timeout performed Patient Re-evaluated:Patient Re-evaluated prior to induction Oxygen Delivery Method: Circle system utilized Preoxygenation: Pre-oxygenation with 100% oxygen Induction Type: IV induction Ventilation: Mask ventilation without difficulty Laryngoscope Size: Mac and 4 Grade View: Grade I Tube type: Oral Tube size: 7.5 mm Number of attempts: 1 Airway Equipment and Method: Stylet Placement Confirmation: ETT inserted through vocal cords under direct vision, positive ETCO2 and breath sounds checked- equal and bilateral Secured at: 22 cm Tube secured with: Tape Dental Injury: Teeth and Oropharynx as per pre-operative assessment

## 2021-11-12 NOTE — Interval H&P Note (Signed)
History and Physical Interval Note:  11/12/2021 9:00 AM  [name]  has presented today for surgery, with the diagnosis of malignant neoplasm of bile duct.  The various methods of treatment have been discussed with the patient and family. After consideration of risks, benefits and other options for treatment, the patient has consented to  Procedure(s): LAPAROSCOPY DIAGNOSTIC (N/A) WHIPPLE PROCEDURE with possible feeding tube (N/A) Possible, INSERTION OF GASTROSTOMY TUBE (N/A) as a surgical intervention.  The patient's history has been reviewed, patient examined, no change in status, stable for surgery.  I have reviewed the patient's chart and labs.  Questions were answered to the patient's satisfaction.     Stark Klein

## 2021-11-12 NOTE — Op Note (Signed)
PREOPERATIVE DIAGNOSIS: Malignant biliary stricture  POSTOPERATIVE DIAGNOSIS: Same.   PROCEDURES PERFORMED:  Diagnostic laparoscopy  Classic pancreaticoduodenectomy   Placement of pancreatic duct stent  Omental flap  SURGEON: Stark Klein, MD   ASSISTANT: Michaelle Birks, MD   ANESTHESIA: General and epidural   FINDINGS: 3 cm pancreatic head mass. Abutted the portal vein.  Soft pancreatic tissue in pancreatic remnant. 11 mm common bile duct. 2 mm pancreatic duct  SPECIMENS:  Pancreaticoduodenectomy with gallbladder:   ESTIMATED BLOOD LOSS: 450 mL.   COMPLICATIONS: None known.   PROCEDURE:   Pt was identified in the holding area and taken to  the operating room, and placed supine on the operating room  table. General anesthesia was induced. The patient's abdomen was  prepped and draped in a sterile fashion, after a Foley catheter was  placed. A time-out was performed according to the surgical safety check  list. When all was correct we continued.   The patient was placed in reverse trendelenburg position and rotated to the right.  The left subcostal margin was anesthetized with local anesthesia.  A 5 mm optiview trocar was placed under direct visualization.  The abdomen was insufflated with carbon dioxide.  The abdomen was examined.  A second port was placed in the upper midline to be able to better visualize the right liver.  No evidence of carcinomatosis was seen.    A midline incision was made from the xiphoid to just below the umbilicus. The subcutaneous tissues were divided with the Bovie cautery. The peritoneum was entered in the center of the abdomen. Digital retraction was then used to elevate the preperitoneal fat, and this was taken with the cautery as well. Care was taken to protect the underlying viscera.   The Bookwalter self-retaining retractor was placed too assist with visualization. The right colon was taken down at the hepatic flexure. The porta was identified. The  duodenum was kocherized extensively with blunt dissection and with cautery. The gallbladder was taken off the liver with a combination of blunt dissection and cautery. The cystic duct was clipped with the Hemalock clips and suture ligated. The cystic duct was divided and the gallbladder was passed off.   The common bile duct was skeletonized near the duodenum. A vessel loop was passed around it. The gastroduodenal artery, as well as the common hepatic artery were skeletonized. The proper hepatic artery was traced out to make sure that flow was going to both sides of the liver when the GDA was clamped. The GDA was test clamped with the bulldog, with good flow to the liver and  no signs of ischemia. This was divided with 2-0 silk ties and then clipped. The proper hepatic artery was reflected upward, and the anterior portal vein was exposed.  A Kelly clamp was passed underneath the pancreas at the superior mesenteric vein, and this passed  easily with no signs of tumor involvement.   Attention was then directed to the stomach, and the omentum was taken  off of the stomach at the border of the antrum and the body. The omentum was also adherent to the right colon and was mobilized from here.  The  gastrohepatic ligament was taken down with the harmonic, and care was  taken to make sure there was not a replaced left hepatic artery in this  location. The stomach was divided with the GIA-75 stapler. The border  of the proximal stomach was oversewn with a 3-0 running PDS suture.   Attention was then directed  to the small bowel. Around 10 cm past the  ligament of Treitz was located and was then divided with the 67mm GIA.  The distal portion of the jejunum was also oversewn with a 3-0 PDS  suture. The fourth portion of the duodenum was skeletonized with the  harmonic scalpel, taking down all of the mesenteric vessels. The  ligament of Treitz was taken down. The IMV was preserved.  The duodenum was then  passed underneath the portal vein.   At this point the Claiborne Billings was replaced and the pancreas was divided with the cautery. 2-0 silk sutures were tied down and the inferior and superior border of the pancreas. The Bovie was used to coagulate the small bleeders at the border of the pancreas.  The Overholt in combination with the harmonic and locking Weck clips  were then used to take the uncinate process off of the portal vein and  the superior mesenteric artery. A Penfield dissector was used to help get the tumor off the portal/mesenteric vein.  Care was taken not to incorporate the  superior mesenteric artery in the dissection. The specimen was then marked and passed off the table for frozen section margin.   At this point the frozens returned back as all negative.  The jejunum was then passed underneath the SMV in order to get appropriate lie for the pancreatic and biliary anastomoses.  The appropriate location for the choledochojejunostomy was identified, and  the small bowel was opened approximately 10 mm. The anastamosis was created with approximately thirteen 4-0 interrupted PDS sutures.   The 2 corner sutures were placed first and then the posterior layer was done in an interrupted fashion tying on  the inside. The superior layer was then closed with interrupted sutures as  well.    The pancreatic anastomosis was then created. The pancreas was soft, and the duct was 2 mm. The lacrimal duct probe was used to locate the pancreatic duct.  A pediatric feeding tube was used as a pancreatic stent. The posterior layer was formed first with 2-0 silk sutures in interrupted fashion. A small opening was made in the jejunum at the appropriate site to line up with the pancreatic duct.   Five 5-0 PDS sutures were used to create a duct to mucosa pancreaticojejunostomy.  The anterior layer was then oversewn with 2-0 silks to dunk the pancreatic parenchyma.   The more distal portion of the jejunum was pulled up  over the colon, and two 3-0 silks were placed through the posterior border  of the stomach for the gastrojejunostomy. The stomach and the small  bowel were opened, and a GIA-75 was used to create an end-to-end  anastomosis. The open areas of the staple line were examined to ensure  that there was hemostasis. The defect was then closed with a single  layer of running Connell suture of 3-0 PDS. Prior to a complete  closure, the NG tube was passed toward the afferent limb.  The areas were then irrigated and then those anastomoses were covered  with Vistaseal. This was allowed to dry.   The abdomen was then irrigated  again and all the laparotomy sponges were removed. A lap count was  performed, which was correct. Two 19-Blake drains were placed, with the  lateral-most drain placed behind the choledochojejunostomy. The medial  Blake drain was placed just anterior and slightly superior to the  pancreaticojejunostomy. These were secured with 2-0 nylon sutures  The fascia was then closed with #1 looped running  PDS sutures. The skin was irrigated and then closed with staples. The wounds were cleaned, dried and dressed with a sterile dressing.   The patient tolerated the procedure well and was extubated and taken to  PACU in stable condition. Needle and sponge counts were correct x2.

## 2021-11-12 NOTE — Progress Notes (Signed)
Patient was moaning and in pain upon admission to 4N32.  PACU RN asked for [place] to go overide for 18mcg of IV fentanyl.  Medication was obtained from 4N pyxis and give nto patient.  This was witnessed by RN Kennieth Rad.

## 2021-11-13 LAB — GLUCOSE, CAPILLARY
Glucose-Capillary: 103 mg/dL — ABNORMAL HIGH (ref 70–99)
Glucose-Capillary: 107 mg/dL — ABNORMAL HIGH (ref 70–99)
Glucose-Capillary: 116 mg/dL — ABNORMAL HIGH (ref 70–99)
Glucose-Capillary: 120 mg/dL — ABNORMAL HIGH (ref 70–99)
Glucose-Capillary: 149 mg/dL — ABNORMAL HIGH (ref 70–99)
Glucose-Capillary: 162 mg/dL — ABNORMAL HIGH (ref 70–99)
Glucose-Capillary: 179 mg/dL — ABNORMAL HIGH (ref 70–99)

## 2021-11-13 LAB — COMPREHENSIVE METABOLIC PANEL
ALT: 86 U/L — ABNORMAL HIGH (ref 0–44)
AST: 60 U/L — ABNORMAL HIGH (ref 15–41)
Albumin: 2.9 g/dL — ABNORMAL LOW (ref 3.5–5.0)
Alkaline Phosphatase: 111 U/L (ref 38–126)
Anion gap: 6 (ref 5–15)
BUN: 12 mg/dL (ref 6–20)
CO2: 26 mmol/L (ref 22–32)
Calcium: 8.3 mg/dL — ABNORMAL LOW (ref 8.9–10.3)
Chloride: 103 mmol/L (ref 98–111)
Creatinine, Ser: 0.78 mg/dL (ref 0.61–1.24)
GFR, Estimated: >60 mL/min (ref >60)
Glucose, Bld: 169 mg/dL — ABNORMAL HIGH (ref 70–99)
Potassium: 4 mmol/L (ref 3.5–5.1)
Sodium: 135 mmol/L (ref 135–145)
Total Bilirubin: 1.9 mg/dL — ABNORMAL HIGH (ref 0.3–1.2)
Total Protein: 5.2 g/dL — ABNORMAL LOW (ref 6.5–8.1)

## 2021-11-13 LAB — CBC
HCT: 34.1 % — ABNORMAL LOW (ref 39.0–52.0)
Hemoglobin: 11.8 g/dL — ABNORMAL LOW (ref 13.0–17.0)
MCH: 36.4 pg — ABNORMAL HIGH (ref 26.0–34.0)
MCHC: 34.6 g/dL (ref 30.0–36.0)
MCV: 105.2 fL — ABNORMAL HIGH (ref 80.0–100.0)
Platelets: 163 10*3/uL (ref 150–400)
RBC: 3.24 MIL/uL — ABNORMAL LOW (ref 4.22–5.81)
RDW: 13.8 % (ref 11.5–15.5)
WBC: 10.2 10*3/uL (ref 4.0–10.5)
nRBC: 0 % (ref 0.0–0.2)

## 2021-11-13 LAB — PROTIME-INR
INR: 1.1 (ref 0.8–1.2)
Prothrombin Time: 14.2 seconds (ref 11.4–15.2)

## 2021-11-13 LAB — PHOSPHORUS: Phosphorus: 3.7 mg/dL (ref 2.5–4.6)

## 2021-11-13 LAB — MAGNESIUM: Magnesium: 1.7 mg/dL (ref 1.7–2.4)

## 2021-11-13 MED ORDER — CHLORHEXIDINE GLUCONATE 0.12 % MT SOLN
15.0000 mL | Freq: Two times a day (BID) | OROMUCOSAL | Status: DC
  Administered 2021-11-13 – 2021-11-27 (×28): 15 mL via OROMUCOSAL
  Filled 2021-11-13 (×21): qty 15

## 2021-11-13 MED ORDER — ROPIVACAINE HCL 2 MG/ML IJ SOLN
7.0000 mL/h | INTRAMUSCULAR | Status: DC
  Administered 2021-11-13 – 2021-11-14 (×2): 10 mL/h via EPIDURAL
  Administered 2021-11-15 – 2021-11-16 (×2): 7 mL/h via EPIDURAL
  Filled 2021-11-13 (×5): qty 200

## 2021-11-13 MED ORDER — HYDROMORPHONE HCL 1 MG/ML IJ SOLN
1.0000 mg | INTRAMUSCULAR | Status: DC | PRN
  Administered 2021-11-13 – 2021-11-14 (×6): 1 mg via INTRAVENOUS
  Filled 2021-11-13 (×6): qty 1

## 2021-11-13 MED ORDER — INSULIN ASPART 100 UNIT/ML IJ SOLN
0.0000 [IU] | INTRAMUSCULAR | Status: DC
  Administered 2021-11-13: 2 [IU] via SUBCUTANEOUS
  Administered 2021-11-13: 3 [IU] via SUBCUTANEOUS
  Administered 2021-11-14 – 2021-11-16 (×3): 2 [IU] via SUBCUTANEOUS

## 2021-11-13 MED ORDER — ORAL CARE MOUTH RINSE
15.0000 mL | Freq: Two times a day (BID) | OROMUCOSAL | Status: DC
  Administered 2021-11-13 – 2021-11-15 (×4): 15 mL via OROMUCOSAL

## 2021-11-13 MED ORDER — HYDROMORPHONE HCL 1 MG/ML IJ SOLN
1.0000 mg | Freq: Once | INTRAMUSCULAR | Status: AC
  Administered 2021-11-13: 1 mg via INTRAVENOUS
  Filled 2021-11-13: qty 1

## 2021-11-13 MED ORDER — HYDROMORPHONE HCL 1 MG/ML IJ SOLN
0.5000 mg | INTRAMUSCULAR | Status: DC | PRN
  Administered 2021-11-13: 0.5 mg via INTRAVENOUS
  Filled 2021-11-13: qty 1

## 2021-11-13 NOTE — Plan of Care (Signed)
  Problem: Elimination: Goal: Will not experience complications related to urinary retention Outcome: Progressing   Problem: Respiratory: Goal: Will regain and/or maintain adequate ventilation Outcome: Progressing   Patient educated on incentive spirometer via teachback. Patient demonstrates good understanding of spirometer.

## 2021-11-13 NOTE — Progress Notes (Signed)
Pt complaining indigestion pain.  RN did an EKG.  NSR and Normal EKG.

## 2021-11-13 NOTE — Progress Notes (Signed)
Contacted on call MD due to pt's elevated CBG.  Awaiting orders.

## 2021-11-13 NOTE — Anesthesia Post-op Follow-up Note (Signed)
  Anesthesia Pain Follow-up Note  Patient: Chesky Heyer Solecki  Day #: 1  Date of Follow-up: 11/13/2021 Time: 1:53 PM  Last Vitals:  Vitals:   11/13/21 1200 11/13/21 1300  BP: 127/76 138/78  Pulse: 76 92  Resp: 16 18  Temp: 36.8 C   SpO2: 100% 100%    Level of Consciousness: alert  Pain: severe   Side Effects:None  Catheter Site Exam:clean  Anti-Coag Meds (From admission, onward)   Start     Dose/Rate Route Frequency Ordered Stop   11/13/21 0600  heparin injection 5,000 Units        5,000 Units Subcutaneous Every 8 hours 11/12/21 1713      Epidural / Intrathecal (From admission, onward)   Start     Dose/Rate Route Frequency Ordered Stop   11/12/21 1630  ropivacaine (PF) 2 mg/mL (0.2%) (NAROPIN) injection        7 mL/hr 7 mL/hr  Epidural Continuous 11/12/21 1615         Plan: PCA discontinued with patient reporting increased peri-umbilical pain. No epidural associated side effects with retained motor and sensation. Catheter area c/d/i. Will increase rate to 62ml/hr from 7cc/hr to improve analgesia.   [place]

## 2021-11-13 NOTE — Plan of Care (Addendum)
  Problem: Pain Managment: Goal: General experience of comfort will improve Outcome: Not Progressing    Patient c/o 10/10 "sharp" abdominal pain at rest. PCA pump discontinued this morning. Epidural infusion rate increased from 7 to 10cc/hr after speaking with anesthesia this afternoon,  however patient still in pain and unable to attempt ambulation/pivot to recliner. Dr. Grandville Silos aware, orders noted. Patient educated with good understanding verbalized.   5:21 PM Dilaudid 0.5mg  given with no relief per patient. PRN Robaxin infusing as well. Dr. Grandville Silos aware, orders obtained.

## 2021-11-13 NOTE — Progress Notes (Signed)
Dean [name] [id number] Dec 08, 1965  CARE TEAM:  PCP: Maude Leriche, PA-C  Outpatient Care Team: Patient Care Team: Scifres, Durel Salts as PCP - General (Physician Assistant)  Inpatient Treatment Team: Treatment Team: Attending Provider: Stark Klein, MD; Utilization Review: Claudie Leach, RN; Technician: Lamar Laundry, NT; Registered Nurse: Zollie Pee, RN; Case Manager: Bartholomew Crews, RN; Social Worker: Alfredia Ferguson, LCSW   Problem List:   Principal Problem:   Hx of malignant neoplasm of bile duct Active Problems:   Malignant biliary obstruction ([place])   1 Day Post-Op  11/12/2021  PREOPERATIVE DIAGNOSIS: Malignant biliary stricture   POSTOPERATIVE DIAGNOSIS: Same.    PROCEDURES PERFORMED:  Diagnostic laparoscopy  Classic pancreaticoduodenectomy   Placement of pancreatic duct stent  Omental flap   SURGEON: Stark Klein, MD    ASSISTANT: Michaelle Birks, MD     Assessment  Doing relatively well only 1 day out  [facility] Stay = 1 days)  Plan:  Nasogastric tube for ileus.  Internal/external drainage.  Pain control with epidural, PCA, etc.  Continue Foley catheter.  Possibly remove postop day 2 per protocol.  Try to mobilize.  Follow-up on pathology.  -VTE prophylaxis- SCDs, etc -mobilize as tolerated to help recovery  Disposition:  Disposition:  The patient is from: Home  Anticipate discharge to:  Home with Home Health  Anticipated Date of Discharge is:  December 16,2022    Barriers to discharge:  Pending Clinical improvement (more likely than not)  Patient currently is NOT MEDICALLY STABLE for discharge from the hospital from a surgery standpoint.      25 minutes spent in review, evaluation, examination, counseling, and coordination of care.   I have reviewed this patient's available data, including medical history, events of note, physical examination and test results as part of my evaluation.  A  significant portion of that time was spent in counseling.  Care during the described time interval was provided by me.  11/13/2021    Subjective: (Chief complaint)  Patient very appreciative care but obviously very sore.  Objective:  Vital signs:  Vitals:   11/13/21 0800 11/13/21 0810 11/13/21 0900 11/13/21 1000  BP: 130/73  128/77 133/76  Pulse: 72  81 72  Resp: 12 16 18 13   Temp: 98.5 F (36.9 C)     TempSrc: Oral     SpO2: 100%  100% 100%  Weight:      Height:        Last BM Date:  (pta)  Intake/Output   Yesterday:  12/09 0701 - 12/10 0700 In: 4248.4 [I.V.:3267.5; IV Piggyback:980.8] Out: 1995 [Urine:1385; Drains:160; Blood:450] This shift:  Total I/O In: -  Out: 360 [Urine:360]  Bowel function:  Flatus: No  BM:  No  Drain: Serosanguinous   Physical Exam:  General: Pt awake/alert in mild acute distress Eyes: PERRL, normal EOM.  Sclera clear.  No icterus Neuro: CN II-XII intact w/o focal sensory/motor deficits. Lymph: No head/neck/groin lymphadenopathy Psych:  No delerium/psychosis/paranoia.  Oriented x 4 HENT: Normocephalic, Mucus membranes moist.  No thrush Neck: Supple, No tracheal deviation.  No obvious thyromegaly Chest: No pain to chest wall compression.  Good respiratory excursion.  No audible wheezing CV:  Pulses intact.  Regular rhythm.  No major extremity edema MS: Normal AROM mjr joints.  No obvious deformity  Abdomen: Soft.  Mildy distended.  Mildly tender at incisions only.  No evidence of peritonitis.  No incarcerated hernias.  Ext:   No deformity.  No mjr edema.  No cyanosis Skin: No petechiae / purpurea.  No major sores.  Warm and dry    Results:   Cultures: Recent Results (from the past 720 hour(s))  SARS CORONAVIRUS 2 (TAT 6-24 HRS) Nasopharyngeal Nasopharyngeal Swab     Status: None   Collection Time: 11/09/21  1:40 PM   Specimen: Nasopharyngeal Swab  Result Value Ref Range Status   SARS Coronavirus 2 NEGATIVE NEGATIVE  Final    Comment: (NOTE) SARS-CoV-2 target nucleic acids are NOT DETECTED.  The SARS-CoV-2 RNA is generally detectable in upper and lower respiratory specimens during the acute phase of infection. Negative results do not preclude SARS-CoV-2 infection, do not rule out co-infections with other pathogens, and should not be used as the sole basis for treatment or other patient management decisions. Negative results must be combined with clinical observations, patient history, and epidemiological information. The expected result is Negative.  Fact Sheet for Patients: SugarRoll.be  Fact Sheet for Healthcare Providers: https://www.woods-mathews.com/  This test is not yet approved or cleared by the [place] FDA and  has been authorized for detection and/or diagnosis of SARS-CoV-2 by FDA under an Emergency Use Authorization (EUA). This EUA will remain  in effect (meaning this test can be used) for the duration of the COVID-19 declaration under Se ction 564(b)(1) of the Act, 21 U.S.C. section 360bbb-3(b)(1), unless the authorization is terminated or revoked sooner.  Performed at [facility], [place] [address] [place], [place] [address]   MRSA Next Gen by PCR, Nasal     Status: None   Collection Time: 11/12/21  6:50 PM   Specimen: Nasal Mucosa; Nasal Swab  Result Value Ref Range Status   MRSA by PCR Next Gen NOT DETECTED NOT DETECTED Final    Comment: (NOTE) The GeneXpert MRSA Assay (FDA approved for NASAL specimens only), is one component of a comprehensive MRSA colonization surveillance program. It is not intended to diagnose MRSA infection nor to guide or monitor treatment for MRSA infections. Test performance is not FDA approved in patients less than 33 years old. Performed at [facility], [place] [address], [place], [place] [address]     Labs: Results for orders placed or performed during the hospital encounter of 11/12/21  (from the past 48 hour(s))  ABO/Rh     Status: None   Collection Time: 11/12/21  7:30 AM  Result Value Ref Range   ABO/RH(D)      O POS Performed at [facility] [address] [place], [place] [address]   Prepare RBC (crossmatch)     Status: None   Collection Time: 11/12/21 11:29 AM  Result Value Ref Range   Order Confirmation      BB SAMPLE OR UNITS ALREADY AVAILABLE Performed at [facility] [address], [place], [place] [address]   I-STAT 7, (LYTES, BLD GAS, ICA, H+H)     Status: Abnormal   Collection Time: 11/12/21  2:51 PM  Result Value Ref Range   pH, Arterial 7.357 7.350 - 7.450   pCO2 arterial 40.4 32.0 - 48.0 mmHg   pO2, Arterial 266 (H) 83.0 - 108.0 mmHg   Bicarbonate 22.4 20.0 - 28.0 mmol/L   TCO2 24 22 - 32 mmol/L   O2 Saturation 100.0 %   Acid-base deficit 3.0 (H) 0.0 - 2.0 mmol/L   Sodium 139 135 - 145 mmol/L   Potassium 3.6 3.5 - 5.1 mmol/L   Calcium, Ion 1.16 1.15 - 1.40 mmol/L   HCT  30.0 (L) 39.0 - 52.0 %   Hemoglobin 10.2 (L) 13.0 - 17.0 g/dL   Patient temperature 38.0 C    Sample type ARTERIAL   CBC     Status: Abnormal   Collection Time: 11/12/21  6:50 PM  Result Value Ref Range   WBC 11.0 (H) 4.0 - 10.5 K/uL   RBC 3.30 (L) 4.22 - 5.81 MIL/uL   Hemoglobin 11.8 (L) 13.0 - 17.0 g/dL   HCT 34.6 (L) 39.0 - 52.0 %   MCV 104.8 (H) 80.0 - 100.0 fL   MCH 35.8 (H) 26.0 - 34.0 pg   MCHC 34.1 30.0 - 36.0 g/dL   RDW 13.8 11.5 - 15.5 %   Platelets 171 150 - 400 K/uL   nRBC 0.0 0.0 - 0.2 %    Comment: Performed at [facility], [place] [address], [place], [place] [address]  Creatinine, serum     Status: None   Collection Time: 11/12/21  6:50 PM  Result Value Ref Range   Creatinine, Ser 1.01 0.61 - 1.24 mg/dL   GFR, Estimated >60 >60 mL/min    Comment: (NOTE) Calculated using the CKD-EPI Creatinine Equation (2021) Performed at [facility] [address] [place],  [address]   MRSA Next Gen by PCR, Nasal     Status: None    Collection Time: 11/12/21  6:50 PM   Specimen: Nasal Mucosa; Nasal Swab  Result Value Ref Range   MRSA by PCR Next Gen NOT DETECTED NOT DETECTED    Comment: (NOTE) The GeneXpert MRSA Assay (FDA approved for NASAL specimens only), is one component of a comprehensive MRSA colonization surveillance program. It is not intended to diagnose MRSA infection nor to guide or monitor treatment for MRSA infections. Test performance is not FDA approved in patients less than 40 years old. Performed at [facility], [place] [address], [place], [place] [address]   Glucose, capillary     Status: Abnormal   Collection Time: 11/13/21 12:56 AM  Result Value Ref Range   Glucose-Capillary 179 (H) 70 - 99 mg/dL    Comment: Glucose reference range applies only to samples taken after fasting for at least 8 hours.  Glucose, capillary     Status: Abnormal   Collection Time: 11/13/21  3:55 AM  Result Value Ref Range   Glucose-Capillary 149 (H) 70 - 99 mg/dL    Comment: Glucose reference range applies only to samples taken after fasting for at least 8 hours.   Comment 1 Notify RN    Comment 2 Document in Chart   Comprehensive metabolic panel     Status: Abnormal   Collection Time: 11/13/21  5:30 AM  Result Value Ref Range   Sodium 135 135 - 145 mmol/L   Potassium 4.0 3.5 - 5.1 mmol/L   Chloride 103 98 - 111 mmol/L   CO2 26 22 - 32 mmol/L   Glucose, Bld 169 (H) 70 - 99 mg/dL    Comment: Glucose reference range applies only to samples taken after fasting for at least 8 hours.   BUN 12 6 - 20 mg/dL   Creatinine, Ser 0.78 0.61 - 1.24 mg/dL   Calcium 8.3 (L) 8.9 - 10.3 mg/dL   Total Protein 5.2 (L) 6.5 - 8.1 g/dL   Albumin 2.9 (L) 3.5 - 5.0 g/dL   AST 60 (H) 15 - 41 U/L   ALT 86 (H) 0 - 44 U/L   Alkaline Phosphatase 111 38 - 126 U/L  Total Bilirubin 1.9 (H) 0.3 - 1.2 mg/dL   GFR, Estimated >60 >60 mL/min    Comment: (NOTE) Calculated using the CKD-EPI Creatinine Equation (2021)    Anion gap 6 5 -  15    Comment: Performed at [facility] [address], [place], [place] [address]  Magnesium     Status: None   Collection Time: 11/13/21  5:30 AM  Result Value Ref Range   Magnesium 1.7 1.7 - 2.4 mg/dL    Comment: Performed at [facility] [address] [place], [place] [address]  Phosphorus     Status: None   Collection Time: 11/13/21  5:30 AM  Result Value Ref Range   Phosphorus 3.7 2.5 - 4.6 mg/dL    Comment: Performed at [facility] [address], [place], [place] [address]  CBC     Status: Abnormal   Collection Time: 11/13/21  5:30 AM  Result Value Ref Range   WBC 10.2 4.0 - 10.5 K/uL   RBC 3.24 (L) 4.22 - 5.81 MIL/uL   Hemoglobin 11.8 (L) 13.0 - 17.0 g/dL   HCT 34.1 (L) 39.0 - 52.0 %   MCV 105.2 (H) 80.0 - 100.0 fL   MCH 36.4 (H) 26.0 - 34.0 pg   MCHC 34.6 30.0 - 36.0 g/dL   RDW 13.8 11.5 - 15.5 %   Platelets 163 150 - 400 K/uL   nRBC 0.0 0.0 - 0.2 %    Comment: Performed at [facility], [place] [address] [place], [place] [address]  Protime-INR     Status: None   Collection Time: 11/13/21  5:30 AM  Result Value Ref Range   Prothrombin Time 14.2 11.4 - 15.2 seconds   INR 1.1 0.8 - 1.2    Comment: (NOTE) INR goal varies based on device and disease states. Performed at [facility], [place] [address] [place], [place] [address]   Glucose, capillary     Status: Abnormal   Collection Time: 11/13/21  8:26 AM  Result Value Ref Range   Glucose-Capillary 162 (H) 70 - 99 mg/dL    Comment: Glucose reference range applies only to samples taken after fasting for at least 8 hours.    Imaging / Studies: No results found.  Medications / Allergies: per chart  Antibiotics: Anti-infectives (From admission, onward)    Start     Dose/Rate Route Frequency Ordered Stop   11/12/21 1900  ceFAZolin (ANCEF) IVPB 2g/100 mL premix        2 g 200 mL/hr over 30 Minutes Intravenous Every 8 hours 11/12/21 1713 11/12/21 2013   11/12/21 0700  ceFAZolin  (ANCEF) IVPB 2g/100 mL premix        2 g 200 mL/hr over 30 Minutes Intravenous On call to O.R. 11/12/21 0653 11/12/21 1330         Note: Portions of this report may have been transcribed using voice recognition software. Every effort was made to ensure accuracy; however, inadvertent computerized transcription errors may be present.   Any transcriptional errors that result from this process are unintentional.    Adin Hector, MD, FACS, MASCRS Esophageal, Gastrointestinal & Colorectal Surgery Robotic and Minimally Invasive Surgery  [facility], [place]  [place] [place], [place] [address] [phone] Fax [phone] Main  CONTACT INFORMATION:  Weekday (9AM-5PM): Call [facility] main office at [phone]  Weeknight (5PM-9AM) or Weekend/Holiday: Check [email or web address] (password "  [email or web address]") for General Surgery [facility] coverage  (Please, do not use SecureChat as it is not reliable communication to operating surgeons for immediate patient care)      11/13/2021  11:14 AM

## 2021-11-14 LAB — CBC
HCT: 33.9 % — ABNORMAL LOW (ref 39.0–52.0)
Hemoglobin: 11.7 g/dL — ABNORMAL LOW (ref 13.0–17.0)
MCH: 36.9 pg — ABNORMAL HIGH (ref 26.0–34.0)
MCHC: 34.5 g/dL (ref 30.0–36.0)
MCV: 106.9 fL — ABNORMAL HIGH (ref 80.0–100.0)
Platelets: 160 10*3/uL (ref 150–400)
RBC: 3.17 MIL/uL — ABNORMAL LOW (ref 4.22–5.81)
RDW: 13.6 % (ref 11.5–15.5)
WBC: 12.9 10*3/uL — ABNORMAL HIGH (ref 4.0–10.5)
nRBC: 0 % (ref 0.0–0.2)

## 2021-11-14 LAB — GLUCOSE, CAPILLARY
Glucose-Capillary: 125 mg/dL — ABNORMAL HIGH (ref 70–99)
Glucose-Capillary: 111 mg/dL — ABNORMAL HIGH (ref 70–99)
Glucose-Capillary: 86 mg/dL (ref 70–99)
Glucose-Capillary: 90 mg/dL (ref 70–99)
Glucose-Capillary: 84 mg/dL (ref 70–99)

## 2021-11-14 LAB — COMPREHENSIVE METABOLIC PANEL
ALT: 67 U/L — ABNORMAL HIGH (ref 0–44)
AST: 37 U/L (ref 15–41)
Albumin: 2.8 g/dL — ABNORMAL LOW (ref 3.5–5.0)
Alkaline Phosphatase: 106 U/L (ref 38–126)
Anion gap: 9 (ref 5–15)
BUN: 10 mg/dL (ref 6–20)
CO2: 25 mmol/L (ref 22–32)
Calcium: 8.9 mg/dL (ref 8.9–10.3)
Chloride: 102 mmol/L (ref 98–111)
Creatinine, Ser: 0.84 mg/dL (ref 0.61–1.24)
GFR, Estimated: >60 mL/min (ref >60)
Glucose, Bld: 115 mg/dL — ABNORMAL HIGH (ref 70–99)
Potassium: 4.3 mmol/L (ref 3.5–5.1)
Sodium: 136 mmol/L (ref 135–145)
Total Bilirubin: 2.1 mg/dL — ABNORMAL HIGH (ref 0.3–1.2)
Total Protein: 5.8 g/dL — ABNORMAL LOW (ref 6.5–8.1)

## 2021-11-14 LAB — PHOSPHORUS: Phosphorus: 3 mg/dL (ref 2.5–4.6)

## 2021-11-14 LAB — PROTIME-INR
INR: 1.1 (ref 0.8–1.2)
Prothrombin Time: 14 seconds (ref 11.4–15.2)

## 2021-11-14 LAB — MAGNESIUM: Magnesium: 1.8 mg/dL (ref 1.7–2.4)

## 2021-11-14 MED ORDER — SODIUM CHLORIDE 0.9 % IV BOLUS
500.0000 mL | Freq: Once | INTRAVENOUS | Status: AC
Start: 2021-11-14 — End: 2021-11-14
  Administered 2021-11-14: 500 mL via INTRAVENOUS

## 2021-11-14 MED ORDER — SODIUM CHLORIDE 0.9% FLUSH: 9.0000 mL | INTRAVENOUS | Status: DC | PRN

## 2021-11-14 MED ORDER — NALOXONE HCL 0.4 MG/ML IJ SOLN: 0.4000 mg | INTRAMUSCULAR | Status: DC | PRN

## 2021-11-14 MED ORDER — HYDROMORPHONE 1 MG/ML IV SOLN
INTRAVENOUS | Status: DC
  Administered 2021-11-14: 1.5 mg via INTRAVENOUS
  Administered 2021-11-14: 1 mg via INTRAVENOUS
  Administered 2021-11-14: 0.3 mg via INTRAVENOUS
  Administered 2021-11-14: 1.2 mg via INTRAVENOUS
  Administered 2021-11-15: 0.9 mg via INTRAVENOUS
  Administered 2021-11-15: 0.3 mg via INTRAVENOUS
  Administered 2021-11-15: 1.2 mg via INTRAVENOUS
  Administered 2021-11-15: 1.8 mg via INTRAVENOUS
  Administered 2021-11-15: 3.3 mg via INTRAVENOUS
  Filled 2021-11-14 (×2): qty 30

## 2021-11-14 MED ORDER — DIPHENHYDRAMINE HCL 50 MG/ML IJ SOLN: 12.5000 mg | Freq: Four times a day (QID) | INTRAMUSCULAR | Status: DC | PRN

## 2021-11-14 MED ORDER — METHOCARBAMOL 1000 MG/10ML IJ SOLN
1000.0000 mg | Freq: Three times a day (TID) | INTRAVENOUS | Status: DC
  Administered 2021-11-14 – 2021-11-17 (×8): 1000 mg via INTRAVENOUS
  Filled 2021-11-14 (×4): qty 10
  Filled 2021-11-14: qty 1000
  Filled 2021-11-14 (×5): qty 10

## 2021-11-14 MED ORDER — ONDANSETRON HCL 4 MG/2ML IJ SOLN: 4.0000 mg | Freq: Four times a day (QID) | INTRAMUSCULAR | Status: DC | PRN

## 2021-11-14 MED ORDER — HYDROMORPHONE HCL 1 MG/ML IJ SOLN
1.0000 mg | INTRAMUSCULAR | Status: DC | PRN
  Administered 2021-11-14: 1 mg via INTRAVENOUS
  Filled 2021-11-14: qty 1

## 2021-11-14 MED ORDER — DIPHENHYDRAMINE HCL 12.5 MG/5ML PO ELIX
12.5000 mg | ORAL_SOLUTION | Freq: Four times a day (QID) | ORAL | Status: DC | PRN
  Filled 2021-11-14: qty 5

## 2021-11-14 NOTE — Anesthesia Post-op Follow-up Note (Signed)
  Anesthesia Pain Follow-up Note  Patient: Dean [name]  Day #: 2  Date of Follow-up: 11/14/2021 Time: 10:04 AM  Last Vitals:  Vitals:   11/14/21 0800 11/14/21 0935  BP: (!) 151/90   Pulse: 87   Resp: 19 19  Temp: 37.3 C   SpO2: 100%     Level of Consciousness: alert  Pain: moderate   Side Effects:None  Catheter Site Exam:clean, dry, no drainage  Anti-Coag Meds (From admission, onward)   Start     Dose/Rate Route Frequency Ordered Stop   11/13/21 0600  heparin injection 5,000 Units        5,000 Units Subcutaneous Every 8 hours 11/12/21 1713      Epidural / Intrathecal (From admission, onward)   Start     Dose/Rate Route Frequency Ordered Stop   11/13/21 1500  ropivacaine (PF) 2 mg/mL (0.2%) (NAROPIN) injection        10 mL/hr 10 mL/hr  Epidural Continuous 11/13/21 1413         Plan: Continue current therapy of postop epidural at surgeon's request. The patient had the epidural rate increased from 7cc/hr to 10cc/hr yesterday. The epidural is on a CADD pump and the level of local anesthetic in the bottle has not changed overnight. The patient has no level of anesthesia to cold presently. I have removed the CADD pump and switched to an Alaris pump. I bolused the catheter with 10cc of 0.25% Bupivicaine and continued the infusion at 10cc/hr on the Alaris pump.  Eshaan Titzer,W. EDMOND

## 2021-11-14 NOTE — Evaluation (Signed)
Physical Therapy Evaluation Patient Details Name: Dean [name] MRN: [id number] DOB: 1966/06/26 Today's Date: 11/14/2021  History of Present Illness  Patient is a 55 y/o male who presents s/p whipple procedure 11/12/21 secondary to Malignant biliary obstruction. PMH includes bipolar disorder.  Clinical Impression  Patient presents with pain, deconditioning, and impaired mobility s/p above. 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 Pt plans to stay with his son and daughter in law at d/c as daughter in law is able to provide support as needed. Today, pt tolerated bed mobility, transfers and short distance ambulation with Min A and use of RW for support. Education re: log roll technique, mobility expectations, IS etc. Pt not able to get fully upright due to tightness/soreness in abdomen. Pt will likely progress well with mobility. Recommend use of RW for stability at this time. Pt agreeable. Will follow acutely to maximize independence and mobility prior to return home.       Recommendations for follow up therapy are one component of a multi-disciplinary discharge planning process, led by the attending physician.  Recommendations may be updated based on patient status, additional functional criteria and insurance authorization.  Follow Up Recommendations No PT follow up    Assistance Recommended at Discharge Intermittent Supervision/Assistance  Functional Status Assessment Patient has had a recent decline in their functional status and demonstrates the ability to make significant improvements in function in a reasonable and predictable amount of time.  Equipment Recommendations  Rolling walker (2 wheels) (pending improvement)    Recommendations for Other Services       Precautions / Restrictions Precautions Precautions: Other (comment);Fall Precaution Comments: 2 drains, PCA, epidural Restrictions Weight Bearing Restrictions: No      Mobility  Bed Mobility Overal bed  mobility: Needs Assistance Bed Mobility: Rolling;Sidelying to Sit Rolling: Min guard Sidelying to sit: Min assist;HOB elevated       General bed mobility comments: Cues for log roll technique and use of rail for support; Min A to elevate trunk, guarded movement.    Transfers Overall transfer level: Needs assistance Equipment used: Rolling walker (2 wheels) Transfers: Sit to/from Stand Sit to Stand: Min assist;From elevated surface           General transfer comment: Min A to power to standing with cues for hand placement/technique, not able to get fully upright    Ambulation/Gait Ambulation/Gait assistance: Min guard;+2 safety/equipment Gait Distance (Feet): 4 Feet Assistive device: Rolling walker (2 wheels) Gait Pattern/deviations: Trunk flexed Gait velocity: decreased Gait velocity interpretation: <1.31 ft/sec, indicative of household ambulator   General Gait Details: Able to take a few steps to get to chair with assist for line management, Min guard for safety and help with RW management.  Stairs            Wheelchair Mobility    Modified Rankin (Stroke Patients Only)       Balance Overall balance assessment: Needs assistance Sitting-balance support: Feet supported;Bilateral upper extremity supported Sitting balance-Leahy Scale: Fair     Standing balance support: During functional activity;Reliant on assistive device for balance Standing balance-Leahy Scale: Poor                               Pertinent Vitals/Pain Pain Assessment: Faces Faces Pain Scale: Hurts a little bit Pain Location: abdomen Pain Descriptors / Indicators: Guarding;Sore Pain Intervention(s): Monitored during session;PCA encouraged;Repositioned    Home Living Family/patient expects to be discharged to::  Private residence Living Arrangements: Children;Spouse/significant other;Other relatives;Alone (going to stay with son and daughter in law) Available Help at  Discharge: Family;Available 24 hours/day Type of Home: House Home Access: Stairs to enter Entrance Stairs-Rails: None Entrance Stairs-Number of Steps: 3   Home Layout: One level Home Equipment: Shower seat;Wheelchair - manual      Prior Function Prior Level of Function : Independent/Modified Independent             Mobility Comments: Independent, works as Librarian, academic. ADLs Comments: independent     Hand Dominance   Dominant Hand: Right    Extremity/Trunk Assessment   Upper Extremity Assessment Upper Extremity Assessment: Defer to OT evaluation    Lower Extremity Assessment Lower Extremity Assessment: Generalized weakness (but functional)    Cervical / Trunk Assessment Cervical / Trunk Assessment: Normal  Communication   Communication: No difficulties  Cognition Arousal/Alertness: Awake/alert Behavior During Therapy: WFL for tasks assessed/performed Overall Cognitive Status: Within Functional Limits for tasks assessed                                          General Comments General comments (skin integrity, edema, etc.): Wife present during session. HR up to 117 bpm with activity.    Exercises     Assessment/Plan    PT Assessment Patient needs continued PT services  PT Problem List Decreased strength;Decreased mobility;Pain;Decreased knowledge of use of DME;Decreased balance;Cardiopulmonary status limiting activity;Decreased skin integrity;Decreased knowledge of precautions       PT Treatment Interventions Therapeutic activities;Gait training;DME instruction;Therapeutic exercise;Patient/family education;Stair training;Balance training;Functional mobility training    PT Goals (Current goals can be found in the Care Plan section)  Acute Rehab PT Goals Patient Stated Goal: to return to independence PT Goal Formulation: With patient Time For Goal Achievement: 11/28/21 Potential to Achieve Goals: Good    Frequency Min 3X/week   Barriers  to discharge        Co-evaluation               AM-PAC PT "6 Clicks" Mobility  Outcome Measure Help needed turning from your back to your side while in a flat bed without using bedrails?: A Little Help needed moving from lying on your back to sitting on the side of a flat bed without using bedrails?: A Little Help needed moving to and from a bed to a chair (including a wheelchair)?: A Little Help needed standing up from a chair using your arms (e.g., wheelchair or bedside chair)?: A Little Help needed to walk in hospital room?: A Little Help needed climbing 3-5 steps with a railing? : A Little 6 Click Score: 18    End of Session Equipment Utilized During Treatment: Oxygen Activity Tolerance: Patient tolerated treatment well Patient left: in chair;with call bell/phone within reach;with family/visitor present;with nursing/sitter in room Nurse Communication: Mobility status PT Visit Diagnosis: Difficulty in walking, not elsewhere classified (R26.2);Muscle weakness (generalized) (M62.81);Pain Pain - part of body:  (abdomen)    Time: 4010-2725 PT Time Calculation (min) (ACUTE ONLY): 25 min   Charges:   PT Evaluation $PT Eval Moderate Complexity: 1 Mod PT Treatments $Therapeutic Activity: 8-22 mins        Marisa Severin, PT, DPT Acute Rehabilitation Services Pager [phone] Office [phone]     Marguarite Arbour A Sabra Heck 11/14/2021, 3:30 PM

## 2021-11-14 NOTE — Progress Notes (Addendum)
Dean [name] [id number] 1966-08-28  CARE TEAM:  PCP: Maude Leriche, PA-C  Outpatient Care Team: Patient Care Team: Scifres, Durel Salts as PCP - General (Physician Assistant)  Inpatient Treatment Team: Treatment Team: Attending Provider: Stark Klein, MD; Registered Nurse: Loretha Stapler, RN; Physical Therapist: Lacie Draft, PT; Registered Nurse: Zollie Pee, RN; Utilization Review: Claudie Leach, RN; Case Manager: Bartholomew Crews, RN; Technician: Lamar Laundry, NT; Occupational Therapist: Apickup-Ot, A, OT   Problem List:   Principal Problem:   Hx of malignant neoplasm of bile duct Active Problems:   Malignant biliary obstruction ([place])   2 Days Post-Op  11/12/2021  PREOPERATIVE DIAGNOSIS: Malignant biliary stricture   POSTOPERATIVE DIAGNOSIS: Same.    PROCEDURES PERFORMED:  Diagnostic laparoscopy  Classic pancreaticoduodenectomy   Placement of pancreatic duct stent  Omental flap   SURGEON: Stark Klein, MD    ASSISTANT: Michaelle Birks, MD     Assessment  Doing relatively well only 1 day out  [facility] Stay = 2 days)  Plan:  Nasogastric tube for ileus.  Internal/external drainage.  Since NG tube with very low output and no distention, will start low volume clears with clamping trial.  Low threshold to turn back to suction as needed.  Pain control with epidural, PCA, etc. challenge with trying to troubleshoot epidural but not working well.  Anesthesia to reevaluate.  May be just need to remove.  PCA somehow fell off - I restarted and changed interval.  PRN Dilaudid boluses to catch up.  Give scheduled Robaxin as well.  Continue Foley catheter for now.  Suspect if epidural comes out and patient mobilizing better, can remove tomorrow.  Mild elevated bilirubin.  Not toxic.  Continue to follow.  Try to mobilize.  RN and care team on board with more aggressive mobilization.  PT added as well for help/home needs  Follow-up  on pathology.  -VTE prophylaxis- SCDs, etc  -mobilize as tolerated to help recovery  Disposition:  Disposition:  The patient is from: Home  Anticipate discharge to:  Home with Home Health  Anticipated Date of Discharge is:  December 16,2022    Barriers to discharge:  Pending Clinical improvement (more likely than not)  Patient currently is NOT MEDICALLY STABLE for discharge from the hospital from a surgery standpoint.      25 minutes spent in review, evaluation, examination, counseling, and coordination of care.   I have reviewed this patient's available data, including medical history, events of note, physical examination and test results as part of my evaluation.  A significant portion of that time was spent in counseling.  Care during the described time interval was provided by me.  11/14/2021    Subjective: (Chief complaint)  Patient very appreciative care but obviously very sore.  Nursing and anesthesia working trying to get epidural work better.  PCA already seen for some reason.  Still on standing pain meds.    Objective:  Vital signs:  Vitals:   11/14/21 0500 11/14/21 0600 11/14/21 0700 11/14/21 0800  BP: 128/82 138/85 (!) 130/91 (!) 151/90  Pulse: 98 89 91 87  Resp: 11 16 12 19   Temp:    99.1 F (37.3 C)  TempSrc:    Axillary  SpO2: 98% 100% 99% 100%  Weight:      Height:        Last BM Date:  (pta)  Intake/Output   Yesterday:  12/10 0701 - 12/11 0700 In: 1992.4 [I.V.:1502.3; IV Piggyback:362.8] Out: 1180 [Urine:1045; Drains:135]  This shift:  No intake/output data recorded.  Bowel function:  Flatus: No  BM:  No  Drain: Serosanguinous NGT clogged - I flushed & unclogged   Physical Exam:  General: Pt awake/alert in no acute distress,  Sitting up in bed Eyes: PERRL, normal EOM.  Sclera clear.  No icterus Neuro: CN II-XII intact w/o focal sensory/motor deficits. Lymph: No head/neck/groin lymphadenopathy Psych:  No  delerium/psychosis/paranoia.  Oriented x 4 HENT: Normocephalic, Mucus membranes moist.  No thrush Neck: Supple, No tracheal deviation.  No obvious thyromegaly Chest: No pain to chest wall compression.  Good respiratory excursion.  No audible wheezing CV:  Pulses intact.  Regular rhythm.  No major extremity edema MS: Normal AROM mjr joints.  No obvious deformity  Abdomen: Soft.  Mildy distended.  Mildly tender at incisions only.  No evidence of peritonitis.  No incarcerated hernias.  Ext:   No deformity.  No mjr edema.  No cyanosis Skin: No petechiae / purpurea.  No major sores.  Warm and dry    Results:   Cultures: Recent Results (from the past 720 hour(s))  SARS CORONAVIRUS 2 (TAT 6-24 HRS) Nasopharyngeal Nasopharyngeal Swab     Status: None   Collection Time: 11/09/21  1:40 PM   Specimen: Nasopharyngeal Swab  Result Value Ref Range Status   SARS Coronavirus 2 NEGATIVE NEGATIVE Final    Comment: (NOTE) SARS-CoV-2 target nucleic acids are NOT DETECTED.  The SARS-CoV-2 RNA is generally detectable in upper and lower respiratory specimens during the acute phase of infection. Negative results do not preclude SARS-CoV-2 infection, do not rule out co-infections with other pathogens, and should not be used as the sole basis for treatment or other patient management decisions. Negative results must be combined with clinical observations, patient history, and epidemiological information. The expected result is Negative.  Fact Sheet for Patients: SugarRoll.be  Fact Sheet for Healthcare Providers: https://www.woods-mathews.com/  This test is not yet approved or cleared by the [place] FDA and  has been authorized for detection and/or diagnosis of SARS-CoV-2 by FDA under an Emergency Use Authorization (EUA). This EUA will remain  in effect (meaning this test can be used) for the duration of the COVID-19 declaration under Se ction 564(b)(1)  of the Act, 21 U.S.C. section 360bbb-3(b)(1), unless the authorization is terminated or revoked sooner.  Performed at [facility], [place] [address] [place], [place] [address]   MRSA Next Gen by PCR, Nasal     Status: None   Collection Time: 11/12/21  6:50 PM   Specimen: Nasal Mucosa; Nasal Swab  Result Value Ref Range Status   MRSA by PCR Next Gen NOT DETECTED NOT DETECTED Final    Comment: (NOTE) The GeneXpert MRSA Assay (FDA approved for NASAL specimens only), is one component of a comprehensive MRSA colonization surveillance program. It is not intended to diagnose MRSA infection nor to guide or monitor treatment for MRSA infections. Test performance is not FDA approved in patients less than 34 years old. Performed at [facility], [place] [address] [place], [place] [address]     Labs: Results for orders placed or performed during the hospital encounter of 11/12/21 (from the past 48 hour(s))  Prepare RBC (crossmatch)     Status: None   Collection Time: 11/12/21 11:29 AM  Result Value Ref Range   Order Confirmation      BB SAMPLE OR UNITS ALREADY AVAILABLE Performed at [facility], [address], [place], [place] [address]  I-STAT 7, (LYTES, BLD GAS, ICA, H+H)     Status: Abnormal   Collection Time: 11/12/21  2:51 PM  Result Value Ref Range   pH, Arterial 7.357 7.350 - 7.450   pCO2 arterial 40.4 32.0 - 48.0 mmHg   pO2, Arterial 266 (H) 83.0 - 108.0 mmHg   Bicarbonate 22.4 20.0 - 28.0 mmol/L   TCO2 24 22 - 32 mmol/L   O2 Saturation 100.0 %   Acid-base deficit 3.0 (H) 0.0 - 2.0 mmol/L   Sodium 139 135 - 145 mmol/L   Potassium 3.6 3.5 - 5.1 mmol/L   Calcium, Ion 1.16 1.15 - 1.40 mmol/L   HCT 30.0 (L) 39.0 - 52.0 %   Hemoglobin 10.2 (L) 13.0 - 17.0 g/dL   Patient temperature 38.0 C    Sample type ARTERIAL   CBC     Status: Abnormal   Collection Time: 11/12/21  6:50 PM  Result Value Ref Range   WBC 11.0 (H) 4.0 - 10.5 K/uL   RBC 3.30 (L) 4.22 - 5.81  MIL/uL   Hemoglobin 11.8 (L) 13.0 - 17.0 g/dL   HCT 34.6 (L) 39.0 - 52.0 %   MCV 104.8 (H) 80.0 - 100.0 fL   MCH 35.8 (H) 26.0 - 34.0 pg   MCHC 34.1 30.0 - 36.0 g/dL   RDW 13.8 11.5 - 15.5 %   Platelets 171 150 - 400 K/uL   nRBC 0.0 0.0 - 0.2 %    Comment: Performed at [facility], [place] [address], [place], [place] [address]  Creatinine, serum     Status: None   Collection Time: 11/12/21  6:50 PM  Result Value Ref Range   Creatinine, Ser 1.01 0.61 - 1.24 mg/dL   GFR, Estimated >60 >60 mL/min    Comment: (NOTE) Calculated using the CKD-EPI Creatinine Equation (2021) Performed at [facility] [address], [place], [place] [address]   MRSA Next Gen by PCR, Nasal     Status: None   Collection Time: 11/12/21  6:50 PM   Specimen: Nasal Mucosa; Nasal Swab  Result Value Ref Range   MRSA by PCR Next Gen NOT DETECTED NOT DETECTED    Comment: (NOTE) The GeneXpert MRSA Assay (FDA approved for NASAL specimens only), is one component of a comprehensive MRSA colonization surveillance program. It is not intended to diagnose MRSA infection nor to guide or monitor treatment for MRSA infections. Test performance is not FDA approved in patients less than 54 years old. Performed at [facility], [place] [address], [place], [place] [address]   Glucose, capillary     Status: Abnormal   Collection Time: 11/13/21 12:56 AM  Result Value Ref Range   Glucose-Capillary 179 (H) 70 - 99 mg/dL    Comment: Glucose reference range applies only to samples taken after fasting for at least 8 hours.  Glucose, capillary     Status: Abnormal   Collection Time: 11/13/21  3:55 AM  Result Value Ref Range   Glucose-Capillary 149 (H) 70 - 99 mg/dL    Comment: Glucose reference range applies only to samples taken after fasting for at least 8 hours.   Comment 1 Notify RN    Comment 2 Document in Chart   Comprehensive metabolic panel     Status: Abnormal   Collection Time: 11/13/21  5:30 AM   Result Value Ref Range   Sodium 135 135 - 145 mmol/L   Potassium 4.0 3.5 - 5.1 mmol/L   Chloride  103 98 - 111 mmol/L   CO2 26 22 - 32 mmol/L   Glucose, Bld 169 (H) 70 - 99 mg/dL    Comment: Glucose reference range applies only to samples taken after fasting for at least 8 hours.   BUN 12 6 - 20 mg/dL   Creatinine, Ser 0.78 0.61 - 1.24 mg/dL   Calcium 8.3 (L) 8.9 - 10.3 mg/dL   Total Protein 5.2 (L) 6.5 - 8.1 g/dL   Albumin 2.9 (L) 3.5 - 5.0 g/dL   AST 60 (H) 15 - 41 U/L   ALT 86 (H) 0 - 44 U/L   Alkaline Phosphatase 111 38 - 126 U/L   Total Bilirubin 1.9 (H) 0.3 - 1.2 mg/dL   GFR, Estimated >60 >60 mL/min    Comment: (NOTE) Calculated using the CKD-EPI Creatinine Equation (2021)    Anion gap 6 5 - 15    Comment: Performed at [facility], [place] [address] [place], [place] [address]  Magnesium     Status: None   Collection Time: 11/13/21  5:30 AM  Result Value Ref Range   Magnesium 1.7 1.7 - 2.4 mg/dL    Comment: Performed at [facility] [address] [place], [place] [address]  Phosphorus     Status: None   Collection Time: 11/13/21  5:30 AM  Result Value Ref Range   Phosphorus 3.7 2.5 - 4.6 mg/dL    Comment: Performed at [facility] [address] [place], [place] [address]  CBC     Status: Abnormal   Collection Time: 11/13/21  5:30 AM  Result Value Ref Range   WBC 10.2 4.0 - 10.5 K/uL   RBC 3.24 (L) 4.22 - 5.81 MIL/uL   Hemoglobin 11.8 (L) 13.0 - 17.0 g/dL   HCT 34.1 (L) 39.0 - 52.0 %   MCV 105.2 (H) 80.0 - 100.0 fL   MCH 36.4 (H) 26.0 - 34.0 pg   MCHC 34.6 30.0 - 36.0 g/dL   RDW 13.8 11.5 - 15.5 %   Platelets 163 150 - 400 K/uL   nRBC 0.0 0.0 - 0.2 %    Comment: Performed at [facility], [place] [address] [place], [place] [address]  Protime-INR     Status: None   Collection Time: 11/13/21  5:30 AM  Result Value Ref Range   Prothrombin Time 14.2 11.4 - 15.2 seconds   INR 1.1 0.8 - 1.2    Comment: (NOTE) INR goal varies based on  device and disease states. Performed at [facility], [place] [address], [place], [place] [address]   Glucose, capillary     Status: Abnormal   Collection Time: 11/13/21  8:26 AM  Result Value Ref Range   Glucose-Capillary 162 (H) 70 - 99 mg/dL    Comment: Glucose reference range applies only to samples taken after fasting for at least 8 hours.  Glucose, capillary     Status: Abnormal   Collection Time: 11/13/21  1:16 PM  Result Value Ref Range   Glucose-Capillary 120 (H) 70 - 99 mg/dL    Comment: Glucose reference range applies only to samples taken after fasting for at least 8 hours.  Glucose, capillary     Status: Abnormal   Collection Time: 11/13/21  4:23 PM  Result Value Ref Range   Glucose-Capillary 103 (H) 70 - 99 mg/dL    Comment: Glucose reference range applies only to samples taken after fasting for at least 8 hours.  Glucose, capillary     Status: Abnormal   Collection Time: 11/13/21  8:08 PM  Result Value Ref Range   Glucose-Capillary 107 (H) 70 - 99 mg/dL    Comment: Glucose reference range applies only to samples taken after fasting for at least 8 hours.  Glucose, capillary     Status: Abnormal   Collection Time: 11/13/21 11:21 PM  Result Value Ref Range   Glucose-Capillary 116 (H) 70 - 99 mg/dL    Comment: Glucose reference range applies only to samples taken after fasting for at least 8 hours.  Comprehensive metabolic panel     Status: Abnormal   Collection Time: 11/14/21  3:00 AM  Result Value Ref Range   Sodium 136 135 - 145 mmol/L   Potassium 4.3 3.5 - 5.1 mmol/L   Chloride 102 98 - 111 mmol/L   CO2 25 22 - 32 mmol/L   Glucose, Bld 115 (H) 70 - 99 mg/dL    Comment: Glucose reference range applies only to samples taken after fasting for at least 8 hours.   BUN 10 6 - 20 mg/dL   Creatinine, Ser 0.84 0.61 - 1.24 mg/dL   Calcium 8.9 8.9 - 10.3 mg/dL   Total Protein 5.8 (L) 6.5 - 8.1 g/dL   Albumin 2.8 (L) 3.5 - 5.0 g/dL   AST 37 15 - 41 U/L   ALT 67 (H) 0  - 44 U/L   Alkaline Phosphatase 106 38 - 126 U/L   Total Bilirubin 2.1 (H) 0.3 - 1.2 mg/dL   GFR, Estimated >60 >60 mL/min    Comment: (NOTE) Calculated using the CKD-EPI Creatinine Equation (2021)    Anion gap 9 5 - 15    Comment: Performed at [facility], [place] [name], [place], [place] [address]  Magnesium     Status: None   Collection Time: 11/14/21  3:00 AM  Result Value Ref Range   Magnesium 1.8 1.7 - 2.4 mg/dL    Comment: Performed at [facility] [address] [place], [place] [address]  Phosphorus     Status: None   Collection Time: 11/14/21  3:00 AM  Result Value Ref Range   Phosphorus 3.0 2.5 - 4.6 mg/dL    Comment: Performed at [facility] [address] [place], [place] [address]  CBC     Status: Abnormal   Collection Time: 11/14/21  3:00 AM  Result Value Ref Range   WBC 12.9 (H) 4.0 - 10.5 K/uL   RBC 3.17 (L) 4.22 - 5.81 MIL/uL   Hemoglobin 11.7 (L) 13.0 - 17.0 g/dL   HCT 33.9 (L) 39.0 - 52.0 %   MCV 106.9 (H) 80.0 - 100.0 fL   MCH 36.9 (H) 26.0 - 34.0 pg   MCHC 34.5 30.0 - 36.0 g/dL   RDW 13.6 11.5 - 15.5 %   Platelets 160 150 - 400 K/uL   nRBC 0.0 0.0 - 0.2 %    Comment: Performed at [facility], [place] [address] [place], [place] [address]  Protime-INR     Status: None   Collection Time: 11/14/21  3:00 AM  Result Value Ref Range   Prothrombin Time 14.0 11.4 - 15.2 seconds   INR 1.1 0.8 - 1.2    Comment: (NOTE) INR goal varies based on device and disease states. Performed at [facility], [place] [address], [place], [place] [address]   Glucose, capillary     Status: Abnormal  Collection Time: 11/14/21  3:27 AM  Result Value Ref Range   Glucose-Capillary 125 (H) 70 - 99 mg/dL    Comment: Glucose reference range applies only to samples taken after fasting for at least 8 hours.  Glucose, capillary     Status: Abnormal   Collection Time: 11/14/21  7:54 AM  Result Value Ref Range   Glucose-Capillary 111 (H) 70 - 99 mg/dL     Comment: Glucose reference range applies only to samples taken after fasting for at least 8 hours.    Imaging / Studies: No results found.  Medications / Allergies: per chart  Antibiotics: Anti-infectives (From admission, onward)    Start     Dose/Rate Route Frequency Ordered Stop   11/12/21 1900  ceFAZolin (ANCEF) IVPB 2g/100 mL premix        2 g 200 mL/hr over 30 Minutes Intravenous Every 8 hours 11/12/21 1713 11/12/21 2013   11/12/21 0700  ceFAZolin (ANCEF) IVPB 2g/100 mL premix        2 g 200 mL/hr over 30 Minutes Intravenous On call to O.R. 11/12/21 0653 11/12/21 1330         Note: Portions of this report may have been transcribed using voice recognition software. Every effort was made to ensure accuracy; however, inadvertent computerized transcription errors may be present.   Any transcriptional errors that result from this process are unintentional.    Adin Hector, MD, FACS, MASCRS Esophageal, Gastrointestinal & Colorectal Surgery Robotic and Minimally Invasive Surgery  [facility], [place]  [place] [place], [place] [address] [phone] Fax [phone] Main  CONTACT INFORMATION:  Weekday (9AM-5PM): Call [facility] main office at [phone]  Weeknight (5PM-9AM) or Weekend/Holiday: Check [email or web address] (password " [email or web address]") for General Surgery [facility] coverage  (Please, do not use SecureChat as it is not reliable communication to operating surgeons for immediate patient care)      11/14/2021  8:21 AM

## 2021-11-14 NOTE — Progress Notes (Signed)
NGT clamped at this time per order.

## 2021-11-14 NOTE — Progress Notes (Signed)
Patient with approximately 36cc/hr urine output via Foley. I&O for shift noted +1L. Patient continues with epidural infusion. Anesthesia made aware of UO for the day, no concerns on their end. Surgery Dr. Thermon Leyland also made aware of I&O/urine output, no further orders.

## 2021-11-14 NOTE — Progress Notes (Signed)
Patient's pain much improved since epidural pump switched, and bolus administered by anesthesia. Patient slightly hypotensive 88/64 MAP 71 HR105. Dr. Ola Spurr from anesthesia aware, reduce epidural infusion back to 7cc/hr and give 500cc bolus X1.

## 2021-11-14 NOTE — Progress Notes (Signed)
Level/amount of Ropivacaine in bottle unchanged overnight despite pump screen indicating it's running. Nothing clamped on tubing, epidural site clean/dry/intact. Patient states pain control better with dilaudid pushes but still 7/10 in bed at rest "sharp and burning". Anesthesia paged and will assess.  Dr. Johney Maine aware of possible epidural pump issue as well. PCA to be restarted today.

## 2021-11-15 ENCOUNTER — Encounter (HOSPITAL_COMMUNITY): Payer: Self-pay | Admitting: General Surgery

## 2021-11-15 LAB — CBC
HCT: 31.8 % — ABNORMAL LOW (ref 39.0–52.0)
Hemoglobin: 10.5 g/dL — ABNORMAL LOW (ref 13.0–17.0)
MCH: 35.6 pg — ABNORMAL HIGH (ref 26.0–34.0)
MCHC: 33 g/dL (ref 30.0–36.0)
MCV: 107.8 fL — ABNORMAL HIGH (ref 80.0–100.0)
Platelets: 144 10*3/uL — ABNORMAL LOW (ref 150–400)
RBC: 2.95 MIL/uL — ABNORMAL LOW (ref 4.22–5.81)
RDW: 13.2 % (ref 11.5–15.5)
WBC: 10.1 10*3/uL (ref 4.0–10.5)
nRBC: 0 % (ref 0.0–0.2)

## 2021-11-15 LAB — COMPREHENSIVE METABOLIC PANEL
ALT: 51 U/L — ABNORMAL HIGH (ref 0–44)
AST: 24 U/L (ref 15–41)
Albumin: 2.4 g/dL — ABNORMAL LOW (ref 3.5–5.0)
Alkaline Phosphatase: 128 U/L — ABNORMAL HIGH (ref 38–126)
Anion gap: 7 (ref 5–15)
BUN: 13 mg/dL (ref 6–20)
CO2: 25 mmol/L (ref 22–32)
Calcium: 8.9 mg/dL (ref 8.9–10.3)
Chloride: 103 mmol/L (ref 98–111)
Creatinine, Ser: 0.87 mg/dL (ref 0.61–1.24)
GFR, Estimated: >60 mL/min (ref >60)
Glucose, Bld: 104 mg/dL — ABNORMAL HIGH (ref 70–99)
Potassium: 4.2 mmol/L (ref 3.5–5.1)
Sodium: 135 mmol/L (ref 135–145)
Total Bilirubin: 2 mg/dL — ABNORMAL HIGH (ref 0.3–1.2)
Total Protein: 5.4 g/dL — ABNORMAL LOW (ref 6.5–8.1)

## 2021-11-15 LAB — GLUCOSE, CAPILLARY
Glucose-Capillary: 106 mg/dL — ABNORMAL HIGH (ref 70–99)
Glucose-Capillary: 106 mg/dL — ABNORMAL HIGH (ref 70–99)
Glucose-Capillary: 109 mg/dL — ABNORMAL HIGH (ref 70–99)
Glucose-Capillary: 113 mg/dL — ABNORMAL HIGH (ref 70–99)
Glucose-Capillary: 117 mg/dL — ABNORMAL HIGH (ref 70–99)
Glucose-Capillary: 123 mg/dL — ABNORMAL HIGH (ref 70–99)
Glucose-Capillary: 94 mg/dL (ref 70–99)

## 2021-11-15 LAB — PHOSPHORUS: Phosphorus: 2.9 mg/dL (ref 2.5–4.6)

## 2021-11-15 LAB — PROTIME-INR
INR: 1 (ref 0.8–1.2)
Prothrombin Time: 13.5 seconds (ref 11.4–15.2)

## 2021-11-15 LAB — MAGNESIUM: Magnesium: 1.9 mg/dL (ref 1.7–2.4)

## 2021-11-15 NOTE — Progress Notes (Signed)
OT Cancellation Note  Patient Details Name: Dean [name] MRN: [id number] DOB: 1966/05/30   Cancelled Treatment:    Reason Eval/Treat Not Completed: Patient not medically ready (whipple at 8 AM)  Billey Chang, OTR/L  Acute Rehabilitation Services Pager: [phone] Office: [phone] .  11/15/2021, 8:53 AM

## 2021-11-15 NOTE — Anesthesia Postprocedure Evaluation (Signed)
Anesthesia Post Note  Patient: Dean [name]  Procedure(s) Performed: DIAGNOSTIC LAPAROSCOPY (Abdomen) WHIPPLE PROCEDURE (Abdomen)     Patient location during evaluation: PACU Anesthesia Type: Epidural and General Level of consciousness: awake and alert Pain management: pain level controlled Vital Signs Assessment: post-procedure vital signs reviewed and stable Respiratory status: spontaneous breathing, nonlabored ventilation, respiratory function stable and patient connected to nasal cannula oxygen Cardiovascular status: blood pressure returned to baseline and stable Postop Assessment: no apparent nausea or vomiting Anesthetic complications: no   No notable events documented.  Last Vitals:  Vitals:   11/15/21 0600 11/15/21 0700  BP: 116/78 121/81  Pulse: 89 90  Resp: (!) 7 (!) 0  Temp:    SpO2: 95% 98%    Last Pain:  Vitals:   11/15/21 0412  TempSrc:   PainSc: 2                  Keeton Kassebaum

## 2021-11-15 NOTE — Anesthesia Post-op Follow-up Note (Signed)
  Anesthesia Pain Follow-up Note  Patient: Dean [name]  Day #: 3  Date of Follow-up: 11/15/2021 Time: 10:23 AM  Last Vitals:  Vitals:   11/15/21 0830 11/15/21 0900  BP: 121/81 120/79  Pulse: 89 88  Resp: 13   Temp:    SpO2: 98% 97%    Level of Consciousness: alert  Pain: none   Side Effects:None  Catheter Site Exam:clean, dry, no drainage  Anti-Coag Meds (From admission, onward)   Start     Dose/Rate Route Frequency Ordered Stop   11/13/21 0600  heparin injection 5,000 Units        5,000 Units Subcutaneous Every 8 hours 11/12/21 1713      Epidural / Intrathecal (From admission, onward)   Start     Dose/Rate Route Frequency Ordered Stop   11/13/21 1500  ropivacaine (PF) 2 mg/mL (0.2%) (NAROPIN) injection        7 mL/hr 7 mL/hr  Epidural Continuous 11/13/21 1413         Plan: Continue current therapy of postop epidural at surgeon's request  Laurelin Elson P Maijor Hornig

## 2021-11-15 NOTE — Progress Notes (Signed)
  Transition of Care (TOC) Screening Note   Patient Details  Name: Dean [name] Date of Birth: 05-16-1966   Transition of Care [facility]) CM/SW Contact:    Geralynn Ochs, LCSW Phone Number: 11/15/2021, 3:33 PM    Transition of Care Department [facility]) has reviewed patient and no TOC needs have been identified at this time; medical workup ongoing. We will continue to monitor patient advancement through interdisciplinary progression rounds.

## 2021-11-15 NOTE — Addendum Note (Signed)
Addendum  created 11/15/21 1023 by Murvin Natal, MD   Clinical Note Signed

## 2021-11-15 NOTE — Progress Notes (Signed)
3 Days Post-Op   Subjective/Chief Complaint: No nausea. Tolerated clears with NGT clamped. No flatus   Objective: Vital signs in last 24 hours: Temp:  [98.3 F (36.8 C)-99.4 F (37.4 C)] 98.4 F (36.9 C) (12/12 0800) Pulse Rate:  [85-117] 92 (12/12 0800) Resp:  [0-26] 15 (12/12 0816) BP: (92-130)/(62-85) 127/77 (12/12 0800) SpO2:  [95 %-100 %] 99 % (12/12 0816) FiO2 (%):  [30 %] 30 % (12/12 0816) Last BM Date:  (pta)  Intake/Output from previous day: 12/11 0701 - 12/12 0700 In: 2362.6 [I.V.:1093.9; IV Piggyback:1074.3] Out: 1020 [Urine:775; Drains:245] Intake/Output this shift: No intake/output data recorded.  General appearance: alert, cooperative, and no distress Resp: breathing comfortably Cardio: regular rate and rhythm GI: soft, non distended, approp tender. Dressing c/d/I. Drains serosang Extremities: no edema, well perfused.   Lab Results:  Recent Labs    11/14/21 0300 11/15/21 0454  WBC 12.9* 10.1  HGB 11.7* 10.5*  HCT 33.9* 31.8*  PLT 160 144*   BMET Recent Labs    11/14/21 0300 11/15/21 0454  NA 136 135  K 4.3 4.2  CL 102 103  CO2 25 25  GLUCOSE 115* 104*  BUN 10 13  CREATININE 0.84 0.87  CALCIUM 8.9 8.9   PT/INR Recent Labs    11/14/21 0300 11/15/21 0454  LABPROT 14.0 13.5  INR 1.1 1.0   ABG Recent Labs    11/12/21 1451  PHART 7.357  HCO3 22.4    Studies/Results: No results found.  Anti-infectives: Anti-infectives (From admission, onward)    Start     Dose/Rate Route Frequency Ordered Stop   11/12/21 1900  ceFAZolin (ANCEF) IVPB 2g/100 mL premix        2 g 200 mL/hr over 30 Minutes Intravenous Every 8 hours 11/12/21 1713 11/12/21 2013   11/12/21 0700  ceFAZolin (ANCEF) IVPB 2g/100 mL premix        2 g 200 mL/hr over 30 Minutes Intravenous On call to O.R. 11/12/21 0653 11/12/21 1330       Assessment/Plan: s/p Procedure(s): DIAGNOSTIC LAPAROSCOPY (N/A) WHIPPLE PROCEDURE (N/A) Await pathology D/c ngt D/c  foley Clears Epidural and pca for pain control SSI for hyperglycemia   LOS: 3 days    Stark Klein 11/15/2021

## 2021-11-16 LAB — COMPREHENSIVE METABOLIC PANEL
ALT: 38 U/L (ref 0–44)
AST: 17 U/L (ref 15–41)
Albumin: 2 g/dL — ABNORMAL LOW (ref 3.5–5.0)
Alkaline Phosphatase: 213 U/L — ABNORMAL HIGH (ref 38–126)
Anion gap: 5 (ref 5–15)
BUN: 9 mg/dL (ref 6–20)
CO2: 28 mmol/L (ref 22–32)
Calcium: 8.6 mg/dL — ABNORMAL LOW (ref 8.9–10.3)
Chloride: 99 mmol/L (ref 98–111)
Creatinine, Ser: 0.85 mg/dL (ref 0.61–1.24)
GFR, Estimated: >60 mL/min (ref >60)
Glucose, Bld: 109 mg/dL — ABNORMAL HIGH (ref 70–99)
Potassium: 3.7 mmol/L (ref 3.5–5.1)
Sodium: 132 mmol/L — ABNORMAL LOW (ref 135–145)
Total Bilirubin: 1.6 mg/dL — ABNORMAL HIGH (ref 0.3–1.2)
Total Protein: 4.9 g/dL — ABNORMAL LOW (ref 6.5–8.1)

## 2021-11-16 LAB — CBC
HCT: 29 % — ABNORMAL LOW (ref 39.0–52.0)
Hemoglobin: 9.9 g/dL — ABNORMAL LOW (ref 13.0–17.0)
MCH: 36.4 pg — ABNORMAL HIGH (ref 26.0–34.0)
MCHC: 34.1 g/dL (ref 30.0–36.0)
MCV: 106.6 fL — ABNORMAL HIGH (ref 80.0–100.0)
Platelets: 127 10*3/uL — ABNORMAL LOW (ref 150–400)
RBC: 2.72 MIL/uL — ABNORMAL LOW (ref 4.22–5.81)
RDW: 13.1 % (ref 11.5–15.5)
WBC: 7.4 10*3/uL (ref 4.0–10.5)
nRBC: 0 % (ref 0.0–0.2)

## 2021-11-16 LAB — SURGICAL PATHOLOGY

## 2021-11-16 LAB — GLUCOSE, CAPILLARY
Glucose-Capillary: 107 mg/dL — ABNORMAL HIGH (ref 70–99)
Glucose-Capillary: 126 mg/dL — ABNORMAL HIGH (ref 70–99)

## 2021-11-16 MED ORDER — PANTOPRAZOLE SODIUM 40 MG PO TBEC
40.0000 mg | DELAYED_RELEASE_TABLET | Freq: Every day | ORAL | Status: DC
  Administered 2021-11-16 – 2021-11-26 (×11): 40 mg via ORAL
  Filled 2021-11-16 (×11): qty 1

## 2021-11-16 MED ORDER — OXYCODONE HCL 5 MG PO TABS
5.0000 mg | ORAL_TABLET | ORAL | Status: DC | PRN
  Administered 2021-11-16 – 2021-11-17 (×5): 5 mg via ORAL
  Administered 2021-11-18: 10 mg via ORAL
  Administered 2021-11-18: 5 mg via ORAL
  Administered 2021-11-19 – 2021-11-24 (×13): 10 mg via ORAL
  Administered 2021-11-25 (×2): 5 mg via ORAL
  Administered 2021-11-25: 05:00:00 10 mg via ORAL
  Filled 2021-11-16 (×3): qty 2
  Filled 2021-11-16: qty 1
  Filled 2021-11-16 (×3): qty 2
  Filled 2021-11-16: qty 1
  Filled 2021-11-16 (×2): qty 2
  Filled 2021-11-16 (×3): qty 1
  Filled 2021-11-16: qty 2
  Filled 2021-11-16: qty 1
  Filled 2021-11-16 (×6): qty 2
  Filled 2021-11-16: qty 1
  Filled 2021-11-16: qty 2

## 2021-11-16 MED ORDER — LEVOTHYROXINE SODIUM 75 MCG PO TABS
150.0000 ug | ORAL_TABLET | Freq: Every day | ORAL | Status: DC
  Administered 2021-11-17 – 2021-11-27 (×11): 150 ug via ORAL
  Filled 2021-11-16 (×11): qty 2

## 2021-11-16 MED ORDER — DIVALPROEX SODIUM ER 500 MG PO TB24
2000.0000 mg | ORAL_TABLET | Freq: Every day | ORAL | Status: DC
  Administered 2021-11-16 – 2021-11-26 (×11): 2000 mg via ORAL
  Filled 2021-11-16 (×12): qty 4

## 2021-11-16 NOTE — Progress Notes (Signed)
PCA dilaudid syringe changed with Maudie Mercury RN, 105mls left in syringe. 58mls dilaudid wasted in pyxis and put in stericycle with Margart Sickles.

## 2021-11-16 NOTE — Progress Notes (Signed)
Physical Therapy Treatment Patient Details Name: Dean [name] MRN: [id number] DOB: 05/01/1966 Today's Date: 11/16/2021   History of Present Illness Patient is a 55 y/o male who presents s/p whipple procedure 11/12/21 secondary to Malignant biliary obstruction. PMH includes bipolar disorder.    PT Comments    Pt moving much better today, ambulated 150' in hallway with min A. Ambulated in room without AD but maintained trunk flexion and moving very slowly. With RW was able to achieve more erect posture and increase gait speed. Pt noted to have delayed processing and some word finding difficulties today, expect due to pain meds. Recommend HHPT at d/c with hopeful quick transition to OP. PT will continue to follow.    Recommendations for follow up therapy are one component of a multi-disciplinary discharge planning process, led by the attending physician.  Recommendations may be updated based on patient status, additional functional criteria and insurance authorization.  Follow Up Recommendations  Home health PT     Assistance Recommended at Discharge Intermittent Supervision/Assistance  Equipment Recommendations  Rolling walker (2 wheels) (pending improvement)    Recommendations for Other Services       Precautions / Restrictions Precautions Precautions: Other (comment);Fall Precaution Comments: 2 drains, PCA, epidural Restrictions Weight Bearing Restrictions: No     Mobility  Bed Mobility               General bed mobility comments: up EOB having worked with OT    Transfers Overall transfer level: Needs assistance Equipment used: Rolling walker (2 wheels) Transfers: Sit to/from Stand Sit to Stand: Min assist;From elevated surface           General transfer comment: vc's for reaching for chair and grasping before sitting    Ambulation/Gait Ambulation/Gait assistance: Min assist Gait Distance (Feet): 150 Feet Assistive device: Rolling walker (2  wheels);None Gait Pattern/deviations: Trunk flexed Gait velocity: decreased Gait velocity interpretation: <1.8 ft/sec, indicate of risk for recurrent falls   General Gait Details: pt ambulated in room without AD but trunk flexed and gait very slow. Used RW in hallway to work on more erect posture and tolerating increased gait speed. Pt had some problem solving difficulties navigating RW at first but improved as he kept going. Also with depth perception deficits but he reports that he usually wears glasses   Stairs             Wheelchair Mobility    Modified Rankin (Stroke Patients Only)       Balance Overall balance assessment: Needs assistance Sitting-balance support: Feet supported;Bilateral upper extremity supported Sitting balance-Leahy Scale: Fair     Standing balance support: During functional activity;Reliant on assistive device for balance Standing balance-Leahy Scale: Poor Standing balance comment: reliant on UE support for stability. Can maintain static stance without support                            Cognition Arousal/Alertness: Awake/alert Behavior During Therapy: WFL for tasks assessed/performed Overall Cognitive Status: Impaired/Different from baseline Area of Impairment: Memory;Problem solving                     Memory: Decreased short-term memory       Problem Solving: Slow processing General Comments: pt with some word finding difficulties and slow processing, possibly due to pain meds        Exercises      General Comments General comments (skin integrity, edema, etc.): pt aware  of cognitive delay and comments that his brain is "just not right" today. HR 90's, SPO2 in 90's      Pertinent Vitals/Pain Pain Assessment: Faces Faces Pain Scale: Hurts a little bit Pain Location: abdomen Pain Descriptors / Indicators: Guarding Pain Intervention(s): Limited activity within patient's tolerance;Monitored during session     Home Living                          Prior Function            PT Goals (current goals can now be found in the care plan section) Acute Rehab PT Goals Patient Stated Goal: to return to independence PT Goal Formulation: With patient Time For Goal Achievement: 11/28/21 Potential to Achieve Goals: Good Progress towards PT goals: Progressing toward goals    Frequency    Min 3X/week      PT Plan Current plan remains appropriate    Co-evaluation              AM-PAC PT "6 Clicks" Mobility   Outcome Measure  Help needed turning from your back to your side while in a flat bed without using bedrails?: A Little Help needed moving from lying on your back to sitting on the side of a flat bed without using bedrails?: A Little Help needed moving to and from a bed to a chair (including a wheelchair)?: A Little Help needed standing up from a chair using your arms (e.g., wheelchair or bedside chair)?: A Little Help needed to walk in hospital room?: A Little Help needed climbing 3-5 steps with a railing? : A Little 6 Click Score: 18    End of Session   Activity Tolerance: Patient tolerated treatment well Patient left: in chair;with call bell/phone within reach Nurse Communication: Mobility status PT Visit Diagnosis: Difficulty in walking, not elsewhere classified (R26.2);Muscle weakness (generalized) (M62.81);Pain Pain - part of body:  (abdomen)     Time: 4008-6761 PT Time Calculation (min) (ACUTE ONLY): 24 min  Charges:  $Gait Training: 23-37 mins                     Leighton Roach, [place]  Pager [phone] Office [place] 11/16/2021, 12:28 PM

## 2021-11-16 NOTE — Progress Notes (Signed)
4 Days Post-Op   Subjective/Chief Complaint: Tolerated clears and NGT removal.  Voided after foley removal.  Has been OOB.     Objective: Vital signs in last 24 hours: Temp:  [97.6 F (36.4 C)-98.9 F (37.2 C)] 98.2 F (36.8 C) (12/13 0730) Pulse Rate:  [79-102] 88 (12/13 0900) Resp:  [8-21] 18 (12/13 0741) BP: (103-142)/(67-103) 127/86 (12/13 0900) SpO2:  [91 %-100 %] 98 % (12/13 0900) FiO2 (%):  [0 %-30 %] 0 % (12/13 0741) Last BM Date:  (PTA)  Intake/Output from previous day: 12/12 0701 - 12/13 0700 In: 2981.1 [P.O.:1070; I.V.:1250.4; IV Piggyback:485.8] Out: 735 [Urine:625; Drains:110] Intake/Output this shift: Total I/O In: 632.5 [P.O.:360; I.V.:100; Other:14; IV Piggyback:158.5] Out: 275 [Urine:275]  General appearance: alert, cooperative, and no distress Resp: breathing comfortably Cardio: regular rate and rhythm GI: soft, non distended, approp tender. Dressing c/d/I. Drains serosang Extremities: no edema, well perfused.   Lab Results:  Recent Labs    11/15/21 0454 11/16/21 0635  WBC 10.1 7.4  HGB 10.5* 9.9*  HCT 31.8* 29.0*  PLT 144* 127*   BMET Recent Labs    11/15/21 0454 11/16/21 0635  NA 135 132*  K 4.2 3.7  CL 103 99  CO2 25 28  GLUCOSE 104* 109*  BUN 13 9  CREATININE 0.87 0.85  CALCIUM 8.9 8.6*   PT/INR Recent Labs    11/14/21 0300 11/15/21 0454  LABPROT 14.0 13.5  INR 1.1 1.0   ABG No results for input(s): PHART, HCO3 in the last 72 hours.  Invalid input(s): PCO2, PO2   Studies/Results: No results found.  Anti-infectives: Anti-infectives (From admission, onward)    Start     Dose/Rate Route Frequency Ordered Stop   11/12/21 1900  ceFAZolin (ANCEF) IVPB 2g/100 mL premix        2 g 200 mL/hr over 30 Minutes Intravenous Every 8 hours 11/12/21 1713 11/12/21 2013   11/12/21 0700  ceFAZolin (ANCEF) IVPB 2g/100 mL premix        2 g 200 mL/hr over 30 Minutes Intravenous On call to O.R. 11/12/21 0653 11/12/21 1330        Assessment/Plan: s/p Procedure(s): DIAGNOSTIC LAPAROSCOPY (N/A) WHIPPLE PROCEDURE (N/A) Await pathology Full liquids Add oxycodone. If doing well, d/c epidural and PCA tomorrow Anticipate transfer to floor tomorrow.  If no n/v will advance to soft diet in AM.   LOS: 4 days    Stark Klein 11/16/2021

## 2021-11-16 NOTE — Evaluation (Signed)
Occupational Therapy Evaluation Patient Details Name: Dean [name] MRN: [id number] DOB: 01-Oct-1966 Today's Date: 11/16/2021   History of Present Illness Patient is a 55 y/o male who presents s/p whipple procedure 11/12/21 secondary to Malignant biliary obstruction. PMH includes bipolar disorder.   Clinical Impression   Patient is s/p whipple surgery resulting in functional limitations due to the deficits listed below (see OT problem list). Pt currently with good pain management and able to navigate ADL task. Pt could have increased (A) needs with epidural removal.  Patient will benefit from skilled OT acutely to increase independence and safety with ADLS to allow discharge [place].       Recommendations for follow up therapy are one component of a multi-disciplinary discharge planning process, led by the attending physician.  Recommendations may be updated based on patient status, additional functional criteria and insurance authorization.   Follow Up Recommendations  Home health OT    Assistance Recommended at Discharge    Functional Status Assessment  Patient has had a recent decline in their functional status and demonstrates the ability to make significant improvements in function in a reasonable and predictable amount of time.  Equipment Recommendations  BSC/3in1 (RW)    Recommendations for Other Services       Precautions / Restrictions Precautions Precautions: Other (comment);Fall Precaution Comments: 2 drains, PCA, epidural Restrictions Weight Bearing Restrictions: No      Mobility Bed Mobility Overal bed mobility: Modified Independent             General bed mobility comments: Ot asking pt to pause due to lines but otherwise would be indep at home    Transfers Overall transfer level: Needs assistance Equipment used: Rolling walker (2 wheels) Transfers: Sit to/from Stand Sit to Stand: Min guard           General transfer comment: pt states he can  walk with RW but demonstrates increased flexed posture with time up      Balance Overall balance assessment: Needs assistance Sitting-balance support: Feet supported;Bilateral upper extremity supported Sitting balance-Leahy Scale: Fair     Standing balance support: During functional activity;Reliant on assistive device for balance Standing balance-Leahy Scale: Poor Standing balance comment: reliant on UE support for stability. Can maintain static stance without support                           ADL either performed or assessed with clinical judgement   ADL Overall ADL's : Needs assistance/impaired Eating/Feeding: Independent   Grooming: Supervision/safety;Standing   Upper Body Bathing: Supervision/ safety;Sitting   Lower Body Bathing: Minimal assistance;Sit to/from stand   Upper Body Dressing : Supervision/safety       Toilet Transfer: Min guard   Toileting- Clothing Manipulation and Hygiene: Minimal assistance       Functional mobility during ADLs: Min guard General ADL Comments: pt requires (A) for line management     Vision Baseline Vision/History: 1 Wears glasses       Perception     Praxis      Pertinent Vitals/Pain Pain Assessment: No/denies pain Faces Pain Scale: Hurts a little bit Pain Location: abdomen Pain Descriptors / Indicators: Guarding Pain Intervention(s): Limited activity within patient's tolerance;Monitored during session     Hand Dominance Right   Extremity/Trunk Assessment Upper Extremity Assessment Upper Extremity Assessment: Overall WFL for tasks assessed   Lower Extremity Assessment Lower Extremity Assessment: Defer to PT evaluation   Cervical / Trunk Assessment Cervical / Trunk  Assessment: Other exceptions (pt with a flexed posture due to abdominal wound but able to extend when asked)   Communication Communication Communication: No difficulties   Cognition Arousal/Alertness: Awake/alert Behavior During Therapy:  WFL for tasks assessed/performed Overall Cognitive Status: Impaired/Different from baseline Area of Impairment: Problem solving                     Memory: Decreased short-term memory       Problem Solving: Slow processing General Comments: pt with decreased speed to recall     General Comments  RA VSS has epidural and pCA pump in use    Exercises     Shoulder Instructions      Home Living Family/patient expects to be discharged to:: Private residence Living Arrangements: Children;Spouse/significant other;Other relatives;Alone Available Help at Discharge: Family;Available 24 hours/day Type of Home: House Home Access: Stairs to enter [employer] of Steps: 3 Entrance Stairs-Rails: None Home Layout: One level     Bathroom Shower/Tub: Walk-in shower;Tub/shower unit   [employer]: Standard     Home Equipment: Civil engineer, contracting;Wheelchair - manual          Prior Functioning/Environment Prior Level of Function : Independent/Modified Independent             Mobility Comments: Independent, works as Librarian, academic. ADLs Comments: independent        OT Problem List: Decreased strength;Decreased activity tolerance;Impaired balance (sitting and/or standing);Decreased cognition;Decreased safety awareness;Decreased knowledge of use of DME or AE;Decreased knowledge of precautions;Cardiopulmonary status limiting activity      OT Treatment/Interventions: Self-care/ADL training;Therapeutic exercise;Energy conservation;DME and/or AE instruction;Therapeutic activities;Cognitive remediation/compensation;Patient/family education;Balance training    OT Goals(Current goals can be found in the care plan section) Acute Rehab OT Goals Patient Stated Goal: to get up and do something today OT Goal Formulation: With patient Time For Goal Achievement: 11/30/21 Potential to Achieve Goals: Good  OT Frequency: Min 2X/week   Barriers to D/C:            Co-evaluation               AM-PAC OT "6 Clicks" Daily Activity     Outcome Measure Help from another person eating meals?: A Little Help from another person taking care of personal grooming?: A Little Help from another person toileting, which includes using toliet, bedpan, or urinal?: A Little Help from another person bathing (including washing, rinsing, drying)?: A Little Help from another person to put on and taking off regular upper body clothing?: A Little Help from another person to put on and taking off regular lower body clothing?: A Little 6 Click Score: 18   End of Session Nurse Communication: Mobility status;Precautions  Activity Tolerance: Patient tolerated treatment well Patient left: Other (comment) (with PT Jocelyn Lamer)  OT Visit Diagnosis: Unsteadiness on feet (R26.81);Muscle weakness (generalized) (M62.81)                Time: 1829-9371 OT Time Calculation (min): 15 min Charges:  OT General Charges $OT Visit: 1 Visit OT Evaluation $OT Eval Moderate Complexity: 1 Mod   Brynn, OTR/L  Acute Rehabilitation Services Pager: [phone] Office: [phone] .   Jeri Modena 11/16/2021, 4:05 PM

## 2021-11-16 NOTE — Anesthesia Post-op Follow-up Note (Signed)
°  Anesthesia Pain Follow-up Note  Patient: Levander Katzenstein Sexson  Day #: 4  Date of Follow-up: 11/16/2021 Time: 10:40 AM  Last Vitals:  Vitals:   11/16/21 0800 11/16/21 0900  BP: 123/80 127/86  Pulse: 88 88  Resp:    Temp:    SpO2: 96% 98%    Level of Consciousness: alert  Pain: mild   Side Effects:None  Catheter Site Exam:clean, dry, no drainage  Anti-Coag Meds (From admission, onward)   Start     Dose/Rate Route Frequency Ordered Stop   11/13/21 0600  heparin injection 5,000 Units        5,000 Units Subcutaneous Every 8 hours 11/12/21 1713      Epidural / Intrathecal (From admission, onward)   Start     Dose/Rate Route Frequency Ordered Stop   11/13/21 1500  ropivacaine (PF) 2 mg/mL (0.2%) (NAROPIN) injection        7 mL/hr 7 mL/hr  Epidural Continuous 11/13/21 1413         Plan: Continue current therapy of postop epidural at surgeon's request.  Per surgeon, OK to d/c epidural infusion and remove epidural on morning of 12/14.  Catalina Gravel

## 2021-11-16 NOTE — Addendum Note (Signed)
Addendum  created 11/16/21 1041 by Catalina Gravel, MD   Clinical Note Signed

## 2021-11-17 LAB — COMPREHENSIVE METABOLIC PANEL
ALT: 33 U/L (ref 0–44)
AST: 17 U/L (ref 15–41)
Albumin: 2 g/dL — ABNORMAL LOW (ref 3.5–5.0)
Alkaline Phosphatase: 277 U/L — ABNORMAL HIGH (ref 38–126)
Anion gap: 5 (ref 5–15)
BUN: 7 mg/dL (ref 6–20)
CO2: 28 mmol/L (ref 22–32)
Calcium: 8.5 mg/dL — ABNORMAL LOW (ref 8.9–10.3)
Chloride: 98 mmol/L (ref 98–111)
Creatinine, Ser: 0.82 mg/dL (ref 0.61–1.24)
GFR, Estimated: >60 mL/min (ref >60)
Glucose, Bld: 106 mg/dL — ABNORMAL HIGH (ref 70–99)
Potassium: 3.5 mmol/L (ref 3.5–5.1)
Sodium: 131 mmol/L — ABNORMAL LOW (ref 135–145)
Total Bilirubin: 1.5 mg/dL — ABNORMAL HIGH (ref 0.3–1.2)
Total Protein: 5.1 g/dL — ABNORMAL LOW (ref 6.5–8.1)

## 2021-11-17 LAB — CBC
HCT: 28.4 % — ABNORMAL LOW (ref 39.0–52.0)
Hemoglobin: 9.7 g/dL — ABNORMAL LOW (ref 13.0–17.0)
MCH: 36.1 pg — ABNORMAL HIGH (ref 26.0–34.0)
MCHC: 34.2 g/dL (ref 30.0–36.0)
MCV: 105.6 fL — ABNORMAL HIGH (ref 80.0–100.0)
Platelets: 144 10*3/uL — ABNORMAL LOW (ref 150–400)
RBC: 2.69 MIL/uL — ABNORMAL LOW (ref 4.22–5.81)
RDW: 13 % (ref 11.5–15.5)
WBC: 7.6 10*3/uL (ref 4.0–10.5)
nRBC: 0 % (ref 0.0–0.2)

## 2021-11-17 MED ORDER — DOCUSATE SODIUM 100 MG PO CAPS
100.0000 mg | ORAL_CAPSULE | Freq: Two times a day (BID) | ORAL | Status: DC
  Administered 2021-11-17 – 2021-11-27 (×18): 100 mg via ORAL
  Filled 2021-11-17 (×21): qty 1

## 2021-11-17 MED ORDER — HYDROMORPHONE 1 MG/ML IV SOLN
INTRAVENOUS | Status: DC
  Administered 2021-11-18: 1.5 mg via INTRAVENOUS
  Administered 2021-11-18: 3 mg via INTRAVENOUS

## 2021-11-17 MED ORDER — DIPHENHYDRAMINE HCL 50 MG/ML IJ SOLN: 12.5000 mg | Freq: Four times a day (QID) | INTRAMUSCULAR | Status: DC | PRN

## 2021-11-17 MED ORDER — BISACODYL 10 MG RE SUPP
10.0000 mg | Freq: Every day | RECTAL | Status: DC
  Administered 2021-11-17 – 2021-11-19 (×2): 10 mg via RECTAL
  Filled 2021-11-17 (×2): qty 1

## 2021-11-17 MED ORDER — NALOXONE HCL 0.4 MG/ML IJ SOLN: 0.4000 mg | INTRAMUSCULAR | Status: DC | PRN

## 2021-11-17 MED ORDER — HYDROMORPHONE HCL 1 MG/ML IJ SOLN
0.5000 mg | INTRAMUSCULAR | Status: DC | PRN
  Administered 2021-11-18 – 2021-11-21 (×8): 2 mg via INTRAVENOUS
  Administered 2021-11-22: 12:00:00 1 mg via INTRAVENOUS
  Administered 2021-11-22: 05:00:00 2 mg via INTRAVENOUS
  Administered 2021-11-22 – 2021-11-24 (×6): 1 mg via INTRAVENOUS
  Administered 2021-11-24: 11:00:00 2 mg via INTRAVENOUS
  Administered 2021-11-25 (×2): 1 mg via INTRAVENOUS
  Filled 2021-11-17: qty 2
  Filled 2021-11-17: qty 1
  Filled 2021-11-17: qty 2
  Filled 2021-11-17 (×2): qty 1
  Filled 2021-11-17: qty 2
  Filled 2021-11-17: qty 1
  Filled 2021-11-17: qty 2
  Filled 2021-11-17: qty 1
  Filled 2021-11-17 (×2): qty 2
  Filled 2021-11-17: qty 1
  Filled 2021-11-17 (×2): qty 2
  Filled 2021-11-17 (×2): qty 1
  Filled 2021-11-17 (×2): qty 2
  Filled 2021-11-17 (×3): qty 1

## 2021-11-17 MED ORDER — DIPHENHYDRAMINE HCL 12.5 MG/5ML PO ELIX
12.5000 mg | ORAL_SOLUTION | Freq: Four times a day (QID) | ORAL | Status: DC | PRN
  Filled 2021-11-17: qty 5

## 2021-11-17 MED ORDER — METHOCARBAMOL 500 MG PO TABS: 500.0000 mg | ORAL_TABLET | Freq: Four times a day (QID) | ORAL | Status: DC | PRN

## 2021-11-17 MED ORDER — SODIUM CHLORIDE 0.9% FLUSH: 9.0000 mL | INTRAVENOUS | Status: DC | PRN

## 2021-11-17 MED ORDER — HYDROMORPHONE 1 MG/ML IV SOLN: INTRAVENOUS | Status: DC

## 2021-11-17 MED ORDER — ONDANSETRON HCL 4 MG/2ML IJ SOLN: 4.0000 mg | Freq: Four times a day (QID) | INTRAMUSCULAR | Status: DC | PRN

## 2021-11-17 NOTE — Progress Notes (Signed)
5 Days Post-Op   Subjective/Chief Complaint: Feeling much more bloated today.  Passing some flatus, but not a lot.  No n/v.  Epidural pulled today.     Objective: Vital signs in last 24 hours: Temp:  [97.7 F (36.5 C)-98.9 F (37.2 C)] 98.3 F (36.8 C) (12/14 1200) Pulse Rate:  [70-91] 79 (12/14 1400) Resp:  [8-21] 13 (12/14 1400) BP: (105-153)/(69-95) 121/74 (12/14 1400) SpO2:  [93 %-100 %] 99 % (12/14 1400) FiO2 (%):  [0 %] 0 % (12/14 0400) Last BM Date:  (PTA)  Intake/Output from previous day: 12/13 0701 - 12/14 0700 In: 2369.7 [P.O.:600; I.V.:1249.9; IV Piggyback:345.5] Out: 6712 [Urine:1175; Drains:160] Intake/Output this shift: Total I/O In: 532 [P.O.:240; I.V.:249.9; Other:29.2; IV Piggyback:13] Out: 265 [Urine:200; Drains:65]  General appearance: alert, cooperative, and no distress Resp: breathing comfortably Cardio: regular rate and rhythm GI: soft, moderately distended, approp tender. Dressing c/d/I. Drains serosang Extremities: no edema, well perfused.   Lab Results:  Recent Labs    11/16/21 0635 11/17/21 0345  WBC 7.4 7.6  HGB 9.9* 9.7*  HCT 29.0* 28.4*  PLT 127* 144*   BMET Recent Labs    11/16/21 0635 11/17/21 0345  NA 132* 131*  K 3.7 3.5  CL 99 98  CO2 28 28  GLUCOSE 109* 106*  BUN 9 7  CREATININE 0.85 0.82  CALCIUM 8.6* 8.5*   PT/INR Recent Labs    11/15/21 0454  LABPROT 13.5  INR 1.0   ABG No results for input(s): PHART, HCO3 in the last 72 hours.  Invalid input(s): PCO2, PO2   Studies/Results: No results found.  Anti-infectives: Anti-infectives (From admission, onward)    Start     Dose/Rate Route Frequency Ordered Stop   11/12/21 1900  ceFAZolin (ANCEF) IVPB 2g/100 mL premix        2 g 200 mL/hr over 30 Minutes Intravenous Every 8 hours 11/12/21 1713 11/12/21 2013   11/12/21 0700  ceFAZolin (ANCEF) IVPB 2g/100 mL premix        2 g 200 mL/hr over 30 Minutes Intravenous On call to O.R. 11/12/21 0653 11/12/21 1330        Assessment/Plan: s/p Procedure(s): DIAGNOSTIC LAPAROSCOPY (N/A) WHIPPLE PROCEDURE (N/A)  Pathology pancreas cancer [id number]  Low dose PCA, po oxycodone. Anticipate d/c pca tomorrow unless n/v.   Stay on full liquids.   Suppository Transfer to floor    LOS: 5 days    Stark Klein 11/17/2021

## 2021-11-18 LAB — COMPREHENSIVE METABOLIC PANEL
ALT: 27 U/L (ref 0–44)
AST: 16 U/L (ref 15–41)
Albumin: 2.1 g/dL — ABNORMAL LOW (ref 3.5–5.0)
Alkaline Phosphatase: 232 U/L — ABNORMAL HIGH (ref 38–126)
Anion gap: 6 (ref 5–15)
BUN: 6 mg/dL (ref 6–20)
CO2: 28 mmol/L (ref 22–32)
Calcium: 8.7 mg/dL — ABNORMAL LOW (ref 8.9–10.3)
Chloride: 97 mmol/L — ABNORMAL LOW (ref 98–111)
Creatinine, Ser: 0.77 mg/dL (ref 0.61–1.24)
GFR, Estimated: >60 mL/min (ref >60)
Glucose, Bld: 101 mg/dL — ABNORMAL HIGH (ref 70–99)
Potassium: 4.3 mmol/L (ref 3.5–5.1)
Sodium: 131 mmol/L — ABNORMAL LOW (ref 135–145)
Total Bilirubin: 1.2 mg/dL (ref 0.3–1.2)
Total Protein: 5.6 g/dL — ABNORMAL LOW (ref 6.5–8.1)

## 2021-11-18 LAB — CBC
HCT: 30.3 % — ABNORMAL LOW (ref 39.0–52.0)
Hemoglobin: 10.2 g/dL — ABNORMAL LOW (ref 13.0–17.0)
MCH: 35.2 pg — ABNORMAL HIGH (ref 26.0–34.0)
MCHC: 33.7 g/dL (ref 30.0–36.0)
MCV: 104.5 fL — ABNORMAL HIGH (ref 80.0–100.0)
Platelets: 187 10*3/uL (ref 150–400)
RBC: 2.9 MIL/uL — ABNORMAL LOW (ref 4.22–5.81)
RDW: 13 % (ref 11.5–15.5)
WBC: 8.6 10*3/uL (ref 4.0–10.5)
nRBC: 0 % (ref 0.0–0.2)

## 2021-11-18 MED ORDER — ENOXAPARIN SODIUM 40 MG/0.4ML IJ SOSY
40.0000 mg | PREFILLED_SYRINGE | INTRAMUSCULAR | Status: DC
  Administered 2021-11-18 – 2021-11-26 (×9): 40 mg via SUBCUTANEOUS
  Filled 2021-11-18 (×10): qty 0.4

## 2021-11-18 NOTE — Progress Notes (Signed)
Physical Therapy Treatment Patient Details Name: Dean [name] MRN: [id number] DOB: 06-17-1966 Today's Date: 11/18/2021   History of Present Illness Patient is a 55 y/o male who presents s/p whipple procedure 11/12/21 secondary to Malignant biliary obstruction. PMH includes bipolar disorder.    PT Comments    Pt was seen for exercises after declining a walk due to pain and concern about his lines.  Pt was reassured that the review of ex's would be useful, and was provided a handout with same.  Pt has intended to get home with his step daughter assisting him to complete the rehab since he feels home therapy is not needed.  Given his medical issues PT will still recommend if pt reconsiders.  Follow up for PT goals as outlined and re-inforce safety and goals of mobility since follow up care is pt limited.   Recommendations for follow up therapy are one component of a multi-disciplinary discharge planning process, led by the attending physician.  Recommendations may be updated based on patient status, additional functional criteria and insurance authorization.  Follow Up Recommendations  Home health PT     Assistance Recommended at Discharge Intermittent Supervision/Assistance  Equipment Recommendations  Rolling walker (2 wheels)    Recommendations for Other Services       Precautions / Restrictions Precautions Precautions: Fall Precaution Comments: 2 drains, PCA, epidural Restrictions Weight Bearing Restrictions: No     Mobility  Bed Mobility               General bed mobility comments: declined OOB    Transfers                   General transfer comment: declined OOB    Ambulation/Gait                   Stairs             Wheelchair Mobility    Modified Rankin (Stroke Patients Only)       Balance                                            Cognition Arousal/Alertness: Awake/alert Behavior During Therapy:  WFL for tasks assessed/performed Overall Cognitive Status: Impaired/Different from baseline Area of Impairment: Awareness;Problem solving;Following commands                     Memory: Decreased recall of precautions;Decreased short-term memory Following Commands: Follows one step commands with increased time   Awareness: Intellectual Problem Solving: Slow processing;Requires verbal cues;Requires tactile cues General Comments: distracted by pain and feeling weak        Exercises General Exercises - Lower Extremity Ankle Circles/Pumps: AROM;5 reps Quad Sets: AROM;10 reps Gluteal Sets: AROM;10 reps Heel Slides: AROM;10 reps Hip ABduction/ADduction: AROM;10 reps    General Comments General comments (skin integrity, edema, etc.): pt is in bed with lines and O2, had just walked a longer trip in last day but declines to try due to pain and weakness.  Pt iswilling to do exercises with limited stress on abdominal area      Pertinent Vitals/Pain Pain Assessment: Faces Faces Pain Scale: Hurts little more Pain Location: abdomen Pain Descriptors / Indicators: Guarding Pain Intervention(s): Limited activity within patient's tolerance;Monitored during session;Premedicated before session;Repositioned    Home Living  Prior Function            PT Goals (current goals can now be found in the care plan section) Acute Rehab PT Goals Patient Stated Goal: become independent again    Frequency    Min 3X/week      PT Plan Current plan remains appropriate    Co-evaluation              AM-PAC PT "6 Clicks" Mobility   Outcome Measure  Help needed turning from your back to your side while in a flat bed without using bedrails?: A Little Help needed moving from lying on your back to sitting on the side of a flat bed without using bedrails?: A Little Help needed moving to and from a bed to a chair (including a wheelchair)?: A Little Help  needed standing up from a chair using your arms (e.g., wheelchair or bedside chair)?: A Little Help needed to walk in hospital room?: A Little Help needed climbing 3-5 steps with a railing? : A Little 6 Click Score: 18    End of Session Equipment Utilized During Treatment: Oxygen Activity Tolerance: Patient tolerated treatment well Patient left: in bed;with call bell/phone within reach;with bed alarm set Nurse Communication: Mobility status PT Visit Diagnosis: Difficulty in walking, not elsewhere classified (R26.2);Muscle weakness (generalized) (M62.81);Pain Pain - Right/Left:  (abdomen) Pain - part of body:  (abdomen)     Time: 9357-0177 PT Time Calculation (min) (ACUTE ONLY): 12 min  Charges:  $Therapeutic Exercise: 8-22 mins           Ramond Dial 11/18/2021, 4:58 PM  Mee Hives, PT PhD Acute Rehab Dept. Number: [place] and [place]

## 2021-11-18 NOTE — Progress Notes (Signed)
Initial Nutrition Assessment  DOCUMENTATION CODES:   Not applicable  INTERVENTION:   Advance diet as medically appropriate Encourage good PO intake Multivitamin w/ minerals daily Ensure Enlive po TID, each supplement provides 350 kcal and 20 grams of protein Diet education  NUTRITION DIAGNOSIS:   Increased nutrient needs related to post-op healing, cancer and cancer related treatments as evidenced by estimated needs.  GOAL:   Patient will meet greater than or equal to 90% of their needs  MONITOR:   PO intake, Supplement acceptance, Diet advancement  REASON FOR ASSESSMENT:   Consult Assessment of nutrition requirement/status, Calorie Count  ASSESSMENT:   55 y.o. male presented for surgery; pt planned to have a Whipple procedure w/ possible G-tube placement due to malignant biliary stricture. Pt admitted in November with acute pancreatitis No significant PMH.   12/09 - Diagnostic Laparoscopy s/p classical pancreaticoduodenectomy, pancreatic duct stent, and omental flap 12/11 - NGT clamping trials; diet advanced to clear liquid 12/12 - NGT removed 12/13 - diet advanced to full liquids 12/14 - transferred to floor  MD consulted RD for calorie count and assessment; expect pt to be on full liquids for a few more days due to ileus.  Pt with post-op ileus; denies any nausea or vomiting, no BM yet. Reports having a lot of gas. Pt endorses the feeling of fullness now and prior to surgery.   Pt reports that he had a hard time eating yesterday, he was just not hungry. Pt reports that prior to surgery he was eating well and trying to prepare the best he can for his surgery. Pt reports a typical intake of: Breakfast: yogurt and protein shake Lunch: beans and weenies and protein shake Afternoon: probiotic drink Dinner: "normal meal"  Pt reports that he really enjoys the Ensure shakes and would to continue them.   Pt reports his UBW is 173# and that he feels that he has gained  weight. Per EMR, pt has had 2.2% weight loss within ~1 and 1/2 months.   Provided pt with "Whipple Surgery Nutrition Therapy" handout and reviewed material. Pt expressed good understanding of the material. Pt with no other questions or concerns at this time.   Medications reviewed and include: Dulcolax, Colace, Protonix Labs reviewed:  - Sodium 131  NUTRITION - FOCUSED PHYSICAL EXAM:  Flowsheet Row Most Recent Value  Orbital Region Mild depletion  Upper Arm Region No depletion  Thoracic and Lumbar Region No depletion  Buccal Region Mild depletion  Temple Region No depletion  Clavicle Bone Region No depletion  Clavicle and Acromion Bone Region No depletion  Scapular Bone Region No depletion  Dorsal Hand No depletion  Patellar Region No depletion  Anterior Thigh Region No depletion  Posterior Calf Region No depletion  Edema (RD Assessment) None  Hair Reviewed  Eyes Reviewed  Mouth Reviewed  Skin Reviewed  Nails Reviewed       Diet Order:   Diet Order             Diet full liquid Room service appropriate? Yes; Fluid consistency: Thin  Diet effective now                   EDUCATION NEEDS:   Education needs have been addressed  Skin:  Skin Assessment: Skin Integrity Issues: Skin Integrity Issues:: Incisions Incisions: Abdomen  Last BM:  11/17/2021  Height:   Ht Readings from Last 1 Encounters:  11/12/21 5\' 11"  (1.803 m)    Weight:   Wt Readings from Last  1 Encounters:  11/12/21 78.5 kg    Ideal Body Weight:  78.2 kg  BMI:  Body mass index is 24.13 kg/m.  Estimated Nutritional Needs:   Kcal:  2100-2300  Protein:  105-120 grams  Fluid:  >/= 2.1 L    Corrin Sieling Louie Casa, RD, LDN Clinical Dietitian See [facility] for contact information.

## 2021-11-18 NOTE — TOC Initial Note (Addendum)
Transition of Care [facility]) - Initial/Assessment Note    Patient Details  Name: Dean [name] MRN: [id number] Date of Birth: 07/01/1966  Transition of [facility]) CM/SW Contact:    Marilu Favre, RN Phone Number: 11/18/2021, 11:49 AM  Clinical Narrative:                 Spoke to patient at bedside regarding recommendation for  HHPT/OT and walker pending progress.   Patient will be staying with daughter in law and has assistance at home , at present does not feel he needs home health PT/OT . He is requesting he is given exercises to do at home. He does want a walker for home. Ordered walker and 3 in 1   NCM messaged Ruth PT .   TOC will continue to follow.   Expected Discharge Plan: [place] Barriers to Discharge: Continued Medical Work up   Patient Goals and CMS Choice Patient states their goals for this hospitalization and ongoing recovery are:: to return to home CMS Medicare.gov Compare Post Acute Care list provided to:: Patient Choice offered to / list presented to : Patient  Expected Discharge Plan and Services Expected Discharge Plan: [place]   Discharge Planning Services: CM Consult Post Acute Care Choice: Home Health, Durable Medical Equipment Living arrangements for the past 2 months: Single Family Home                 DME Arranged: Walker rolling DME Agency: [employer] Date DME Agency Contacted: 11/18/21 Time DME Agency Contacted: 78 Representative spoke with at DME Agency: Freda Munro [place]: Patient Refused HH          Prior Living Arrangements/Services Living arrangements for the past 2 months: [place] Lives with:: Relatives Patient language and need for interpreter reviewed:: Yes Do you feel safe going back to the place where you live?: Yes      Need for Family Participation in Patient Care: Yes (Comment) Care giver support system in place?: Yes (comment)   Criminal Activity/Legal  Involvement Pertinent to Current Situation/Hospitalization: No - Comment as needed  Activities of Daily Living Home Assistive Devices/Equipment: Eyeglasses ADL Screening (condition at time of admission) Patient's cognitive ability adequate to safely complete daily activities?: Yes Is the patient deaf or have difficulty hearing?: No Does the patient have difficulty seeing, even when wearing glasses/contacts?: No Does the patient have difficulty concentrating, remembering, or making decisions?: No Patient able to express need for assistance with ADLs?: Yes Does the patient have difficulty dressing or bathing?: No Independently performs ADLs?: Yes (appropriate for developmental age) Does the patient have difficulty walking or climbing stairs?: No Weakness of Legs: None Weakness of Arms/Hands: None  Permission Sought/Granted   Permission granted to share information with : No              Emotional Assessment Appearance:: Appears stated age Attitude/Demeanor/Rapport: Engaged, Self-Confident Affect (typically observed): Apprehensive Orientation: : Oriented to Self, Oriented to Place, Oriented to  Time, Oriented to Situation Alcohol / Substance Use: Not Applicable Psych Involvement: No (comment)  Admission diagnosis:  Hx of malignant neoplasm of bile duct [Z85.09] Malignant biliary obstruction (HCC) [K83.1, C80.1] Patient Active Problem List   Diagnosis Date Noted   Hx of malignant neoplasm of bile duct 11/12/2021   Malignant biliary obstruction ([place]) 11/12/2021   Acute pancreatitis 10/05/2021   Hypothyroidism 10/05/2021   Bipolar disorder ([place]) 12/25/2018   PCP:  Maude Leriche, PA-C Pharmacy:  [id number] - [place], [place] - [address] [place] [address] Phone: [phone] Fax: [phone]     Social Determinants of Health (SDOH) Interventions    Readmission Risk Interventions No flowsheet  data found.

## 2021-11-18 NOTE — Progress Notes (Signed)
6 Days Post-Op   Subjective/Chief Complaint: Had stool yesterday after suppository.  Still bloated, however, and doesn't feel hungry yet.    Objective: Vital signs in last 24 hours: Temp:  [97.8 F (36.6 C)-98.6 F (37 C)] 98.2 F (36.8 C) (12/15 0459) Pulse Rate:  [75-94] 83 (12/15 0459) Resp:  [8-19] 18 (12/15 0822) BP: (121-149)/(72-92) 144/81 (12/15 0459) SpO2:  [89 %-100 %] 99 % (12/15 0822) FiO2 (%):  [0 %] 0 % (12/15 0420) Last BM Date: 11/17/21  Intake/Output from previous day: 12/14 0701 - 12/15 0700 In: 769.8 [P.O.:240; I.V.:487.6; IV Piggyback:13] Out: 750 [Urine:455; Drains:295] Intake/Output this shift: No intake/output data recorded.  General appearance: alert, cooperative, and no distress Resp: breathing comfortably GI: soft, mildly distended, approp tender. Dressing c/d/I. Drains serosang Extremities: no edema, well perfused.   Lab Results:  Recent Labs    11/17/21 0345 11/18/21 0238  WBC 7.6 8.6  HGB 9.7* 10.2*  HCT 28.4* 30.3*  PLT 144* 187   BMET Recent Labs    11/17/21 0345 11/18/21 0238  NA 131* 131*  K 3.5 4.3  CL 98 97*  CO2 28 28  GLUCOSE 106* 101*  BUN 7 6  CREATININE 0.82 0.77  CALCIUM 8.5* 8.7*   PT/INR No results for input(s): LABPROT, INR in the last 72 hours.  ABG No results for input(s): PHART, HCO3 in the last 72 hours.  Invalid input(s): PCO2, PO2   Studies/Results: No results found.  Anti-infectives: Anti-infectives (From admission, onward)    Start     Dose/Rate Route Frequency Ordered Stop   11/12/21 1900  ceFAZolin (ANCEF) IVPB 2g/100 mL premix        2 g 200 mL/hr over 30 Minutes Intravenous Every 8 hours 11/12/21 1713 11/12/21 2013   11/12/21 0700  ceFAZolin (ANCEF) IVPB 2g/100 mL premix        2 g 200 mL/hr over 30 Minutes Intravenous On call to O.R. 11/12/21 0653 11/12/21 1330       Assessment/Plan: s/p Procedure(s): DIAGNOSTIC LAPAROSCOPY (N/A) WHIPPLE PROCEDURE (N/A)  Pathology pancreas  cancer pT1cN1M0  Will d/c medial drain as the output has come down significantly.   D/c PCA today.  Prn oxy, robaxin, and breakthrough IV dilaudid  Stay on full liquids given distention.   Ambulating well. Currently HH PT and OT recommended.  Will see how mobility is over the next few days.  Don't anticipate him being able to go home for several more days.    Mild hyponatremia - kvo IVF T bili down to normal ABL anemia stable.     LOS: 6 days    Stark Klein 11/18/2021

## 2021-11-19 LAB — CBC
HCT: 31 % — ABNORMAL LOW (ref 39.0–52.0)
Hemoglobin: 10.2 g/dL — ABNORMAL LOW (ref 13.0–17.0)
MCH: 34.6 pg — ABNORMAL HIGH (ref 26.0–34.0)
MCHC: 32.9 g/dL (ref 30.0–36.0)
MCV: 105.1 fL — ABNORMAL HIGH (ref 80.0–100.0)
Platelets: 200 10*3/uL (ref 150–400)
RBC: 2.95 MIL/uL — ABNORMAL LOW (ref 4.22–5.81)
RDW: 13.2 % (ref 11.5–15.5)
WBC: 9 10*3/uL (ref 4.0–10.5)
nRBC: 0 % (ref 0.0–0.2)

## 2021-11-19 LAB — COMPREHENSIVE METABOLIC PANEL
ALT: 26 U/L (ref 0–44)
AST: 21 U/L (ref 15–41)
Albumin: 2 g/dL — ABNORMAL LOW (ref 3.5–5.0)
Alkaline Phosphatase: 282 U/L — ABNORMAL HIGH (ref 38–126)
Anion gap: 9 (ref 5–15)
BUN: 8 mg/dL (ref 6–20)
CO2: 27 mmol/L (ref 22–32)
Calcium: 8.7 mg/dL — ABNORMAL LOW (ref 8.9–10.3)
Chloride: 96 mmol/L — ABNORMAL LOW (ref 98–111)
Creatinine, Ser: 0.78 mg/dL (ref 0.61–1.24)
GFR, Estimated: >60 mL/min (ref >60)
Glucose, Bld: 99 mg/dL (ref 70–99)
Potassium: 4.2 mmol/L (ref 3.5–5.1)
Sodium: 132 mmol/L — ABNORMAL LOW (ref 135–145)
Total Bilirubin: 1.4 mg/dL — ABNORMAL HIGH (ref 0.3–1.2)
Total Protein: 5.8 g/dL — ABNORMAL LOW (ref 6.5–8.1)

## 2021-11-19 MED ORDER — ADULT MULTIVITAMIN W/MINERALS CH
1.0000 | ORAL_TABLET | Freq: Every day | ORAL | Status: DC
  Administered 2021-11-19 – 2021-11-27 (×9): 1 via ORAL
  Filled 2021-11-19 (×9): qty 1

## 2021-11-19 MED ORDER — GABAPENTIN 100 MG PO CAPS
100.0000 mg | ORAL_CAPSULE | Freq: Two times a day (BID) | ORAL | Status: DC
  Administered 2021-11-19 – 2021-11-27 (×17): 100 mg via ORAL
  Filled 2021-11-19 (×17): qty 1

## 2021-11-19 MED ORDER — BISACODYL 5 MG PO TBEC
5.0000 mg | DELAYED_RELEASE_TABLET | Freq: Every day | ORAL | Status: AC
  Administered 2021-11-19 – 2021-11-21 (×3): 5 mg via ORAL
  Filled 2021-11-19 (×3): qty 1

## 2021-11-19 MED ORDER — ENSURE ENLIVE PO LIQD
237.0000 mL | Freq: Three times a day (TID) | ORAL | Status: DC
  Administered 2021-11-19 – 2021-11-27 (×20): 237 mL via ORAL

## 2021-11-19 MED ORDER — METOCLOPRAMIDE HCL 5 MG/ML IJ SOLN
5.0000 mg | Freq: Three times a day (TID) | INTRAMUSCULAR | Status: DC
  Administered 2021-11-19 – 2021-11-20 (×3): 5 mg via INTRAVENOUS
  Filled 2021-11-19 (×3): qty 2

## 2021-11-19 NOTE — Progress Notes (Signed)
Occupational Therapy Treatment Patient Details Name: Dean [name] MRN: [id number] DOB: 05/23/1966 Today's Date: 11/19/2021   History of present illness Patient is a 55 y/o male who presents s/p whipple procedure 11/12/21 secondary to Malignant biliary obstruction. PMH includes bipolar disorder.   OT comments  Pt. Seen for skilled OT treatment session.  Able to complete bed mobility, and transfer to recliner min guard a.  Rn present during session. Pt. Requesting to attempt to wean off of o2. Pt. Able to maintain 92% on RA during session.  Pt. Very motivated and eager for participation even with c/o 7/10 pain. Encouragement needed for rest breaks and pacing activity.  Current d/c recommendations remain appropriate.     Recommendations for follow up therapy are one component of a multi-disciplinary discharge planning process, led by the attending physician.  Recommendations may be updated based on patient status, additional functional criteria and insurance authorization.    Follow Up Recommendations  Home health OT    Assistance Recommended at Discharge    Equipment Recommendations  BSC/3in1    Recommendations for Other Services      Precautions / Restrictions Precautions Precautions: Fall Precaution Comments: 1 drain on R Restrictions Weight Bearing Restrictions: No       Mobility Bed Mobility Overal bed mobility: Needs Assistance Bed Mobility: Supine to Sit     Supine to sit: Min guard     General bed mobility comments: light trunk support when transitioning into sitting, no other physical assistance required    Transfers Overall transfer level: Needs assistance Equipment used: 1 person hand held assist Transfers: Sit to/from Stand;Bed to chair/wheelchair/BSC Sit to Stand: Min guard Stand pivot transfers: Min guard         General transfer comment: close guard for safety, proper hand placement during rising/sitting     Balance Overall balance  assessment: Needs assistance Sitting-balance support: Feet supported;Bilateral upper extremity supported Sitting balance-Leahy Scale: Fair     Standing balance support: During functional activity;Reliant on assistive device for balance Standing balance-Leahy Scale: Poor                             ADL either performed or assessed with clinical judgement   ADL Overall ADL's : Needs assistance/impaired                         Toilet Transfer: Minimal assistance;Stand-pivot Toilet Transfer Details (indicate cue type and reason): observed during transfer from eob to recliner( approx. 3 pivotal steps). also requested urinal use seated Toileting- Clothing Manipulation and Hygiene: Set up;Sitting/lateral lean       Functional mobility during ADLs: Min guard General ADL Comments: very motivated to participate even with 7/10 pain states "thats all"? at end of session.    Extremity/Trunk Assessment              Vision       Perception     Praxis      Cognition Arousal/Alertness: Awake/alert Behavior During Therapy: WFL for tasks assessed/performed Overall Cognitive Status: Within Functional Limits for tasks assessed Area of Impairment: Problem solving                             Problem Solving: Slow processing General Comments: increased time to respond to subjective questions, pt states this is due to pain  Exercises     Shoulder Instructions       General Comments      Pertinent Vitals/ Pain       Pain Assessment: 0-10 Pain Score: 7  Pain Location: abdomen Pain Descriptors / Indicators: Guarding;Grimacing;Sore;Other (Comment) (pt. reports he breaks up his sentences and can not complete full sentence when he is in pain) Pain Intervention(s): Limited activity within patient's tolerance;Monitored during session;Repositioned;Relaxation;Other (comment) (educated on pt. letting people know when he is not in a place where he  can talk per his disclosure of not being able to talk well when in pain. reviewed taking the time to breathe and work through the pain. he verbalized understanding)  Home Living                                          Prior Functioning/Environment              Frequency  Min 2X/week        Progress Toward Goals  OT Goals(current goals can now be found in the care plan section)  Progress towards OT goals: Progressing toward goals     Plan Discharge plan remains appropriate    Co-evaluation                 AM-PAC OT "6 Clicks" Daily Activity     Outcome Measure   Help from another person eating meals?: A Little Help from another person taking care of personal grooming?: A Little Help from another person toileting, which includes using toliet, bedpan, or urinal?: A Little Help from another person bathing (including washing, rinsing, drying)?: A Little Help from another person to put on and taking off regular upper body clothing?: A Little Help from another person to put on and taking off regular lower body clothing?: A Little 6 Click Score: 18    End of Session Equipment Utilized During Treatment: Gait belt  OT Visit Diagnosis: Unsteadiness on feet (R26.81);Muscle weakness (generalized) (M62.81)   Activity Tolerance Patient tolerated treatment well   Patient Left in chair;with nursing/sitter in room;with call bell/phone within reach   Nurse Communication Other (comment) (pt. requesting to attempt therapy without o2, and see how he does on room air. rn agreeable and attached proper monitors to track pt. o2 on room air. pt. tolerated well 92% after transfer to chair)        Time: 9480-1655 OT Time Calculation (min): 20 min  Charges: OT General Charges $OT Visit: 1 Visit OT Treatments $Self Care/Home Management : 8-22 mins  Sonia Baller, COTA/L Acute Rehabilitation [phone]   Tanya Nones 11/19/2021, 11:16 AM

## 2021-11-19 NOTE — Progress Notes (Signed)
Physical Therapy Treatment Patient Details Name: Dean [name] MRN: [id number] DOB: 04/09/1966 Today's Date: 11/19/2021   History of Present Illness Patient is a 55 y/o male who presents s/p whipple procedure 11/12/21 secondary to Malignant biliary obstruction. PMH includes bipolar disorder.    PT Comments    Pt ambulatory for hallway distance today, cuing for upright posture as pt with abdominal guarding and pain throughout. Pt progressing well with OOB mobility, PT encouraged pt to perform LE exercises when he recovers from fatigue of ambulation (ankle pumps, LAQs in chair). PT to continue to follow.    Recommendations for follow up therapy are one component of a multi-disciplinary discharge planning process, led by the attending physician.  Recommendations may be updated based on patient status, additional functional criteria and insurance authorization.  Follow Up Recommendations  Home health PT     Assistance Recommended at Discharge Intermittent Supervision/Assistance  Equipment Recommendations  Rolling walker (2 wheels)    Recommendations for Other Services       Precautions / Restrictions Precautions Precautions: Fall Precaution Comments: 1 drain on R Restrictions Weight Bearing Restrictions: No     Mobility  Bed Mobility               General bed mobility comments: up in recliner    Transfers Overall transfer level: Needs assistance Equipment used: Rolling walker (2 wheels) Transfers: Sit to/from Stand Sit to Stand: Min guard           General transfer comment: close guard for safety, proper hand placement during rising/sitting    Ambulation/Gait Ambulation/Gait assistance: Min guard Gait Distance (Feet): 220 Feet Assistive device: Rolling walker (2 wheels) Gait Pattern/deviations: Step-through pattern;Decreased stride length;Trunk flexed Gait velocity: decr     General Gait Details: close guard for safety, verbal cuing for upright  posture to pt tolerance. Pt with increasing forward trunk flexion with fatigue and further distance ambulated   Stairs             Wheelchair Mobility    Modified Rankin (Stroke Patients Only)       Balance Overall balance assessment: Needs assistance Sitting-balance support: Feet supported;Bilateral upper extremity supported Sitting balance-Leahy Scale: Fair     Standing balance support: During functional activity;Reliant on assistive device for balance Standing balance-Leahy Scale: Poor                              Cognition Arousal/Alertness: Awake/alert Behavior During Therapy: WFL for tasks assessed/performed Overall Cognitive Status: Within Functional Limits for tasks assessed Area of Impairment: Problem solving                             Problem Solving: Slow processing General Comments: increased time to respond to subjective questions, pt states this is due to pain        Exercises      General Comments        Pertinent Vitals/Pain Pain Assessment: 0-10 Pain Score: 7  Pain Location: abdomen Pain Descriptors / Indicators: Guarding;Grimacing;Sore Pain Intervention(s): Limited activity within patient's tolerance;Monitored during session;Repositioned    Home Living                          Prior Function            PT Goals (current goals can now be found in the care plan section)  Acute Rehab PT Goals Patient Stated Goal: to return to independence PT Goal Formulation: With patient Time For Goal Achievement: 11/28/21 Potential to Achieve Goals: Good Progress towards PT goals: Progressing toward goals    Frequency    Min 3X/week      PT Plan Current plan remains appropriate    Co-evaluation              AM-PAC PT "6 Clicks" Mobility   Outcome Measure  Help needed turning from your back to your side while in a flat bed without using bedrails?: A Little Help needed moving from lying on your  back to sitting on the side of a flat bed without using bedrails?: A Little Help needed moving to and from a bed to a chair (including a wheelchair)?: A Little Help needed standing up from a chair using your arms (e.g., wheelchair or bedside chair)?: A Little Help needed to walk in hospital room?: A Little Help needed climbing 3-5 steps with a railing? : A Little 6 Click Score: 18    End of Session   Activity Tolerance: Patient tolerated treatment well Patient left: with call bell/phone within reach;in chair (pt verbalizes he will wait for assist prior to mobilizing) Nurse Communication: Mobility status PT Visit Diagnosis: Difficulty in walking, not elsewhere classified (R26.2);Muscle weakness (generalized) (M62.81);Pain Pain - Right/Left:  (abdomen) Pain - part of body:  (abdomen)     Time: 6720-9470 PT Time Calculation (min) (ACUTE ONLY): 23 min  Charges:  $Gait Training: 8-22 mins                     Stacie Glaze, PT DPT Acute Rehabilitation Services Pager [phone]  Office [phone]   [place] 11/19/2021, 9:57 AM

## 2021-11-19 NOTE — Plan of Care (Signed)
  Problem: Coping: Goal: Level of anxiety will decrease Outcome: Progressing   Problem: Elimination: Goal: Will not experience complications related to urinary retention Outcome: Progressing   Problem: Pain Managment: Goal: General experience of comfort will improve Outcome: Progressing   

## 2021-11-19 NOTE — Progress Notes (Signed)
° °  7 Days Post-Op   Subjective/Chief Complaint: Medial drain out yesterday. Has had another BM, but still is bloated.    Objective: Vital signs in last 24 hours: Temp:  [97.6 F (36.4 C)-99 F (37.2 C)] 97.9 F (36.6 C) (12/16 0731) Pulse Rate:  [81-104] 81 (12/16 0731) Resp:  [16-22] 16 (12/16 0731) BP: (127-137)/(75-85) 134/78 (12/16 0731) SpO2:  [98 %-100 %] 100 % (12/16 0731) Last BM Date: 11/17/21  Intake/Output from previous day: 12/15 0701 - 12/16 0700 In: 1199 [P.O.:580; I.V.:619] Out: 825 [Urine:665; Drains:160] Intake/Output this shift: Total I/O In: 240 [P.O.:240] Out: 10 [Drains:10]  General appearance: alert, cooperative, and no distress Resp: breathing comfortably GI: soft, mildly distended, approp tender. Dressing c/d/I. Remaining drain sl murky today.   Extremities: no edema, well perfused.   Lab Results:  Recent Labs    11/18/21 0238 11/19/21 0210  WBC 8.6 9.0  HGB 10.2* 10.2*  HCT 30.3* 31.0*  PLT 187 200   BMET Recent Labs    11/18/21 0238 11/19/21 0210  NA 131* 132*  K 4.3 4.2  CL 97* 96*  CO2 28 27  GLUCOSE 101* 99  BUN 6 8  CREATININE 0.77 0.78  CALCIUM 8.7* 8.7*   PT/INR No results for input(s): LABPROT, INR in the last 72 hours.  ABG No results for input(s): PHART, HCO3 in the last 72 hours.  Invalid input(s): PCO2, PO2   Studies/Results: No results found.  Anti-infectives: Anti-infectives (From admission, onward)    Start     Dose/Rate Route Frequency Ordered Stop   11/12/21 1900  ceFAZolin (ANCEF) IVPB 2g/100 mL premix        2 g 200 mL/hr over 30 Minutes Intravenous Every 8 hours 11/12/21 1713 11/12/21 2013   11/12/21 0700  ceFAZolin (ANCEF) IVPB 2g/100 mL premix        2 g 200 mL/hr over 30 Minutes Intravenous On call to O.R. 11/12/21 0653 11/12/21 1330       Assessment/Plan: s/p Procedure(s): DIAGNOSTIC LAPAROSCOPY (N/A) WHIPPLE PROCEDURE (N/A)  Pathology pancreas cancer pT1cN1M0  Prn oxy, robaxin,  and breakthrough IV dilaudid  Stay on full liquids given distention.   Will try adding reglan.   Ambulating well. Pulmonary toilet. Currently HH PT and OT recommended.  Will see how mobility is over the next few days.  Don't anticipate him being able to go home for several more days.   Drain sl murky today which is new.  If WBCs spike/temp/HR, will start empiric antibiotics.    Mild hyponatremia - kvo IVF ABL anemia stable.     LOS: 7 days    Stark Klein 11/19/2021

## 2021-11-20 MED ORDER — CEFTRIAXONE SODIUM 2 G IJ SOLR
2.0000 g | INTRAMUSCULAR | Status: DC
  Administered 2021-11-20 – 2021-11-24 (×5): 2 g via INTRAVENOUS
  Filled 2021-11-20 (×5): qty 20

## 2021-11-20 MED ORDER — METOCLOPRAMIDE HCL 5 MG/ML IJ SOLN
5.0000 mg | Freq: Four times a day (QID) | INTRAMUSCULAR | Status: DC
  Administered 2021-11-20 – 2021-11-24 (×16): 5 mg via INTRAVENOUS
  Filled 2021-11-20 (×16): qty 2

## 2021-11-20 NOTE — Progress Notes (Signed)
8 Days Post-Op   Subjective/Chief Complaint: Minimal drain output Still feeling bloated   Objective: Vital signs in last 24 hours: Temp:  [97.5 F (36.4 C)-98.3 F (36.8 C)] 97.9 F (36.6 C) (12/17 0739) Pulse Rate:  [75-89] 75 (12/17 0739) Resp:  [16-18] 18 (12/17 0739) BP: (131-138)/(75-84) 133/75 (12/17 0739) SpO2:  [96 %-100 %] 96 % (12/17 0739) Last BM Date: 11/17/21  Intake/Output from previous day: 12/16 0701 - 12/17 0700 In: 966 [P.O.:600; I.V.:366] Out: 230 [Urine:200; Drains:30] Intake/Output this shift: Total I/O In: 240 [P.O.:240] Out: -   General appearance: alert, cooperative, and no distress Resp: breathing comfortably GI: soft, mildly distended, approp tender. Dressing c/d/I. Remaining drain with thin milky drainage.   Extremities: no edema, well perfused.   Lab Results:  Recent Labs    11/18/21 0238 11/19/21 0210  WBC 8.6 9.0  HGB 10.2* 10.2*  HCT 30.3* 31.0*  PLT 187 200   BMET Recent Labs    11/18/21 0238 11/19/21 0210  NA 131* 132*  K 4.3 4.2  CL 97* 96*  CO2 28 27  GLUCOSE 101* 99  BUN 6 8  CREATININE 0.77 0.78  CALCIUM 8.7* 8.7*   PT/INR No results for input(s): LABPROT, INR in the last 72 hours. ABG No results for input(s): PHART, HCO3 in the last 72 hours.  Invalid input(s): PCO2, PO2  Studies/Results: No results found.  Anti-infectives: Anti-infectives (From admission, onward)    Start     Dose/Rate Route Frequency Ordered Stop   11/12/21 1900  ceFAZolin (ANCEF) IVPB 2g/100 mL premix        2 g 200 mL/hr over 30 Minutes Intravenous Every 8 hours 11/12/21 1713 11/12/21 2013   11/12/21 0700  ceFAZolin (ANCEF) IVPB 2g/100 mL premix        2 g 200 mL/hr over 30 Minutes Intravenous On call to O.R. 11/12/21 0653 11/12/21 1330       Assessment/Plan: s/p Procedure(s): DIAGNOSTIC LAPAROSCOPY (N/A) WHIPPLE PROCEDURE (N/A)   Pathology pancreas cancer pT1cN1M0   Prn oxy, robaxin, and breakthrough IV dilaudid   Stay  on full liquids given distention.   Will try adding reglan.   Ambulating well. Pulmonary toilet. Currently HH PT and OT recommended.  Will see how mobility is over the next few days.  Don't anticipate him being able to go home for several more days.   Drain output is concerning - will restart antibiotics   Mild hyponatremia - kvo IVF ABL anemia stable.      LOS: 8 days    Maia Petties 11/20/2021

## 2021-11-20 NOTE — Progress Notes (Signed)
Physical Therapy Treatment Patient Details Name: Dean [name] MRN: [id number] DOB: 09/09/1966 Today's Date: 11/20/2021   History of Present Illness Patient is a 55 y/o male who presents s/p whipple procedure 11/12/21 secondary to Malignant biliary obstruction. PMH includes bipolar disorder.    PT Comments    Pt is progressing well towards goals. He increased ambulation distance and negotiated steps. Plan to work on gait velocity next session. Will continue to follow acutely.    Recommendations for follow up therapy are one component of a multi-disciplinary discharge planning process, led by the attending physician.  Recommendations may be updated based on patient status, additional functional criteria and insurance authorization.  Follow Up Recommendations  Home health PT     Assistance Recommended at Discharge Intermittent Supervision/Assistance  Equipment Recommendations  Rolling walker (2 wheels)    Recommendations for Other Services       Precautions / Restrictions Precautions Precautions: Fall Precaution Comments: 1 drain on R     Mobility  Bed Mobility Overal bed mobility: Needs Assistance Bed Mobility: Supine to Sit     Supine to sit: Supervision     General bed mobility comments: Increased time and effort with bed flat. Supervision for safety and lines.    Transfers Overall transfer level: Needs assistance Equipment used: Rolling walker (2 wheels) Transfers: Sit to/from Stand Sit to Stand: Min guard           General transfer comment: min guard for safety. Cues for hand placement with RW    Ambulation/Gait Ambulation/Gait assistance: Min guard;Supervision Gait Distance (Feet): 350 Feet Assistive device: Rolling walker (2 wheels) Gait Pattern/deviations: Step-through pattern;Decreased stride length;Trunk flexed Gait velocity: decr Gait velocity interpretation: <1.8 ft/sec, indicate of risk for recurrent falls   General Gait Details: Min  guard progressing to supervision for safety. Cues for postural control and RW proximity.Pt with significantly decreased gait speed.   Stairs Stairs: Yes Stairs assistance: Min guard Stair Management: One rail Left;Forwards;Alternating pattern;Step to pattern Number of Stairs: 4 General stair comments: pt able to ascend steps with alternating pattern and descend with step to. One hand on rail as pt reports he had a window ledge to hold onto at home.   Wheelchair Mobility    Modified Rankin (Stroke Patients Only)       Balance Overall balance assessment: Needs assistance Sitting-balance support: Feet supported;Bilateral upper extremity supported Sitting balance-Leahy Scale: Fair     Standing balance support: During functional activity;Reliant on assistive device for balance Standing balance-Leahy Scale: Poor Standing balance comment: reliant on UE support for stability. Can maintain static stance without support                            Cognition Arousal/Alertness: Awake/alert Behavior During Therapy: Flat affect;WFL for tasks assessed/performed Overall Cognitive Status: Within Functional Limits for tasks assessed                                 General Comments: increased time to respond to subjective questions, but demonstrated good problem solving during session.        Exercises      General Comments        Pertinent Vitals/Pain Pain Assessment: Faces Faces Pain Scale: Hurts a little bit Pain Location: abdomen Pain Descriptors / Indicators: Guarding;Sore;Other (Comment) (pt. reports he breaks up his sentences and can not complete full sentence when he is  in pain) Pain Intervention(s): Monitored during session;Limited activity within patient's tolerance;Repositioned    Home Living                          Prior Function            PT Goals (current goals can now be found in the care plan section) Acute Rehab PT  Goals Patient Stated Goal: to return to independence PT Goal Formulation: With patient Time For Goal Achievement: 11/28/21 Potential to Achieve Goals: Good Progress towards PT goals: Progressing toward goals    Frequency    Min 3X/week      PT Plan Current plan remains appropriate    Co-evaluation              AM-PAC PT "6 Clicks" Mobility   Outcome Measure  Help needed turning from your back to your side while in a flat bed without using bedrails?: A Little Help needed moving from lying on your back to sitting on the side of a flat bed without using bedrails?: A Little Help needed moving to and from a bed to a chair (including a wheelchair)?: A Little Help needed standing up from a chair using your arms (e.g., wheelchair or bedside chair)?: A Little Help needed to walk in hospital room?: A Little Help needed climbing 3-5 steps with a railing? : A Little 6 Click Score: 18    End of Session Equipment Utilized During Treatment: Gait belt (at axilla) Activity Tolerance: Patient tolerated treatment well Patient left: with call bell/phone within reach;in chair (pt verbalizes he will wait for assist prior to mobilizing) Nurse Communication: Mobility status PT Visit Diagnosis: Difficulty in walking, not elsewhere classified (R26.2);Muscle weakness (generalized) (M62.81);Pain Pain - Right/Left:  (abdomen) Pain - part of body:  (abdomen)     Time: 0160-1093 PT Time Calculation (min) (ACUTE ONLY): 30 min  Charges:  $Gait Training: 23-37 mins                    Benjiman Core, [place] Pager [phone] Acute Rehab  Allena Katz 11/20/2021, 11:49 AM

## 2021-11-21 ENCOUNTER — Inpatient Hospital Stay (HOSPITAL_COMMUNITY): Payer: BC Managed Care – PPO

## 2021-11-21 LAB — COMPREHENSIVE METABOLIC PANEL
ALT: 20 U/L (ref 0–44)
AST: 15 U/L (ref 15–41)
Albumin: 2 g/dL — ABNORMAL LOW (ref 3.5–5.0)
Alkaline Phosphatase: 247 U/L — ABNORMAL HIGH (ref 38–126)
Anion gap: 9 (ref 5–15)
BUN: 9 mg/dL (ref 6–20)
CO2: 27 mmol/L (ref 22–32)
Calcium: 8.8 mg/dL — ABNORMAL LOW (ref 8.9–10.3)
Chloride: 96 mmol/L — ABNORMAL LOW (ref 98–111)
Creatinine, Ser: 0.78 mg/dL (ref 0.61–1.24)
GFR, Estimated: >60 mL/min (ref >60)
Glucose, Bld: 123 mg/dL — ABNORMAL HIGH (ref 70–99)
Potassium: 4.1 mmol/L (ref 3.5–5.1)
Sodium: 132 mmol/L — ABNORMAL LOW (ref 135–145)
Total Bilirubin: 1.1 mg/dL (ref 0.3–1.2)
Total Protein: 5.6 g/dL — ABNORMAL LOW (ref 6.5–8.1)

## 2021-11-21 LAB — CBC
HCT: 36.6 % — ABNORMAL LOW (ref 39.0–52.0)
Hemoglobin: 12.4 g/dL — ABNORMAL LOW (ref 13.0–17.0)
MCH: 35.1 pg — ABNORMAL HIGH (ref 26.0–34.0)
MCHC: 33.9 g/dL (ref 30.0–36.0)
MCV: 103.7 fL — ABNORMAL HIGH (ref 80.0–100.0)
Platelets: 351 10*3/uL (ref 150–400)
RBC: 3.53 MIL/uL — ABNORMAL LOW (ref 4.22–5.81)
RDW: 13.4 % (ref 11.5–15.5)
WBC: 13 10*3/uL — ABNORMAL HIGH (ref 4.0–10.5)
nRBC: 0 % (ref 0.0–0.2)

## 2021-11-21 MED ORDER — IOHEXOL 300 MG/ML  SOLN
100.0000 mL | Freq: Once | INTRAMUSCULAR | Status: AC | PRN
  Administered 2021-11-21: 22:00:00 100 mL via INTRAVENOUS

## 2021-11-21 MED ORDER — IOHEXOL 9 MG/ML PO SOLN
ORAL | Status: AC
  Administered 2021-11-21 (×2): 500 mL
  Filled 2021-11-21: qty 1000

## 2021-11-21 NOTE — Progress Notes (Signed)
9 Days Post-Op   Subjective/Chief Complaint: Still quite distended, passing flatus but no BM yesterday No nausea with liquids WBC up to 13 Afebrile    Objective: Vital signs in last 24 hours: Temp:  [97.8 F (36.6 C)-98.4 F (36.9 C)] 97.8 F (36.6 C) (12/18 0828) Pulse Rate:  [88-103] 88 (12/18 0828) Resp:  [18-19] 19 (12/18 0828) BP: (122-140)/(86-91) 123/90 (12/18 0828) SpO2:  [95 %-98 %] 98 % (12/18 0828) Last BM Date: 11/17/21  Intake/Output from previous day: 12/17 0701 - 12/18 0700 In: 1654.4 [P.O.:1180; I.V.:364.1; IV Piggyback:110.3] Out: 250 [Urine:200; Drains:50] Intake/Output this shift: No intake/output data recorded.  General appearance: alert, cooperative, and no distress Resp: breathing comfortably GI: soft, mildly distended, approp tender. Dressing c/d/I. Remaining drain with moderate amount of tan milky drainage.   Extremities: no edema, well perfused.   Lab Results:  Recent Labs    11/19/21 0210 11/21/21 0056  WBC 9.0 13.0*  HGB 10.2* 12.4*  HCT 31.0* 36.6*  PLT 200 351   BMET Recent Labs    11/19/21 0210 11/21/21 0056  NA 132* 132*  K 4.2 4.1  CL 96* 96*  CO2 27 27  GLUCOSE 99 123*  BUN 8 9  CREATININE 0.78 0.78  CALCIUM 8.7* 8.8*   PT/INR No results for input(s): LABPROT, INR in the last 72 hours. ABG No results for input(s): PHART, HCO3 in the last 72 hours.  Invalid input(s): PCO2, PO2  Studies/Results: No results found.  Anti-infectives: Anti-infectives (From admission, onward)    Start     Dose/Rate Route Frequency Ordered Stop   11/20/21 1000  cefTRIAXone (ROCEPHIN) 2 g in sodium chloride 0.9 % 100 mL IVPB        2 g 200 mL/hr over 30 Minutes Intravenous Every 24 hours 11/20/21 0906     11/12/21 1900  ceFAZolin (ANCEF) IVPB 2g/100 mL premix        2 g 200 mL/hr over 30 Minutes Intravenous Every 8 hours 11/12/21 1713 11/12/21 2013   11/12/21 0700  ceFAZolin (ANCEF) IVPB 2g/100 mL premix        2 g 200 mL/hr over  30 Minutes Intravenous On call to O.R. 11/12/21 0653 11/12/21 1330       Assessment/Plan: s/p Procedure(s): DIAGNOSTIC LAPAROSCOPY (N/A) WHIPPLE PROCEDURE (N/A) Pathology pancreas cancer pT1cN1M0   Prn oxy, robaxin, and breakthrough IV dilaudid   Stay on full liquids given distention.   Will try adding reglan.   Ambulating well. Pulmonary toilet. Currently HH PT and OT recommended.  Will see how mobility is over the next few days.  Don't anticipate him being able to go home for several more days.   Drain output is concerning - back on Rocephin.  CT scan today due to WBC increasing - possible leak   Mild hyponatremia - kvo IVF ABL anemia stable.    LOS: 9 days    Maia Petties 11/21/2021

## 2021-11-21 NOTE — Plan of Care (Signed)
°  Problem: Clinical Measurements: Goal: Will remain free from infection Outcome: Progressing   Problem: Clinical Measurements: Goal: Diagnostic test results will improve Outcome: Progressing   Problem: Coping: Goal: Level of anxiety will decrease Outcome: Progressing

## 2021-11-22 ENCOUNTER — Ambulatory Visit: Payer: BC Managed Care – PPO | Admitting: Psychiatry

## 2021-11-22 NOTE — Progress Notes (Signed)
Occupational Therapy Treatment Patient Details Name: Dean [name] MRN: [id number] DOB: [birth year] Today's Date: 11/22/2021   History of present illness Patient is a 55 y/o male who presents s/p whipple procedure 11/12/21 secondary to Malignant biliary obstruction. PMH includes bipolar disorder.   OT comments  Pt steady with sit to stand and ambulation around room and to bathroom pushing IV pole. Completed toileting and standing grooming modified independently. Pt continues to have difficulty accessing feet for bathing and dressing due to abdominal distension and pain. Disoriented to time.    Recommendations for follow up therapy are one component of a multi-disciplinary discharge planning process, led by the attending physician.  Recommendations may be updated based on patient status, additional functional criteria and insurance authorization.    Follow Up Recommendations  Home health OT    Assistance Recommended at Discharge    Equipment Recommendations  BSC/3in1    Recommendations for Other Services      Precautions / Restrictions Precautions Precautions: Fall Precaution Comments: 1 drain on R       Mobility Bed Mobility               General bed mobility comments: seated at EOB upon arrival    Transfers Overall transfer level: Modified independent Equipment used: None               General transfer comment: pushed IV pole around room without LOB     Balance Overall balance assessment: Needs assistance   Sitting balance-Leahy Scale: Good       Standing balance-Leahy Scale: Good                             ADL either performed or assessed with clinical judgement   ADL Overall ADL's : Needs assistance/impaired Eating/Feeding: Independent Eating/Feeding Details (indicate cue type and reason): Ensure Grooming: Wash/dry hands;Wash/dry face;Oral care;Standing;Modified independent           Upper Body Dressing : Set up;Sitting    Lower Body Dressing: Minimal assistance;Sitting/lateral leans Lower Body Dressing Details (indicate cue type and reason): pt not quite able to cross foot over opposite knee to don sock, bending over is painful Toilet Transfer: Modified Independent   Toileting- Clothing Manipulation and Hygiene: Modified independent       Functional mobility during ADLs: Modified independent (pushes IV pole)      Extremity/Trunk Assessment              Vision       Perception     Praxis      Cognition Arousal/Alertness: Awake/alert Behavior During Therapy: WFL for tasks assessed/performed Overall Cognitive Status: Impaired/Different from baseline Area of Impairment: Orientation                 Orientation Level: Time                        Exercises     Shoulder Instructions       General Comments      Pertinent Vitals/ Pain       Pain Assessment: Faces Faces Pain Scale: Hurts a little bit Pain Location: abdomen Pain Descriptors / Indicators: Guarding;Sore  Home Living  Prior Functioning/Environment              Frequency  Min 2X/week        Progress Toward Goals  OT Goals(current goals can now be found in the care plan section)  Progress towards OT goals: Progressing toward goals  Acute Rehab OT Goals OT Goal Formulation: With patient Time For Goal Achievement: 11/30/21 Potential to Achieve Goals: Good  Plan Discharge plan remains appropriate    Co-evaluation                 AM-PAC OT "6 Clicks" Daily Activity     Outcome Measure   Help from another person eating meals?: None Help from another person taking care of personal grooming?: A Little Help from another person toileting, which includes using toliet, bedpan, or urinal?: None Help from another person bathing (including washing, rinsing, drying)?: None Help from another person to put on and taking off regular  upper body clothing?: A Little Help from another person to put on and taking off regular lower body clothing?: A Little 6 Click Score: 21    End of Session    OT Visit Diagnosis: Unsteadiness on feet (R26.81);Muscle weakness (generalized) (M62.81)   Activity Tolerance Patient tolerated treatment well   Patient Left in chair;with nursing/sitter in room;with call bell/phone within reach   Nurse Communication          Time: 1540-0867 OT Time Calculation (min): 25 min  Charges: OT General Charges $OT Visit: 1 Visit OT Treatments $Self Care/Home Management : 23-37 mins  Nestor Lewandowsky, OTR/L Acute Rehabilitation Services Pager: [phone] Office: [phone]   Malka So 11/22/2021, 9:26 AM

## 2021-11-22 NOTE — Progress Notes (Signed)
Progress Note  10 Days Post-Op  Subjective: Had a good BM yesterday and feeling less bloated this morning. Tolerating fulls without nausea or emesis. Pain well controlled. No voiding or respiratory complaints.  Objective: Vital signs in last 24 hours: Temp:  [97.8 F (36.6 C)-98 F (36.7 C)] 98 F (36.7 C) (12/19 0727) Pulse Rate:  [85-99] 85 (12/19 0727) Resp:  [16-19] 16 (12/19 0727) BP: (123-146)/(81-91) 131/81 (12/19 0727) SpO2:  [96 %-100 %] 96 % (12/19 0727) Last BM Date: 11/17/21  Intake/Output from previous day: 12/18 0701 - 12/19 0700 In: 241.6 [P.O.:240; I.V.:1.6] Out: 645 [Urine:600; Drains:45] Intake/Output this shift: No intake/output data recorded.  PE: General: pleasant, WD, male who is laying in bed in NAD HEENT: head is normocephalic, atraumatic. Mouth is pink and moist Heart: regular, rate, and rhythm. Palpable pedal pulses bilaterally Lungs: CTAB, no wheezes, rhonchi, or rales noted.  Respiratory effort nonlabored Abd: soft, +BS, mild appropriate TTP around incisions. Staples intact without erythema or discharge. Mild to moderate distension Drain with scant purulent fluid MSK: all 4 extremities are symmetrical with no cyanosis, clubbing, or edema. Calves nontender to palpation bilaterally Skin: warm and dry with no masses, lesions, or rashes Psych: A&Ox3 with an appropriate affect.    Lab Results:  Recent Labs    11/21/21 0056  WBC 13.0*  HGB 12.4*  HCT 36.6*  PLT 351   BMET Recent Labs    11/21/21 0056  NA 132*  K 4.1  CL 96*  CO2 27  GLUCOSE 123*  BUN 9  CREATININE 0.78  CALCIUM 8.8*   PT/INR No results for input(s): LABPROT, INR in the last 72 hours. CMP     Component Value Date/Time   NA 132 (L) 11/21/2021 0056   K 4.1 11/21/2021 0056   CL 96 (L) 11/21/2021 0056   CO2 27 11/21/2021 0056   GLUCOSE 123 (H) 11/21/2021 0056   BUN 9 11/21/2021 0056   CREATININE 0.78 11/21/2021 0056   CALCIUM 8.8 (L) 11/21/2021 0056   PROT  5.6 (L) 11/21/2021 0056   ALBUMIN 2.0 (L) 11/21/2021 0056   AST 15 11/21/2021 0056   ALT 20 11/21/2021 0056   ALKPHOS 247 (H) 11/21/2021 0056   BILITOT 1.1 11/21/2021 0056   GFRNONAA >60 11/21/2021 0056   GFRAA >60 08/19/2015 1925   Lipase     Component Value Date/Time   LIPASE 79 (H) 10/07/2021 0424       Studies/Results: CT ABDOMEN PELVIS W CONTRAST  Result Date: 11/21/2021 CLINICAL DATA:  Status post Whipple, possible leak. EXAM: CT ABDOMEN AND PELVIS WITH CONTRAST TECHNIQUE: Multidetector CT imaging of the abdomen and pelvis was performed using the standard protocol following bolus administration of intravenous contrast. CONTRAST:  140mL OMNIPAQUE IOHEXOL 300 MG/ML  SOLN COMPARISON:  CT abdomen and pelvis 10/18/2021. FINDINGS: Lower chest: There are new small bilateral pleural effusions with bilateral lower lobe atelectasis. Hepatobiliary: No focal liver lesions are identified. The gallbladder is now surgically absent. There has been interval resolution of intra and extrahepatic biliary ductal dilatation. There is new pneumobilia in the left lobe of the liver. Pancreas: Patient is status post Whipple procedure. Small stent is seen between the pancreatic body and adjacent small bowel. There is no pancreatic ductal dilatation. The pancreatic body and tail appear within normal limits. Spleen: Normal in size without focal abnormality. Adrenals/Urinary Tract: There is a small amount of air in the bladder. The kidneys and adrenal glands are within normal limits. Stomach/Bowel: Patient is status  post Whipple procedure. There is a small amount of free fluid and air in the surgical bed which appears within normal limits. Gastrojejunostomy appears within normal limits with some mild wall thickening of the jejunum at the anastomosis. There is a large air contrast level within the stomach. Left-sided small bowel loops are dilated measuring up to 3.7 cm. Oral contrast reaches the distal ileum.  Definitive transition point not visualized. The colon appears within normal limits. The appendix is not visualized. There is marked wall thickening of the duodenum with surrounding inflammatory compatible with recent surgery. There is an air-fluid collection abutting the pancreatic head and duodenum just inferior to the portacaval region measuring 2.7 by 3.1 x 4.0 cm image 3/33. Vascular/Lymphatic: Aortic atherosclerosis. No enlarged abdominal or pelvic lymph nodes. Reproductive: Prostate is unremarkable. Other: There is small volume ascites throughout the abdomen and pelvis. Percutaneous drainage catheter seen entering the right abdomen with distal portion in the surgical bed. The distal portion of the catheter abuts the previously mentioned fluid collection, but does not definitely enter the fluid collection. There is diffuse body wall edema. Midline skin staples are present. Musculoskeletal: There are degenerative changes at L5-S1. IMPRESSION: 1. Status post Whipple procedure with small amount of expected fluid and free air in the surgical bed. 2. Additionally, there is an air-fluid collection in the surgical bed measuring 2.7 x 3.1 x 4.0 cm, indeterminate. The percutaneous drainage catheter abuts the anterior margin of the collection, but does not definitely enter the collection. 3. Gastrojejunostomy appears patent. There is mild dilatation of the stomach and proximal small bowel favored as ileus. Oral contrast reaches distal ileum. 4. Cholecystectomy. New pneumobilia compatible with recent Whipple's procedure. 5. Remaining pancreatic body and tail appear within normal limits. 6. Small volume ascites. 7. Small pleural effusions and bibasilar atelectasis. 8. Body wall edema. 9. Small amount air in the bladder. Electronically Signed   By: Ronney Asters M.D.   On: 11/21/2021 23:21    Anti-infectives: Anti-infectives (From admission, onward)    Start     Dose/Rate Route Frequency Ordered Stop   11/20/21 1000   cefTRIAXone (ROCEPHIN) 2 g in sodium chloride 0.9 % 100 mL IVPB        2 g 200 mL/hr over 30 Minutes Intravenous Every 24 hours 11/20/21 0906     11/12/21 1900  ceFAZolin (ANCEF) IVPB 2g/100 mL premix        2 g 200 mL/hr over 30 Minutes Intravenous Every 8 hours 11/12/21 1713 11/12/21 2013   11/12/21 0700  ceFAZolin (ANCEF) IVPB 2g/100 mL premix        2 g 200 mL/hr over 30 Minutes Intravenous On call to O.R. 11/12/21 0653 11/12/21 1330        Assessment/Plan POD 11 s/p DIAGNOSTIC LAPAROSCOPY (N/A) WHIPPLE PROCEDURE - Dr. Barry Dienes 12/9 - Pathology pancreas cancer pT1cN1M0   Prn oxy, robaxin, and breakthrough IV dilaudid   - tolerating FLD with BM yesterday. Increase to soft but go slowly. reglan started 12/16.   - Ambulating well. Pulmonary toilet. - Currently HH PT and OT recommended.  Will see how mobility is over the next few days.  - CT scan 12/18: fluid collection in surgical bed - 2.7 x 3.1 x 4.0 cm, indeterminate. The percutaneous drainage catheter abuts the anterior margin of the collection and drain working well to drain this. Continue drain - 30 cc output over last 24h - WBC 15.2 this am, afebrile, continue to monitor    - Mild hyponatremia -  sodium chloride 1 g bid - ABL anemia stable   FEN: soft diet ID: rocephin 12/17 VTE: lovenox Dispo: HH PT/OT   LOS: 10 days    Winferd Humphrey, [facility] Surgery 11/22/2021, 7:55 AM Please see Amion for pager number during day hours 7:00am-4:30pm

## 2021-11-22 NOTE — Progress Notes (Signed)
Mobility Specialist Criteria Algorithm Info.   11/22/21 1500  Mobility  Activity Ambulated in hall  Range of Motion/Exercises Active;All extremities  Level of Assistance Independent after set-up  Assistive Device Other (Comment) (IV Pole)  Distance Ambulated (ft) 1100 ft  Mobility Ambulated with assistance in hallway  Mobility Response Tolerated well  Mobility performed by Mobility specialist  Bed Position Semi-fowlers   Patient ambulated in hallway using IV pole to steady. Tolerated ambulation well without complaint or incident. Was left lying supine in bed with all needs met.    11/22/2021 3:50 PM

## 2021-11-22 NOTE — Progress Notes (Signed)
Physical Therapy Treatment Patient Details Name: Dean [name] MRN: [id number] DOB: [birth year] Today's Date: 11/22/2021   History of Present Illness Patient is a 55 y/o male who presents s/p whipple procedure 11/12/21 secondary to Malignant biliary obstruction. PMH includes bipolar disorder.    PT Comments    Pt with improved activity tolerance for gait today, states abdominal pain is lessening. Pt also transitioned from RW to IV pole today, requiring less support for balance vs previous sessions. Pt requires intermittent cues for posture during mobility, but overall progressing well. PT to continue to follow.     Recommendations for follow up therapy are one component of a multi-disciplinary discharge planning process, led by the attending physician.  Recommendations may be updated based on patient status, additional functional criteria and insurance authorization.  Follow Up Recommendations  Home health PT     Assistance Recommended at Discharge Intermittent Supervision/Assistance  Equipment Recommendations  Rolling walker (2 wheels)    Recommendations for Other Services       Precautions / Restrictions Precautions Precautions: Fall Precaution Comments: 1 drain on R Restrictions Weight Bearing Restrictions: No     Mobility  Bed Mobility Overal bed mobility: Needs Assistance Bed Mobility: Supine to Sit;Sit to Supine     Supine to sit: Supervision Sit to supine: Supervision   General bed mobility comments: safety, increased time to perform    Transfers Overall transfer level: Modified independent Equipment used: None Transfers: Sit to/from Stand Sit to Stand: Modified independent (Device/Increase time)           General transfer comment: mod I for increased time to rise, pt reaching for IV pole to steady once standing    Ambulation/Gait Ambulation/Gait assistance: Supervision Gait Distance (Feet): 500 Feet Assistive device: IV Pole Gait  Pattern/deviations: Step-through pattern;Decreased stride length;Trunk flexed Gait velocity: decr     General Gait Details: cues for upright posture multiple times, worsens with fatigue but pt able to cue self by end of gait   Stairs             Wheelchair Mobility    Modified Rankin (Stroke Patients Only)       Balance Overall balance assessment: Needs assistance Sitting-balance support: Feet supported;Bilateral upper extremity supported Sitting balance-Leahy Scale: Fair     Standing balance support: During functional activity Standing balance-Leahy Scale: Fair                              Cognition Arousal/Alertness: Awake/alert Behavior During Therapy: WFL for tasks assessed/performed Overall Cognitive Status: Within Functional Limits for tasks assessed                                          Exercises      General Comments        Pertinent Vitals/Pain Pain Assessment: 0-10 Pain Score: 6  Pain Location: abdomen Pain Descriptors / Indicators: Guarding;Sore Pain Intervention(s): Monitored during session;Limited activity within patient's tolerance;Repositioned;Premedicated before session    Home Living                          Prior Function            PT Goals (current goals can now be found in the care plan section) Acute Rehab PT Goals Patient Stated Goal: to return to  independence PT Goal Formulation: With patient Time For Goal Achievement: 11/28/21 Potential to Achieve Goals: Good Progress towards PT goals: Progressing toward goals    Frequency    Min 3X/week      PT Plan Current plan remains appropriate    Co-evaluation              AM-PAC PT "6 Clicks" Mobility   Outcome Measure  Help needed turning from your back to your side while in a flat bed without using bedrails?: A Little Help needed moving from lying on your back to sitting on the side of a flat bed without using  bedrails?: A Little Help needed moving to and from a bed to a chair (including a wheelchair)?: A Little Help needed standing up from a chair using your arms (e.g., wheelchair or bedside chair)?: A Little Help needed to walk in hospital room?: A Little Help needed climbing 3-5 steps with a railing? : A Little 6 Click Score: 18    End of Session   Activity Tolerance: Patient tolerated treatment well Patient left: with call bell/phone within reach;in bed Nurse Communication: Mobility status PT Visit Diagnosis: Difficulty in walking, not elsewhere classified (R26.2);Muscle weakness (generalized) (M62.81);Pain Pain - Right/Left:  (abdomen) Pain - part of body:  (abdomen)     Time: 4696-2952 PT Time Calculation (min) (ACUTE ONLY): 15 min  Charges:  $Gait Training: 8-22 mins                    Stacie Glaze, PT DPT Acute Rehabilitation Services Pager [phone]  Office [phone]    [place] 11/22/2021, 1:46 PM

## 2021-11-22 NOTE — Progress Notes (Signed)
Patient ID: Dean [name], male   DOB: Oct 12, 1966, 55 y.o.   MRN: [id number] 10 Days Post-Op   Subjective: Feeling better, taking fulls well ROS negative except as listed above. Objective: Vital signs in last 24 hours: Temp:  [97.9 F (36.6 C)-98 F (36.7 C)] 98 F (36.7 C) (12/19 0727) Pulse Rate:  [85-99] 85 (12/19 0727) Resp:  [16-18] 16 (12/19 0727) BP: (131-146)/(81-91) 131/81 (12/19 0727) SpO2:  [96 %-100 %] 96 % (12/19 0727) Last BM Date: 11/17/21  Intake/Output from previous day: 12/18 0701 - 12/19 0700 In: 241.6 [P.O.:240; I.V.:1.6] Out: 645 [Urine:600; Drains:45] Intake/Output this shift: No intake/output data recorded.  General appearance: cooperative Resp: clear to auscultation bilaterally Cardio: regular rate and rhythm GI: soft, incision CDI, drain purulent but working well Extremities: extremities normal, atraumatic, no cyanosis or edema  Lab Results: CBC  Recent Labs    11/21/21 0056  WBC 13.0*  HGB 12.4*  HCT 36.6*  PLT 351   BMET Recent Labs    11/21/21 0056  NA 132*  K 4.1  CL 96*  CO2 27  GLUCOSE 123*  BUN 9  CREATININE 0.78  CALCIUM 8.8*   PT/INR No results for input(s): LABPROT, INR in the last 72 hours. ABG No results for input(s): PHART, HCO3 in the last 72 hours.  Invalid input(s): PCO2, PO2  Studies/Results: CT ABDOMEN PELVIS W CONTRAST  Result Date: 11/21/2021 CLINICAL DATA:  Status post Whipple, possible leak. EXAM: CT ABDOMEN AND PELVIS WITH CONTRAST TECHNIQUE: Multidetector CT imaging of the abdomen and pelvis was performed using the standard protocol following bolus administration of intravenous contrast. CONTRAST:  186mL OMNIPAQUE IOHEXOL 300 MG/ML  SOLN COMPARISON:  CT abdomen and pelvis 10/18/2021. FINDINGS: Lower chest: There are new small bilateral pleural effusions with bilateral lower lobe atelectasis. Hepatobiliary: No focal liver lesions are identified. The gallbladder is now surgically absent. There has  been interval resolution of intra and extrahepatic biliary ductal dilatation. There is new pneumobilia in the left lobe of the liver. Pancreas: Patient is status post Whipple procedure. Small stent is seen between the pancreatic body and adjacent small bowel. There is no pancreatic ductal dilatation. The pancreatic body and tail appear within normal limits. Spleen: Normal in size without focal abnormality. Adrenals/Urinary Tract: There is a small amount of air in the bladder. The kidneys and adrenal glands are within normal limits. Stomach/Bowel: Patient is status post Whipple procedure. There is a small amount of free fluid and air in the surgical bed which appears within normal limits. Gastrojejunostomy appears within normal limits with some mild wall thickening of the jejunum at the anastomosis. There is a large air contrast level within the stomach. Left-sided small bowel loops are dilated measuring up to 3.7 cm. Oral contrast reaches the distal ileum. Definitive transition point not visualized. The colon appears within normal limits. The appendix is not visualized. There is marked wall thickening of the duodenum with surrounding inflammatory compatible with recent surgery. There is an air-fluid collection abutting the pancreatic head and duodenum just inferior to the portacaval region measuring 2.7 by 3.1 x 4.0 cm image 3/33. Vascular/Lymphatic: Aortic atherosclerosis. No enlarged abdominal or pelvic lymph nodes. Reproductive: Prostate is unremarkable. Other: There is small volume ascites throughout the abdomen and pelvis. Percutaneous drainage catheter seen entering the right abdomen with distal portion in the surgical bed. The distal portion of the catheter abuts the previously mentioned fluid collection, but does not definitely enter the fluid collection. There is diffuse body wall edema.  Midline skin staples are present. Musculoskeletal: There are degenerative changes at L5-S1. IMPRESSION: 1. Status post  Whipple procedure with small amount of expected fluid and free air in the surgical bed. 2. Additionally, there is an air-fluid collection in the surgical bed measuring 2.7 x 3.1 x 4.0 cm, indeterminate. The percutaneous drainage catheter abuts the anterior margin of the collection, but does not definitely enter the collection. 3. Gastrojejunostomy appears patent. There is mild dilatation of the stomach and proximal small bowel favored as ileus. Oral contrast reaches distal ileum. 4. Cholecystectomy. New pneumobilia compatible with recent Whipple's procedure. 5. Remaining pancreatic body and tail appear within normal limits. 6. Small volume ascites. 7. Small pleural effusions and bibasilar atelectasis. 8. Body wall edema. 9. Small amount air in the bladder. Electronically Signed   By: Ronney Asters M.D.   On: 11/21/2021 23:21    Anti-infectives: Anti-infectives (From admission, onward)    Start     Dose/Rate Route Frequency Ordered Stop   11/20/21 1000  cefTRIAXone (ROCEPHIN) 2 g in sodium chloride 0.9 % 100 mL IVPB        2 g 200 mL/hr over 30 Minutes Intravenous Every 24 hours 11/20/21 0906     11/12/21 1900  ceFAZolin (ANCEF) IVPB 2g/100 mL premix        2 g 200 mL/hr over 30 Minutes Intravenous Every 8 hours 11/12/21 1713 11/12/21 2013   11/12/21 0700  ceFAZolin (ANCEF) IVPB 2g/100 mL premix        2 g 200 mL/hr over 30 Minutes Intravenous On call to O.R. 11/12/21 0653 11/12/21 1330       Assessment/Plan: s/p Procedure(s): DIAGNOSTIC LAPAROSCOPY (N/A) WHIPPLE PROCEDURE (N/A) Pathology pancreas cancer [id number]   Prn oxy, robaxin, and breakthrough IV dilaudid   Stay on full liquids given distention.    CT done 12/18 shows air-fluid collection in the surgical bed measuring 2.7 x 3.1 x 4.0 cm, indeterminate. The percutaneous drainage catheter abuts the anterior margin of the collection. Drain working well and I think it is draining this. I D/W Dr. Barry Dienes and she agrees.  Ambulating  well. Pulmonary toilet.  Currently HH PT and OT recommended.  Will see how mobility is over the next few days.   Labs in AM   Mild hyponatremia - kvo IVF ABL anemia stable.    LOS: 10 days    Georganna Skeans, MD, MPH, FACS Trauma & General Surgery Use [email or web address] to contact on call provider  11/22/2021

## 2021-11-23 LAB — CBC
HCT: 30.3 % — ABNORMAL LOW (ref 39.0–52.0)
Hemoglobin: 10.3 g/dL — ABNORMAL LOW (ref 13.0–17.0)
MCH: 35.5 pg — ABNORMAL HIGH (ref 26.0–34.0)
MCHC: 34 g/dL (ref 30.0–36.0)
MCV: 104.5 fL — ABNORMAL HIGH (ref 80.0–100.0)
Platelets: 322 10*3/uL (ref 150–400)
RBC: 2.9 MIL/uL — ABNORMAL LOW (ref 4.22–5.81)
RDW: 13.4 % (ref 11.5–15.5)
WBC: 15.2 10*3/uL — ABNORMAL HIGH (ref 4.0–10.5)
nRBC: 0 % (ref 0.0–0.2)

## 2021-11-23 LAB — BASIC METABOLIC PANEL
Anion gap: 5 (ref 5–15)
BUN: 6 mg/dL (ref 6–20)
CO2: 30 mmol/L (ref 22–32)
Calcium: 8.5 mg/dL — ABNORMAL LOW (ref 8.9–10.3)
Chloride: 96 mmol/L — ABNORMAL LOW (ref 98–111)
Creatinine, Ser: 0.8 mg/dL (ref 0.61–1.24)
GFR, Estimated: >60 mL/min (ref >60)
Glucose, Bld: 99 mg/dL (ref 70–99)
Potassium: 4.2 mmol/L (ref 3.5–5.1)
Sodium: 131 mmol/L — ABNORMAL LOW (ref 135–145)

## 2021-11-23 MED ORDER — SODIUM CHLORIDE 1 G PO TABS
1.0000 g | ORAL_TABLET | Freq: Two times a day (BID) | ORAL | Status: DC
  Administered 2021-11-23 (×2): 1 g via ORAL
  Filled 2021-11-23 (×3): qty 1

## 2021-11-23 NOTE — Plan of Care (Signed)
Pt received 2 doses of dilaudid this shift. And 1 dose of oxycodone. Pt lowest pain level reported this shift was 3. Vitals stable.  Problem: Education: Goal: Knowledge of General Education information will improve Description: Including pain rating scale, medication(s)/side effects and non-pharmacologic comfort measures Outcome: Progressing   Problem: Health Behavior/Discharge Planning: Goal: Ability to manage health-related needs will improve Outcome: Progressing   Problem: Clinical Measurements: Goal: Ability to maintain clinical measurements within normal limits will improve Outcome: Progressing Goal: Will remain free from infection Outcome: Progressing Goal: Diagnostic test results will improve Outcome: Progressing Goal: Respiratory complications will improve Outcome: Progressing Goal: Cardiovascular complication will be avoided Outcome: Progressing   Problem: Activity: Goal: Risk for activity intolerance will decrease Outcome: Progressing   Problem: Nutrition: Goal: Adequate nutrition will be maintained Outcome: Progressing   Problem: Coping: Goal: Level of anxiety will decrease Outcome: Progressing   Problem: Elimination: Goal: Will not experience complications related to bowel motility Outcome: Progressing Goal: Will not experience complications related to urinary retention Outcome: Progressing   Problem: Pain Managment: Goal: General experience of comfort will improve Outcome: Progressing   Problem: Safety: Goal: Ability to remain free from injury will improve Outcome: Progressing   Problem: Skin Integrity: Goal: Risk for impaired skin integrity will decrease Outcome: Progressing   Problem: Education: Goal: Will demonstrate proper wound care and an understanding of methods to prevent future damage Outcome: Progressing   Problem: Bowel/Gastric: Goal: Gastrointestinal status for postoperative course will improve Outcome: Progressing   Problem:  Nutritional: Goal: Will attain and maintain optimal nutritional status will improve Outcome: Progressing   Problem: Clinical Measurements: Goal: Postoperative complications will be avoided or minimized Outcome: Progressing   Problem: Respiratory: Goal: Will regain and/or maintain adequate ventilation Outcome: Progressing   Problem: Skin Integrity: Goal: Wound healing without signs and symptoms of infection will improve Outcome: Progressing Goal: Risk for impaired skin integrity will decrease Outcome: Progressing   Problem: Tissue Perfusion: Goal: Risk of venous thrombosis will decrease Outcome: Progressing   Problem: Urinary Elimination: Goal: Ability to achieve and maintain adequate urine output will improve Outcome: Progressing

## 2021-11-23 NOTE — Progress Notes (Signed)
Mobility Specialist Progress Note:   11/23/21 1145  Mobility  Activity Ambulated in hall  Level of Assistance Independent  Assistive Device Other (Comment) (IV Pole)  Distance Ambulated (ft) 1100 ft  Mobility Ambulated independently in hallway  Mobility Response Tolerated well  Mobility performed by Mobility specialist  $Mobility charge 1 Mobility   Pt asx during ambulation. Back in bed with all needs met.   Nelta Numbers Mobility Specialist  Phone [phone]

## 2021-11-24 ENCOUNTER — Other Ambulatory Visit: Payer: Self-pay

## 2021-11-24 ENCOUNTER — Encounter: Payer: Self-pay | Admitting: *Deleted

## 2021-11-24 DIAGNOSIS — C250 Malignant neoplasm of head of pancreas: Secondary | ICD-10-CM

## 2021-11-24 LAB — CBC
HCT: 28.9 % — ABNORMAL LOW (ref 39.0–52.0)
Hemoglobin: 10 g/dL — ABNORMAL LOW (ref 13.0–17.0)
MCH: 35.8 pg — ABNORMAL HIGH (ref 26.0–34.0)
MCHC: 34.6 g/dL (ref 30.0–36.0)
MCV: 103.6 fL — ABNORMAL HIGH (ref 80.0–100.0)
Platelets: 309 10*3/uL (ref 150–400)
RBC: 2.79 MIL/uL — ABNORMAL LOW (ref 4.22–5.81)
RDW: 13.6 % (ref 11.5–15.5)
WBC: 18.2 10*3/uL — ABNORMAL HIGH (ref 4.0–10.5)
nRBC: 0 % (ref 0.0–0.2)

## 2021-11-24 LAB — TYPE AND SCREEN
ABO/RH(D): O POS
Antibody Screen: NEGATIVE
Unit division: 0
Unit division: 0
Unit division: 0
Unit division: 0

## 2021-11-24 LAB — BASIC METABOLIC PANEL
Anion gap: 7 (ref 5–15)
BUN: 6 mg/dL (ref 6–20)
CO2: 25 mmol/L (ref 22–32)
Calcium: 8.3 mg/dL — ABNORMAL LOW (ref 8.9–10.3)
Chloride: 96 mmol/L — ABNORMAL LOW (ref 98–111)
Creatinine, Ser: 0.75 mg/dL (ref 0.61–1.24)
GFR, Estimated: >60 mL/min (ref >60)
Glucose, Bld: 104 mg/dL — ABNORMAL HIGH (ref 70–99)
Potassium: 3.9 mmol/L (ref 3.5–5.1)
Sodium: 128 mmol/L — ABNORMAL LOW (ref 135–145)

## 2021-11-24 LAB — BPAM RBC
Blood Product Expiration Date: 202301022359
Blood Product Expiration Date: 202301022359
Blood Product Expiration Date: 202301022359
Blood Product Expiration Date: 202301022359
ISSUE DATE / TIME: 202212091140
ISSUE DATE / TIME: 202212191734
Unit Type and Rh: 5100
Unit Type and Rh: 5100
Unit Type and Rh: 5100
Unit Type and Rh: 5100

## 2021-11-24 MED ORDER — SODIUM CHLORIDE 1 G PO TABS
2.0000 g | ORAL_TABLET | Freq: Two times a day (BID) | ORAL | Status: DC
Start: 2021-11-24 — End: 2021-11-27
  Administered 2021-11-24 – 2021-11-27 (×6): 2 g via ORAL
  Filled 2021-11-24 (×8): qty 2

## 2021-11-24 MED ORDER — PIPERACILLIN-TAZOBACTAM 3.375 G IVPB
3.3750 g | Freq: Three times a day (TID) | INTRAVENOUS | Status: DC
  Administered 2021-11-24 – 2021-11-27 (×9): 3.375 g via INTRAVENOUS
  Filled 2021-11-24 (×9): qty 50

## 2021-11-24 MED ORDER — VANCOMYCIN HCL 1250 MG/250ML IV SOLN
1250.0000 mg | Freq: Two times a day (BID) | INTRAVENOUS | Status: DC
  Administered 2021-11-25 – 2021-11-27 (×5): 1250 mg via INTRAVENOUS
  Filled 2021-11-24 (×6): qty 250

## 2021-11-24 MED ORDER — VANCOMYCIN HCL 1500 MG/300ML IV SOLN
1500.0000 mg | Freq: Once | INTRAVENOUS | Status: AC
  Administered 2021-11-24: 20:00:00 1500 mg via INTRAVENOUS
  Filled 2021-11-24: qty 300

## 2021-11-24 MED ORDER — ACETAMINOPHEN 325 MG PO TABS: 650.0000 mg | ORAL_TABLET | Freq: Four times a day (QID) | ORAL | Status: DC | PRN

## 2021-11-24 MED ORDER — FLUCONAZOLE IN SODIUM CHLORIDE 200-0.9 MG/100ML-% IV SOLN
200.0000 mg | Freq: Once | INTRAVENOUS | Status: AC
  Administered 2021-11-24: 15:00:00 200 mg via INTRAVENOUS
  Filled 2021-11-24: qty 100

## 2021-11-24 MED ORDER — FLUCONAZOLE 100MG IVPB
100.0000 mg | INTRAVENOUS | Status: DC
  Administered 2021-11-25 – 2021-11-26 (×2): 100 mg via INTRAVENOUS
  Filled 2021-11-24 (×3): qty 50

## 2021-11-24 MED ORDER — METOCLOPRAMIDE HCL 5 MG/ML IJ SOLN
5.0000 mg | Freq: Three times a day (TID) | INTRAMUSCULAR | Status: DC
Start: 2021-11-24 — End: 2021-11-25
  Administered 2021-11-24 – 2021-11-25 (×3): 5 mg via INTRAVENOUS
  Filled 2021-11-24 (×4): qty 2

## 2021-11-24 MED ORDER — PIPERACILLIN-TAZOBACTAM IN DEX 2-0.25 GM/50ML IV SOLN: 2.2500 g | Freq: Four times a day (QID) | INTRAVENOUS | Status: DC

## 2021-11-24 NOTE — Progress Notes (Signed)
Pharmacy Antibiotic Note  Dean [name] is a 55 y.o. male admitted on 11/12/2021 with  Empiric s/p Whipple procedure with leukocytosis .  Pharmacy has been consulted for Vanco dosing.  ID: Empiric for IAI s/p Whipple with leukocytosis. +drain.  - Tmax 99.1, WBC 18.2 rising. Scr <1  Zosyn 12/21>> Vanco 12/21>> Fluconazole 12/21>> rocephin 12/17-12/21  12/9: MRSA PCR: neg  Plan: IR to eval drain and drainage. Zosyn 3.375g IV q8hr Fluconazole 200mg  IV x 1 then 100mg /d Vanco 1500mg  IV x 1 then Vancomycin 1250 mg IV Q 12 hrs. Goal AUC 400-550. Expected AUC: 457.8 SCr used: 0.8     Height: 5\' 11"  (180.3 cm) Weight: 78.5 kg (173 lb) IBW/kg (Calculated) : 75.3  Temp (24hrs), Avg:98.7 F (37.1 C), Min:98.3 F (36.8 C), Max:99.1 F (37.3 C)  Recent Labs  Lab 11/18/21 0238 11/19/21 0210 11/21/21 0056 11/23/21 0341 11/24/21 0215  WBC 8.6 9.0 13.0* 15.2* 18.2*  CREATININE 0.77 0.78 0.78 0.80 0.75    Estimated Creatinine Clearance: 111.1 mL/min (by C-G formula based on SCr of 0.75 mg/dL).    Allergies  Allergen Reactions   Erythromycin Nausea Only   Ibuprofen Other (See Comments)    Peptic Ulcer nsaids    Dean [name] S. Alford Highland, PharmD, BCPS Clinical Staff Pharmacist [email or web address]  [place] 11/24/2021 1:19 PM

## 2021-11-24 NOTE — Progress Notes (Signed)
Nutrition Follow-up  DOCUMENTATION CODES:   Not applicable  INTERVENTION:   Encourage good PO intake Continue Multivitamin w/ minerals daily Continue Ensure Enlive po TID, each supplement provides 350 kcal and 20 grams of protein  NUTRITION DIAGNOSIS:   Increased nutrient needs related to post-op healing, cancer and cancer related treatments as evidenced by estimated needs. - Ongoing   GOAL:   Patient will meet greater than or equal to 90% of their needs - Ongoing   MONITOR:   PO intake, Supplement acceptance, Labs  REASON FOR ASSESSMENT:   Consult Assessment of nutrition requirement/status, Calorie Count  ASSESSMENT:   55 y.o. male presented for surgery; pt planned to have a Whipple procedure w/ possible G-tube placement due to malignant biliary stricture. Pt admitted in November with acute pancreatitis No significant PMH.   12/09 - Diagnostic Laparoscopy s/p classical pancreaticoduodenectomy, pancreatic duct stent, and omental flap 12/11 - NGT clamping trials; diet advanced to clear liquid 12/12 - NGT removed 12/13 - diet advanced to full liquids 12/14 - transferred to floor 12/21 - diet advanced to Dys 3, Thin Liquids  Pt reports that he is doing well, that he is tolerate liquids. Pt denies any nausea or vomiting. Pt was advanced to Dys 3 diet and was planning to try solid foods. Pt reports that he has been drinking 3 Ensures/day. States that his distention is about the same, but has no cramping in his abdomen.   Per EMR, pt pt intake includes: 12/19: Breakfast 50%, Lunch 50%, Dinner 50% 12/20: Breakfast 50%, Lunch 50%  Pt with no other questions or concerns at this time.   Medications reviewed and include: Colace, Reglan, MVI, Protonix, Sodium Chloride, IV antibiotics Labs reviewed:  - Sodium 128  Diet Order:   Diet Order             DIET DYS 3 Room service appropriate? Yes; Fluid consistency: Thin  Diet effective now                   EDUCATION  NEEDS:   No education needs have been identified at this time  Skin:  Skin Assessment: Skin Integrity Issues: Skin Integrity Issues:: Incisions Incisions: Abdomen  Last BM:  11/22/2021  Height:   Ht Readings from Last 1 Encounters:  11/12/21 5\' 11"  (1.803 m)    Weight:   Wt Readings from Last 1 Encounters:  11/12/21 78.5 kg    Ideal Body Weight:  78.2 kg  BMI:  Body mass index is 24.13 kg/m.  Estimated Nutritional Needs:   Kcal:  2100-2300  Protein:  105-120 grams  Fluid:  >/= 2.1 L    Keishon Chavarin Louie Casa, RD, LDN Clinical Dietitian See [facility] for contact information.

## 2021-11-24 NOTE — Progress Notes (Signed)
Occupational Therapy Treatment and Discharge Patient Details Name: Dean [name] MRN: [id number] DOB: [birth year] Today's Date: 11/24/2021   History of present illness Patient is a 55 y/o male who presents s/p whipple procedure 11/12/21 secondary to Malignant biliary obstruction. PMH includes bipolar disorder.   OT comments  Cancelled Treatment:    Reason Eval/Treat Not Completed: OT screened, no needs identified, will sign off. In to see patient for treatment session and pt reports he is getting up on his own for toileting, getting to recliner and back, and managing his own ADLs at this point; as well as managing his own IV pole. No further OT needs identified, we will sign off. No HHOT needed at this time.    Recommendations for follow up therapy are one component of a multi-disciplinary discharge planning process, led by the attending physician.  Recommendations may be updated based on patient status, additional functional criteria and insurance authorization.    Follow Up Recommendations  No OT follow up    Assistance Recommended at Discharge Intermittent Supervision/Assistance  Equipment Recommendations  BSC/3in1       Precautions / Restrictions Precautions Precaution Comments: 1 drain on R Restrictions Weight Bearing Restrictions: No                                       Plan Discharge plan needs to be updated                        [place]en Circle, OTR/L Acute Rehab Services Pager [phone] Office [phone]

## 2021-11-24 NOTE — Progress Notes (Signed)
IR was requested for possible drain manipulation.    Case was reviewed by Dr. Serafina Royals, unfortunately no IR interventions can be offered at the moment.  The fluid collection is too deep and surrounded by other structures that prevents percutaneous intervention.  May consider pull the drain back 3-4 cm at bedside, but unclear if it will make significant change.    Ordering provider notified via secure chat.    Will delete the IR rad eval order.  Please call IR for questions and concerns.   Armando Gang Shalen Petrak PA-C 11/24/2021 2:54 PM

## 2021-11-24 NOTE — Progress Notes (Signed)
PATIENT NAVIGATOR PROGRESS NOTE  Name: Dean [name] Date: 11/24/2021 MRN: [id number]  DOB: [birth year]   Reason for visit:  Introductory phone call  Comments:  Called patient and spoke with patient currently hospitalized and recuperating from surgery on 12/9.  Discussed New Patient appt for 12/10/21 at 2:15 with Dr Benay Spice. Directions discussed.  Will send mychart message re: appt and contact information    Time spent counseling/coordinating care: 15-30 minutes

## 2021-11-24 NOTE — Progress Notes (Signed)
Physical Therapy Treatment Patient Details Name: Dean [name] MRN: [id number] DOB: 07/19/1966 Today's Date: 11/24/2021   History of Present Illness Patient is a 55 y/o male who presents s/p whipple procedure 11/12/21 secondary to Malignant biliary obstruction. PMH includes bipolar disorder.    PT Comments    Pt is seen for postural correction including hip extension and gentle repositioning of shoulders.  Pt is motivated to improve but mainly noted his forward head posture and loss of hip extension that has hindered his mobility.  Follow along for goals of acute PT.     Recommendations for follow up therapy are one component of a multi-disciplinary discharge planning process, led by the attending physician.  Recommendations may be updated based on patient status, additional functional criteria and insurance authorization.  Follow Up Recommendations  Home health PT     Assistance Recommended at Discharge Intermittent Supervision/Assistance  Equipment Recommendations  Rolling walker (2 wheels)    Recommendations for Other Services       Precautions / Restrictions Precautions Precautions: Fall Precaution Comments: 1 drain on R Restrictions Weight Bearing Restrictions: No     Mobility  Bed Mobility Overal bed mobility: Needs Assistance             General bed mobility comments: up walking when PT arrived    Transfers Overall transfer level: Modified independent                      Ambulation/Gait Ambulation/Gait assistance: Supervision Gait Distance (Feet): 60 Feet Assistive device: IV Pole Gait Pattern/deviations: Step-through pattern;Decreased stride length;Trunk flexed           Stairs             Wheelchair Mobility    Modified Rankin (Stroke Patients Only)       Balance Overall balance assessment: Needs assistance Sitting-balance support: Feet supported;Bilateral upper extremity supported Sitting balance-Leahy Scale:  Good       Standing balance-Leahy Scale: Fair                              Cognition Arousal/Alertness: Awake/alert Behavior During Therapy: WFL for tasks assessed/performed Overall Cognitive Status: Within Functional Limits for tasks assessed                                          Exercises Other Exercises Other Exercises: wall and standing postural correction work including extension stretches on hips and trunk    General Comments General comments (skin integrity, edema, etc.): pt is demonstrating postural changes that are a part of his abd surgery, and was walking with L shoulder high, in flexed posture      Pertinent Vitals/Pain Pain Assessment: No/denies pain    Home Living                          Prior Function            PT Goals (current goals can now be found in the care plan section) Acute Rehab PT Goals Patient Stated Goal: to return to independence Progress towards PT goals: Progressing toward goals    Frequency    Min 3X/week      PT Plan Current plan remains appropriate    Co-evaluation  AM-PAC PT "6 Clicks" Mobility   Outcome Measure  Help needed turning from your back to your side while in a flat bed without using bedrails?: A Little Help needed moving from lying on your back to sitting on the side of a flat bed without using bedrails?: A Little Help needed moving to and from a bed to a chair (including a wheelchair)?: A Little Help needed standing up from a chair using your arms (e.g., wheelchair or bedside chair)?: A Little Help needed to walk in hospital room?: A Little Help needed climbing 3-5 steps with a railing? : A Little 6 Click Score: 18    End of Session   Activity Tolerance: Patient tolerated treatment well Patient left: with call bell/phone within reach;in bed Nurse Communication: Mobility status PT Visit Diagnosis: Difficulty in walking, not elsewhere classified  (R26.2);Muscle weakness (generalized) (M62.81);Pain     Time: 1340-1353 PT Time Calculation (min) (ACUTE ONLY): 13 min  Charges:  $Therapeutic Exercise: 8-22 mins        Ramond Dial 11/24/2021, 6:35 PM  Mee Hives, PT PhD Acute Rehab Dept. Number: [place] [facility] [place]

## 2021-11-24 NOTE — Progress Notes (Signed)
The proposed treatment discussed in conference is for discussion purpose only and is not a binding recommendation.  The patients have not been physically examined, or presented with their treatment options.  Therefore, final treatment plans cannot be decided.  

## 2021-11-24 NOTE — Progress Notes (Addendum)
Progress Note  12 Days Post-Op  Subjective: Another small BM yesterday. Tolerated full liquids with continued flatus and feeling less bloated. No nausea or emesis. He did not try soft diet yesterday due to taste preferences. Pain well controlled. He denies shortness of breath or cough. He denies voiding complaints - no dysuria or hematuria  Objective: Vital signs in last 24 hours: Temp:  [98.3 F (36.8 C)-99.1 F (37.3 C)] 98.3 F (36.8 C) (12/21 0809) Pulse Rate:  [91-105] 91 (12/21 0809) Resp:  [16-19] 19 (12/21 0809) BP: (109-124)/(68-72) 109/71 (12/21 0809) SpO2:  [96 %-100 %] 98 % (12/21 0809) Last BM Date: 11/22/21  Intake/Output from previous day: 12/20 0701 - 12/21 0700 In: 940.7 [P.O.:720; I.V.:220.7] Out: 215 [Urine:200; Drains:15] Intake/Output this shift: No intake/output data recorded.  PE: General: pleasant, WD, male who is sitting up in bed in NAD HEENT: head is normocephalic, atraumatic. Mouth is pink and moist Heart: regular, rate, and rhythm. Palpable pedal pulses bilaterally Lungs: CTAB, no wheezes, rhonchi, or rales noted.  Respiratory effort nonlabored Abd: soft, +BS, mild appropriate TTP around incisions. Staples intact without discharge. Mild to moderate distension Drain with scant purulent fluid MSK: all 4 extremities are symmetrical with no cyanosis, clubbing, or edema. Calves soft and nontender to palpation bilaterally Skin: warm and dry Psych: A&Ox3 with an appropriate affect.    Lab Results:  Recent Labs    11/23/21 0341 11/24/21 0215  WBC 15.2* 18.2*  HGB 10.3* 10.0*  HCT 30.3* 28.9*  PLT 322 309    BMET Recent Labs    11/23/21 0341 11/24/21 0215  NA 131* 128*  K 4.2 3.9  CL 96* 96*  CO2 30 25  GLUCOSE 99 104*  BUN 6 6  CREATININE 0.80 0.75  CALCIUM 8.5* 8.3*    PT/INR No results for input(s): LABPROT, INR in the last 72 hours. CMP     Component Value Date/Time   NA 128 (L) 11/24/2021 0215   K 3.9 11/24/2021 0215    CL 96 (L) 11/24/2021 0215   CO2 25 11/24/2021 0215   GLUCOSE 104 (H) 11/24/2021 0215   BUN 6 11/24/2021 0215   CREATININE 0.75 11/24/2021 0215   CALCIUM 8.3 (L) 11/24/2021 0215   PROT 5.6 (L) 11/21/2021 0056   ALBUMIN 2.0 (L) 11/21/2021 0056   AST 15 11/21/2021 0056   ALT 20 11/21/2021 0056   ALKPHOS 247 (H) 11/21/2021 0056   BILITOT 1.1 11/21/2021 0056   GFRNONAA >60 11/24/2021 0215   GFRAA >60 08/19/2015 1925   Lipase     Component Value Date/Time   LIPASE 79 (H) 10/07/2021 0424       Studies/Results: No results found.  Anti-infectives: Anti-infectives (From admission, onward)    Start     Dose/Rate Route Frequency Ordered Stop   11/20/21 1000  cefTRIAXone (ROCEPHIN) 2 g in sodium chloride 0.9 % 100 mL IVPB        2 g 200 mL/hr over 30 Minutes Intravenous Every 24 hours 11/20/21 0906     11/12/21 1900  ceFAZolin (ANCEF) IVPB 2g/100 mL premix        2 g 200 mL/hr over 30 Minutes Intravenous Every 8 hours 11/12/21 1713 11/12/21 2013   11/12/21 0700  ceFAZolin (ANCEF) IVPB 2g/100 mL premix        2 g 200 mL/hr over 30 Minutes Intravenous On call to O.R. 11/12/21 0653 11/12/21 1330        Assessment/Plan POD 12 s/p DIAGNOSTIC LAPAROSCOPY (N/A)  WHIPPLE PROCEDURE - Dr. Barry Dienes 12/9 - Pathology pancreas cancer pT1cN1M0   Prn oxy, robaxin, and breakthrough IV dilaudid   - tolerating FLD with BM yesterday. Did not try soft foods due to taste preferences. Reglan started 12/16 - start weaning.   - discontinue staples prior to discharge - Ambulating well. Pulmonary toilet. - Currently [facility] PT recommended.  Will see how mobility is over the next few days - he has good family support - CT scan 12/18: fluid collection in surgical bed - 2.7 x 3.1 x 4.0 cm, indeterminate. The percutaneous drainage catheter abuts the anterior margin of the collection and drain working well to drain this. Discussed with Dr. Barry Dienes - given his continued elevation of WBC (18.2 from 15.2) will  broaden abx to vanc/zosyn/fluconazole and ask IR to evaluate if drain needs to be adjusted - Continue drain - 15 cc output over last 24h   - Mild hyponatremia - increase sodium chloride to 2 g bid - ABL anemia stable   FEN: dys3 for taste preferences - ADAT regular dinner ID: rocephin 12/17-12/21, vanc per pharmacy 12/21, zosyn 12/21, fluconazole 12/21 VTE: lovenox Dispo: IR to eval fluid collection/drain. No follow up per OT. HH PT   LOS: 12 days    [facility] 11/24/2021, 8:18 AM Please see Amion for pager number during day hours 7:00am-4:30pm

## 2021-11-24 NOTE — Progress Notes (Signed)
Mobility Specialist Progress Note:   11/24/21 1130  Mobility  Activity Ambulated in hall  Level of Assistance Modified independent, requires aide device or extra time  Assistive Device Other (Comment) (IV Pole)  Distance Ambulated (ft) 1100 ft  Mobility Ambulated independently in hallway  Mobility Response Tolerated well  Mobility performed by Mobility specialist  $Mobility charge 1 Mobility   Pt asx during ambulation. Still with abdominal discomfort, but no pain. Pt back in room with all needs met.   Nelta Numbers Mobility Specialist  Phone [phone]

## 2021-11-25 LAB — CBC
HCT: 28.9 % — ABNORMAL LOW (ref 39.0–52.0)
Hemoglobin: 9.4 g/dL — ABNORMAL LOW (ref 13.0–17.0)
MCH: 34.4 pg — ABNORMAL HIGH (ref 26.0–34.0)
MCHC: 32.5 g/dL (ref 30.0–36.0)
MCV: 105.9 fL — ABNORMAL HIGH (ref 80.0–100.0)
Platelets: 317 10*3/uL (ref 150–400)
RBC: 2.73 MIL/uL — ABNORMAL LOW (ref 4.22–5.81)
RDW: 13.8 % (ref 11.5–15.5)
WBC: 9.8 10*3/uL (ref 4.0–10.5)
nRBC: 0 % (ref 0.0–0.2)

## 2021-11-25 LAB — BASIC METABOLIC PANEL
Anion gap: 7 (ref 5–15)
BUN: 6 mg/dL (ref 6–20)
CO2: 25 mmol/L (ref 22–32)
Calcium: 8.4 mg/dL — ABNORMAL LOW (ref 8.9–10.3)
Chloride: 97 mmol/L — ABNORMAL LOW (ref 98–111)
Creatinine, Ser: 0.78 mg/dL (ref 0.61–1.24)
GFR, Estimated: >60 mL/min (ref >60)
Glucose, Bld: 97 mg/dL (ref 70–99)
Potassium: 4.1 mmol/L (ref 3.5–5.1)
Sodium: 129 mmol/L — ABNORMAL LOW (ref 135–145)

## 2021-11-25 MED ORDER — SODIUM CHLORIDE 0.9% FLUSH
3.0000 mL | Freq: Two times a day (BID) | INTRAVENOUS | Status: DC
  Administered 2021-11-25 – 2021-11-27 (×5): 3 mL via INTRAVENOUS

## 2021-11-25 MED ORDER — METOCLOPRAMIDE HCL 5 MG/ML IJ SOLN
5.0000 mg | Freq: Two times a day (BID) | INTRAMUSCULAR | Status: DC
  Administered 2021-11-25 – 2021-11-26 (×2): 5 mg via INTRAVENOUS
  Filled 2021-11-25 (×2): qty 2

## 2021-11-25 MED ORDER — BISACODYL 10 MG RE SUPP: 10.0000 mg | Freq: Every day | RECTAL | Status: DC | PRN

## 2021-11-25 MED ORDER — SODIUM CHLORIDE 0.9 % IV SOLN
250.0000 mL | INTRAVENOUS | Status: DC | PRN
  Administered 2021-11-26: 05:00:00 250 mL via INTRAVENOUS

## 2021-11-25 MED ORDER — SODIUM CHLORIDE 0.9% FLUSH: 3.0000 mL | INTRAVENOUS | Status: DC | PRN

## 2021-11-25 NOTE — Progress Notes (Signed)
Progress Note  13 Days Post-Op  Subjective: Good bowel function (BM this am) and tolerating dysphagia diet - enjoying having more variety. Pain very well controlled. Getting out of bed and working with therapies. No new complaints  Objective: Vital signs in last 24 hours: Temp:  [97.8 F (36.6 C)-98.6 F (37 C)] 97.8 F (36.6 C) (12/22 0756) Pulse Rate:  [81-95] 87 (12/22 0756) Resp:  [17-18] 17 (12/22 0756) BP: (118-133)/(73-85) 127/78 (12/22 0756) SpO2:  [98 %-99 %] 98 % (12/22 0756) Last BM Date: 11/23/21  Intake/Output from previous day: 12/21 0701 - 12/22 0700 In: 304.1 [IV Piggyback:304.1] Out: 100 [Urine:100] Intake/Output this shift: No intake/output data recorded.  PE: General: pleasant, WD, male who is sitting up in bed in NAD HEENT: head is normocephalic, atraumatic. Mouth is pink and moist Heart: regular, rate, and rhythm. Palpable radial pulses bilaterally Lungs: CTAB, no wheezes, rhonchi, or rales noted.  Respiratory effort nonlabored Abd: soft, +BS, mild appropriate TTP around incisions. Staples intact without discharge. Mild distension Drain with scant purulent fluid MSK: all 4 extremities are symmetrical with no cyanosis, clubbing, or edema. Calves soft and nontender to palpation bilaterally Skin: warm and dry Psych: A&Ox3 with an appropriate affect.    Lab Results:  Recent Labs    11/24/21 0215 11/25/21 0440  WBC 18.2* 9.8  HGB 10.0* 9.4*  HCT 28.9* 28.9*  PLT 309 317    BMET Recent Labs    11/24/21 0215 11/25/21 0440  NA 128* 129*  K 3.9 4.1  CL 96* 97*  CO2 25 25  GLUCOSE 104* 97  BUN 6 6  CREATININE 0.75 0.78  CALCIUM 8.3* 8.4*    PT/INR No results for input(s): LABPROT, INR in the last 72 hours. CMP     Component Value Date/Time   NA 129 (L) 11/25/2021 0440   K 4.1 11/25/2021 0440   CL 97 (L) 11/25/2021 0440   CO2 25 11/25/2021 0440   GLUCOSE 97 11/25/2021 0440   BUN 6 11/25/2021 0440   CREATININE 0.78 11/25/2021 0440    CALCIUM 8.4 (L) 11/25/2021 0440   PROT 5.6 (L) 11/21/2021 0056   ALBUMIN 2.0 (L) 11/21/2021 0056   AST 15 11/21/2021 0056   ALT 20 11/21/2021 0056   ALKPHOS 247 (H) 11/21/2021 0056   BILITOT 1.1 11/21/2021 0056   GFRNONAA >60 11/25/2021 0440   GFRAA >60 08/19/2015 1925   Lipase     Component Value Date/Time   LIPASE 79 (H) 10/07/2021 0424       Studies/Results: No results found.  Anti-infectives: Anti-infectives (From admission, onward)    Start     Dose/Rate Route Frequency Ordered Stop   11/25/21 1200  fluconazole (DIFLUCAN) IVPB 100 mg        100 mg 50 mL/hr over 60 Minutes Intravenous Every 24 hours 11/24/21 1140     11/25/21 0830  vancomycin (VANCOREADY) IVPB 1250 mg/250 mL        1,250 mg 166.7 mL/hr over 90 Minutes Intravenous Every 12 hours 11/24/21 1318     11/24/21 1415  vancomycin (VANCOREADY) IVPB 1500 mg/300 mL        1,500 mg 150 mL/hr over 120 Minutes Intravenous  Once 11/24/21 1318 11/24/21 2226   11/24/21 1300  fluconazole (DIFLUCAN) IVPB 200 mg        200 mg 100 mL/hr over 60 Minutes Intravenous  Once 11/24/21 1140 11/24/21 1600   11/24/21 1300  piperacillin-tazobactam (ZOSYN) IVPB 3.375 g  3.375 g 12.5 mL/hr over 240 Minutes Intravenous Every 8 hours 11/24/21 1213     11/24/21 1230  piperacillin-tazobactam (ZOSYN) IVPB 2.25 g  Status:  Discontinued        2.25 g 100 mL/hr over 30 Minutes Intravenous Every 6 hours 11/24/21 1140 11/24/21 1212   11/20/21 1000  cefTRIAXone (ROCEPHIN) 2 g in sodium chloride 0.9 % 100 mL IVPB  Status:  Discontinued        2 g 200 mL/hr over 30 Minutes Intravenous Every 24 hours 11/20/21 0906 11/24/21 1140   11/12/21 1900  ceFAZolin (ANCEF) IVPB 2g/100 mL premix        2 g 200 mL/hr over 30 Minutes Intravenous Every 8 hours 11/12/21 1713 11/12/21 2013   11/12/21 0700  ceFAZolin (ANCEF) IVPB 2g/100 mL premix        2 g 200 mL/hr over 30 Minutes Intravenous On call to O.R. 11/12/21 0653 11/12/21 1330         Assessment/Plan POD 12 s/p DIAGNOSTIC LAPAROSCOPY (N/A) WHIPPLE PROCEDURE - Dr. Barry Dienes 12/9 - Pathology pancreas cancer pT1cN1M0 - discussed with patient as well as wife via telephone 12/22. Patient has an appointment with oncology 12/10/21   Prn oxy, robaxin, and breakthrough IV dilaudid - pain very well controlled   - tolerating dys3 diet. advance to regular as tolerated. Reglan started 12/16 - wean to 5 mg q 12h.  stop tomorrow - discontinue staples prior to discharge - Ambulating well. Pulmonary toilet. - Currently [facility] PT recommended.  Will see how mobility is over the next few days - he has good family support - CT scan 12/18: fluid collection in surgical bed - 2.7 x 3.1 x 4.0 cm, indeterminate. The percutaneous drainage catheter abuts the anterior margin of the collection and drain working well to drain this. - IR reviewed CT 12/21 and no further perc drainage is available given location of collection - Given continued elevation of WBC, broadened IV abx to vanc/zosyn/fluconazole 12/21. WBC now 9.8 (18.2)  - Continue drain - limited output over last 24H   - Mild hyponatremia - sodium chloride to 2 g bid, monitor - ABL anemia stable   FEN: dys3 for taste preferences - ADAT regular ID: rocephin 12/17-12/21, vanc per pharmacy 12/21 >, zosyn 12/21>, fluconazole 12/21> VTE: lovenox Dispo: iv abx, monitor wbc. No follow up per OT. HH PT   LOS: 13 days    [facility] 11/25/2021, 10:56 AM Please see Amion for pager number during day hours 7:00am-4:30pm

## 2021-11-25 NOTE — Progress Notes (Signed)
Mobility Specialist Progress Note:   11/25/21 1145  Mobility  Activity Ambulated in hall  Level of Assistance Independent  Assistive Device Other (Comment) (IV Pole)  Distance Ambulated (ft) 1100 ft  Mobility Ambulated independently in hallway  Mobility Response Tolerated well  Mobility performed by Mobility specialist  Bed Position Chair  $Mobility charge 1 Mobility   Pt with better posture today, states he feels he can stand up straight "without it feeling like its ripping apart". Asx during ambulation, pt eager for PT later today, left sitting up in chair.   Nelta Numbers Mobility Specialist  Phone [phone]

## 2021-11-26 ENCOUNTER — Other Ambulatory Visit (HOSPITAL_COMMUNITY): Payer: Self-pay

## 2021-11-26 ENCOUNTER — Inpatient Hospital Stay (HOSPITAL_COMMUNITY): Payer: BC Managed Care – PPO

## 2021-11-26 DIAGNOSIS — R609 Edema, unspecified: Secondary | ICD-10-CM

## 2021-11-26 LAB — BASIC METABOLIC PANEL
Anion gap: 7 (ref 5–15)
BUN: 7 mg/dL (ref 6–20)
CO2: 28 mmol/L (ref 22–32)
Calcium: 9 mg/dL (ref 8.9–10.3)
Chloride: 99 mmol/L (ref 98–111)
Creatinine, Ser: 0.74 mg/dL (ref 0.61–1.24)
GFR, Estimated: >60 mL/min (ref >60)
Glucose, Bld: 82 mg/dL (ref 70–99)
Potassium: 4.6 mmol/L (ref 3.5–5.1)
Sodium: 134 mmol/L — ABNORMAL LOW (ref 135–145)

## 2021-11-26 LAB — CBC
HCT: 31.5 % — ABNORMAL LOW (ref 39.0–52.0)
Hemoglobin: 10.2 g/dL — ABNORMAL LOW (ref 13.0–17.0)
MCH: 34.5 pg — ABNORMAL HIGH (ref 26.0–34.0)
MCHC: 32.4 g/dL (ref 30.0–36.0)
MCV: 106.4 fL — ABNORMAL HIGH (ref 80.0–100.0)
Platelets: 388 10*3/uL (ref 150–400)
RBC: 2.96 MIL/uL — ABNORMAL LOW (ref 4.22–5.81)
RDW: 13.6 % (ref 11.5–15.5)
WBC: 6.8 10*3/uL (ref 4.0–10.5)
nRBC: 0 % (ref 0.0–0.2)

## 2021-11-26 MED ORDER — APIXABAN 5 MG PO TABS: 5.0000 mg | ORAL_TABLET | Freq: Two times a day (BID) | ORAL | 1 refills | Status: DC

## 2021-11-26 MED ORDER — AMOXICILLIN-POT CLAVULANATE 875-125 MG PO TABS
1.0000 | ORAL_TABLET | Freq: Two times a day (BID) | ORAL | 0 refills | Status: AC
  Filled 2021-11-26: qty 20, 10d supply, fill #0

## 2021-11-26 MED ORDER — OXYCODONE HCL 5 MG PO TABS
5.0000 mg | ORAL_TABLET | Freq: Four times a day (QID) | ORAL | 0 refills | Status: AC | PRN
  Filled 2021-11-26: qty 12, 3d supply, fill #0

## 2021-11-26 MED ORDER — APIXABAN (ELIQUIS) EDUCATION KIT FOR DVT/PE PATIENTS
PACK | Freq: Once | Status: DC
  Filled 2021-11-26 (×3): qty 1

## 2021-11-26 MED ORDER — APIXABAN 5 MG PO TABS: 5.0000 mg | ORAL_TABLET | Freq: Two times a day (BID) | ORAL | Status: DC

## 2021-11-26 MED ORDER — APIXABAN 5 MG PO TABS
10.0000 mg | ORAL_TABLET | Freq: Two times a day (BID) | ORAL | Status: DC
  Administered 2021-11-26 – 2021-11-27 (×2): 10 mg via ORAL
  Filled 2021-11-26 (×2): qty 2

## 2021-11-26 MED ORDER — APIXABAN (ELIQUIS) VTE STARTER PACK (10MG AND 5MG)
ORAL_TABLET | ORAL | 0 refills | Status: DC
  Filled 2021-11-26: qty 74, 30d supply, fill #0

## 2021-11-26 NOTE — Progress Notes (Signed)
Patient ID: Dean [name], male   DOB: 16-Oct-1966, 55 y.o.   MRN: [id number] [facility] Surgery Progress Note:   14 Days Post-Op  Subjective: Mental status is clear.  Complaints none. Objective: Vital signs in last 24 hours: Temp:  [97.8 F (36.6 C)-98.5 F (36.9 C)] 97.8 F (36.6 C) (12/23 0729) Pulse Rate:  [66-89] 66 (12/23 0729) Resp:  [17-18] 17 (12/23 0729) BP: (121-142)/(68-84) 126/77 (12/23 0729) SpO2:  [97 %-99 %] 99 % (12/23 0729)  Intake/Output from previous day: 12/22 0701 - 12/23 0700 In: 1736 [P.O.:1080; IV Piggyback:656] Out: 1085 [Urine:1075; Drains:10] Intake/Output this shift: No intake/output data recorded.  Physical Exam: Work of breathing is not labored.  Drainage decreased significantly through the right side.   Lab Results:  Results for orders placed or performed during the hospital encounter of 11/12/21 (from the past 48 hour(s))  CBC     Status: Abnormal   Collection Time: 11/25/21  4:40 AM  Result Value Ref Range   WBC 9.8 4.0 - 10.5 K/uL   RBC 2.73 (L) 4.22 - 5.81 MIL/uL   Hemoglobin 9.4 (L) 13.0 - 17.0 g/dL   HCT 28.9 (L) 39.0 - 52.0 %   MCV 105.9 (H) 80.0 - 100.0 fL   MCH 34.4 (H) 26.0 - 34.0 pg   MCHC 32.5 30.0 - 36.0 g/dL   RDW 13.8 11.5 - 15.5 %   Platelets 317 150 - 400 K/uL   nRBC 0.0 0.0 - 0.2 %    Comment: Performed at [facility], [place] [address] [place], [place] [address]  Basic metabolic panel     Status: Abnormal   Collection Time: 11/25/21  4:40 AM  Result Value Ref Range   Sodium 129 (L) 135 - 145 mmol/L   Potassium 4.1 3.5 - 5.1 mmol/L   Chloride 97 (L) 98 - 111 mmol/L   CO2 25 22 - 32 mmol/L   Glucose, Bld 97 70 - 99 mg/dL    Comment: Glucose reference range applies only to samples taken after fasting for at least 8 hours.   BUN 6 6 - 20 mg/dL   Creatinine, Ser 0.78 0.61 - 1.24 mg/dL   Calcium 8.4 (L) 8.9 - 10.3 mg/dL   GFR, Estimated >60 >60 mL/min    Comment: (NOTE) Calculated using the CKD-EPI  Creatinine Equation (2021)    Anion gap 7 5 - 15    Comment: Performed at [facility] [address], [place], [place] [address]  Basic metabolic panel     Status: Abnormal   Collection Time: 11/26/21  1:33 AM  Result Value Ref Range   Sodium 134 (L) 135 - 145 mmol/L   Potassium 4.6 3.5 - 5.1 mmol/L   Chloride 99 98 - 111 mmol/L   CO2 28 22 - 32 mmol/L   Glucose, Bld 82 70 - 99 mg/dL    Comment: Glucose reference range applies only to samples taken after fasting for at least 8 hours.   BUN 7 6 - 20 mg/dL   Creatinine, Ser 0.74 0.61 - 1.24 mg/dL   Calcium 9.0 8.9 - 10.3 mg/dL   GFR, Estimated >60 >60 mL/min    Comment: (NOTE) Calculated using the CKD-EPI Creatinine Equation (2021)    Anion gap 7 5 - 15    Comment: Performed at [facility] [address], [place],  [address]  CBC     Status: Abnormal   Collection Time: 11/26/21  1:33 AM  Result Value Ref Range   WBC 6.8 4.0 - 10.5 K/uL   RBC 2.96 (L) 4.22 - 5.81 MIL/uL   Hemoglobin 10.2 (L) 13.0 - 17.0 g/dL   HCT 31.5 (L) 39.0 - 52.0 %   MCV 106.4 (H) 80.0 - 100.0 fL   MCH 34.5 (H) 26.0 - 34.0 pg   MCHC 32.4 30.0 - 36.0 g/dL   RDW 13.6 11.5 - 15.5 %   Platelets 388 150 - 400 K/uL   nRBC 0.0 0.0 - 0.2 %    Comment: Performed at [facility], [place] [address] [place], [place] [address]    Radiology/Results: No results found.  Anti-infectives: Anti-infectives (From admission, onward)    Start     Dose/Rate Route Frequency Ordered Stop   11/25/21 1200  fluconazole (DIFLUCAN) IVPB 100 mg        100 mg 50 mL/hr over 60 Minutes Intravenous Every 24 hours 11/24/21 1140     11/25/21 0830  vancomycin (VANCOREADY) IVPB 1250 mg/250 mL        1,250 mg 166.7 mL/hr over 90 Minutes Intravenous Every 12 hours 11/24/21 1318     11/24/21 1415  vancomycin (VANCOREADY) IVPB 1500 mg/300 mL        1,500 mg 150 mL/hr over 120 Minutes Intravenous  Once 11/24/21 1318 11/24/21 2226   11/24/21 1300  fluconazole  (DIFLUCAN) IVPB 200 mg        200 mg 100 mL/hr over 60 Minutes Intravenous  Once 11/24/21 1140 11/24/21 1600   11/24/21 1300  piperacillin-tazobactam (ZOSYN) IVPB 3.375 g        3.375 g 12.5 mL/hr over 240 Minutes Intravenous Every 8 hours 11/24/21 1213     11/24/21 1230  piperacillin-tazobactam (ZOSYN) IVPB 2.25 g  Status:  Discontinued        2.25 g 100 mL/hr over 30 Minutes Intravenous Every 6 hours 11/24/21 1140 11/24/21 1212   11/20/21 1000  cefTRIAXone (ROCEPHIN) 2 g in sodium chloride 0.9 % 100 mL IVPB  Status:  Discontinued        2 g 200 mL/hr over 30 Minutes Intravenous Every 24 hours 11/20/21 0906 11/24/21 1140   11/12/21 1900  ceFAZolin (ANCEF) IVPB 2g/100 mL premix        2 g 200 mL/hr over 30 Minutes Intravenous Every 8 hours 11/12/21 1713 11/12/21 2013   11/12/21 0700  ceFAZolin (ANCEF) IVPB 2g/100 mL premix        2 g 200 mL/hr over 30 Minutes Intravenous On call to O.R. 11/12/21 0653 11/12/21 1330       Assessment/Plan: Problem List: Patient Active Problem List   Diagnosis Date Noted   Hx of malignant neoplasm of bile duct 11/12/2021   Malignant biliary obstruction ([place]) 11/12/2021   Acute pancreatitis 10/05/2021   Hypothyroidism 10/05/2021   Bipolar disorder ([place]) 12/25/2018    WBC has fallen to normal range x 2 days.  No fever.  Appetite good on present diet (he doesn't want to advance) and plan possible discharge tomorrow with drain in place and leaving staples in place.   14 Days Post-Op    LOS: 14 days   Matt B. Hassell Done, MD, [facility] [phone] to reach the surgeon on call.    11/26/2021 10:55 AM

## 2021-11-26 NOTE — Discharge Instructions (Addendum)
[facility] DISCHARGE INSTRUCTIONS: WHIPPLE PROCEDURE  Activity No heavy lifting greater than 10 pounds for 4 weeks after surgery. Ok to shower, but do not bathe or submerge incision underwater. Do not drive while taking narcotic pain medication. Be sure to walk around your home at least 3 times daily. This will help avoid blood clots and will improve your strength.  Wound Care Your incisions were closed with staples that were removed prior to discharge You may shower and allow warm soapy water to run over your incisions. Gently pat dry. Do not submerge your incision underwater. Monitor your incision for any new redness, tenderness, or drainage. You were discharged with a drain and have separate instructions for care. Keep drain site clean and dry changing your dressing as needed but at lease daily.  Medications You should take Tylenol 4 times daily for pain. If you have more severe pain that is not covered by Tylenol, you may take prescribed pain medication up to every 6 hours as needed. Please reserve this medication only for the most severe pain. Complete your antibiotic as prescribed  Diet You may notice that you feel full early after meals, or have occasional nausea or decreased appetite. This is normal following a Whipple and will improve with time. It is better to consume small frequent meals throughout the day rather than 3 large meals. Avoid drinking a lot of carbonated beverages, as these can worsen symptoms of nausea and reflux. If you are having frequent vomiting after meals or are unable to keep down any food or drink, please call the office right away.  When to Call us: Fever greater than 100.15F New redness, drainage, or swelling at incision site Severe pain, nausea, or vomiting Excessive vomiting or inability to keep down any liquids Low blood sugar or very high blood sugar (greater than 300) Jaundice (yellowing of the whites of the eyes or  skin)  Follow-up You have an appointment scheduled with Dr. Barry Dienes on 12/10/21. This will be at the [facility] Surgery office at [address] [place], [place], [place]. Please arrive at least 15 minutes prior to your scheduled appointment time.  For questions or concerns, please call the office at [phone].      Information on my medicine - ELIQUIS (apixaban)  Why was Eliquis prescribed for you? Eliquis was prescribed to treat blood clots that may have been found in the veins of your legs (deep vein thrombosis) or in your lungs (pulmonary embolism) and to reduce the risk of them occurring again.  What do You need to know about Eliquis ? The starting dose is 10 mg (two 5 mg tablets) taken TWICE daily for the FIRST SEVEN (7) DAYS, then on 12/03/2021 the dose is reduced to ONE 5 mg tablet taken TWICE daily.  Eliquis may be taken with or without food.   Try to take the dose about the same time in the morning and in the evening. If you have difficulty swallowing the tablet whole please discuss with your pharmacist how to take the medication safely.  Take Eliquis exactly as prescribed and DO NOT stop taking Eliquis without talking to the doctor who prescribed the medication.  Stopping may increase your risk of developing a new blood clot.  Refill your prescription before you run out.  After discharge, you should have regular check-up appointments with your healthcare provider that is prescribing your Eliquis.    What do you do if you miss a dose? If a dose  of ELIQUIS is not taken at the scheduled time, take it as soon as possible on the same day and twice-daily administration should be resumed. The dose should not be doubled to make up for a missed dose.  Important Safety Information A possible side effect of Eliquis is bleeding. You should call your healthcare provider right away if you experience any of the following: Bleeding from an injury or your nose that does  not stop. Unusual colored urine (red or dark brown) or unusual colored stools (red or black). Unusual bruising for unknown reasons. A serious fall or if you hit your head (even if there is no bleeding).  Some medicines may interact with Eliquis and might increase your risk of bleeding or clotting while on Eliquis. To help avoid this, consult your healthcare provider or pharmacist prior to using any new prescription or non-prescription medications, including herbals, vitamins, non-steroidal anti-inflammatory drugs (NSAIDs) and supplements.  This website has more information on Eliquis (apixaban): http://www.eliquis.com/eliquis/home    **If need another copy of monthly copay card, please see website (https://[email or web address]) to get access to reduce for future refills.

## 2021-11-26 NOTE — Progress Notes (Addendum)
[place] for apixaban Indication: DVT  Allergies  Allergen Reactions   Erythromycin Nausea Only   Ibuprofen Other (See Comments)    Peptic Ulcer nsaids    Patient Measurements: Height: 5\' 11"  (180.3 cm) Weight: 78.5 kg (173 lb) IBW/kg (Calculated) : 75.3  Vital Signs: Temp: 97.8 F (36.6 C) (12/23 1507) Temp Source: Oral (12/23 1507) BP: 135/82 (12/23 1507) Pulse Rate: 72 (12/23 1507)  Labs: Recent Labs    11/24/21 0215 11/25/21 0440 11/26/21 0133  HGB 10.0* 9.4* 10.2*  HCT 28.9* 28.9* 31.5*  PLT 309 317 388  CREATININE 0.75 0.78 0.74    Estimated Creatinine Clearance: 111.1 mL/min (by C-G formula based on SCr of 0.74 mg/dL).   Medical History: Past Medical History:  Diagnosis Date   Bipolar 1 disorder ([place])    GERD (gastroesophageal reflux disease)    Hypothyroidism    Thyroid disease     Medications:  Scheduled:   chlorhexidine  15 mL Mouth Rinse BID   Chlorhexidine Gluconate Cloth  6 each Topical Q0600   divalproex  2,000 mg Oral QHS   docusate sodium  100 mg Oral BID   enoxaparin (LOVENOX) injection  40 mg Subcutaneous Q24H   feeding supplement  237 mL Oral TID BM   gabapentin  100 mg Oral BID   levothyroxine  150 mcg Oral Q0600   multivitamin with minerals  1 tablet Oral Daily   pantoprazole  40 mg Oral Q1200   sodium chloride flush  3 mL Intravenous Q12H   sodium chloride  2 g Oral BID WC    Assessment: 29 yom now s/p diagnostic laparoscopy - no AC PTA.   Duplex of LE completed today finding acute DVT in L soleal vein. Price check completed for apixaban ($62.18 - but should be able to use monthly copay card to reduce price further). Hgb 10.2, plt 388. No s/sx of bleeding.   Goal of Therapy:  Monitor platelets by anticoagulation protocol: Yes   Plan:  Apixaban 10 mg BID for 7 days then 5 mg BID thereafter Monitor CBC and for s/sx of bleeding   Antonietta Jewel, PharmD, [place] Pharmacist   Phone: [phone] 11/26/2021 4:22 PM  Please check AMION for all [place] phone numbers After 10:00 PM, call [place] [phone]

## 2021-11-26 NOTE — Progress Notes (Signed)
Mobility Specialist Progress Note:   11/26/21 1120  Mobility  Activity Ambulated in hall  Level of Assistance Modified independent, requires aide device or extra time  Assistive Device Other (Comment) (IV Pole)  Distance Ambulated (ft) 1100 ft  Mobility Ambulated independently in hallway  Mobility Response Tolerated well  Mobility performed by Mobility specialist  $Mobility charge 1 Mobility   Pt asx during ambulation. Feet still with swelling, no pain associated. Pt back in room with all needs met.  Nelta Numbers Mobility Specialist  Phone [phone]

## 2021-11-26 NOTE — Progress Notes (Signed)
Physical Therapy Treatment Patient Details Name: Dean [name] MRN: [id number] DOB: October 28, 1966 Today's Date: 11/26/2021   History of Present Illness Patient is a 55 y/o male who presents s/p whipple procedure 11/12/21 secondary to Malignant biliary obstruction. PMH includes bipolar disorder.    PT Comments    Pt standing in room leaving bathroom unassisted.  At this time he has met all goals and no longer requires skilled PT intervention.  Reviewed stairs this pm and importance of posture.  Will inform supervising PT to sign off on patient at this time.   Physical Therapy Discharge Patient Details Name: ALONTE WULFF MRN: [id number] DOB: August 10, 1966 Today's Date: 11/26/2021 Time: 4628-6381 PT Time Calculation (min) (ACUTE ONLY): 13 min  Patient discharged from PT services secondary to goals met and no further PT needs identified.  Please see latest therapy progress note for current level of functioning and progress toward goals.    Progress and discharge plan discussed with patient and/or caregiver: Patient/Caregiver agrees with plan  GP     Karry Barrilleaux Eli Hose 11/26/2021, 12:18 PM    Recommendations for follow up therapy are one component of a multi-disciplinary discharge planning process, led by the attending physician.  Recommendations may be updated based on patient status, additional functional criteria and insurance authorization.  Follow Up Recommendations  No PT follow up     Assistance Recommended at Discharge Intermittent Supervision/Assistance  Equipment Recommendations  None recommended by PT    Recommendations for Other Services       Precautions / Restrictions Precautions Precautions: Fall Precaution Comments: 1 drain on R Restrictions Weight Bearing Restrictions: No     Mobility  Bed Mobility Overal bed mobility: Independent                  Transfers Overall transfer level: Independent                       Ambulation/Gait Ambulation/Gait assistance: Independent Gait Distance (Feet): 350 Feet Assistive device: None Gait Pattern/deviations: Step-through pattern;WFL(Within Functional Limits);Trunk flexed       General Gait Details: Cues for scap retraction and upper trunk control.   Stairs Stairs: Yes Stairs assistance: Independent Stair Management: One rail Left;Forwards;Alternating pattern;Step to pattern Number of Stairs: 3 General stair comments: pt able to ascend steps with alternating pattern and descend with step to. One hand on rail as pt reports he had a window ledge to hold onto at home. NO assistance needed.   Wheelchair Mobility    Modified Rankin (Stroke Patients Only)       Balance Overall balance assessment: Needs assistance Sitting-balance support: Feet supported;Bilateral upper extremity supported Sitting balance-Leahy Scale: Good       Standing balance-Leahy Scale: Fair Standing balance comment: reliant on UE support for stability. Can maintain static stance without support                            Cognition Arousal/Alertness: Awake/alert Behavior During Therapy: WFL for tasks assessed/performed Overall Cognitive Status: Within Functional Limits for tasks assessed                                          Exercises      General Comments        Pertinent Vitals/Pain Pain Assessment: No/denies pain    Home Living  Prior Function            PT Goals (current goals can now be found in the care plan section) Acute Rehab PT Goals Patient Stated Goal: to return to independence PT Goal Formulation: With patient Potential to Achieve Goals: Good Progress towards PT goals: Goals met/education completed, patient discharged from PT    Frequency           PT Plan Discharge plan needs to be updated;Frequency needs to be updated    Co-evaluation              AM-PAC PT  "6 Clicks" Mobility   Outcome Measure  Help needed turning from your back to your side while in a flat bed without using bedrails?: None Help needed moving from lying on your back to sitting on the side of a flat bed without using bedrails?: None Help needed moving to and from a bed to a chair (including a wheelchair)?: None Help needed standing up from a chair using your arms (e.g., wheelchair or bedside chair)?: None Help needed to walk in hospital room?: None Help needed climbing 3-5 steps with a railing? : None 6 Click Score: 24    End of Session   Activity Tolerance: Patient tolerated treatment well Patient left: with call bell/phone within reach;in bed Nurse Communication: Mobility status (informed nursing he was okay to ambulate halls unassisted.) PT Visit Diagnosis: Difficulty in walking, not elsewhere classified (R26.2);Muscle weakness (generalized) (M62.81);Pain Pain - Right/Left:  (abdomen) Pain - part of body:  (abdomen)     Time: 7014-1030 PT Time Calculation (min) (ACUTE ONLY): 13 min  Charges:  $Gait Training: 8-22 mins                     Erasmo Leventhal , PTA Acute Rehabilitation Services Pager [phone] Office [phone]    Yalitza Teed Eli Hose 11/26/2021, 12:16 PM

## 2021-11-26 NOTE — Progress Notes (Signed)
Lower extremity venous bilateral study completed.  Preliminary results relayed to [facility], RN.  See CV Proc for preliminary results report.   Darlin Coco, RDMS, RVT

## 2021-11-26 NOTE — Progress Notes (Signed)
Progress Note  14 Days Post-Op  Subjective: Multiple bowel movements and abdominal pain well controlled, no nausea or emesis. Has continued lower extremity swelling R>L which I evaluated yesterday afternoon and ordered lower extremity ultrasounds - not yet performed. Has also had drainage around surgical drain. Ambulating. No respiratory complaints.   Objective: Vital signs in last 24 hours: Temp:  [97.8 F (36.6 C)-98.5 F (36.9 C)] 97.8 F (36.6 C) (12/23 0729) Pulse Rate:  [66-89] 66 (12/23 0729) Resp:  [17-18] 17 (12/23 0729) BP: (121-142)/(68-84) 126/77 (12/23 0729) SpO2:  [97 %-99 %] 99 % (12/23 0729) Last BM Date: 11/23/21  Intake/Output from previous day: 12/22 0701 - 12/23 0700 In: 1736 [P.O.:1080; IV Piggyback:656] Out: 1085 [Urine:1075; Drains:10] Intake/Output this shift: No intake/output data recorded.  PE: General: pleasant, WD, male who is sitting up in bed in NAD HEENT: head is normocephalic, atraumatic. Mouth is pink and moist Heart: regular, rate, and rhythm. Palpable radial pulses bilaterally Lungs: CTAB, no wheezes, rhonchi, or rales noted.  Respiratory effort nonlabored Abd: soft, +BS, mild appropriate TTP around incisions. Staples intact without discharge. Mild distension. Drain with scant purulent fluid - bandage is moderately saturated this am and changed by me. Prior drain site minimally open with minimal serous drainage - no surrounding erythema MSK: bilateral upper extremities are symmetrical with no cyanosis, clubbing, edema. Calves soft with moderate non pitting edema R>L. No erythema or heat. Minimal TTP R ventral ankle patient states is associated with tightness from the swelling Skin: warm and dry Psych: A&Ox3 with an appropriate affect.    Lab Results:  Recent Labs    11/25/21 0440 11/26/21 0133  WBC 9.8 6.8  HGB 9.4* 10.2*  HCT 28.9* 31.5*  PLT 317 388    BMET Recent Labs    11/25/21 0440 11/26/21 0133  NA 129* 134*  K 4.1  4.6  CL 97* 99  CO2 25 28  GLUCOSE 97 82  BUN 6 7  CREATININE 0.78 0.74  CALCIUM 8.4* 9.0    PT/INR No results for input(s): LABPROT, INR in the last 72 hours. CMP     Component Value Date/Time   NA 134 (L) 11/26/2021 0133   K 4.6 11/26/2021 0133   CL 99 11/26/2021 0133   CO2 28 11/26/2021 0133   GLUCOSE 82 11/26/2021 0133   BUN 7 11/26/2021 0133   CREATININE 0.74 11/26/2021 0133   CALCIUM 9.0 11/26/2021 0133   PROT 5.6 (L) 11/21/2021 0056   ALBUMIN 2.0 (L) 11/21/2021 0056   AST 15 11/21/2021 0056   ALT 20 11/21/2021 0056   ALKPHOS 247 (H) 11/21/2021 0056   BILITOT 1.1 11/21/2021 0056   GFRNONAA >60 11/26/2021 0133   GFRAA >60 08/19/2015 1925   Lipase     Component Value Date/Time   LIPASE 79 (H) 10/07/2021 0424       Studies/Results: No results found.  Anti-infectives: Anti-infectives (From admission, onward)    Start     Dose/Rate Route Frequency Ordered Stop   11/25/21 1200  fluconazole (DIFLUCAN) IVPB 100 mg        100 mg 50 mL/hr over 60 Minutes Intravenous Every 24 hours 11/24/21 1140     11/25/21 0830  vancomycin (VANCOREADY) IVPB 1250 mg/250 mL        1,250 mg 166.7 mL/hr over 90 Minutes Intravenous Every 12 hours 11/24/21 1318     11/24/21 1415  vancomycin (VANCOREADY) IVPB 1500 mg/300 mL        1,500 mg 150  mL/hr over 120 Minutes Intravenous  Once 11/24/21 1318 11/24/21 2226   11/24/21 1300  fluconazole (DIFLUCAN) IVPB 200 mg        200 mg 100 mL/hr over 60 Minutes Intravenous  Once 11/24/21 1140 11/24/21 1600   11/24/21 1300  piperacillin-tazobactam (ZOSYN) IVPB 3.375 g        3.375 g 12.5 mL/hr over 240 Minutes Intravenous Every 8 hours 11/24/21 1213     11/24/21 1230  piperacillin-tazobactam (ZOSYN) IVPB 2.25 g  Status:  Discontinued        2.25 g 100 mL/hr over 30 Minutes Intravenous Every 6 hours 11/24/21 1140 11/24/21 1212   11/20/21 1000  cefTRIAXone (ROCEPHIN) 2 g in sodium chloride 0.9 % 100 mL IVPB  Status:  Discontinued        2  g 200 mL/hr over 30 Minutes Intravenous Every 24 hours 11/20/21 0906 11/24/21 1140   11/12/21 1900  ceFAZolin (ANCEF) IVPB 2g/100 mL premix        2 g 200 mL/hr over 30 Minutes Intravenous Every 8 hours 11/12/21 1713 11/12/21 2013   11/12/21 0700  ceFAZolin (ANCEF) IVPB 2g/100 mL premix        2 g 200 mL/hr over 30 Minutes Intravenous On call to O.R. 11/12/21 0653 11/12/21 1330        Assessment/Plan POD 14 s/p DIAGNOSTIC LAPAROSCOPY (N/A) WHIPPLE PROCEDURE - Dr. Barry Dienes 12/9 - Pathology pancreas cancer [id number] - discussed with patient as well as wife via telephone 12/22. Patient has an appointment with oncology 12/10/21   Prn oxy, robaxin, and breakthrough IV dilaudid - pain very well controlled   - tolerating dys3 diet. advance to regular as tolerated. Reglan started 12/16 - stop today - discontinue staples prior to discharge - Ambulating well. Pulmonary toilet. - Currently [place] PT recommended. - CT scan 12/18: fluid collection in surgical bed - 2.7 x 3.1 x 4.0 cm, indeterminate. The percutaneous drainage catheter abuts the anterior margin of the collection and drain working well to drain this. - IR reviewed CT 12/21 and no further perc drainage is available given location of collection - Given continued elevation of WBC, broadened IV abx to vanc/zosyn/fluconazole 12/21. WBC now 6.8  - Continue drain - limited output over last 24H   - Mild hyponatremia - sodium chloride to 2 g bid, monitor - ABL anemia stable  - Lower extremity edema - doppler US today  FEN: dys3 for taste preferences - ADAT regular ID: rocephin 12/17-12/21, vanc per pharmacy 12/21 >, zosyn 12/21>, fluconazole 12/21> VTE: lovenox  Dispo: LE [place] today to rule out DVT. HH PT. Recheck WBC am and discharge tomorrow on 10 days of Augmentin with drain in place. Staples out prior to dc. Follow up with Dr. Barry Dienes  Case and plan discussed with Dr. Barry Dienes and Dr. Hassell Done   LOS: 14 days    Winferd Humphrey, [facility] 11/26/2021, 11:18 AM Please see Amion for pager number during day hours 7:00am-4:30pm

## 2021-11-27 LAB — BASIC METABOLIC PANEL
Anion gap: 7 (ref 5–15)
BUN: 7 mg/dL (ref 6–20)
CO2: 28 mmol/L (ref 22–32)
Calcium: 9.1 mg/dL (ref 8.9–10.3)
Chloride: 101 mmol/L (ref 98–111)
Creatinine, Ser: 0.74 mg/dL (ref 0.61–1.24)
GFR, Estimated: >60 mL/min (ref >60)
Glucose, Bld: 111 mg/dL — ABNORMAL HIGH (ref 70–99)
Potassium: 4.3 mmol/L (ref 3.5–5.1)
Sodium: 136 mmol/L (ref 135–145)

## 2021-11-27 LAB — CBC
HCT: 31.6 % — ABNORMAL LOW (ref 39.0–52.0)
Hemoglobin: 10.1 g/dL — ABNORMAL LOW (ref 13.0–17.0)
MCH: 33.4 pg (ref 26.0–34.0)
MCHC: 32 g/dL (ref 30.0–36.0)
MCV: 104.6 fL — ABNORMAL HIGH (ref 80.0–100.0)
Platelets: 421 10*3/uL — ABNORMAL HIGH (ref 150–400)
RBC: 3.02 MIL/uL — ABNORMAL LOW (ref 4.22–5.81)
RDW: 13.6 % (ref 11.5–15.5)
WBC: 6.7 10*3/uL (ref 4.0–10.5)
nRBC: 0 % (ref 0.0–0.2)

## 2021-11-27 NOTE — Plan of Care (Signed)
°  Problem: Clinical Measurements: Goal: Will remain free from infection Outcome: Not Progressing   Problem: Nutrition: Goal: Adequate nutrition will be maintained Outcome: Not Progressing   

## 2021-11-27 NOTE — Discharge Summary (Signed)
Physician Discharge Summary  Patient ID: Dean [name] MRN: [id number] DOB/AGE: 55-Aug-1967 55 y.o.  PCP: Scifres, Dorothy, PA-C  Admit date: 11/12/2021 Discharge date: 11/27/2021  Admission Diagnoses:  distal bile duct obstruction  Discharge Diagnoses:  adenocarcinoma of the pancreas with + node and positive margins along the SMA/SMV  Principal Problem:   Hx of malignant neoplasm of bile duct Active Problems:   Malignant biliary obstruction [facility])   Surgery:  pancreatoduodenectomy and cholecystectomy  Discharged Condition: improved -with drain   Hospital Course:   Had surgery by Dr. Barry Dienes on 11/12/2021.  Postop collection consistent with abscess responded to antibiotics.  Will leave drain in place and carry on oral Augmentin for antibiotic coverage  Consults: none  Significant Diagnostic Studies: many    Discharge Exam: Blood pressure 138/81, pulse 65, temperature 98.1 F (36.7 C), temperature source Oral, resp. rate 16, height 5\' 11"  (1.803 m), weight 78.5 kg, SpO2 99 %. Incision is ok.  Staples out  Drain on right side to gravity drainage  Disposition: Discharge disposition: 01-Home or Self Care       Discharge Instructions     Call MD for:  difficulty breathing, headache or visual disturbances   Complete by: As directed    Call MD for:  redness, tenderness, or signs of infection (pain, swelling, redness, odor or green/yellow discharge around incision site)   Complete by: As directed    Call MD for:  severe uncontrolled pain   Complete by: As directed    Call MD for:  temperature >100.4   Complete by: As directed    Discharge instructions   Complete by: As directed    Record drainage daily. May shower with drain.  Use hairdryer if incision or drain site is tender.   Stay on the diet that you were tolerating in the hospital Take the Augmentin for infection Take Eliquis for the deep venous thrombosis in your left calf   Increase activity slowly    Complete by: As directed       Allergies as of 11/27/2021       Reactions   Erythromycin Nausea Only   Ibuprofen Other (See Comments)   Peptic Ulcer nsaids        Medication List     TAKE these medications    ALPRAZolam 0.5 MG tablet Commonly known as: XANAX Take 1 tablet (0.5 mg total) by mouth at bedtime as needed for anxiety.   amoxicillin-clavulanate 875-125 MG tablet Commonly known as: Augmentin Take 1 tablet by mouth 2 (two) times daily for 10 days.   divalproex 500 MG 24 hr tablet Commonly known as: DEPAKOTE ER TAKE 5 TABLETS (2,500 MG TOTAL) BY MOUTH DAILY. What changed:  how much to take when to take this   Eliquis DVT/PE Starter Pack Generic drug: Apixaban Starter Pack (10mg  and 5mg ) Take as directed on package: start with two-5mg  tablets twice daily for 7 days. On day 8, switch to one-5mg  tablet twice daily.   apixaban 5 MG Tabs tablet Commonly known as: ELIQUIS Take 1 tablet (5 mg total) by mouth 2 (two) times daily. Start after starter pack completed Start taking on: December 26, 2021 Notes to patient: Start after starter pack from [place] completed    levothyroxine 137 MCG tablet Commonly known as: SYNTHROID Take 137 mcg by mouth daily before breakfast.   One Daily Mens 50+ Multivit Tabs Take 1 tablet by mouth daily.   oxyCODONE 5 MG immediate release tablet Commonly  known as: Oxy IR/ROXICODONE Take 1 tablet (5 mg total) by mouth every 6 (six) hours as needed for up to 5 days for moderate pain or severe pain.   pantoprazole 40 MG tablet Commonly known as: PROTONIX Take 40 mg by mouth daily.               Durable Medical Equipment  (From admission, onward)           Start     Ordered   11/18/21 1157  For home use only DME 3 n 1  Once        11/18/21 1156   11/18/21 1154  For home use only DME Walker rolling  Once       Question Answer Comment  Walker: With 5 Inch Wheels   Patient needs a  walker to treat with the following condition Weakness      11/18/21 1153            Follow-up Information     Stark Klein, MD Follow up on 12/10/2021.   Specialty: General Surgery Why: 10am. Please arrive 15 minutes prior to your appointment for paperwork. Please bring a copy of your photo ID and insurance card. Contact information: [address] [place] [address] [phone]                 Signed: Pedro Earls 11/27/2021, 10:31 AM

## 2021-11-30 ENCOUNTER — Telehealth: Payer: Self-pay | Admitting: *Deleted

## 2021-11-30 NOTE — Telephone Encounter (Signed)
Called patient after wife had called scheduler in tears asking for a sooner appointment. Patient was unaware that she had called, but agreed he would like a sooner appointment if there is a cancellation. Informed him that nurse will call if we have a cancellation.

## 2021-12-03 ENCOUNTER — Other Ambulatory Visit: Payer: Self-pay | Admitting: General Surgery

## 2021-12-03 DIAGNOSIS — C250 Malignant neoplasm of head of pancreas: Secondary | ICD-10-CM | POA: Diagnosis not present

## 2021-12-03 DIAGNOSIS — E43 Unspecified severe protein-calorie malnutrition: Secondary | ICD-10-CM | POA: Diagnosis not present

## 2021-12-08 ENCOUNTER — Telehealth (HOSPITAL_COMMUNITY): Payer: Self-pay

## 2021-12-08 ENCOUNTER — Other Ambulatory Visit (HOSPITAL_COMMUNITY): Payer: Self-pay

## 2021-12-08 NOTE — Telephone Encounter (Signed)
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 Patient doing well since discharge, no questions about meds at this time.   Medication changes made at discharge:  - START: Eliquis starter pack  Medication changes verified by the patient? Yes    Medication Accessibility:  Home Pharmacy: Not discussed   Was the patient provided with refills on discharged medications? No   Have all prescriptions been transferred from [facility] to home pharmacy? N/A   Is the patient able to afford medications? Has insurance and eligible for copay card for Eliquis    Medication Review:   APIXABAN (ELIQUIS)  Apixaban 10 mg BID initiated on 11/26/21. Will switch to apixaban 5 mg BID after 7 days (DATE 12/03/21).  - Discussed importance of taking medication around the same time everyday  - Advised patient of medications to avoid (NSAIDs, ASA)  - Educated that Tylenol (acetaminophen) will be the preferred analgesic to prevent risk of bleeding  - Emphasized importance of monitoring for signs and symptoms of bleeding (abnormal bruising, prolonged bleeding, nose bleeds, bleeding from gums, discolored urine, black tarry stools)  - Advised patient to alert all providers of anticoagulation therapy prior to starting a new medication or having a procedure   Follow-up Appointments:  [facility] f/u appt confirmed? Scheduled to see Dr. Benay Spice on 12/10/21 @ 2:15pm.   If their condition worsens, is the pt aware to call PCP or go to the Emergency Dept.? Yes  Final Patient Assessment: Patient has f/u scheduled and knows to get refills at f/u

## 2021-12-09 ENCOUNTER — Encounter: Payer: Self-pay | Admitting: *Deleted

## 2021-12-09 NOTE — Progress Notes (Signed)
Spoke with patient in regard to New Patient appt tomorrow with Dr Benay Spice at 2:15. Reviewed directions, parking, mask requirement and one support person allowed in appt.

## 2021-12-10 ENCOUNTER — Inpatient Hospital Stay: Payer: BC Managed Care – PPO | Attending: Oncology | Admitting: Oncology

## 2021-12-10 ENCOUNTER — Encounter: Payer: Self-pay | Admitting: Oncology

## 2021-12-10 ENCOUNTER — Encounter: Payer: Self-pay | Admitting: *Deleted

## 2021-12-10 ENCOUNTER — Other Ambulatory Visit: Payer: Self-pay

## 2021-12-10 ENCOUNTER — Other Ambulatory Visit: Payer: Self-pay | Admitting: *Deleted

## 2021-12-10 VITALS — BP 141/85 | HR 79 | Temp 98.7°F | Resp 20 | Ht 71.0 in | Wt 163.2 lb

## 2021-12-10 DIAGNOSIS — C250 Malignant neoplasm of head of pancreas: Secondary | ICD-10-CM | POA: Insufficient documentation

## 2021-12-10 DIAGNOSIS — Z7901 Long term (current) use of anticoagulants: Secondary | ICD-10-CM | POA: Insufficient documentation

## 2021-12-10 DIAGNOSIS — I82462 Acute embolism and thrombosis of left calf muscular vein: Secondary | ICD-10-CM | POA: Insufficient documentation

## 2021-12-10 DIAGNOSIS — Z8042 Family history of malignant neoplasm of prostate: Secondary | ICD-10-CM | POA: Insufficient documentation

## 2021-12-10 DIAGNOSIS — Z803 Family history of malignant neoplasm of breast: Secondary | ICD-10-CM | POA: Insufficient documentation

## 2021-12-10 DIAGNOSIS — Z5111 Encounter for antineoplastic chemotherapy: Secondary | ICD-10-CM | POA: Insufficient documentation

## 2021-12-10 DIAGNOSIS — Z87891 Personal history of nicotine dependence: Secondary | ICD-10-CM | POA: Insufficient documentation

## 2021-12-10 DIAGNOSIS — Z8719 Personal history of other diseases of the digestive system: Secondary | ICD-10-CM | POA: Insufficient documentation

## 2021-12-10 DIAGNOSIS — Z808 Family history of malignant neoplasm of other organs or systems: Secondary | ICD-10-CM | POA: Insufficient documentation

## 2021-12-10 DIAGNOSIS — Z801 Family history of malignant neoplasm of trachea, bronchus and lung: Secondary | ICD-10-CM | POA: Insufficient documentation

## 2021-12-10 DIAGNOSIS — C259 Malignant neoplasm of pancreas, unspecified: Secondary | ICD-10-CM | POA: Insufficient documentation

## 2021-12-10 NOTE — Progress Notes (Signed)
Foundation one testing request sent on surgical pathology accession number [id number]

## 2021-12-10 NOTE — Progress Notes (Signed)
PATIENT NAVIGATOR PROGRESS NOTE  Name: Dean [name] Date: 12/10/2021 MRN: [id number]  DOB: 1966/03/01   Reason for visit:  Initial Med/Onc visit with Dr Benay Spice  Comments:  Met with Mr and Mrs Fowers today during visit with Dr Benay Spice.  4 weeks post Whipple and he is healing well. Eating and drinking well, tolerating regular diet.    FOLFIRINOX treatment to begin on 1/23. He and his wife will come for Patient chemo ed on 1/16  Dr Barry Dienes is placing PAC on 1/18.  Barriers to care include adjusting to diagnosis, care coordination and possibly transportation although there are several family members that may assist with this.  Referrals placed today for Genetics counseling, Nutrition referral, and offered social work/chaplain referral.    Time spent counseling/coordinating care: > 60 minutes

## 2021-12-10 NOTE — Progress Notes (Signed)
[facility] New Patient Consult   Requesting MD: Stark Klein, [address] [place],  [place] [id number]   CEDAR DITULLIO 56 y.o.  03-13-1966    Reason for Consult: Pancreas cancer   HPI: Dean [name] reports developing jaundice, dark urine, and light-colored stool in October 2022.  He saw his primary provider and was referred for an abdominal ultrasound on 09/16/2021.  This revealed gallbladder sludge.  There was intra and extrahepatic bile duct dilatation. An MRCP on 09/27/2021 revealed marked diffuse intrahepatic bile duct dilatation.  The common duct was dilated with an abrupt termination at the level of the head of the pancreas.  Vague area of hypoenhancement in the head of the pancreas.  No focal liver abnormality.  No abdominal adenopathy. He was referred to Dr. Watt Climes for an ERCP on 10/04/2021.  Stents were placed in the ventral pancreatic duct and common duct.  An endoscopic ultrasound 10/04/2021 by Dr. Rush Landmark revealed a stricture in the lower third of the main bile duct.  A fine-needle biopsy was performed of the pancreas head.  A clear mass was not noted.  No malignant appearing lymph nodes in the celiac, peripancreatic, and porta hepatis regions.  The FNA biopsy revealed atypical cells.  Bile duct brushings revealed no malignant cells.  He was admitted with acute pancreatitis 10/04/2021.  He was discharged on 10/11/2021.  He was referred to Dr. Barry Dienes and was taken to a pancreaticoduodenectomy on 11/12/2021.  A 3 cm mass was noted in the head of the pancreas abutting the portal vein.  No evidence of carcinomatosis.  The pathology revealed an invasive ductal adenocarcinoma the head of the pancreas measuring 2 x 2 x 1.3 cm.  Tumor extended to the SMA and SMV.  Perineural invasion is present.  The pancreatic and common bile duct margins are negative.  Invasive carcinoma is present at the margin of the superior mesenteric artery and vein.  1 of 11  lymph nodes contained metastatic carcinoma.  Mr Flaum was discharged in the hospital 11/27/2021.  He has frequent bowel movements.  He is eating.  He reports improvement in leg swelling after an abdominal drain was removed.  He was diagnosed with a left soleal DVT on 11/26/2021.  He is maintained on apixaban anticoagulation.  Past Medical History:  Diagnosis Date   Bipolar 1 disorder ([place])    GERD (gastroesophageal reflux disease)    Hypothyroidism    Thyroid disease     Past Surgical History:  Procedure Laterality Date   BACK SURGERY     BILIARY BRUSHING  10/04/2021   Procedure: BILIARY BRUSHING;  Surgeon: Clarene Essex, MD;  Location: [facility];  Service: Endoscopy;;   BILIARY STENT PLACEMENT N/A 10/04/2021   Procedure: BILIARY STENT PLACEMENT;  Surgeon: Clarene Essex, MD;  Location: [facility];  Service: Endoscopy;  Laterality: N/A;   ERCP Bilateral 10/04/2021   Procedure: ENDOSCOPIC RETROGRADE CHOLANGIOPANCREATOGRAPHY (ERCP);  Surgeon: Clarene Essex, MD;  Location: [facility];  Service: Endoscopy;  Laterality: Bilateral;   ESOPHAGOGASTRODUODENOSCOPY (EGD) WITH PROPOFOL N/A 10/04/2021   Procedure: ESOPHAGOGASTRODUODENOSCOPY (EGD) WITH PROPOFOL;  Surgeon: Clarene Essex, MD;  Location: [facility];  Service: Endoscopy;  Laterality: N/A;   FINE NEEDLE ASPIRATION  10/04/2021   Procedure: FINE NEEDLE ASPIRATION (FNA) LINEAR;  Surgeon: Clarene Essex, MD;  Location: [facility];  Service: Endoscopy;;   FOREIGN BODY REMOVAL  10/04/2021   Procedure: FOREIGN BODY REMOVAL;  Surgeon: Clarene Essex, MD;  Location: [facility];  Service: Endoscopy;;  KNEE SURGERY     LAPAROSCOPY N/A 11/12/2021   Procedure: DIAGNOSTIC LAPAROSCOPY;  Surgeon: Stark Klein, MD;  Location: [facility];  Service: General;  Laterality: N/A;   NOSE SURGERY     PANCREATIC STENT PLACEMENT  10/04/2021   Procedure: PANCREATIC STENT PLACEMENT;  Surgeon: Clarene Essex, MD;  Location: [facility];  Service: Endoscopy;;   ROTATOR  CUFF REPAIR     SPHINCTEROTOMY  10/04/2021   Procedure: Joan Mayans;  Surgeon: Clarene Essex, MD;  Location: [facility];  Service: Endoscopy;;   STENT REMOVAL  10/04/2021   Procedure: STENT REMOVAL;  Surgeon: Clarene Essex, MD;  Location: [facility];  Service: Endoscopy;;   UPPER ESOPHAGEAL ENDOSCOPIC ULTRASOUND (EUS)  10/04/2021   Procedure: UPPER ESOPHAGEAL ENDOSCOPIC ULTRASOUND (EUS);  Surgeon: Clarene Essex, MD;  Location: [facility];  Service: Endoscopy;;   WHIPPLE PROCEDURE N/A 11/12/2021   Procedure: WHIPPLE PROCEDURE;  Surgeon: Stark Klein, MD;  Location: [facility];  Service: General;  Laterality: N/A;    Medications: Reviewed  Allergies:  Allergies  Allergen Reactions   Erythromycin Nausea Only   Ibuprofen Other (See Comments)    Peptic Ulcer nsaids    Family history: His paternal grandfather and maternal grandmother died of lung cancer.  His father had thyroid cancer.  A paternal aunt had breast cancer.  A paternal uncle died of prostate cancer.  Social History:   He lives with his wife in [place].  He has 2 children.  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  He quit smoking cigarettes in 2012.  He quit alcohol in October 2022 after previously drinking 5-6 beers per day.  No transfusion history.  No risk factor for HIV or hepatitis.  ROS:   Positives include: Frequent bowel movements following surgery, leg swelling improved following removal of an abdominal drain, nosebleed following discharge from the hospital  A complete ROS was otherwise negative.  Physical Exam:  Blood pressure (!) 141/85, pulse 79, temperature 98.7 F (37.1 C), temperature source Oral, resp. rate 20, height 5\' 11"  (1.803 m), weight 163 lb 3.2 oz (74 kg), SpO2 98 %.  HEENT: Oropharynx without visible mass, neck without mass Lungs: Clear bilaterally Cardiac: Regular rate and rhythm Abdomen: No hepatosplenomegaly, no apparent ascites, no mass  Vascular: No leg edema Lymph nodes:  No cervical, supraclavicular, axillary, or inguinal nodes Neurologic: Alert and oriented, motor exam appears intact in the upper and lower extremities bilaterally Skin: No rash Musculoskeletal: No spine tenderness   LAB:  CBC  Lab Results  Component Value Date   WBC 6.7 11/27/2021   HGB 10.1 (L) 11/27/2021   HCT 31.6 (L) 11/27/2021   MCV 104.6 (H) 11/27/2021   PLT 421 (H) 11/27/2021   NEUTROABS 3.6 11/09/2021        CMP  Lab Results  Component Value Date   NA 136 11/27/2021   K 4.3 11/27/2021   CL 101 11/27/2021   CO2 28 11/27/2021   GLUCOSE 111 (H) 11/27/2021   BUN 7 11/27/2021   CREATININE 0.74 11/27/2021   CALCIUM 9.1 11/27/2021   PROT 5.6 (L) 11/21/2021   ALBUMIN 2.0 (L) 11/21/2021   AST 15 11/21/2021   ALT 20 11/21/2021   ALKPHOS 247 (H) 11/21/2021   BILITOT 1.1 11/21/2021   GFRNONAA >60 11/27/2021   GFRAA >60 08/19/2015     Lab Results  Component Value Date   LHT342 85 (H) 10/07/2021    Imaging: As per HPI   Assessment/Plan:   Pancreas cancer, status post  a pancreaticoduodenectomy 11/12/2021, stage IIb (pT1,pN1) 1/11 lymph nodes, perineural invasion positive, positive margin at the SMA and SMV MRCP 09/27/2021-diffuse intrahepatic and common bile duct dilatation with abrupt termination at the level of the head of the pancreas, indistinct area of hypoenhancement in the pancreas head ERCP with placement of pancreatic duct and bile duct stents 10/04/2021 EUS 1 10/04/2021-no pancreas mass identified, FNA of pancreas head-"atypical "cells, no adenopathy CT abdomen/pelvis 10/18/2021-no focal liver abnormality, intra and extrahepatic biliary ductal dilatation with narrowing of the common duct and the pancreas head, no enlarged abdominal lymph nodes Acute pancreatitis 10/04/2021 Bipolar disorder Left soleal DVT 11/26/2021-apixaban   Disposition:   Mr [name] has been diagnosed with stage II adenocarcinoma the pancreas head.  He underwent a  pancreaticoduodenectomy.  I discussed the prognosis and reviewed details of the surgery pathology report with Mr. Heckmann and his wife.  He understands it is possible he is undergone curative surgery, but he is at significant risk of local and systemic relapse based on the positive surgical margin and positive lymph node.  I recommend adjuvant chemotherapy and radiation.  We will make referral to Dr. Lisbeth Renshaw.  We will plan for 6-8 cycles of chemotherapy and then concurrent chemotherapy and radiation.  He appears to be a candidate for adjuvant FOLFIRINOX.  I reviewed potential toxicities associated with the the FOLFIRINOX regimen including the chance of nausea/vomiting, mucositis, alopecia, diarrhea, and hematologic toxicity.  We discussed the sun sensitivity, hyperpigmentation, and hand/foot syndrome associated with 5-fluorouracil.  We reviewed the allergic reaction and various types of neuropathy seen with oxaliplatin.  We discussed the abdominal pain and acute/delayed diarrhea seen with irinotecan.  We discussed the potential for infection and bleeding.  He agrees to proceed with chemotherapy.  He is scheduled for Port-A-Cath placement by Dr. Barry Dienes on 12/22/2021.  He will be scheduled for cycle 1 chemotherapy on 12/27/2021.  He appears to be recovering well from the pancreaticoduodenectomy procedure.  Irinotecan will be held from the first cycle of chemotherapy with the plan to deliver full FOLFIRINOX starting with cycle 2.  He will be referred to the genetics counselor and cancer center nutritionist.  We will submit the resected tumor for Foundation 1 testing.  Mr Tomkinson will return for an office visit prior to cycle 1 chemotherapy on 12/27/2021.  A chemotherapy plan was entered today.  Betsy Coder, MD  12/10/2021, 5:37 PM

## 2021-12-10 NOTE — Progress Notes (Signed)
START ON PATHWAY REGIMEN - Pancreatic Adenocarcinoma     A cycle is every 14 days:     Oxaliplatin      Leucovorin      Irinotecan      Fluorouracil   **Always confirm dose/schedule in your pharmacy ordering system**  Patient Characteristics: Postoperative without Neoadjuvant Therapy (Pathologic Staging), Positive Margins Therapeutic Status: Postoperative without Neoadjuvant Therapy (Pathologic Staging) AJCC T Category: pT1c AJCC N Category: pN1 AJCC M Category: cM0 AJCC 8 Stage Grouping: IIB Intent of Therapy: Curative Intent, Discussed with Patient

## 2021-12-11 ENCOUNTER — Other Ambulatory Visit: Payer: Self-pay | Admitting: *Deleted

## 2021-12-11 DIAGNOSIS — C250 Malignant neoplasm of head of pancreas: Secondary | ICD-10-CM

## 2021-12-11 NOTE — Progress Notes (Signed)
° ° °  Referral for Rad/Onc sent per Dr Benay Spice

## 2021-12-13 ENCOUNTER — Encounter: Payer: Self-pay | Admitting: Oncology

## 2021-12-15 DIAGNOSIS — K831 Obstruction of bile duct: Secondary | ICD-10-CM | POA: Diagnosis not present

## 2021-12-15 DIAGNOSIS — C801 Malignant (primary) neoplasm, unspecified: Secondary | ICD-10-CM | POA: Diagnosis not present

## 2021-12-15 DIAGNOSIS — C250 Malignant neoplasm of head of pancreas: Secondary | ICD-10-CM | POA: Diagnosis not present

## 2021-12-15 DIAGNOSIS — E440 Moderate protein-calorie malnutrition: Secondary | ICD-10-CM | POA: Diagnosis not present

## 2021-12-15 NOTE — Progress Notes (Signed)
GI Location of Tumor / Histology: Pancreas Cancer  Dean [name] presented to his PCP with complaints of dark urine, jaundice, and light colored stool in October 2022.    CT ABD 11/21/2021:   CT ABD 10/05/2021: Status post internal biliary and pancreatic stenting. Pneumobilia in keeping with sphincterotomy and violation of the sphincter of Oddi.  Moderate peripancreatic acute inflammatory fluid in keeping with acute interstitial/edematous pancreatitis. No pancreatic or peripancreatic necrosis identified. The pancreatic duct is not dilated.  Mild pericholecystic inflammatory stranding, nonspecific and possibly related to the adjacent inflammatory process within the pancreas or recent biliary intervention.  EUS 10/04/2021: Stricture in the lower third of the main bile duct.  ERCP 10/04/2021: Stents were placed in the ventral pancreatic duct and common duct.  MR Abd MRCP 09/27/2021: Marked diffuse intrahepatic and common bile duct dilatation.  There is abrupt termination of the common bile duct at the level of the head of pancreas. Indistinct, vague area of relative hypoenhancement within the head of pancreas at the level of the head of pancreas is noted. The diagnosis of exclusion is pancreatic neoplasm. Further evaluation with ERCP/EUS is advised.  Gallbladder sludge. No gallstones or gallbladder wall thickening. No signs of choledocholithiasis.Marked diffuse intrahepatic bile duct dilatation.  ABD Ultrasound 09/16/2021:Extensive gallbladder sludge and possible admixed small calculi.  No signs of cholecystitis.  Intra and extrahepatic bile duct dilatation without visualized cause, suggest MRCP.  Biopsies of Pancreas Mass 11/12/2021    Past/Anticipated interventions by surgeon, if any:  Dr. Barry Dienes -Pancreaticduodenectomy 11/12/2021- 3 cm mass was noted in the head of the pancreas abutting the portal vein.  No evidence of carcinomatosis.  Past/Anticipated interventions by medical  oncology, if any:  Dr. Benay Spice 12/10/2021 - I discussed the prognosis and reviewed details of the surgery pathology report with Dean [name] and his wife.  He understands it is possible he is undergone curative surgery, but he is at significant risk of local and systemic relapse based on the positive surgical margin and positive lymph node. -I recommend adjuvant chemotherapy and radiation.  We will make referral to Dr. Lisbeth Renshaw.  We will plan for 6-8 cycles of chemotherapy and then concurrent chemotherapy and radiation. -He appears to be a candidate for adjuvant FOLFIRINOX. -He is scheduled for Port-A-Cath placement by Dr. Barry Dienes on 12/22/2021.  He will be scheduled for cycle 1 chemotherapy on 12/27/2021.    Weight changes, if any: 30 pounds lost in the past 6 months.  Bowel/Bladder complaints, if any: Notes his stool has lightened in color since the weekend.  He is having soft bowel movements regularly.  Frequent urination at night, up about every 2 hours.   Nausea / Vomiting, if any: No   Pain issues, if any:  Notes some gas pains.  Diet: Avoids greasy and spicy foods.    SAFETY ISSUES: Prior radiation? No Pacemaker/ICD? No Possible current pregnancy? N/a Is the patient on methotrexate? No  Current Complaints/Details: -Port Placement 12/22/2021

## 2021-12-16 ENCOUNTER — Other Ambulatory Visit: Payer: Self-pay

## 2021-12-16 ENCOUNTER — Encounter: Payer: Self-pay | Admitting: Genetic Counselor

## 2021-12-16 ENCOUNTER — Inpatient Hospital Stay: Payer: BC Managed Care – PPO | Admitting: Genetic Counselor

## 2021-12-16 ENCOUNTER — Ambulatory Visit
Admission: RE | Admit: 2021-12-16 | Discharge: 2021-12-16 | Disposition: A | Discharge status: Home / Self Care | Payer: BC Managed Care – PPO | Source: Ambulatory Visit | Attending: Radiation Oncology | Admitting: Radiation Oncology

## 2021-12-16 ENCOUNTER — Inpatient Hospital Stay: Payer: BC Managed Care – PPO

## 2021-12-16 ENCOUNTER — Encounter: Payer: Self-pay | Admitting: Radiation Oncology

## 2021-12-16 VITALS — BP 134/86 | HR 71 | Temp 97.0°F | Resp 18 | Ht 71.0 in | Wt 162.4 lb

## 2021-12-16 DIAGNOSIS — E039 Hypothyroidism, unspecified: Secondary | ICD-10-CM | POA: Diagnosis not present

## 2021-12-16 DIAGNOSIS — M47817 Spondylosis without myelopathy or radiculopathy, lumbosacral region: Secondary | ICD-10-CM | POA: Diagnosis not present

## 2021-12-16 DIAGNOSIS — C250 Malignant neoplasm of head of pancreas: Secondary | ICD-10-CM | POA: Diagnosis not present

## 2021-12-16 DIAGNOSIS — J90 Pleural effusion, not elsewhere classified: Secondary | ICD-10-CM | POA: Insufficient documentation

## 2021-12-16 DIAGNOSIS — Z8719 Personal history of other diseases of the digestive system: Secondary | ICD-10-CM | POA: Insufficient documentation

## 2021-12-16 DIAGNOSIS — I700 Atherosclerosis of aorta: Secondary | ICD-10-CM | POA: Diagnosis not present

## 2021-12-16 DIAGNOSIS — Z87891 Personal history of nicotine dependence: Secondary | ICD-10-CM | POA: Insufficient documentation

## 2021-12-16 DIAGNOSIS — Z803 Family history of malignant neoplasm of breast: Secondary | ICD-10-CM

## 2021-12-16 DIAGNOSIS — K219 Gastro-esophageal reflux disease without esophagitis: Secondary | ICD-10-CM | POA: Insufficient documentation

## 2021-12-16 DIAGNOSIS — Z7901 Long term (current) use of anticoagulants: Secondary | ICD-10-CM | POA: Diagnosis not present

## 2021-12-16 DIAGNOSIS — Z800 Family history of malignant neoplasm of digestive organs: Secondary | ICD-10-CM | POA: Diagnosis not present

## 2021-12-16 DIAGNOSIS — F319 Bipolar disorder, unspecified: Secondary | ICD-10-CM | POA: Insufficient documentation

## 2021-12-16 DIAGNOSIS — Z79899 Other long term (current) drug therapy: Secondary | ICD-10-CM | POA: Insufficient documentation

## 2021-12-16 DIAGNOSIS — Z8042 Family history of malignant neoplasm of prostate: Secondary | ICD-10-CM | POA: Diagnosis not present

## 2021-12-16 HISTORY — DX: Family history of malignant neoplasm of breast: Z80.3

## 2021-12-16 LAB — GENETIC SCREENING ORDER

## 2021-12-16 NOTE — Progress Notes (Signed)
Radiation Oncology         [phone] ________________________________  Name: Dean [name]        MRN: [id number]  Date of Service: 12/16/2021 DOB: May 20, 1966  WU:[id number], Vito Berger, MD     REFERRING PHYSICIAN: Ladell Pier, MD   DIAGNOSIS: The encounter diagnosis was Primary cancer of head of pancreas ([place]).   HISTORY OF PRESENT ILLNESS: Dean [name] is a 56 y.o. male seen at the request of Dr. Benay Spice for a diagnosis of adenocarcinoma of the head of the pancreas. He was diagnosed with atypical cells of the head of pancreas at the time of EGD with EUS by FNA with Dr. Rush Landmark in October 2022. He then developed pancreatitis and underwent diagnostic laparoscopy with Whipple Procedures on 11/12/21. Final pathology showed a 2 cm adenocarcinoma that was moderately differentiated in the head of the pancreas with perineural invasion, and a positive margin at the resection margin of the SMA and SMV. One of 11 lymph nodes were involved. He is seen to discuss chemoradiation after adjuvant FOLFIRINOX that is scheduled to start on 12/27/21     PREVIOUS RADIATION THERAPY: No   PAST MEDICAL HISTORY:  Past Medical History:  Diagnosis Date   Bipolar 1 disorder ([place])    GERD (gastroesophageal reflux disease)    Hypothyroidism    Pancreas cancer ([place]) 11/12/2021   Thyroid disease        PAST SURGICAL HISTORY: Past Surgical History:  Procedure Laterality Date   BACK SURGERY     BILIARY BRUSHING  10/04/2021   Procedure: BILIARY BRUSHING;  Surgeon: Clarene Essex, MD;  Location: [facility];  Service: Endoscopy;;   BILIARY STENT PLACEMENT N/A 10/04/2021   Procedure: BILIARY STENT PLACEMENT;  Surgeon: Clarene Essex, MD;  Location: [facility];  Service: Endoscopy;  Laterality: N/A;   ERCP Bilateral 10/04/2021   Procedure: ENDOSCOPIC RETROGRADE CHOLANGIOPANCREATOGRAPHY (ERCP);  Surgeon: Clarene Essex, MD;  Location: [facility];  Service: Endoscopy;   Laterality: Bilateral;   ESOPHAGOGASTRODUODENOSCOPY (EGD) WITH PROPOFOL N/A 10/04/2021   Procedure: ESOPHAGOGASTRODUODENOSCOPY (EGD) WITH PROPOFOL;  Surgeon: Clarene Essex, MD;  Location: [facility];  Service: Endoscopy;  Laterality: N/A;   FINE NEEDLE ASPIRATION  10/04/2021   Procedure: FINE NEEDLE ASPIRATION (FNA) LINEAR;  Surgeon: Clarene Essex, MD;  Location: [facility];  Service: Endoscopy;;   FOREIGN BODY REMOVAL  10/04/2021   Procedure: FOREIGN BODY REMOVAL;  Surgeon: Clarene Essex, MD;  Location: [facility];  Service: Endoscopy;;   KNEE SURGERY     LAPAROSCOPY N/A 11/12/2021   Procedure: DIAGNOSTIC LAPAROSCOPY;  Surgeon: Stark Klein, MD;  Location: [facility];  Service: General;  Laterality: N/A;   NOSE SURGERY     PANCREATIC STENT PLACEMENT  10/04/2021   Procedure: PANCREATIC STENT PLACEMENT;  Surgeon: Clarene Essex, MD;  Location: [facility];  Service: Endoscopy;;   ROTATOR CUFF REPAIR     SPHINCTEROTOMY  10/04/2021   Procedure: Joan Mayans;  Surgeon: Clarene Essex, MD;  Location: [facility];  Service: Endoscopy;;   STENT REMOVAL  10/04/2021   Procedure: STENT REMOVAL;  Surgeon: Clarene Essex, MD;  Location: [facility];  Service: Endoscopy;;   UPPER ESOPHAGEAL ENDOSCOPIC ULTRASOUND (EUS)  10/04/2021   Procedure: UPPER ESOPHAGEAL ENDOSCOPIC ULTRASOUND (EUS);  Surgeon: Clarene Essex, MD;  Location: [facility];  Service: Endoscopy;;   WHIPPLE PROCEDURE N/A 11/12/2021   Procedure: WHIPPLE PROCEDURE;  Surgeon: Stark Klein, MD;  Location: [facility];  Service: General;  Laterality: N/A;     FAMILY  HISTORY: History reviewed. No pertinent family history.   SOCIAL HISTORY:  reports that he has quit smoking. He has never used smokeless tobacco. He reports that he does not currently use alcohol. He reports that he does not use drugs. The patient is married and lives in [place]. 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 He and his wife have two adult children.     ALLERGIES: Erythromycin and Ibuprofen   MEDICATIONS:  Current Outpatient Medications  Medication Sig Dispense Refill   ALPRAZolam (XANAX) 0.5 MG tablet Take 1 tablet (0.5 mg total) by mouth at bedtime as needed for anxiety. 30 tablet 5   [START ON 12/26/2021] apixaban (ELIQUIS) 5 MG TABS tablet Take 1 tablet (5 mg total) by mouth 2 (two) times daily. Start after starter pack completed 60 tablet 1   divalproex (DEPAKOTE ER) 500 MG 24 hr tablet TAKE 5 TABLETS (2,500 MG TOTAL) BY MOUTH DAILY. (Patient taking differently: Take 2,000 mg by mouth at bedtime.) 450 tablet 3   levothyroxine (SYNTHROID) 137 MCG tablet Take 137 mcg by mouth daily before breakfast.     pantoprazole (PROTONIX) 40 MG tablet Take 40 mg by mouth daily.     Multiple Vitamins-Minerals (ONE DAILY MENS 50+ MULTIVIT) TABS Take 1 tablet by mouth daily. (Patient not taking: Reported on 12/16/2021)     No current facility-administered medications for this encounter.     REVIEW OF SYSTEMS: On review of systems, the patient reports that he is doing pretty well since surgery. He has some fullness at times in the upper aspect of his incision site, and has had about a 30 pound weight loss in the last 6 months. He describes lighter colored stool but no fatty changes or diarrhea. He has nocturia and some occasional gas pains, but no nausea or vomiting. No other complaints are verbalized.      PHYSICAL EXAM:  Wt Readings from Last 3 Encounters:  12/16/21 162 lb 6 oz (73.7 kg)  12/10/21 163 lb 3.2 oz (74 kg)  11/12/21 173 lb (78.5 kg)   Temp Readings from Last 3 Encounters:  12/16/21 (!) 97 F (36.1 C) (Temporal)  12/10/21 98.7 F (37.1 C) (Oral)  11/27/21 98.1 F (36.7 C) (Oral)   BP Readings from Last 3 Encounters:  12/16/21 134/86  12/10/21 (!) 141/85  11/27/21 138/81   Pulse Readings from Last 3 Encounters:  12/16/21 71  12/10/21 79  11/27/21 65   Pain Assessment Pain Score: 0-No pain/10  In general this is a  well appearing male in no acute distress. He's alert and oriented x4 and appropriate throughout the examination. Cardiopulmonary assessment is negative for acute distress and he exhibits normal effort. His midline laparotomy incision site is well healed without erythema, separation, or drainage.    ECOG = 1  0 - Asymptomatic (Fully active, able to carry on all predisease activities without restriction)  1 - Symptomatic but completely ambulatory (Restricted in physically strenuous activity but ambulatory and able to carry out work of a light or sedentary nature. For example, light housework, office work)  2 - Symptomatic, <50% in bed during the day (Ambulatory and capable of all self care but unable to carry out any work activities. Up and about more than 50% of waking hours)  3 - Symptomatic, >50% in bed, but not bedbound (Capable of only limited self-care, confined to bed or chair 50% or more of waking hours)  4 - Bedbound (Completely disabled. Cannot carry on  any self-care. Totally confined to bed or chair)  5 - Death   Eustace Pen MM, Creech RH, Tormey DC, et al. [phone]). "Toxicity and response criteria of the [facility]". [place] Oncol. 5 (6): 649-55    LABORATORY DATA:  Lab Results  Component Value Date   WBC 6.7 11/27/2021   HGB 10.1 (L) 11/27/2021   HCT 31.6 (L) 11/27/2021   MCV 104.6 (H) 11/27/2021   PLT 421 (H) 11/27/2021   Lab Results  Component Value Date   NA 136 11/27/2021   K 4.3 11/27/2021   CL 101 11/27/2021   CO2 28 11/27/2021   Lab Results  Component Value Date   ALT 20 11/21/2021   AST 15 11/21/2021   ALKPHOS 247 (H) 11/21/2021   BILITOT 1.1 11/21/2021      RADIOGRAPHY: CT ABDOMEN PELVIS W CONTRAST  Result Date: 11/21/2021 CLINICAL DATA:  Status post Whipple, possible leak. EXAM: CT ABDOMEN AND PELVIS WITH CONTRAST TECHNIQUE: Multidetector CT imaging of the abdomen and pelvis was performed using the standard protocol following  bolus administration of intravenous contrast. CONTRAST:  19m OMNIPAQUE IOHEXOL 300 MG/ML  SOLN COMPARISON:  CT abdomen and pelvis 10/18/2021. FINDINGS: Lower chest: There are new small bilateral pleural effusions with bilateral lower lobe atelectasis. Hepatobiliary: No focal liver lesions are identified. The gallbladder is now surgically absent. There has been interval resolution of intra and extrahepatic biliary ductal dilatation. There is new pneumobilia in the left lobe of the liver. Pancreas: Patient is status post Whipple procedure. Small stent is seen between the pancreatic body and adjacent small bowel. There is no pancreatic ductal dilatation. The pancreatic body and tail appear within normal limits. Spleen: Normal in size without focal abnormality. Adrenals/Urinary Tract: There is a small amount of air in the bladder. The kidneys and adrenal glands are within normal limits. Stomach/Bowel: Patient is status post Whipple procedure. There is a small amount of free fluid and air in the surgical bed which appears within normal limits. Gastrojejunostomy appears within normal limits with some mild wall thickening of the jejunum at the anastomosis. There is a large air contrast level within the stomach. Left-sided small bowel loops are dilated measuring up to 3.7 cm. Oral contrast reaches the distal ileum. Definitive transition point not visualized. The colon appears within normal limits. The appendix is not visualized. There is marked wall thickening of the duodenum with surrounding inflammatory compatible with recent surgery. There is an air-fluid collection abutting the pancreatic head and duodenum just inferior to the portacaval region measuring 2.7 by 3.1 x 4.0 cm image 3/33. Vascular/Lymphatic: Aortic atherosclerosis. No enlarged abdominal or pelvic lymph nodes. Reproductive: Prostate is unremarkable. Other: There is small volume ascites throughout the abdomen and pelvis. Percutaneous drainage catheter seen  entering the right abdomen with distal portion in the surgical bed. The distal portion of the catheter abuts the previously mentioned fluid collection, but does not definitely enter the fluid collection. There is diffuse body wall edema. Midline skin staples are present. Musculoskeletal: There are degenerative changes at L5-S1. IMPRESSION: 1. Status post Whipple procedure with small amount of expected fluid and free air in the surgical bed. 2. Additionally, there is an air-fluid collection in the surgical bed measuring 2.7 x 3.1 x 4.0 cm, indeterminate. The percutaneous drainage catheter abuts the anterior margin of the collection, but does not definitely enter the collection. 3. Gastrojejunostomy appears patent. There is mild dilatation of the stomach and proximal small bowel favored as ileus. Oral contrast  reaches distal ileum. 4. Cholecystectomy. New pneumobilia compatible with recent Whipple's procedure. 5. Remaining pancreatic body and tail appear within normal limits. 6. Small volume ascites. 7. Small pleural effusions and bibasilar atelectasis. 8. Body wall edema. 9. Small amount air in the bladder. Electronically Signed   By: Ronney Asters M.D.   On: 11/21/2021 23:21   VAS [place] LOWER EXTREMITY VENOUS (DVT)  Result Date: 11/28/2021  Lower Venous DVT Study Patient Name:  Dean [name]  Date of Exam:   11/26/2021 Medical Rec #: [id number]              Accession #:    [id number] Date of Birth: 1965-12-08               Patient Gender: M Patient Age:   41 years Exam Location:  [facility] Procedure:      VAS [place] LOWER EXTREMITY VENOUS (DVT) Referring Phys: Jana Half [facility] --------------------------------------------------------------------------------  Indications: Edema, RT>LT.  Comparison Study: No prior studies. Performing Technologist: Darlin Coco RDMS, RVT  Examination Guidelines: A complete evaluation includes B-mode imaging, spectral Doppler, color Doppler, and power Doppler as needed of all  accessible portions of each vessel. Bilateral testing is considered an integral part of a complete examination. Limited examinations for reoccurring indications may be performed as noted. The reflux portion of the exam is performed with the patient in reverse Trendelenburg.  +---------+---------------+---------+-----------+----------+--------------+  RIGHT     Compressibility Phasicity Spontaneity Properties Thrombus Aging  +---------+---------------+---------+-----------+----------+--------------+  CFV       Full            Yes       Yes                                    +---------+---------------+---------+-----------+----------+--------------+  SFJ       Full                                                             +---------+---------------+---------+-----------+----------+--------------+  FV Prox   Full                                                             +---------+---------------+---------+-----------+----------+--------------+  FV Mid    Full                                                             +---------+---------------+---------+-----------+----------+--------------+  FV Distal Full                                                             +---------+---------------+---------+-----------+----------+--------------+  PFV       Full                                                             +---------+---------------+---------+-----------+----------+--------------+  POP       Full            Yes       Yes                                    +---------+---------------+---------+-----------+----------+--------------+  PTV       Full                                                             +---------+---------------+---------+-----------+----------+--------------+  PERO      Full                                                             +---------+---------------+---------+-----------+----------+--------------+  Soleal    Full                                                              +---------+---------------+---------+-----------+----------+--------------+  Gastroc   Full                                                             +---------+---------------+---------+-----------+----------+--------------+   +---------+---------------+---------+-----------+----------+--------------+  LEFT      Compressibility Phasicity Spontaneity Properties Thrombus Aging  +---------+---------------+---------+-----------+----------+--------------+  CFV       Full            Yes       Yes                                    +---------+---------------+---------+-----------+----------+--------------+  SFJ       Full                                                             +---------+---------------+---------+-----------+----------+--------------+  FV Prox   Full                                                             +---------+---------------+---------+-----------+----------+--------------+  FV Mid    Full                                                             +---------+---------------+---------+-----------+----------+--------------+  FV Distal Full                                                             +---------+---------------+---------+-----------+----------+--------------+  PFV       Full                                                             +---------+---------------+---------+-----------+----------+--------------+  POP       Full            Yes       Yes                                    +---------+---------------+---------+-----------+----------+--------------+  PTV       Full                                                             +---------+---------------+---------+-----------+----------+--------------+  PERO      Full                                                             +---------+---------------+---------+-----------+----------+--------------+  Soleal    None            No        No                     Acute            +---------+---------------+---------+-----------+----------+--------------+  Gastroc   Full                                                             +---------+---------------+---------+-----------+----------+--------------+     Summary: RIGHT: - There is no evidence of deep vein thrombosis in the lower extremity.  - No cystic structure found in the popliteal fossa.  LEFT: - Findings consistent with acute deep vein thrombosis involving the left soleal veins. - No cystic structure found in the popliteal fossa.  *See table(s) above for measurements and observations. Electronically signed by Jamelle Haring on 11/28/2021 at 3:23:03 PM.    Final        IMPRESSION/PLAN: 1. Stage IIB, [id number], adenocarcinoma of the head of pancreas. Dr. Lisbeth Renshaw discusses the pathology findings and reviews the nature of pancreatic disease. He is aware of the benefits of having undergone surgery, and has met with Dr. Benay Spice regarding the pathologic findings and risks of recurrence. He has been offered adjuvant chemotherapy for 6-8  cycles followed by chemoradiation. Dr. Lisbeth Renshaw discusses the rationale for chemoRT at the appropriate time. We discussed the risks, benefits, short, and long term effects of radiotherapy, as well as the curative intent, and the patient is interested in proceeding. Dr. Lisbeth Renshaw discusses the delivery and logistics of radiotherapy and anticipates a course of 5 1/2 weeks of radiotherapy to the pancreatic fossa. We will coordinate simulation at the time of his next visit. Written consent is obtained and placed in the chart, a copy was provided to the patient.    The above documentation reflects my direct findings during this shared patient visit. Please see the separate note by Dr. Lisbeth Renshaw on this date for the remainder of the patient's plan of care.    Carola Rhine, [facility]   **Disclaimer: This note was dictated with voice recognition software. Similar sounding words can inadvertently be transcribed and  this note may contain transcription errors which may not have been corrected upon publication of note.**

## 2021-12-16 NOTE — Progress Notes (Signed)
REFERRING PROVIDER: °Sherrill, Gary B, MD °[address] °[place],  [place] [address] ° °PRIMARY PROVIDER:  °Scifres, Dorothy, PA-C ° °PRIMARY REASON FOR VISIT:  °1. Primary cancer of head of pancreas (HCC)   °2. Family history of breast cancer   ° ° °HISTORY OF PRESENT ILLNESS:   °Dean [name], a 55 y.o. male, was seen for a [facility] cancer genetics consultation at the request of Dr. Sherrill due to a personal history of pancreatic cancer.  Dean [name] presents to clinic today to discuss the possibility of a hereditary predisposition to cancer, to discuss genetic testing, and to further clarify his future cancer risks, as well as potential cancer risks for family members.  ° °At the age of 55, Dean [name] was diagnosed with adenocarcinoma of the head of the pancreas.  °CANCER HISTORY:  °Oncology History  °Primary cancer of head of pancreas (HCC)  °12/10/2021 Initial Diagnosis  ° Primary cancer of head of pancreas (HCC) °  °12/10/2021 Cancer Staging  ° Staging form: Exocrine Pancreas, AJCC 8th Edition °- Clinical: Stage IIB (cT1c, cN1, cM0) - Signed by Sherrill, Gary B, MD on 12/10/2021 °Histologic grade (G): G2 °Histologic grading system: 3 grade system °Perineural invasion (PNI): Present ° °  °12/27/2021 -  Chemotherapy  ° Patient is on Treatment Plan : PANCREAS Modified FOLFIRINOX q14d x 12 cycles  °   ° ° ° °RISK FACTORS:  °Colonoscopy: yes;  9 reported lifetime polyps . °No reported history of DM or chronic pancreatitis.  ° °Past Medical History:  °Diagnosis Date  ° Bipolar 1 disorder (HCC)   ° Family history of breast cancer 12/16/2021  ° GERD (gastroesophageal reflux disease)   ° Hypothyroidism   ° Pancreas cancer (HCC) 11/12/2021  ° Thyroid disease   ° ° °Past Surgical History:  °Procedure Laterality Date  ° BACK SURGERY    ° BILIARY BRUSHING  10/04/2021  ° Procedure: BILIARY BRUSHING;  Surgeon: Magod, Marc, MD;  Location: [facility];  Service: Endoscopy;;  ° BILIARY STENT PLACEMENT N/A 10/04/2021   ° Procedure: BILIARY STENT PLACEMENT;  Surgeon: Magod, Marc, MD;  Location: [facility];  Service: Endoscopy;  Laterality: N/A;  ° ERCP Bilateral 10/04/2021  ° Procedure: ENDOSCOPIC RETROGRADE CHOLANGIOPANCREATOGRAPHY (ERCP);  Surgeon: Magod, Marc, MD;  Location: [facility];  Service: Endoscopy;  Laterality: Bilateral;  ° ESOPHAGOGASTRODUODENOSCOPY (EGD) WITH PROPOFOL N/A 10/04/2021  ° Procedure: ESOPHAGOGASTRODUODENOSCOPY (EGD) WITH PROPOFOL;  Surgeon: Magod, Marc, MD;  Location: [facility];  Service: Endoscopy;  Laterality: N/A;  ° FINE NEEDLE ASPIRATION  10/04/2021  ° Procedure: FINE NEEDLE ASPIRATION (FNA) LINEAR;  Surgeon: Magod, Marc, MD;  Location: [facility];  Service: Endoscopy;;  ° FOREIGN BODY REMOVAL  10/04/2021  ° Procedure: FOREIGN BODY REMOVAL;  Surgeon: Magod, Marc, MD;  Location: [facility];  Service: Endoscopy;;  ° KNEE SURGERY    ° LAPAROSCOPY N/A 11/12/2021  ° Procedure: DIAGNOSTIC LAPAROSCOPY;  Surgeon: Byerly, Faera, MD;  Location: [facility];  Service: General;  Laterality: N/A;  ° NOSE SURGERY    ° PANCREATIC STENT PLACEMENT  10/04/2021  ° Procedure: PANCREATIC STENT PLACEMENT;  Surgeon: Magod, Marc, MD;  Location: [facility];  Service: Endoscopy;;  ° ROTATOR CUFF REPAIR    ° SPHINCTEROTOMY  10/04/2021  ° Procedure: SPHINCTEROTOMY;  Surgeon: Magod, Marc, MD;  Location: [facility];  Service: Endoscopy;;  ° STENT REMOVAL  10/04/2021  ° Procedure: STENT REMOVAL;  Surgeon: Magod, Marc, MD;  Location: [facility];  Service: Endoscopy;;  ° UPPER ESOPHAGEAL ENDOSCOPIC ULTRASOUND (EUS)    10/04/2021  ° Procedure: UPPER ESOPHAGEAL ENDOSCOPIC ULTRASOUND (EUS);  Surgeon: Magod, Marc, MD;  Location: [facility];  Service: Endoscopy;;  ° WHIPPLE PROCEDURE N/A 11/12/2021  ° Procedure: WHIPPLE PROCEDURE;  Surgeon: Byerly, Faera, MD;  Location: [facility];  Service: General;  Laterality: N/A;  ° ° °FAMILY HISTORY:  °We obtained a detailed, 4-generation family history.  Significant diagnoses are listed  below: °Family History  °Problem Relation Age of Onset  ° Thyroid cancer Father   °     dx unknown age  ° Cancer Maternal Uncle   °     x2 mat uncles; unknown type;  ?pancreatic/prostate; dx after 50  ° Breast cancer Paternal Aunt   °     dx after 50  ° Cancer Paternal Aunt   °     x2 paternal aunts; unknown type  ° Lung cancer Maternal Grandmother   °     dx after 50; smoking hx  ° Lung cancer Maternal Grandfather   °     dx after 50; smoking hx  ° ° ° °Dean [name] is unaware of previous family history of genetic testing for hereditary cancer risks. There is no reported Ashkenazi Jewish ancestry. There is no known consanguinity. ° °GENETIC COUNSELING ASSESSMENT: Dean [name] is a 55 y.o. male with a personal history of cancer which is somewhat suggestive of a hereditary cancer syndrome and predisposition to cancer given his diagnosis of pancreatic cancer and the presence of related cancers in the family. We, therefore, discussed and recommended the following at today's visit.  ° °DISCUSSION: We discussed that 5 - 10% of cancer is hereditary.  Most cases of hereditary pancreatic cancer are associated with mutations in BRCA1/2.  There are other genes that can be associated with hereditary pancreatic cancer syndromes.  We discussed that testing is beneficial for several reasons including knowing how to follow individuals for their cancer risks, identifying whether potential treatment options, such as PARP inhibitors, would be beneficial, and understanding if other family members could be at risk for cancer and allowing them to undergo genetic testing.  ° °We reviewed the characteristics, features and inheritance patterns of hereditary cancer syndromes. We also discussed genetic testing, including the appropriate family members to test, the process of testing, insurance coverage and turn-around-time for results. We discussed the implications of a negative, positive, carrier and/or variant of uncertain significant  result. We recommended Dean [name] pursue genetic testing for a panel that includes genes associated with pancreatic cancer.  ° °The CancerNext-Expanded gene panel offered by [facility] and includes sequencing, rearrangement, and RNA analysis for the following 77 genes: AIP, ALK, APC, ATM, AXIN2, BAP1, BARD1, BLM, BMPR1A, BRCA1, BRCA2, BRIP1, CDC73, CDH1, CDK4, CDKN1B, CDKN2A, CHEK2, CTNNA1, DICER1, FANCC, FH, FLCN, GALNT12, KIF1B, LZTR1, MAX, MEN1, MET, MLH1, MSH2, MSH3, MSH6, MUTYH, NBN, NF1, NF2, NTHL1, PALB2, PHOX2B, PMS2, POT1, PRKAR1A, PTCH1, PTEN, RAD51C, RAD51D, RB1, RECQL, RET, SDHA, SDHAF2, SDHB, SDHC, SDHD, SMAD4, SMARCA4, SMARCB1, SMARCE1, STK11, SUFU, TMEM127, TP53, TSC1, TSC2, VHL and XRCC2 (sequencing and deletion/duplication); EGFR, EGLN1, HOXB13, KIT, MITF, PDGFRA, POLD1, and POLE (sequencing only); EPCAM and GREM1 (deletion/duplication only).  ° °Based on Dean [name]'s personal history of pancreatic cancer, he meets medical criteria for genetic testing. Despite that he meets criteria, he may still have an out of pocket cost. We discussed that if his out of pocket cost for testing is over $100, the laboratory should contact him and discuss the self-pay prices and/or patient pay assistance programs.   ° °  PLAN: After considering the risks, benefits, and limitations, Dean [name] provided informed consent to pursue genetic testing and the blood sample was sent to [facility] for analysis of the CancerNext-Expanded +RNAinsight Panel. Results should be available within approximately 3 weeks' time, at which point they will be disclosed by telephone to Dean [name], as will any additional recommendations warranted by these results. Dean [name] will receive a summary of his genetic counseling visit and a copy of his results once available. This information will also be available in Epic.  ° °Lastly, we encouraged Dean [name] to remain in contact with cancer genetics annually so  that we can continuously update the family history and inform him of any changes in cancer genetics and testing that may be of benefit for this family.  ° °Dean [name]'s questions were answered to his satisfaction today. Our contact information was provided should additional questions or concerns arise. Thank you for the referral and allowing us to share in the care of your patient.  ° °Cari M. Koerner, MS, LCGC °Genetic Counselor °[email or web address] °(P) [phone] ° °The patient was seen for a total of 35 minutes in face-to-face genetic counseling.  The patient was seen alone.  Drs. Gudena and/or Feng were available to discuss this case as needed.  ° ° °_______________________________________________________________________ °For Office Staff:  °Number of people involved in session: 1 °Was an Intern/ student involved with case: no ° °

## 2021-12-18 ENCOUNTER — Other Ambulatory Visit: Payer: Self-pay | Admitting: Psychiatry

## 2021-12-20 ENCOUNTER — Inpatient Hospital Stay: Payer: BC Managed Care – PPO

## 2021-12-20 ENCOUNTER — Other Ambulatory Visit: Payer: Self-pay

## 2021-12-20 ENCOUNTER — Telehealth: Payer: Self-pay

## 2021-12-20 DIAGNOSIS — Z803 Family history of malignant neoplasm of breast: Secondary | ICD-10-CM | POA: Diagnosis not present

## 2021-12-20 DIAGNOSIS — Z808 Family history of malignant neoplasm of other organs or systems: Secondary | ICD-10-CM | POA: Diagnosis not present

## 2021-12-20 DIAGNOSIS — Z87891 Personal history of nicotine dependence: Secondary | ICD-10-CM | POA: Diagnosis not present

## 2021-12-20 DIAGNOSIS — Z8042 Family history of malignant neoplasm of prostate: Secondary | ICD-10-CM | POA: Diagnosis not present

## 2021-12-20 DIAGNOSIS — C250 Malignant neoplasm of head of pancreas: Secondary | ICD-10-CM | POA: Diagnosis not present

## 2021-12-20 DIAGNOSIS — I82462 Acute embolism and thrombosis of left calf muscular vein: Secondary | ICD-10-CM | POA: Diagnosis not present

## 2021-12-20 DIAGNOSIS — Z5111 Encounter for antineoplastic chemotherapy: Secondary | ICD-10-CM | POA: Diagnosis not present

## 2021-12-20 DIAGNOSIS — Z8719 Personal history of other diseases of the digestive system: Secondary | ICD-10-CM | POA: Diagnosis not present

## 2021-12-20 DIAGNOSIS — C259 Malignant neoplasm of pancreas, unspecified: Secondary | ICD-10-CM | POA: Diagnosis not present

## 2021-12-20 DIAGNOSIS — Z801 Family history of malignant neoplasm of trachea, bronchus and lung: Secondary | ICD-10-CM | POA: Diagnosis not present

## 2021-12-20 DIAGNOSIS — Z7901 Long term (current) use of anticoagulants: Secondary | ICD-10-CM | POA: Diagnosis not present

## 2021-12-20 LAB — CBC WITH DIFFERENTIAL (CANCER CENTER ONLY)
Abs Immature Granulocytes: 0 10*3/uL (ref 0.00–0.07)
Basophils Absolute: 0 10*3/uL (ref 0.0–0.1)
Basophils Relative: 1 %
Eosinophils Absolute: 0.2 10*3/uL (ref 0.0–0.5)
Eosinophils Relative: 4 %
HCT: 39 % (ref 39.0–52.0)
Hemoglobin: 12.9 g/dL — ABNORMAL LOW (ref 13.0–17.0)
Immature Granulocytes: 0 %
Lymphocytes Relative: 51 %
Lymphs Abs: 2.2 10*3/uL (ref 0.7–4.0)
MCH: 33.6 pg (ref 26.0–34.0)
MCHC: 33.1 g/dL (ref 30.0–36.0)
MCV: 101.6 fL — ABNORMAL HIGH (ref 80.0–100.0)
Monocytes Absolute: 0.5 10*3/uL (ref 0.1–1.0)
Monocytes Relative: 11 %
Neutro Abs: 1.4 10*3/uL — ABNORMAL LOW (ref 1.7–7.7)
Neutrophils Relative %: 33 %
Platelet Count: 171 10*3/uL (ref 150–400)
RBC: 3.84 MIL/uL — ABNORMAL LOW (ref 4.22–5.81)
RDW: 14 % (ref 11.5–15.5)
WBC Count: 4.2 10*3/uL (ref 4.0–10.5)
nRBC: 0 % (ref 0.0–0.2)

## 2021-12-20 LAB — MAGNESIUM: Magnesium: 1.8 mg/dL (ref 1.7–2.4)

## 2021-12-20 LAB — CMP (CANCER CENTER ONLY)
ALT: 52 U/L — ABNORMAL HIGH (ref 0–44)
AST: 42 U/L — ABNORMAL HIGH (ref 15–41)
Albumin: 4 g/dL (ref 3.5–5.0)
Alkaline Phosphatase: 194 U/L — ABNORMAL HIGH (ref 38–126)
Anion gap: 5 (ref 5–15)
BUN: 15 mg/dL (ref 6–20)
CO2: 29 mmol/L (ref 22–32)
Calcium: 9.9 mg/dL (ref 8.9–10.3)
Chloride: 104 mmol/L (ref 98–111)
Creatinine: 0.69 mg/dL (ref 0.61–1.24)
GFR, Estimated: >60 mL/min (ref >60)
Glucose, Bld: 84 mg/dL (ref 70–99)
Potassium: 4.7 mmol/L (ref 3.5–5.1)
Sodium: 138 mmol/L (ref 135–145)
Total Bilirubin: 0.6 mg/dL (ref 0.3–1.2)
Total Protein: 7.2 g/dL (ref 6.5–8.1)

## 2021-12-20 LAB — VITAMIN B12: Vitamin B-12: 703 pg/mL (ref 180–914)

## 2021-12-20 MED ORDER — ONDANSETRON HCL 8 MG PO TABS: 8.0000 mg | ORAL_TABLET | Freq: Three times a day (TID) | ORAL | 1 refills | Status: DC | PRN

## 2021-12-20 MED ORDER — PROCHLORPERAZINE MALEATE 10 MG PO TABS: 10.0000 mg | ORAL_TABLET | Freq: Four times a day (QID) | ORAL | 1 refills | Status: DC | PRN

## 2021-12-20 MED ORDER — LIDOCAINE-PRILOCAINE 2.5-2.5 % EX CREA: 1.0000 "application " | TOPICAL_CREAM | CUTANEOUS | 2 refills | Status: AC | PRN

## 2021-12-20 NOTE — Telephone Encounter (Signed)
Labs put in for 12/27/21 appointment.

## 2021-12-20 NOTE — Telephone Encounter (Signed)
-----   Message from Ladell Pier, MD sent at 12/20/2021  4:34 PM EST ----- Repeat CBC/differential and CMP on 12/27/2021

## 2021-12-21 ENCOUNTER — Encounter (HOSPITAL_COMMUNITY): Payer: Self-pay | Admitting: General Surgery

## 2021-12-21 NOTE — H&P (Signed)
PROVIDER:  Georgianne Fick, MD   MRN: [id number] DOB: 1966-03-29 DATE OF ENCOUNTER: 12/03/2021 Initial History:       Dean [name] [name] a 56 yo M who developed elevated LFTs and had intra/extrahepatic biliary dilation noted in mid October 2022 on RUQ u/s.  Dean [name] subsequently had an MRCP demonstrating a stricture in the distal common bile duct in the pancreas. Imaging has been vague regarding whether there [name] a true mass present.  This includes MRI and EUS as well as CT (regular contrasted CT, not triple phase).  Dean [name] was admitted 10/31 after Dean [name] developed post ERCP pancreatitis.  Dean [name] t bili [name] coming down from 29.2 to 17.1.  Dean [name] has no family history of cancer or pancreatitis of which Dean [name] aware..  Dean [name] had about a 15-20 pound weight loss since Dean [name] was seen to be jaundiced.   Dean [name] works as a Librarian, academic in Water quality scientist. Dean [name] never had pancreatitis before.  Dean [name] drank around 6 beers per day for around 18 months prior to this dx.     Dean [name] was discharged from the hospital 10/11/2021.  Dean [name] pancreatitis [name] improved.  Dean [name] had CT pancreatic protocol that showed resolution of pancreatitis, demonstrated stricture, but also didn't show a mass. Dean [name] pain resolved.  Dean [name] still a bit tired.  Dean [name] jaundice did not completely resolve, but was improved.  Repeat labs were improved at [facility].  CA 19-9 was 113. T bili was 7.7, Alb 3.6, Cr normal.     Interval history: Dean [name] had dx laparoscopy and whipple 11/12/21.  Dean [name] did well overall other than a pancreatic leak managed with antibiotics and the surgical drain still in place.  Dean [name] was discharged 11/27/21 in stable condition.     Since discharge, Dean [name] drain has stopped putting out foul liquid for for the last 4 days.  Dean [name] eating better.  Bloating has improved.  Dean [name] no longer taking any pain medication other than Tylenol.  Dean [name] denies nausea or vomiting.  Dean [name] increasing Dean [name] oral intake.  Dean [name] wife confirms this.  Dean [name] having irregular stools.  Dean [name] denies any significant issues with diarrhea or greasy  bowel movements.  However several days from Dean [name] bowel movements appeared pale, but this [name] resolved.  Dean [name] having some constipation.  Dean [name] not having any fevers or chills.   Dean [name] did unfortunately have a diagnosis of pancreatic cancer with final path pT1cN1 and R1 resection.     Pathology whipple 11/12/21 A. WHIPPLE:  Invasive ductal adenocarcinoma in the head of pancreas with positive  margins along the superior mesenteric artery and vein and one positive  lymph node.  Please see the following synoptic report.   ONCOLOGY TABLE:   A.  PANCREAS (EXOCRINE), CARCINOMA: Resection   Procedure: Pancreaticoduodenectomy (Whipple's procedure) with  cholecystectomy.  Tumor Site: Head of pancreas.  Tumor Size: 2.0 CM. X 2.0 CM. X 1.3 CM.  Histologic Type: Ductal adenocarcinoma.  Histologic Grade: Moderately differentiated (Grade 2).  Tumor Extension: Superior mesenteric artery and superior mesenteric  vein.  Treatment Effect: No known presurgical therapy  Lymphovascular Invasion: Not identified.  Perineural Invasion: Positive.  Margins: Pancreatic margin of resection and  margin of resection of  common bile duct are free of carcinoma but invasive carcinoma [name] present  at the margin of resection of superior mesenteric artery and vein.  Margin Status for Invasive Carcinoma: Carcinoma [name] present along the  margin of resection of superior mesenteric artery and superior  mesenteric vein.  Regional Lymph Nodes:       Number of Lymph Nodes with Tumor: One (1).       Number of Lymph Nodes Examined: Eleven (11).  Distant Metastasis:       Distant Site(s) Involved: None known.  Pathologic Stage Classification (pTNM, AJCC 8th Edition): pT1c, pN1.  Ancillary Studies: Can be performed upon request.  Representative Tumor Block: A9.  Comment(s): None.  Additional findings: Chronic acalculous cholecystitis.     Physical Examination:    Pale Anicteric Abd soft, non distended. Wound well  healed Drain scant amount in tube, none in drainage bag.    Assessment and Plan:     Dean [name] [name] a 56 y.o. male who underwent whipple on 11/12/21.   Diagnoses and all orders for this visit:   Adenocarcinoma of head of pancreas (CMS-HCC) Assessment & Plan: Leak appears to have improved. Given tachycardia, I will recheck labs.   Will d/c drain as it [name] no longer functional.     Dean [name] has appointment with oncology (sherrill) 12/10/21 to discuss adjuvant treatment.     I will set Dean [name] up tentatively for a port.     Orders: -     CBC with Diff, Platelet, NLR - [facility] -     Comprehensive Metabolic Panel (CMP) -     Prealbumin - [facility]   Severe protein-calorie malnutrition (CMS-HCC) Assessment & Plan: Dean [name] will see nutrition at the cancer center.     Orders: -     CBC with Diff, Platelet, NLR - [facility] -     Comprehensive Metabolic Panel (CMP) -     Prealbumin - [facility]   Constipation, unspecified constipation type Assessment & Plan: Advised to take one capful of miralax daily.          Return in about 4 weeks (around 12/31/2021).

## 2021-12-21 NOTE — Progress Notes (Addendum)
Dean [name] denies chest pain or shortness of breath.  Patient denies having any s/s of Covid in his household.  Patient denies any known exposure to Covid.   PCP is Dorthory Engineer, production, PA-C.  Dean [name] states that last dose of Eliquis was 12/19/21, patient takes it for a DVT diagnosed 11/2021 after surgery.  I instructed Dean [name] to shower with antibiotic soap, if it is available.  Dry off with a clean towel. Do not put lotion, powder, cologne or deodorant or makeup.No jewelry or piercings. Men may shave their face and neck. Woman should not shave. No nail polish, artificial or acrylic nails. Wear clean clothes, brush your teeth. Glasses, contact lens,dentures or partials may not be worn in the OR. If you need to wear them, please bring a case for glasses, do not wear contacts or bring a case, the hospital does not have contact cases, dentures or partials will have to be removed , make sure they are clean, we will provide a denture cup to put them in. You will need some one to drive you home and a responsible person over the age of 27 to stay with you for the first 24 hours after surgery.

## 2021-12-22 ENCOUNTER — Ambulatory Visit (HOSPITAL_COMMUNITY): Payer: BC Managed Care – PPO

## 2021-12-22 ENCOUNTER — Encounter (HOSPITAL_COMMUNITY)
Admission: RE | Disposition: A | Discharge status: Home / Self Care | Payer: Self-pay | Source: Home / Self Care | Attending: Cardiovascular Disease

## 2021-12-22 ENCOUNTER — Other Ambulatory Visit: Payer: Self-pay

## 2021-12-22 ENCOUNTER — Encounter (HOSPITAL_COMMUNITY): Payer: Self-pay | Admitting: General Surgery

## 2021-12-22 ENCOUNTER — Encounter: Payer: Self-pay | Admitting: *Deleted

## 2021-12-22 ENCOUNTER — Observation Stay (HOSPITAL_COMMUNITY)
Admission: RE | Admit: 2021-12-22 | Discharge: 2021-12-23 | Disposition: A | Discharge status: Home / Self Care | Payer: BC Managed Care – PPO | Attending: Cardiovascular Disease | Admitting: Cardiovascular Disease

## 2021-12-22 DIAGNOSIS — Z7901 Long term (current) use of anticoagulants: Secondary | ICD-10-CM | POA: Insufficient documentation

## 2021-12-22 DIAGNOSIS — Z20822 Contact with and (suspected) exposure to covid-19: Secondary | ICD-10-CM | POA: Diagnosis not present

## 2021-12-22 DIAGNOSIS — C250 Malignant neoplasm of head of pancreas: Principal | ICD-10-CM | POA: Insufficient documentation

## 2021-12-22 DIAGNOSIS — Z86718 Personal history of other venous thrombosis and embolism: Secondary | ICD-10-CM | POA: Insufficient documentation

## 2021-12-22 DIAGNOSIS — I4891 Unspecified atrial fibrillation: Secondary | ICD-10-CM | POA: Diagnosis not present

## 2021-12-22 DIAGNOSIS — Z808 Family history of malignant neoplasm of other organs or systems: Secondary | ICD-10-CM | POA: Diagnosis not present

## 2021-12-22 DIAGNOSIS — E039 Hypothyroidism, unspecified: Secondary | ICD-10-CM | POA: Insufficient documentation

## 2021-12-22 DIAGNOSIS — E43 Unspecified severe protein-calorie malnutrition: Secondary | ICD-10-CM | POA: Diagnosis not present

## 2021-12-22 DIAGNOSIS — Z886 Allergy status to analgesic agent status: Secondary | ICD-10-CM | POA: Insufficient documentation

## 2021-12-22 DIAGNOSIS — Z87891 Personal history of nicotine dependence: Secondary | ICD-10-CM | POA: Diagnosis not present

## 2021-12-22 DIAGNOSIS — Z79899 Other long term (current) drug therapy: Secondary | ICD-10-CM | POA: Insufficient documentation

## 2021-12-22 DIAGNOSIS — I480 Paroxysmal atrial fibrillation: Secondary | ICD-10-CM | POA: Diagnosis not present

## 2021-12-22 DIAGNOSIS — Z452 Encounter for adjustment and management of vascular access device: Secondary | ICD-10-CM | POA: Diagnosis not present

## 2021-12-22 DIAGNOSIS — Z95828 Presence of other vascular implants and grafts: Secondary | ICD-10-CM

## 2021-12-22 DIAGNOSIS — F319 Bipolar disorder, unspecified: Secondary | ICD-10-CM | POA: Diagnosis not present

## 2021-12-22 HISTORY — PX: PORTACATH PLACEMENT: SHX2246

## 2021-12-22 LAB — CBC WITH DIFFERENTIAL/PLATELET
Abs Immature Granulocytes: 0.02 10*3/uL (ref 0.00–0.07)
Basophils Absolute: 0 10*3/uL (ref 0.0–0.1)
Basophils Relative: 0 %
Eosinophils Absolute: 0 10*3/uL (ref 0.0–0.5)
Eosinophils Relative: 0 %
HCT: 41.5 % (ref 39.0–52.0)
Hemoglobin: 14 g/dL (ref 13.0–17.0)
Immature Granulocytes: 0 %
Lymphocytes Relative: 17 %
Lymphs Abs: 0.9 10*3/uL (ref 0.7–4.0)
MCH: 34.2 pg — ABNORMAL HIGH (ref 26.0–34.0)
MCHC: 33.7 g/dL (ref 30.0–36.0)
MCV: 101.5 fL — ABNORMAL HIGH (ref 80.0–100.0)
Monocytes Absolute: 0.1 10*3/uL (ref 0.1–1.0)
Monocytes Relative: 1 %
Neutro Abs: 4.3 10*3/uL (ref 1.7–7.7)
Neutrophils Relative %: 82 %
Platelets: 180 10*3/uL (ref 150–400)
RBC: 4.09 MIL/uL — ABNORMAL LOW (ref 4.22–5.81)
RDW: 13.9 % (ref 11.5–15.5)
WBC: 5.3 10*3/uL (ref 4.0–10.5)
nRBC: 0 % (ref 0.0–0.2)

## 2021-12-22 LAB — COMPREHENSIVE METABOLIC PANEL
ALT: 95 U/L — ABNORMAL HIGH (ref 0–44)
AST: 103 U/L — ABNORMAL HIGH (ref 15–41)
Albumin: 3.3 g/dL — ABNORMAL LOW (ref 3.5–5.0)
Alkaline Phosphatase: 262 U/L — ABNORMAL HIGH (ref 38–126)
Anion gap: 6 (ref 5–15)
BUN: 11 mg/dL (ref 6–20)
CO2: 30 mmol/L (ref 22–32)
Calcium: 9.8 mg/dL (ref 8.9–10.3)
Chloride: 104 mmol/L (ref 98–111)
Creatinine, Ser: 0.79 mg/dL (ref 0.61–1.24)
GFR, Estimated: >60 mL/min (ref >60)
Glucose, Bld: 135 mg/dL — ABNORMAL HIGH (ref 70–99)
Potassium: 5 mmol/L (ref 3.5–5.1)
Sodium: 140 mmol/L (ref 135–145)
Total Bilirubin: 0.7 mg/dL (ref 0.3–1.2)
Total Protein: 6.9 g/dL (ref 6.5–8.1)

## 2021-12-22 LAB — SARS CORONAVIRUS 2 BY RT PCR (HOSPITAL ORDER, PERFORMED IN ~~LOC~~ HOSPITAL LAB): SARS Coronavirus 2: NEGATIVE

## 2021-12-22 LAB — PROTIME-INR
INR: 1.1 (ref 0.8–1.2)
Prothrombin Time: 13.9 seconds (ref 11.4–15.2)

## 2021-12-22 LAB — HEMOGLOBIN A1C
Hgb A1c MFr Bld: 4.8 % (ref 4.8–5.6)
Mean Plasma Glucose: 91.06 mg/dL

## 2021-12-22 LAB — TSH: TSH: 1.154 u[IU]/mL (ref 0.350–4.500)

## 2021-12-22 LAB — MAGNESIUM: Magnesium: 1.8 mg/dL (ref 1.7–2.4)

## 2021-12-22 LAB — T4, FREE: Free T4: 0.84 ng/dL (ref 0.61–1.12)

## 2021-12-22 SURGERY — INSERTION, TUNNELED CENTRAL VENOUS DEVICE, WITH PORT
Anesthesia: General | Site: Chest | Laterality: Left

## 2021-12-22 MED ORDER — PROPOFOL 10 MG/ML IV BOLUS
INTRAVENOUS | Status: DC | PRN
Start: 2021-12-22 — End: 2021-12-22
  Administered 2021-12-22: 150 mg via INTRAVENOUS

## 2021-12-22 MED ORDER — FENTANYL CITRATE (PF) 100 MCG/2ML IJ SOLN
INTRAMUSCULAR | Status: DC | PRN
  Administered 2021-12-22: 25 ug via INTRAVENOUS

## 2021-12-22 MED ORDER — AMISULPRIDE (ANTIEMETIC) 5 MG/2ML IV SOLN: 10.0000 mg | Freq: Once | INTRAVENOUS | Status: DC | PRN

## 2021-12-22 MED ORDER — MIDAZOLAM HCL 2 MG/2ML IJ SOLN
INTRAMUSCULAR | Status: DC | PRN
Start: 2021-12-22 — End: 2021-12-22
  Administered 2021-12-22: 2 mg via INTRAVENOUS

## 2021-12-22 MED ORDER — LACTATED RINGERS IV SOLN: INTRAVENOUS | Status: DC

## 2021-12-22 MED ORDER — METOPROLOL TARTRATE 25 MG PO TABS
25.0000 mg | ORAL_TABLET | Freq: Two times a day (BID) | ORAL | Status: DC
Start: 2021-12-22 — End: 2021-12-23
  Administered 2021-12-22 – 2021-12-23 (×2): 25 mg via ORAL
  Filled 2021-12-22 (×2): qty 1

## 2021-12-22 MED ORDER — PHENYLEPHRINE HCL-NACL 20-0.9 MG/250ML-% IV SOLN
INTRAVENOUS | Status: DC | PRN
Start: 2021-12-22 — End: 2021-12-22
  Administered 2021-12-22: 40 ug/min via INTRAVENOUS

## 2021-12-22 MED ORDER — ONDANSETRON HCL 4 MG/2ML IJ SOLN
INTRAMUSCULAR | Status: DC | PRN
  Administered 2021-12-22: 4 mg via INTRAVENOUS

## 2021-12-22 MED ORDER — LIDOCAINE HCL 1 % IJ SOLN
INTRAMUSCULAR | Status: DC | PRN
  Administered 2021-12-22: 16 mL

## 2021-12-22 MED ORDER — MEPERIDINE HCL 25 MG/ML IJ SOLN: 6.2500 mg | INTRAMUSCULAR | Status: DC | PRN

## 2021-12-22 MED ORDER — LIDOCAINE 2% (20 MG/ML) 5 ML SYRINGE
INTRAMUSCULAR | Status: AC
  Filled 2021-12-22: qty 15

## 2021-12-22 MED ORDER — ONDANSETRON HCL 4 MG/2ML IJ SOLN: 4.0000 mg | Freq: Four times a day (QID) | INTRAMUSCULAR | Status: DC | PRN

## 2021-12-22 MED ORDER — ADULT MULTIVITAMIN W/MINERALS CH
1.0000 | ORAL_TABLET | Freq: Every day | ORAL | Status: DC
  Administered 2021-12-23: 1 via ORAL
  Filled 2021-12-22: qty 1

## 2021-12-22 MED ORDER — ONE DAILY MENS 50+ MULTIVIT PO TABS: 1.0000 | ORAL_TABLET | Freq: Every day | ORAL | Status: DC

## 2021-12-22 MED ORDER — DILTIAZEM HCL-DEXTROSE 125-5 MG/125ML-% IV SOLN (PREMIX): 5.0000 mg/h | INTRAVENOUS | Status: DC

## 2021-12-22 MED ORDER — DILTIAZEM LOAD VIA INFUSION: 10.0000 mg | Freq: Once | INTRAVENOUS | Status: DC

## 2021-12-22 MED ORDER — OXYCODONE HCL 5 MG/5ML PO SOLN: 5.0000 mg | Freq: Once | ORAL | Status: DC | PRN

## 2021-12-22 MED ORDER — ONDANSETRON 4 MG PO TBDP
4.0000 mg | ORAL_TABLET | Freq: Four times a day (QID) | ORAL | Status: DC | PRN
  Filled 2021-12-22: qty 1

## 2021-12-22 MED ORDER — DIPHENHYDRAMINE HCL 25 MG PO CAPS: 25.0000 mg | ORAL_CAPSULE | Freq: Four times a day (QID) | ORAL | Status: DC | PRN

## 2021-12-22 MED ORDER — PHENYLEPHRINE 40 MCG/ML (10ML) SYRINGE FOR IV PUSH (FOR BLOOD PRESSURE SUPPORT)
PREFILLED_SYRINGE | INTRAVENOUS | Status: DC | PRN
  Administered 2021-12-22: 40 ug via INTRAVENOUS
  Administered 2021-12-22: 80 ug via INTRAVENOUS
  Administered 2021-12-22: 160 ug via INTRAVENOUS
  Administered 2021-12-22: 40 ug via INTRAVENOUS

## 2021-12-22 MED ORDER — OXYCODONE HCL 5 MG PO TABS: 5.0000 mg | ORAL_TABLET | Freq: Once | ORAL | Status: DC | PRN

## 2021-12-22 MED ORDER — ORAL CARE MOUTH RINSE: 15.0000 mL | Freq: Once | OROMUCOSAL | Status: AC

## 2021-12-22 MED ORDER — LIDOCAINE 2% (20 MG/ML) 5 ML SYRINGE
INTRAMUSCULAR | Status: DC | PRN
Start: 2021-12-22 — End: 2021-12-22
  Administered 2021-12-22: 80 mg via INTRAVENOUS

## 2021-12-22 MED ORDER — HEPARIN SOD (PORK) LOCK FLUSH 100 UNIT/ML IV SOLN
INTRAVENOUS | Status: AC
  Filled 2021-12-22: qty 5

## 2021-12-22 MED ORDER — ACETAMINOPHEN 500 MG PO TABS
1000.0000 mg | ORAL_TABLET | ORAL | Status: AC
  Administered 2021-12-22: 1000 mg via ORAL
  Filled 2021-12-22: qty 2

## 2021-12-22 MED ORDER — ESMOLOL HCL 100 MG/10ML IV SOLN
INTRAVENOUS | Status: AC
  Filled 2021-12-22: qty 10

## 2021-12-22 MED ORDER — METOPROLOL TARTRATE 5 MG/5ML IV SOLN
INTRAVENOUS | Status: AC
  Filled 2021-12-22: qty 5

## 2021-12-22 MED ORDER — PROMETHAZINE HCL 25 MG/ML IJ SOLN: 6.2500 mg | INTRAMUSCULAR | Status: DC | PRN

## 2021-12-22 MED ORDER — DEXAMETHASONE SODIUM PHOSPHATE 10 MG/ML IJ SOLN
INTRAMUSCULAR | Status: AC
  Filled 2021-12-22: qty 1

## 2021-12-22 MED ORDER — ENOXAPARIN SODIUM 40 MG/0.4ML IJ SOSY: 40.0000 mg | PREFILLED_SYRINGE | INTRAMUSCULAR | Status: DC

## 2021-12-22 MED ORDER — PHENYLEPHRINE 40 MCG/ML (10ML) SYRINGE FOR IV PUSH (FOR BLOOD PRESSURE SUPPORT)
PREFILLED_SYRINGE | INTRAVENOUS | Status: AC
  Filled 2021-12-22: qty 50

## 2021-12-22 MED ORDER — FENTANYL CITRATE (PF) 250 MCG/5ML IJ SOLN
INTRAMUSCULAR | Status: AC
  Filled 2021-12-22: qty 5

## 2021-12-22 MED ORDER — ACETAMINOPHEN 325 MG PO TABS: 650.0000 mg | ORAL_TABLET | Freq: Four times a day (QID) | ORAL | Status: DC | PRN

## 2021-12-22 MED ORDER — CHLORHEXIDINE GLUCONATE CLOTH 2 % EX PADS: 6.0000 | MEDICATED_PAD | Freq: Once | CUTANEOUS | Status: DC

## 2021-12-22 MED ORDER — METOPROLOL TARTRATE 5 MG/5ML IV SOLN
INTRAVENOUS | Status: DC | PRN
Start: 2021-12-22 — End: 2021-12-22
  Administered 2021-12-22 (×3): 1 mg via INTRAVENOUS

## 2021-12-22 MED ORDER — EPHEDRINE 5 MG/ML INJ
INTRAVENOUS | Status: AC
  Filled 2021-12-22: qty 10

## 2021-12-22 MED ORDER — CHLORHEXIDINE GLUCONATE 0.12 % MT SOLN
15.0000 mL | Freq: Once | OROMUCOSAL | Status: AC
  Administered 2021-12-22: 15 mL via OROMUCOSAL
  Filled 2021-12-22: qty 15

## 2021-12-22 MED ORDER — BUPIVACAINE-EPINEPHRINE (PF) 0.25% -1:200000 IJ SOLN
INTRAMUSCULAR | Status: AC
  Filled 2021-12-22: qty 30

## 2021-12-22 MED ORDER — LACTATED RINGERS IV SOLN: INTRAVENOUS | Status: DC | PRN

## 2021-12-22 MED ORDER — ROCURONIUM BROMIDE 10 MG/ML (PF) SYRINGE
PREFILLED_SYRINGE | INTRAVENOUS | Status: AC
  Filled 2021-12-22: qty 10

## 2021-12-22 MED ORDER — MIDAZOLAM HCL 2 MG/2ML IJ SOLN
INTRAMUSCULAR | Status: AC
  Filled 2021-12-22: qty 2

## 2021-12-22 MED ORDER — HEPARIN SOD (PORK) LOCK FLUSH 100 UNIT/ML IV SOLN
INTRAVENOUS | Status: DC | PRN
  Administered 2021-12-22: 500 [IU] via INTRAVENOUS

## 2021-12-22 MED ORDER — DIVALPROEX SODIUM ER 500 MG PO TB24
2000.0000 mg | ORAL_TABLET | Freq: Every day | ORAL | Status: DC
  Administered 2021-12-22: 2000 mg via ORAL
  Filled 2021-12-22: qty 4

## 2021-12-22 MED ORDER — SODIUM CHLORIDE 0.9 % IV SOLN: INTRAVENOUS | Status: DC

## 2021-12-22 MED ORDER — 0.9 % SODIUM CHLORIDE (POUR BTL) OPTIME
TOPICAL | Status: DC | PRN
  Administered 2021-12-22: 1000 mL

## 2021-12-22 MED ORDER — HYDROMORPHONE HCL 1 MG/ML IJ SOLN: 0.2500 mg | INTRAMUSCULAR | Status: DC | PRN

## 2021-12-22 MED ORDER — DIPHENHYDRAMINE HCL 50 MG/ML IJ SOLN: 25.0000 mg | Freq: Four times a day (QID) | INTRAMUSCULAR | Status: DC | PRN

## 2021-12-22 MED ORDER — ONDANSETRON HCL 4 MG/2ML IJ SOLN
INTRAMUSCULAR | Status: AC
  Filled 2021-12-22: qty 6

## 2021-12-22 MED ORDER — PANTOPRAZOLE SODIUM 40 MG PO TBEC
40.0000 mg | DELAYED_RELEASE_TABLET | Freq: Every day | ORAL | Status: DC
  Administered 2021-12-23: 40 mg via ORAL
  Filled 2021-12-22: qty 1

## 2021-12-22 MED ORDER — ESMOLOL BOLUS VIA INFUSION
500.0000 ug | Freq: Once | INTRAVENOUS | Status: DC
  Filled 2021-12-22: qty 1000

## 2021-12-22 MED ORDER — HEPARIN 6000 UNIT IRRIGATION SOLUTION
Status: DC | PRN
  Administered 2021-12-22: 1

## 2021-12-22 MED ORDER — CEFAZOLIN SODIUM-DEXTROSE 2-4 GM/100ML-% IV SOLN
2.0000 g | INTRAVENOUS | Status: AC
  Administered 2021-12-22: 2 g via INTRAVENOUS
  Filled 2021-12-22: qty 100

## 2021-12-22 MED ORDER — SUCCINYLCHOLINE CHLORIDE 200 MG/10ML IV SOSY
PREFILLED_SYRINGE | INTRAVENOUS | Status: AC
  Filled 2021-12-22: qty 10

## 2021-12-22 MED ORDER — METOPROLOL TARTRATE 5 MG/5ML IV SOLN
2.0000 mg | Freq: Once | INTRAVENOUS | Status: AC
  Administered 2021-12-22: 2 mg via INTRAVENOUS

## 2021-12-22 MED ORDER — ACETAMINOPHEN 325 MG PO TABS: 650.0000 mg | ORAL_TABLET | ORAL | Status: DC | PRN

## 2021-12-22 MED ORDER — LEVOTHYROXINE SODIUM 137 MCG PO TABS
137.0000 ug | ORAL_TABLET | Freq: Every day | ORAL | Status: DC
  Administered 2021-12-23: 137 ug via ORAL
  Filled 2021-12-22: qty 1

## 2021-12-22 MED ORDER — LIDOCAINE HCL (PF) 1 % IJ SOLN
INTRAMUSCULAR | Status: AC
  Filled 2021-12-22: qty 30

## 2021-12-22 MED ORDER — DEXAMETHASONE SODIUM PHOSPHATE 10 MG/ML IJ SOLN
INTRAMUSCULAR | Status: DC | PRN
  Administered 2021-12-22: 10 mg via INTRAVENOUS

## 2021-12-22 MED ORDER — HEPARIN 6000 UNIT IRRIGATION SOLUTION
Status: AC
  Filled 2021-12-22: qty 500

## 2021-12-22 SURGICAL SUPPLY — 41 items
ADH SKN CLS APL DERMABOND .7 (GAUZE/BANDAGES/DRESSINGS) ×1
BAG COUNTER SPONGE SURGICOUNT (BAG) ×2 IMPLANT
BAG DECANTER FOR FLEXI CONT (MISCELLANEOUS) ×2 IMPLANT
CANISTER SUCT 3000ML PPV (MISCELLANEOUS) ×1 IMPLANT
CHLORAPREP W/TINT 26 (MISCELLANEOUS) ×2 IMPLANT
COVER SURGICAL LIGHT HANDLE (MISCELLANEOUS) ×2 IMPLANT
COVER TRANSDUCER ULTRASND GEL (DISPOSABLE) IMPLANT
DECANTER SPIKE VIAL GLASS SM (MISCELLANEOUS) ×3 IMPLANT
DERMABOND ADVANCED (GAUZE/BANDAGES/DRESSINGS) ×1
DERMABOND ADVANCED .7 DNX12 (GAUZE/BANDAGES/DRESSINGS) ×1 IMPLANT
DRAPE C-ARM 42X120 X-RAY (DRAPES) ×2 IMPLANT
DRAPE CHEST BREAST 15X10 FENES (DRAPES) ×1 IMPLANT
DRAPE WARM FLUID 44X44 (DRAPES) IMPLANT
ELECT COATED BLADE 2.86 ST (ELECTRODE) ×2 IMPLANT
ELECT REM PT RETURN 9FT ADLT (ELECTROSURGICAL) ×2
ELECTRODE REM PT RTRN 9FT ADLT (ELECTROSURGICAL) ×1 IMPLANT
GAUZE 4X4 16PLY ~~LOC~~+RFID DBL (SPONGE) ×2 IMPLANT
GEL ULTRASOUND 20GR AQUASONIC (MISCELLANEOUS) IMPLANT
GLOVE SURG ENC MOIS LTX SZ6 (GLOVE) ×2 IMPLANT
GLOVE SURG UNDER LTX SZ6.5 (GLOVE) ×1 IMPLANT
GOWN STRL REUS W/ TWL LRG LVL3 (GOWN DISPOSABLE) ×1 IMPLANT
GOWN STRL REUS W/TWL 2XL LVL3 (GOWN DISPOSABLE) ×2 IMPLANT
GOWN STRL REUS W/TWL LRG LVL3 (GOWN DISPOSABLE) ×2
KIT BASIN OR (CUSTOM PROCEDURE TRAY) ×2 IMPLANT
KIT PORT POWER 8FR ISP CVUE (Port) ×1 IMPLANT
KIT TURNOVER KIT B (KITS) ×2 IMPLANT
NEEDLE 22X1 1/2 (OR ONLY) (NEEDLE) ×2 IMPLANT
NS IRRIG 1000ML POUR BTL (IV SOLUTION) ×2 IMPLANT
PAD ARMBOARD 7.5X6 YLW CONV (MISCELLANEOUS) ×2 IMPLANT
PENCIL BUTTON HOLSTER BLD 10FT (ELECTRODE) ×2 IMPLANT
POSITIONER HEAD DONUT 9IN (MISCELLANEOUS) ×2 IMPLANT
SUT MON AB 4-0 PC3 18 (SUTURE) ×2 IMPLANT
SUT PROLENE 2 0 SH DA (SUTURE) ×4 IMPLANT
SUT VIC AB 3-0 SH 27 (SUTURE) ×2
SUT VIC AB 3-0 SH 27X BRD (SUTURE) ×1 IMPLANT
SYR 5ML LUER SLIP (SYRINGE) ×2 IMPLANT
TOWEL GREEN STERILE (TOWEL DISPOSABLE) ×2 IMPLANT
TOWEL GREEN STERILE FF (TOWEL DISPOSABLE) ×2 IMPLANT
TRAY LAPAROSCOPIC MC (CUSTOM PROCEDURE TRAY) ×2 IMPLANT
TUBE CONNECTING 12X1/4 (SUCTIONS) IMPLANT
YANKAUER SUCT BULB TIP NO VENT (SUCTIONS) IMPLANT

## 2021-12-22 NOTE — Op Note (Signed)
PREOPERATIVE DIAGNOSIS:  adenocarcinoma pancreatic head, ypT1cN1, s/p whipple     POSTOPERATIVE DIAGNOSIS:  Same     PROCEDURE: Left subclavian port placement, Bard ClearVue Power Port, MRI safe, 8-French.      SURGEON:  Stark Klein, MD      ANESTHESIA:  General   FINDINGS:  Good venous return, easy flush, and tip of the catheter and SVC 24.5 cm.      SPECIMEN:  None.      ESTIMATED BLOOD LOSS:  Minimal.      COMPLICATIONS:  None known.      PROCEDURE:  Pt was identified in the holding area and taken to   the operating room, where patient was placed supine on the operating room   table.  General anesthesia was induced.  Patient's arms were tucked and the upper   chest and neck were prepped and draped in sterile fashion.  Time-out was   performed according to the surgical safety check list.  When all was   correct, we continued.   Local anesthetic was administered over this   area at the angle of the clavicle.  The vein was accessed with 1 pass(es) of the needle. There was good venous return and the wire passed easily with no ectopy.   Fluoroscopy was used to confirm that the wire was in the vena cava.      The patient was placed back level and the area for the pocket was anethetized   with local anesthetic.  A 3-cm transverse incision was made with a #15   blade.  Cautery was used to divide the subcutaneous tissues down to the   pectoralis muscle.  An Army-Nav[employer] retractor was used to elevate the skin   while a pocket was created on top of the pectoralis fascia.  The port   was placed into the pocket to confirm that it was of adequate size.  The   catheter was preattached to the port.  The port was then secured to the   pectoralis fascia with four 2-0 Prolene sutures.  These were clamped and   not tied down yet.    The catheter was tunneled through to the wire exit   site.  The catheter was placed along the wire to determine what length it should be to be in the SVC.  The  catheter was cut at 24.5 cm.  The tunneler sheath and dilator were passed over the wire and the dilator and wire were removed.  The catheter was advanced through the tunneler sheath and the tunneler sheath was pulled away.  Care was taken to keep the catheter in the tunneler sheath as this occurred. This was advanced and the tunneler sheath was removed.  There was good venous   return and easy flush of the catheter.  The Prolene sutures were tied   down to the pectoral fascia.  The skin was reapproximated using 3-0   Vicryl interrupted deep dermal sutures.    Fluoroscopy was used to re-confirm good position of the catheter.  The skin   was then closed using 4-0 Monocryl in a subcuticular fashion.  The port was flushed with concentrated heparin flush as well.  The wounds were then cleaned, dried, and dressed with Dermabond.  The patient was awakened from anesthesia and taken to the PACU in stable condition.  Needle, sponge, and instrument counts were correct.               Stark Klein, MD

## 2021-12-22 NOTE — Interval H&P Note (Signed)
History and Physical Interval Note:  12/22/2021 2:09 PM  Dean [name]  has presented today for surgery, with the diagnosis of pancreatic cancer.  The various methods of treatment have been discussed with the patient and family. After consideration of risks, benefits and other options for treatment, the patient has consented to  Procedure(s): INSERTION PORT-A-CATH (N/A) as a surgical intervention.  The patient's history has been reviewed, patient examined, no change in status, stable for surgery.  I have reviewed the patient's chart and labs.  Questions were answered to the patient's satisfaction.     Stark Klein

## 2021-12-22 NOTE — Transfer of Care (Signed)
Immediate Anesthesia Transfer of Care Note  Patient: Dean [name]  Procedure(s) Performed: INSERTION PORT-A-CATH (Left: Chest)  Patient Location: PACU  Anesthesia Type:General  Level of Consciousness: awake, alert  and oriented  Airway & Oxygen Therapy: Patient Spontanous Breathing  Post-op Assessment: Report given to RN, Post -op Vital signs reviewed and stable and Patient moving all extremities X 4  Post vital signs: Reviewed and stable  Last Vitals:  Vitals Value Taken Time  BP 128/89 12/22/21 1536  Temp    Pulse 103 12/22/21 1538  Resp 14 12/22/21 1538  SpO2 99 % 12/22/21 1538  Vitals shown include unvalidated device data.  Last Pain:  Vitals:   12/22/21 1311  TempSrc:   PainSc: 0-No pain         Complications: No notable events documented.

## 2021-12-22 NOTE — H&P (Addendum)
Cardiology Admission History and Physical:   Patient ID: Dean [name] MRN: [id number]; DOB: January 13, 1966   Admission date: 12/22/2021  PCP:  Scifres, Earlie Server, PA-C   [facility] Providers Cardiologist:  None        Chief Complaint:  atrial fib   Patient Profile:   TAVARIOUS [name] is a 56 y.o. male with Bipolar disease, GERD, thyroid disease,  primary cancer of the pancreas, pancreatitis, with diagnostic lap and whipple procedure 11/12/21 here for port a cath procedure who is being seen 12/22/2021 for the evaluation of atrial fib.  History of Present Illness:   Mr. Langdon with above history including DVT on 11/2021 on eliquis.  The eliquis is on hold for the Portacath  line and was to resume on the 22nd.   Today with procedure he developed atrial fib but rate controlled and plan was to discharge and be seen in a fib clinic tomorrow.  But pt ready to go home and HR up 120-130s.   We have been asked to admit to obs for monitoring.    He has rec' d lopressor 2 mg IV once and esmolol but rate continues to be elevated.  No awareness of atrial fib.   Labs from the 16th with Mg+ 1.8,  Na 138 k+ 4.7   Cr 0.69 CBC Hgb 12.9 WBC 4.2 plts 171 BP 106/85   EKG:  The ECG that was done not yet done    Past Medical History:  Diagnosis Date   Bipolar 1 disorder ([place])    DVT (deep venous thrombosis) ([place]) 11/2021   left leg   Family history of breast cancer 12/16/2021   GERD (gastroesophageal reflux disease)    Hypothyroidism    Pancreas cancer ([place]) 11/12/2021   Thyroid disease     Past Surgical History:  Procedure Laterality Date   BACK SURGERY     BILIARY BRUSHING  10/04/2021   Procedure: BILIARY BRUSHING;  Surgeon: Clarene Essex, MD;  Location: [facility];  Service: Endoscopy;;   BILIARY STENT PLACEMENT N/A 10/04/2021   Procedure: BILIARY STENT PLACEMENT;  Surgeon: Clarene Essex, MD;  Location: [facility];  Service: Endoscopy;  Laterality: N/A;   ERCP Bilateral  10/04/2021   Procedure: ENDOSCOPIC RETROGRADE CHOLANGIOPANCREATOGRAPHY (ERCP);  Surgeon: Clarene Essex, MD;  Location: [facility];  Service: Endoscopy;  Laterality: Bilateral;   ESOPHAGOGASTRODUODENOSCOPY (EGD) WITH PROPOFOL N/A 10/04/2021   Procedure: ESOPHAGOGASTRODUODENOSCOPY (EGD) WITH PROPOFOL;  Surgeon: Clarene Essex, MD;  Location: [facility];  Service: Endoscopy;  Laterality: N/A;   FINE NEEDLE ASPIRATION  10/04/2021   Procedure: FINE NEEDLE ASPIRATION (FNA) LINEAR;  Surgeon: Clarene Essex, MD;  Location: [facility];  Service: Endoscopy;;   FOREIGN BODY REMOVAL  10/04/2021   Procedure: FOREIGN BODY REMOVAL;  Surgeon: Clarene Essex, MD;  Location: [facility];  Service: Endoscopy;;   KNEE SURGERY     LAPAROSCOPY N/A 11/12/2021   Procedure: DIAGNOSTIC LAPAROSCOPY;  Surgeon: Stark Klein, MD;  Location: [place];  Service: General;  Laterality: N/A;   NOSE SURGERY     PANCREATIC STENT PLACEMENT  10/04/2021   Procedure: PANCREATIC STENT PLACEMENT;  Surgeon: Clarene Essex, MD;  Location: [facility];  Service: Endoscopy;;   ROTATOR CUFF REPAIR     SPHINCTEROTOMY  10/04/2021   Procedure: Joan Mayans;  Surgeon: Clarene Essex, MD;  Location: [facility];  Service: Endoscopy;;   STENT REMOVAL  10/04/2021   Procedure: STENT REMOVAL;  Surgeon: Clarene Essex, MD;  Location: [facility];  Service: Endoscopy;;   UPPER ESOPHAGEAL ENDOSCOPIC  ULTRASOUND (EUS)  10/04/2021   Procedure: UPPER ESOPHAGEAL ENDOSCOPIC ULTRASOUND (EUS);  Surgeon: Clarene Essex, MD;  Location: [facility];  Service: Endoscopy;;   WHIPPLE PROCEDURE N/A 11/12/2021   Procedure: WHIPPLE PROCEDURE;  Surgeon: Stark Klein, MD;  Location: [place];  Service: General;  Laterality: N/A;     Medications Prior to Admission: Prior to Admission medications   Medication Sig Start Date End Date Taking? Authorizing Provider  apixaban (ELIQUIS) 5 MG TABS tablet Take 1 tablet (5 mg total) by mouth 2 (two) times daily. Start after starter pack  completed 12/26/21  Yes Winferd Humphrey, PA-C  divalproex (DEPAKOTE ER) 500 MG 24 hr tablet TAKE 5 TABLETS (2,500 MG TOTAL) BY MOUTH DAILY. Patient taking differently: Take 2,000 mg by mouth at bedtime. 03/16/21  Yes Cottle, Billey Co., MD  levothyroxine (SYNTHROID) 137 MCG tablet Take 137 mcg by mouth daily before breakfast. 01/08/20  Yes [provider]  Multiple Vitamins-Minerals (ONE DAILY MENS 50+ MULTIVIT) TABS Take 1 tablet by mouth daily.   Yes [provider]  pantoprazole (PROTONIX) 40 MG tablet Take 40 mg by mouth daily.   Yes [provider]  ALPRAZolam (XANAX) 0.5 MG tablet TAKE 1 TABLET BY MOUTH AT BEDTIME AS NEEDED FOR ANXIETY. 12/20/21   Cottle, Billey Co., MD  lidocaine-prilocaine (EMLA) cream Apply 1 application topically as needed. Apply 1/2 tablespoon to port site and cover with Press-and-Seal 2 hours prior to access to numb site. Do not start until 14 days after port is inserted. 12/20/21   Ladell Pier, MD  ondansetron (ZOFRAN) 8 MG tablet Take 1 tablet (8 mg total) by mouth every 8 (eight) hours as needed for nausea or vomiting. Start taking 72 hours after IV chemotherapy treatment given 12/20/21   Ladell Pier, MD  prochlorperazine (COMPAZINE) 10 MG tablet Take 1 tablet (10 mg total) by mouth every 6 (six) hours as needed for nausea. 12/20/21   Ladell Pier, MD     Allergies:    Allergies  Allergen Reactions   Erythromycin Nausea Only   Ibuprofen Other (See Comments)    Peptic Ulcer nsaids    Social History:   Social History   Socioeconomic History   Marital status: Married    Spouse name: Not on file   Number of children: Not on file   Years of education: Not on file   Highest education level: Not on file  Occupational History   Not on file  Tobacco Use   Smoking status: Former    Types: Cigarettes    Quit date: 2012    Years since quitting: 11.0   Smokeless tobacco: Never  Vaping Use   Vaping Use: Never used   Substance and Sexual Activity   Alcohol use: Not Currently   Drug use: No   Sexual activity: Not on file  Other Topics Concern   Not on file  Social History Narrative   Not on file   Social Determinants of Health   Financial Resource Strain: Not on file  Food Insecurity: Not on file  Transportation Needs: Not on file  Physical Activity: Not on file  Stress: Not on file  Social Connections: Not on file  Intimate Partner Violence: Not on file    Family History:   The patient's family history includes Breast cancer in his paternal aunt; Cancer in his maternal uncle and paternal aunt; Lung cancer in his maternal grandfather and maternal grandmother; Thyroid cancer in his father.  ROS:  Please see the history of present illness.  General:no colds or fevers, no weight changes Skin:no rashes or ulcers HEENT:no blurred vision, no congestion CV:see HPI PUL:see HPI GI:no diarrhea constipation or melena, no indigestion GU:no hematuria, no dysuria MS:no joint pain, no claudication, + DVT Neuro:no syncope, no lightheadedness Endo:no diabetes, no thyroid disease All other ROS reviewed and negative.     Physical Exam/Data:   Vitals:   12/22/21 1707 12/22/21 1715 12/22/21 1726 12/22/21 1736  BP:  (!) 106/94 106/85 116/87  Pulse: (!) 132 (!) 141 (!) 154 (!) 138  Resp: 16 12 16  (!) 21  Temp:      TempSrc:      SpO2: 98% 100% 99% 97%  Weight:      Height:        Intake/Output Summary (Last 24 hours) at 12/22/2021 1741 Last data filed at 12/22/2021 1731 Gross per 24 hour  Intake 100 ml  Output 1440 ml  Net -1340 ml   Last 3 Weights 12/22/2021 12/16/2021 12/10/2021  Weight (lbs) 156 lb 162 lb 6 oz 163 lb 3.2 oz  Weight (kg) 70.761 kg 73.653 kg 74.027 kg     Body mass index is 21.76 kg/m.  Exam by Dr. Gwenlyn Found      Relevant CV Studies: none  Laboratory Data:  High Sensitivity Troponin:  No results for input(s): TROPONINIHS in the last 720 hours.    Chemistry Recent Labs   Lab 12/20/21 1340  NA 138  K 4.7  CL 104  CO2 29  GLUCOSE 84  BUN 15  CREATININE 0.69  CALCIUM 9.9  MG 1.8  GFRNONAA >60  ANIONGAP 5    Recent Labs  Lab 12/20/21 1340  PROT 7.2  ALBUMIN 4.0  AST 42*  ALT 52*  ALKPHOS 194*  BILITOT 0.6   Lipids No results for input(s): CHOL, TRIG, HDL, LABVLDL, LDLCALC, CHOLHDL in the last 168 hours. Hematology Recent Labs  Lab 12/20/21 1340  WBC 4.2  RBC 3.84*  HGB 12.9*  HCT 39.0  MCV 101.6*  MCH 33.6  MCHC 33.1  RDW 14.0  PLT 171   Thyroid No results for input(s): TSH, FREET4 in the last 168 hours. BNPNo results for input(s): BNP, PROBNP in the last 168 hours.  DDimer No results for input(s): DDIMER in the last 168 hours.   Radiology/Studies:  DG CHEST PORT 1 VIEW  Result Date: 12/22/2021 CLINICAL DATA:  Interval placement of Port-A-Cath EXAM: PORTABLE CHEST 1 VIEW COMPARISON:  Chest x-ray dated October 04, 2021 FINDINGS: Interval placement of left chest wall Port-A-Cath with tip overlying the expected area of the lower SVC. Cardiac and mediastinal contours are within normal limits. Lungs are clear. No pleural effusion or pneumothorax. IMPRESSION: Interval placement of left chest wall Port-A-Cath with tip overlying the expected area of the lower SVC. Electronically Signed   By: Yetta Glassman M.D.   On: 12/22/2021 16:36   DG C-Arm 1-60 Min-No Report  Result Date: 12/22/2021 Fluoroscopy was utilized by the requesting physician.  No radiographic interpretation.     Assessment and Plan:   Atrial fib with RVR, has not responded to BB or esmolol. Will add dilt IV , pt unaware of atrial fib.  No anticoag until the 22nd , keep NPO for possible TEE DCCV though will need to check with surgery if we can add anticoag tomorrow. Portocath placement.  Pancreatic cancer per surgery   Risk Assessment/Risk Scores:         CHA2DS2-VASc Score =  0   This indicates a 0.2% annual risk of stroke. The patient's score is based  upon: CHF History: 0 HTN History: 0 Diabetes History: 0 Stroke History: 0 Vascular Disease History: 0 Age Score: 0 Gender Score: 0       Severity of Illness: The appropriate patient status for this patient is OBSERVATION. Observation status is judged to be reasonable and necessary in order to provide the required intensity of service to ensure the patient's safety. The patient's presenting symptoms, physical exam findings, and initial radiographic and laboratory data in the context of their medical condition is felt to place them at decreased risk for further clinical deterioration. Furthermore, it is anticipated that the patient will be medically stable for discharge from the hospital within 2 midnights of admission.    For questions or updates, please contact [place] Please consult [email or web address] for contact info under     Signed, Cecilie Kicks, NP  12/22/2021 5:41 PM &   Agree with note written by Cecilie Kicks RNP  We are asked to see Mr.Choinski for new onset A. fib that occurred after placement of a Port-A-Cath today in the PACU.  He has no prior cardiac history nor does he have risk factors.  He had pancreatic cancer status post Whipple procedure in December.  He was discharged home on Eliquis because of a DVT.  He stopped his Eliquis several days before this procedure and postoperatively was noticed to be in A. fib with RVR.  He was treated with esmolol without effect.  His heart rate is in the 120-130 range although he is unaware that he is in A. fib.  His exam is benign.  He apparently already has an appointment in the [facility] clinic tomorrow.  We will treat him with IV diltiazem and hopefully he will convert by the morning.  Otherwise, we will keep him n.p.o. in anticipation of potential TEE guided cardioversion tomorrow.   Quay Burow 12/22/2021 5:50 PM

## 2021-12-22 NOTE — Progress Notes (Signed)
° °  Pt placed in tele bed but he has converted to SR.  No IV dilt was started.  I will add lopressor po BID and will follow overnight. If maintains SR  discharge in AM and perhaps have echo done as outpt.   No need for IV amiodarone.  Labs not yet done.    Cecilie Kicks, FNP-C [facility]  [phone] or after 5pm and on weekends call [phone] 12/22/2021.now

## 2021-12-22 NOTE — Progress Notes (Signed)
Patient arrived to unit with LR infusing. Patient is in NSR, VSS. Will continue to monitor.

## 2021-12-22 NOTE — Anesthesia Procedure Notes (Signed)
Procedure Name: LMA Insertion Date/Time: 12/22/2021 2:44 PM Performed by: Harden Mo, CRNA Pre-anesthesia Checklist: Patient identified, Emergency Drugs available, Suction available and Patient being monitored Patient Re-evaluated:Patient Re-evaluated prior to induction Oxygen Delivery Method: Circle System Utilized Preoxygenation: Pre-oxygenation with 100% oxygen Induction Type: IV induction Ventilation: Mask ventilation without difficulty LMA: LMA inserted LMA Size: 5.0 Number of attempts: 1 Airway Equipment and Method: Bite block Placement Confirmation: positive ETCO2 and breath sounds checked- equal and bilateral Tube secured with: Tape Dental Injury: Teeth and Oropharynx as per pre-operative assessment

## 2021-12-22 NOTE — Anesthesia Preprocedure Evaluation (Signed)
Anesthesia Evaluation  Patient identified by MRN, date of birth, ID band Patient awake    Reviewed: Allergy & Precautions, NPO status , Patient's Chart, lab work & pertinent test results  Airway Mallampati: II  TM Distance: >3 FB Neck ROM: Full    Dental no notable dental hx.    Pulmonary former smoker,    Pulmonary exam normal breath sounds clear to auscultation       Cardiovascular Exercise Tolerance: Good negative cardio ROS Normal cardiovascular exam Rhythm:Regular Rate:Normal     Neuro/Psych PSYCHIATRIC DISORDERS Bipolar Disorder    GI/Hepatic Neg liver ROS, GERD  ,Bile duct mass   Endo/Other  Hypothyroidism   Renal/GU negative Renal ROS  negative genitourinary   Musculoskeletal negative musculoskeletal ROS (+)   Abdominal   Peds negative pediatric ROS (+)  Hematology negative hematology ROS (+)   Anesthesia Other Findings Pancreatic Cancer  Reproductive/Obstetrics negative OB ROS                             Anesthesia Physical  Anesthesia Plan  ASA: 3  Anesthesia Plan: General   Post-op Pain Management:    Induction: Intravenous  PONV Risk Score and Plan: 2 and Treatment may vary due to age or medical condition, Ondansetron and Midazolam  Airway Management Planned: LMA  Additional Equipment:   Intra-op Plan:   Post-operative Plan: Extubation in OR  Informed Consent: I have reviewed the patients History and Physical, chart, labs and discussed the procedure including the risks, benefits and alternatives for the proposed anesthesia with the patient or authorized representative who has indicated his/her understanding and acceptance.       Plan Discussed with: CRNA, Anesthesiologist and Surgeon  Anesthesia Plan Comments: (  )        Anesthesia Quick Evaluation

## 2021-12-22 NOTE — Discharge Instructions (Signed)
[place] Office Phone Number [phone]   POST OP INSTRUCTIONS  Always review your discharge instruction sheet given to you by the facility where your surgery was performed.  IF YOU HAVE DISABILITY OR FAMILY LEAVE FORMS, YOU MUST BRING THEM TO THE OFFICE FOR PROCESSING.  DO NOT GIVE THEM TO YOUR DOCTOR.  A prescription for pain medication may be given to you upon discharge.  Take your pain medication as prescribed, if needed.  If narcotic pain medicine is not needed, then you may take acetaminophen (Tylenol) or ibuprofen (Advil) as needed. Take your usually prescribed medications unless otherwise directed If you need a refill on your pain medication, please contact your pharmacy.  They will contact our office to request authorization.  Prescriptions will not be filled after 5pm or on week-ends. You should eat very light the first 24 hours after surgery, such as soup, crackers, pudding, etc.  Resume your normal diet the day after surgery It is common to experience some constipation if taking pain medication after surgery.  Increasing fluid intake and taking a stool softener will usually help or prevent this problem from occurring.  A mild laxative (Milk of Magnesia or Miralax) should be taken according to package directions if there are no bowel movements after 48 hours. You may shower in 48 hours.  The surgical glue will flake off in 2-3 weeks.   ACTIVITIES:  No strenuous activity or heavy lifting for 1 week.   You may drive when you no longer are taking prescription pain medication, you can comfortably wear a seatbelt, and you can safely maneuver your car and apply brakes. RETURN TO WORK:  __________to be determined_______________ Dennis Bast should see your doctor in the office for a follow-up appointment approximately three-four weeks after your surgery.    WHEN TO CALL YOUR DOCTOR: Fever over 101.0 Nausea and/or vomiting. Extreme swelling or bruising. Continued bleeding from  incision. Increased pain, redness, or drainage from the incision.  The clinic staff is available to answer your questions during regular business hours.  Please dont hesitate to call and ask to speak to one of the nurses for clinical concerns.  If you have a medical emergency, go to the nearest emergency room or call 911.  A surgeon from [facility] is always on call at the hospital.  For further questions, please visit [email or web address]

## 2021-12-22 NOTE — Progress Notes (Signed)
Anesthesiology:   Mr. Leger remains in Afib, s/p Port-a-Cath placement, now with RVR at 130-140. He has not slowed or converted with B-blockade. BP has remained stable, and pt is asymptomatic.   Cardiology has consulted, recommending treatment with Diltiazem and Amiodarone. Dr. Barry Dienes is aware and will admit pt to monitored bed. Mr. Pullara and his wife have been informed, all questions answered.     Appreciated Cardiology (Dr. Gwenlyn Found) input.   Jenita Seashore, MD

## 2021-12-23 ENCOUNTER — Ambulatory Visit (HOSPITAL_COMMUNITY): Payer: BC Managed Care – PPO | Admitting: Physician Assistant

## 2021-12-23 ENCOUNTER — Observation Stay (HOSPITAL_COMMUNITY): Payer: BC Managed Care – PPO

## 2021-12-23 ENCOUNTER — Encounter (HOSPITAL_COMMUNITY): Payer: Self-pay | Admitting: General Surgery

## 2021-12-23 DIAGNOSIS — Z7901 Long term (current) use of anticoagulants: Secondary | ICD-10-CM | POA: Diagnosis not present

## 2021-12-23 DIAGNOSIS — I480 Paroxysmal atrial fibrillation: Secondary | ICD-10-CM | POA: Diagnosis not present

## 2021-12-23 DIAGNOSIS — Z886 Allergy status to analgesic agent status: Secondary | ICD-10-CM | POA: Diagnosis not present

## 2021-12-23 DIAGNOSIS — Z808 Family history of malignant neoplasm of other organs or systems: Secondary | ICD-10-CM | POA: Diagnosis not present

## 2021-12-23 DIAGNOSIS — I4891 Unspecified atrial fibrillation: Secondary | ICD-10-CM | POA: Diagnosis not present

## 2021-12-23 DIAGNOSIS — Z86718 Personal history of other venous thrombosis and embolism: Secondary | ICD-10-CM | POA: Diagnosis not present

## 2021-12-23 DIAGNOSIS — Z20822 Contact with and (suspected) exposure to covid-19: Secondary | ICD-10-CM | POA: Diagnosis not present

## 2021-12-23 DIAGNOSIS — C250 Malignant neoplasm of head of pancreas: Secondary | ICD-10-CM | POA: Diagnosis not present

## 2021-12-23 DIAGNOSIS — E039 Hypothyroidism, unspecified: Secondary | ICD-10-CM | POA: Diagnosis not present

## 2021-12-23 DIAGNOSIS — E43 Unspecified severe protein-calorie malnutrition: Secondary | ICD-10-CM | POA: Diagnosis not present

## 2021-12-23 DIAGNOSIS — Z79899 Other long term (current) drug therapy: Secondary | ICD-10-CM | POA: Diagnosis not present

## 2021-12-23 DIAGNOSIS — Z87891 Personal history of nicotine dependence: Secondary | ICD-10-CM | POA: Diagnosis not present

## 2021-12-23 LAB — CBC
HCT: 39.2 % (ref 39.0–52.0)
Hemoglobin: 13 g/dL (ref 13.0–17.0)
MCH: 33.6 pg (ref 26.0–34.0)
MCHC: 33.2 g/dL (ref 30.0–36.0)
MCV: 101.3 fL — ABNORMAL HIGH (ref 80.0–100.0)
Platelets: 175 10*3/uL (ref 150–400)
RBC: 3.87 MIL/uL — ABNORMAL LOW (ref 4.22–5.81)
RDW: 13.9 % (ref 11.5–15.5)
WBC: 6.3 10*3/uL (ref 4.0–10.5)
nRBC: 0 % (ref 0.0–0.2)

## 2021-12-23 LAB — BASIC METABOLIC PANEL
Anion gap: 8 (ref 5–15)
BUN: 13 mg/dL (ref 6–20)
CO2: 29 mmol/L (ref 22–32)
Calcium: 9.5 mg/dL (ref 8.9–10.3)
Chloride: 103 mmol/L (ref 98–111)
Creatinine, Ser: 0.91 mg/dL (ref 0.61–1.24)
GFR, Estimated: >60 mL/min (ref >60)
Glucose, Bld: 118 mg/dL — ABNORMAL HIGH (ref 70–99)
Potassium: 4.9 mmol/L (ref 3.5–5.1)
Sodium: 140 mmol/L (ref 135–145)

## 2021-12-23 LAB — LIPID PANEL
Cholesterol: 134 mg/dL (ref 0–200)
HDL: 61 mg/dL (ref >40)
LDL Cholesterol: 63 mg/dL (ref 0–99)
Total CHOL/HDL Ratio: 2.2 RATIO
Triglycerides: 49 mg/dL (ref <150)
VLDL: 10 mg/dL (ref 0–40)

## 2021-12-23 MED ORDER — METOPROLOL TARTRATE 25 MG PO TABS: 12.5000 mg | ORAL_TABLET | Freq: Two times a day (BID) | ORAL | 0 refills | Status: DC

## 2021-12-23 NOTE — Discharge Summary (Addendum)
Discharge Summary    Patient ID: Dean [name] MRN: [id number]; DOB: 1966-07-29  Admit date: 12/22/2021 Discharge date: 12/23/2021  PCP:  Maude Leriche, PA-C   [facility] Cardiologist:  Quay Burow, MD      Discharge Diagnoses    Principal Problem:   Atrial fibrillation [facility]) Active Problems:   Atrial fibrillation with RVR ([place])  Diagnostic Studies/Procedures    N/a  _____________   History of Present Illness     Dean [name] is a 56 y.o. male with  Bipolar disease, GERD, thyroid disease,  primary cancer of the pancreas, pancreatitis, with diagnostic lap and whipple procedure 11/12/21 who presented for a port a cath procedure who was seen 12/22/2021 for the evaluation of atrial fib.  Mr. Denardo with above history including DVT on 11/2021 on eliquis. The eliquis was on hold for the Portacath and was to resume on the 22nd. The day of procedure he developed atrial fib but rate controlled and plan was to discharge and be seen in a fib clinic the following day.  His HR was up 120-130s. Cardiology was asked to admit for observation.    He has rec' d lopressor 2 mg IV once and esmolol but rate continued to be elevated.  No awareness of atrial fib.    Labs from the 16th with Mg+ 1.8,  Na 138 k+ 4.7   Cr 0.69 CBC Hgb 12.9 WBC 4.2 plts 171 BP 106/85   Admitted for further management.   Hospital Course     Atrial Fibrillation with RVR: Initially planned for IV Diltiazem but converted to SR prior to initiation. Placed on metoprolol 12.5mg  BID.  -- Eliquis has been on hold for port-a-cath, to resume on 1/22   Pancreatic Ca: s/p whipple procedure   Hx of DVT: as above Eliquis was held in the setting of port-a-cath -- to resume on 1/22  Patient was seen by Dr. Gwenlyn Found and deemed stable for discharge. Follow up in the office arranged. Medications sent to patient's pharmacy.   Did the patient have an acute coronary syndrome (MI, NSTEMI, STEMI,  etc) this admission?:  No                               Did the patient have a percutaneous coronary intervention (stent / angioplasty)?:  No.    _____________  Discharge Vitals Blood pressure 103/62, pulse 66, temperature 97.9 F (36.6 C), temperature source Oral, resp. rate 15, height 5\' 11"  (1.803 m), weight 71.1 kg, SpO2 97 %.  Filed Weights   12/22/21 1258 12/22/21 2020 12/23/21 0149  Weight: 70.8 kg 71.1 kg 71.1 kg    Labs & Radiologic Studies    CBC Recent Labs    12/20/21 1340 12/22/21 2056 12/23/21 0342  WBC 4.2 5.3 6.3  NEUTROABS 1.4* 4.3  --   HGB 12.9* 14.0 13.0  HCT 39.0 41.5 39.2  MCV 101.6* 101.5* 101.3*  PLT 171 180 614   Basic Metabolic Panel Recent Labs    12/20/21 1340 12/22/21 2056 12/23/21 0342  NA 138 140 140  K 4.7 5.0 4.9  CL 104 104 103  CO2 29 30 29   GLUCOSE 84 135* 118*  BUN 15 11 13   CREATININE 0.69 0.79 0.91  CALCIUM 9.9 9.8 9.5  MG 1.8 1.8  --    Liver Function Tests Recent Labs    12/20/21 1340 12/22/21 2056  AST 42* 103*  ALT 52*  95*  ALKPHOS 194* 262*  BILITOT 0.6 0.7  PROT 7.2 6.9  ALBUMIN 4.0 3.3*   No results for input(s): LIPASE, AMYLASE in the last 72 hours. High Sensitivity Troponin:   No results for input(s): TROPONINIHS in the last 720 hours.  BNP Invalid input(s): POCBNP D-Dimer No results for input(s): DDIMER in the last 72 hours. Hemoglobin A1C Recent Labs    12/22/21 2054  HGBA1C 4.8   Fasting Lipid Panel Recent Labs    12/23/21 0342  CHOL 134  HDL 61  LDLCALC 63  TRIG 49  CHOLHDL 2.2   Thyroid Function Tests Recent Labs    12/22/21 2056  TSH 1.154   _____________  DG CHEST PORT 1 VIEW  Result Date: 12/22/2021 CLINICAL DATA:  Interval placement of Port-A-Cath EXAM: PORTABLE CHEST 1 VIEW COMPARISON:  Chest x-ray dated October 04, 2021 FINDINGS: Interval placement of left chest wall Port-A-Cath with tip overlying the expected area of the lower SVC. Cardiac and mediastinal contours are  within normal limits. Lungs are clear. No pleural effusion or pneumothorax. IMPRESSION: Interval placement of left chest wall Port-A-Cath with tip overlying the expected area of the lower SVC. Electronically Signed   By: Yetta Glassman M.D.   On: 12/22/2021 16:36   DG C-Arm 1-60 Min-No Report  Result Date: 12/22/2021 Fluoroscopy was utilized by the requesting physician.  No radiographic interpretation.   VAS [place] LOWER EXTREMITY VENOUS (DVT)  Result Date: 11/28/2021  Lower Venous DVT Study Patient Name:  Dean [name]  Date of Exam:   11/26/2021 Medical Rec #: [id number]              Accession #:    [id number] Date of Birth: 1966-09-09               Patient Gender: M Patient Age:   82 years Exam Location:  [facility] Procedure:      VAS [place] LOWER EXTREMITY VENOUS (DVT) Referring Phys: Jana Half [facility] --------------------------------------------------------------------------------  Indications: Edema, RT>LT.  Comparison Study: No prior studies. Performing Technologist: Darlin Coco RDMS, RVT  Examination Guidelines: A complete evaluation includes B-mode imaging, spectral Doppler, color Doppler, and power Doppler as needed of all accessible portions of each vessel. Bilateral testing is considered an integral part of a complete examination. Limited examinations for reoccurring indications may be performed as noted. The reflux portion of the exam is performed with the patient in reverse Trendelenburg.  +---------+---------------+---------+-----------+----------+--------------+  RIGHT     Compressibility Phasicity Spontaneity Properties Thrombus Aging  +---------+---------------+---------+-----------+----------+--------------+  CFV       Full            Yes       Yes                                    +---------+---------------+---------+-----------+----------+--------------+  SFJ       Full                                                              +---------+---------------+---------+-----------+----------+--------------+  FV Prox   Full                                                             +---------+---------------+---------+-----------+----------+--------------+  FV Mid    Full                                                             +---------+---------------+---------+-----------+----------+--------------+  FV Distal Full                                                             +---------+---------------+---------+-----------+----------+--------------+  PFV       Full                                                             +---------+---------------+---------+-----------+----------+--------------+  POP       Full            Yes       Yes                                    +---------+---------------+---------+-----------+----------+--------------+  PTV       Full                                                             +---------+---------------+---------+-----------+----------+--------------+  PERO      Full                                                             +---------+---------------+---------+-----------+----------+--------------+  Soleal    Full                                                             +---------+---------------+---------+-----------+----------+--------------+  Gastroc   Full                                                             +---------+---------------+---------+-----------+----------+--------------+   +---------+---------------+---------+-----------+----------+--------------+  LEFT      Compressibility Phasicity Spontaneity Properties Thrombus Aging  +---------+---------------+---------+-----------+----------+--------------+  CFV       Full            Yes       Yes                                    +---------+---------------+---------+-----------+----------+--------------+  SFJ       Full                                                              +---------+---------------+---------+-----------+----------+--------------+  FV Prox   Full                                                             +---------+---------------+---------+-----------+----------+--------------+  FV Mid    Full                                                             +---------+---------------+---------+-----------+----------+--------------+  FV Distal Full                                                             +---------+---------------+---------+-----------+----------+--------------+  PFV       Full                                                             +---------+---------------+---------+-----------+----------+--------------+  POP       Full            Yes       Yes                                    +---------+---------------+---------+-----------+----------+--------------+  PTV       Full                                                             +---------+---------------+---------+-----------+----------+--------------+  PERO      Full                                                             +---------+---------------+---------+-----------+----------+--------------+  Soleal    None            No        No                     Acute           +---------+---------------+---------+-----------+----------+--------------+  Gastroc   Full                                                             +---------+---------------+---------+-----------+----------+--------------+     Summary: RIGHT: - There is no evidence of deep vein thrombosis in the lower extremity.  - No cystic structure found in the popliteal fossa.  LEFT: - Findings consistent with acute deep vein thrombosis involving the left soleal veins. - No cystic structure found in the popliteal fossa.  *See table(s) above for measurements and observations. Electronically signed by Jamelle Haring on 11/28/2021 at 3:23:03 PM.    Final    Disposition   Pt is being discharged home today in good  condition.  Follow-up Plans & Appointments     Follow-up Information     Ladell Pier, MD Follow up on 12/27/2021.   Specialty: Oncology Contact information: [place] [address] [phone]         Stark Klein, MD Follow up.   Specialty: General Surgery Why: as scheduled. Contact information: [address] [place] [address] [phone]         Lorretta Harp, MD Follow up on 01/26/2022.   Specialties: Cardiology, Radiology Why: at 9:30am for your follow up appt Contact information: [address] [place] [address] [phone]                Discharge Instructions     Call MD for:  difficulty breathing, headache or visual disturbances   Complete by: As directed    Call MD for:  hives   Complete by: As directed    Call MD for:  persistant nausea and vomiting   Complete by: As directed    Call MD for:  redness, tenderness, or signs of infection (pain, swelling, redness, odor or green/yellow discharge around incision site)   Complete by: As directed    Call MD for:  severe uncontrolled pain   Complete by: As directed    Call MD for:  temperature >100.4   Complete by: As directed    Diet - low sodium heart healthy   Complete by: As directed    Increase activity slowly   Complete by: As directed        Discharge Medications   Allergies as of 12/23/2021       Reactions   Erythromycin Nausea Only   Ibuprofen Other (See Comments)   Peptic Ulcer nsaids        Medication List     TAKE these medications    ALPRAZolam 0.5 MG tablet Commonly known as: XANAX TAKE 1 TABLET BY MOUTH AT BEDTIME AS NEEDED FOR ANXIETY.   apixaban 5 MG Tabs tablet Commonly known as: ELIQUIS Take 1 tablet (5 mg total) by mouth 2 (two) times daily. Start after starter pack completed Start taking on: December 26, 2021   divalproex 500 MG 24 hr tablet Commonly known as: DEPAKOTE ER TAKE 5  TABLETS (2,500 MG TOTAL) BY MOUTH DAILY. What changed:  how much to take when to take this   levothyroxine 137 MCG tablet Commonly known as: SYNTHROID Take 137 mcg by mouth daily before breakfast.   lidocaine-prilocaine cream Commonly known as: EMLA Apply 1 application topically as needed. Apply 1/2 tablespoon  to port site and cover with Press-and-Seal 2 hours prior to access to numb site. Do not start until 14 days after port is inserted.   metoprolol tartrate 25 MG tablet Commonly known as: LOPRESSOR Take 0.5 tablets (12.5 mg total) by mouth 2 (two) times daily.   ondansetron 8 MG tablet Commonly known as: ZOFRAN Take 1 tablet (8 mg total) by mouth every 8 (eight) hours as needed for nausea or vomiting. Start taking 72 hours after IV chemotherapy treatment given   One Daily Mens 50+ Multivit Tabs Take 1 tablet by mouth daily.   pantoprazole 40 MG tablet Commonly known as: PROTONIX Take 40 mg by mouth daily.   prochlorperazine 10 MG tablet Commonly known as: COMPAZINE Take 1 tablet (10 mg total) by mouth every 6 (six) hours as needed for nausea.         Outstanding Labs/Studies   N/a   Duration of Discharge Encounter   Greater than 30 minutes including physician time.  Signed, Reino Bellis, NP 12/23/2021, 10:13 AM  Agree with note by Reino Bellis NP-C  Patient was seen in the PACU yesterday for A. fib with RVR.  He was having a Port-A-Cath placed.  He has no prior history of A. fib nor does he have any risk factors.  He status post Whipple surgery for pancreatic cancer.  At that time he did develop a DVT and was discharged on Eliquis which was held for the Port-A-Cath.  He self converted to sinus rhythm prior to receiving IV diltiazem.  He was placed on low-dose beta-blocker.  He was originally scheduled for an A. fib clinic this morning which I do not necessarily think he needs to go to.  He can be discharged home with follow-up scheduled to see me in several  weeks.  Lorretta Harp, M.D., [place], [facility], [place] [address] [place], [place]  [address]  [phone] 12/23/2021 10:58 AM

## 2021-12-23 NOTE — Anesthesia Postprocedure Evaluation (Signed)
Anesthesia Post Note  Patient: Dean [name]  Procedure(s) Performed: INSERTION PORT-A-CATH (Left: Chest)     Patient location during evaluation: Nursing Unit Anesthesia Type: General Level of consciousness: awake and alert Pain management: pain level controlled Vital Signs Assessment: post-procedure vital signs reviewed and stable Respiratory status: spontaneous breathing, nonlabored ventilation, respiratory function stable and patient connected to nasal cannula oxygen Cardiovascular status: blood pressure returned to baseline and stable Postop Assessment: no apparent nausea or vomiting Anesthetic complications: no Comments: Patient went into a-fib when wire passed during procedure.  Remained in a-fib post-op.  Cardiology consulted and following.   No notable events documented.  Last Vitals:  Vitals:   12/23/21 0350 12/23/21 0743  BP: 124/80 103/62  Pulse:  66  Resp: 16 15  Temp: 36.7 C 36.6 C  SpO2: 99% 97%    Last Pain:  Vitals:   12/23/21 0743  TempSrc: Oral  PainSc:                  Santa Lighter

## 2021-12-23 NOTE — TOC Progression Note (Signed)
Transition of Care [name]) - Progression Note    Patient Details  Name: Dean [name] MRN: [id number] Date of Birth: Apr 22, 1966  [facility] of [facility]) CM/SW Contact  Zenon Mayo, RN Phone Number: 12/23/2021, 11:32 AM  Clinical Narrative:    from home with wife, chemo patient, went into afib, now in SR, will get chemo on Monday.  TOC will continue to follow for dc needs.         Expected Discharge Plan and Services           Expected Discharge Date: 12/22/21                                     Social Determinants of Health (SDOH) Interventions    Readmission Risk Interventions No flowsheet data found.

## 2021-12-23 NOTE — Plan of Care (Signed)
°  Problem: Health Behavior/Discharge Planning: Goal: Ability to manage health-related needs will improve Outcome: Progressing   Problem: Activity: Goal: Risk for activity intolerance will decrease Outcome: Progressing   Problem: Clinical Measurements: Goal: Cardiovascular complication will be avoided Outcome: Progressing

## 2021-12-23 NOTE — Progress Notes (Incomplete)
Primary Care Physician: Maude Leriche, [place] Primary Cardiologist: Dr Gwenlyn Found Primary Electrophysiologist: none Referring Physician: Dr Aggie Moats D Dean [name] is a 56 y.o. male with a history of bipolar, GERD, hypothyroidism, DVT, pancreatic cancer s/p whipple 11/12/21, atrial fibrillation who presents for consultation in the [facility].  The patient was initially diagnosed with atrial fibrillation 12/22/21. He developed afib with RVR after portacath was placed. Patient was asymptomatic with no awareness of his arrhythmia. Cardiology was consulted and he was given IV BB and  this eventually converted him to SR. Patient is on Eliquis for a CHADS2VASC score of 0 but this is on hold until 1/22 s/p procedure. ***  Today, he denies symptoms of ***palpitations, chest pain, shortness of breath, orthopnea, PND, lower extremity edema, dizziness, presyncope, syncope, snoring, daytime somnolence, bleeding, or neurologic sequela. The patient is tolerating medications without difficulties and is otherwise without complaint today.    Atrial Fibrillation Risk Factors:  he {Action; does/does not:19097} have symptoms or diagnosis of sleep apnea. he {ACTION; IS/IS [id number]} compliant with CPAP therapy. he {Action; does/does not:19097} have a history of rheumatic fever. he {Action; does/does not:19097} have a history of alcohol use. The patient {Action; does/does not:19097} have a history of early familial atrial fibrillation or other arrhythmias.  he has a BMI of There is no height or weight on file to calculate BMI.. There were no vitals filed for this visit.  Family History  Problem Relation Age of Onset   Thyroid cancer Father        dx unknown age   Cancer Maternal Uncle        x2 mat uncles; unknown type;  ?pancreatic/prostate; dx after 46   Breast cancer Paternal Aunt        dx after 48   Cancer Paternal Aunt        x2 paternal aunts; unknown type    Lung cancer Maternal Grandmother        dx after 50; smoking hx   Lung cancer Maternal Grandfather        dx after 50; smoking hx     Atrial Fibrillation Management history:  Previous antiarrhythmic drugs: none Previous cardioversions: none Previous ablations: none CHADS2VASC score: 0 Anticoagulation history: Eliquis   Past Medical History:  Diagnosis Date   Bipolar 1 disorder ([place])    DVT (deep venous thrombosis) ([place]) 11/2021   left leg   Family history of breast cancer 12/16/2021   GERD (gastroesophageal reflux disease)    Hypothyroidism    Pancreas cancer ([place]) 11/12/2021   Thyroid disease    Past Surgical History:  Procedure Laterality Date   BACK SURGERY     BILIARY BRUSHING  10/04/2021   Procedure: BILIARY BRUSHING;  Surgeon: Clarene Essex, MD;  Location: [facility];  Service: Endoscopy;;   BILIARY STENT PLACEMENT N/A 10/04/2021   Procedure: BILIARY STENT PLACEMENT;  Surgeon: Clarene Essex, MD;  Location: [facility];  Service: Endoscopy;  Laterality: N/A;   ERCP Bilateral 10/04/2021   Procedure: ENDOSCOPIC RETROGRADE CHOLANGIOPANCREATOGRAPHY (ERCP);  Surgeon: Clarene Essex, MD;  Location: [facility];  Service: Endoscopy;  Laterality: Bilateral;   ESOPHAGOGASTRODUODENOSCOPY (EGD) WITH PROPOFOL N/A 10/04/2021   Procedure: ESOPHAGOGASTRODUODENOSCOPY (EGD) WITH PROPOFOL;  Surgeon: Clarene Essex, MD;  Location: [facility];  Service: Endoscopy;  Laterality: N/A;   FINE NEEDLE ASPIRATION  10/04/2021   Procedure: FINE NEEDLE ASPIRATION (FNA) LINEAR;  Surgeon: Clarene Essex, MD;  Location: [facility];  Service: Endoscopy;;   FOREIGN  BODY REMOVAL  10/04/2021   Procedure: FOREIGN BODY REMOVAL;  Surgeon: Clarene Essex, MD;  Location: [facility];  Service: Endoscopy;;   KNEE SURGERY     LAPAROSCOPY N/A 11/12/2021   Procedure: DIAGNOSTIC LAPAROSCOPY;  Surgeon: Stark Klein, MD;  Location: [place];  Service: General;  Laterality: N/A;   NOSE SURGERY     PANCREATIC STENT  PLACEMENT  10/04/2021   Procedure: PANCREATIC STENT PLACEMENT;  Surgeon: Clarene Essex, MD;  Location: [facility];  Service: Endoscopy;;   ROTATOR CUFF REPAIR     SPHINCTEROTOMY  10/04/2021   Procedure: Joan Mayans;  Surgeon: Clarene Essex, MD;  Location: [facility];  Service: Endoscopy;;   STENT REMOVAL  10/04/2021   Procedure: STENT REMOVAL;  Surgeon: Clarene Essex, MD;  Location: [facility];  Service: Endoscopy;;   UPPER ESOPHAGEAL ENDOSCOPIC ULTRASOUND (EUS)  10/04/2021   Procedure: UPPER ESOPHAGEAL ENDOSCOPIC ULTRASOUND (EUS);  Surgeon: Clarene Essex, MD;  Location: [facility];  Service: Endoscopy;;   WHIPPLE PROCEDURE N/A 11/12/2021   Procedure: WHIPPLE PROCEDURE;  Surgeon: Stark Klein, MD;  Location: [place];  Service: General;  Laterality: N/A;    No current facility-administered medications for this visit.   No current outpatient medications on file.   Facility-Administered Medications Ordered in Other Visits  Medication Dose Route Frequency Provider Last Rate Last Admin   0.9 %  sodium chloride infusion   Intravenous Continuous Isaiah Serge, NP 10 mL/hr at 12/23/21 0132 Infusion Verify at 12/23/21 0132   acetaminophen (TYLENOL) tablet 650 mg  650 mg Oral Q4H PRN Isaiah Serge, NP       diphenhydrAMINE (BENADRYL) capsule 25 mg  25 mg Oral Q6H PRN Isaiah Serge, NP       divalproex (DEPAKOTE ER) 24 hr tablet 2,000 mg  2,000 mg Oral QHS Isaiah Serge, NP   2,000 mg at 12/22/21 2209   levothyroxine (SYNTHROID) tablet 137 mcg  137 mcg Oral QAC breakfast Isaiah Serge, NP   137 mcg at 12/23/21 0516   metoprolol tartrate (LOPRESSOR) tablet 25 mg  25 mg Oral BID Isaiah Serge, NP   25 mg at 12/22/21 2209   multivitamin with minerals tablet 1 tablet  1 tablet Oral Daily Lorretta Harp, MD       ondansetron [facility]) injection 4 mg  4 mg Intravenous Q6H PRN Isaiah Serge, NP       pantoprazole (PROTONIX) EC tablet 40 mg  40 mg Oral Daily Isaiah Serge, NP   40 mg at  12/23/21 0518    Allergies  Allergen Reactions   Erythromycin Nausea Only   Ibuprofen Other (See Comments)    Peptic Ulcer nsaids    Social History   Socioeconomic History   Marital status: Married    Spouse name: Not on file   Number of children: Not on file   Years of education: Not on file   Highest education level: Not on file  Occupational History   Not on file  Tobacco Use   Smoking status: Former    Types: Cigarettes    Quit date: 2012    Years since quitting: 11.0   Smokeless tobacco: Never  Vaping Use   Vaping Use: Never used  Substance and Sexual Activity   Alcohol use: Not Currently   Drug use: No   Sexual activity: Not on file  Other Topics Concern   Not on file  Social History Narrative   Not on file   Social Determinants of  Health   Financial Resource Strain: Not on file  Food Insecurity: Not on file  Transportation Needs: Not on file  Physical Activity: Not on file  Stress: Not on file  Social Connections: Not on file  Intimate Partner Violence: Not on file     ROS- All systems are reviewed and negative except as per the HPI above.  Physical Exam: There were no vitals filed for this visit.  GEN- The patient is a well appearing ***{Desc; male/male:11659}, alert and oriented x 3 today.   Head- normocephalic, atraumatic Eyes-  Sclera clear, conjunctiva pink Ears- hearing intact Oropharynx- clear Neck- supple  Lungs- Clear to ausculation bilaterally, normal work of breathing Heart- ***Regular rate and rhythm, no murmurs, rubs or gallops  GI- soft, NT, ND, + BS Extremities- no clubbing, cyanosis, or edema MS- no significant deformity or atrophy Skin- no rash or lesion Psych- euthymic mood, full affect Neuro- strength and sensation are intact  Wt Readings from Last 3 Encounters:  12/23/21 71.1 kg  12/16/21 73.7 kg  12/10/21 74 kg    EKG today demonstrates  ***  Epic records are reviewed at length today  CHA2DS2-VASc Score = 0   The patient's score is based upon: CHF History: 0 HTN History: 0 Diabetes History: 0 Stroke History: 0 Vascular Disease History: 0 Age Score: 0 Gender Score: 0   {Confirm score is correct.  If not, click here to update score.  REFRESH note.  :1}    ASSESSMENT AND PLAN: 1. Paroxysmal Atrial Fibrillation (ICD10:  I48.0) The patient's CHA2DS2-VASc score is 0, indicating a 0.2% annual risk of stroke.   Occurred post procedure. General education about afib provided and questions answered. We also discussed his stroke risk and the risks and benefits of anticoagulation. Echocardiogram scheduled for later today. Resume Eliquis as instructed on 12/26/21 for DVT. Anticoagulation for afib not indicated with low CV score. ***metoprolol   2. Pancreatic cancer S/p Whipple 11/12/21 Plans per oncology ***  3. ***   Follow up ***   Adline Peals PA-C [facility] [address], [place] [id number] [phone] 12/23/2021 9:18 AM

## 2021-12-24 ENCOUNTER — Encounter (HOSPITAL_COMMUNITY): Payer: Self-pay

## 2021-12-25 NOTE — Progress Notes (Signed)
Pharmacist Chemotherapy Monitoring - Initial Assessment    Anticipated start date: 12/27/21   The following has been reviewed per standard work regarding the patient's treatment regimen: The patient's diagnosis, treatment plan and drug doses, and organ/hematologic function Lab orders and baseline tests specific to treatment regimen  The treatment plan start date, drug sequencing, and pre-medications Prior authorization status  Patient's documented medication list, including drug-drug interaction screen and prescriptions for anti-emetics and supportive care specific to the treatment regimen The drug concentrations, fluid compatibility, administration routes, and timing of the medications to be used The patient's access for treatment and lifetime cumulative dose history, if applicable  The patient's medication allergies and previous infusion related reactions, if applicable   Changes made to treatment plan:  N/A  Follow up needed:  N/A   Patrica Duel, [facility], 12/25/2021  12:30 PM

## 2021-12-26 ENCOUNTER — Other Ambulatory Visit: Payer: Self-pay | Admitting: Oncology

## 2021-12-27 ENCOUNTER — Other Ambulatory Visit: Payer: Self-pay

## 2021-12-27 ENCOUNTER — Encounter: Payer: Self-pay | Admitting: Nurse Practitioner

## 2021-12-27 ENCOUNTER — Inpatient Hospital Stay (HOSPITAL_BASED_OUTPATIENT_CLINIC_OR_DEPARTMENT_OTHER): Payer: BC Managed Care – PPO | Admitting: Nurse Practitioner

## 2021-12-27 ENCOUNTER — Inpatient Hospital Stay: Payer: BC Managed Care – PPO

## 2021-12-27 ENCOUNTER — Encounter: Payer: Self-pay | Admitting: *Deleted

## 2021-12-27 VITALS — BP 137/82 | HR 63 | Temp 98.7°F | Resp 20 | Ht 71.0 in | Wt 163.4 lb

## 2021-12-27 VITALS — BP 134/86 | HR 57

## 2021-12-27 DIAGNOSIS — Z808 Family history of malignant neoplasm of other organs or systems: Secondary | ICD-10-CM | POA: Diagnosis not present

## 2021-12-27 DIAGNOSIS — C250 Malignant neoplasm of head of pancreas: Secondary | ICD-10-CM

## 2021-12-27 DIAGNOSIS — K831 Obstruction of bile duct: Secondary | ICD-10-CM | POA: Diagnosis not present

## 2021-12-27 DIAGNOSIS — Z87891 Personal history of nicotine dependence: Secondary | ICD-10-CM | POA: Diagnosis not present

## 2021-12-27 DIAGNOSIS — Z5111 Encounter for antineoplastic chemotherapy: Secondary | ICD-10-CM | POA: Diagnosis not present

## 2021-12-27 DIAGNOSIS — Z8042 Family history of malignant neoplasm of prostate: Secondary | ICD-10-CM | POA: Diagnosis not present

## 2021-12-27 DIAGNOSIS — I82462 Acute embolism and thrombosis of left calf muscular vein: Secondary | ICD-10-CM | POA: Diagnosis not present

## 2021-12-27 DIAGNOSIS — Z8719 Personal history of other diseases of the digestive system: Secondary | ICD-10-CM | POA: Diagnosis not present

## 2021-12-27 DIAGNOSIS — Z803 Family history of malignant neoplasm of breast: Secondary | ICD-10-CM | POA: Diagnosis not present

## 2021-12-27 DIAGNOSIS — Z7901 Long term (current) use of anticoagulants: Secondary | ICD-10-CM | POA: Diagnosis not present

## 2021-12-27 DIAGNOSIS — C259 Malignant neoplasm of pancreas, unspecified: Secondary | ICD-10-CM | POA: Diagnosis not present

## 2021-12-27 DIAGNOSIS — Z801 Family history of malignant neoplasm of trachea, bronchus and lung: Secondary | ICD-10-CM | POA: Diagnosis not present

## 2021-12-27 LAB — CBC WITH DIFFERENTIAL (CANCER CENTER ONLY)
Abs Immature Granulocytes: 0.01 10*3/uL (ref 0.00–0.07)
Basophils Absolute: 0 10*3/uL (ref 0.0–0.1)
Basophils Relative: 1 %
Eosinophils Absolute: 0.3 10*3/uL (ref 0.0–0.5)
Eosinophils Relative: 7 %
HCT: 38.6 % — ABNORMAL LOW (ref 39.0–52.0)
Hemoglobin: 13 g/dL (ref 13.0–17.0)
Immature Granulocytes: 0 %
Lymphocytes Relative: 46 %
Lymphs Abs: 2.2 10*3/uL (ref 0.7–4.0)
MCH: 33.4 pg (ref 26.0–34.0)
MCHC: 33.7 g/dL (ref 30.0–36.0)
MCV: 99.2 fL (ref 80.0–100.0)
Monocytes Absolute: 0.4 10*3/uL (ref 0.1–1.0)
Monocytes Relative: 9 %
Neutro Abs: 1.7 10*3/uL (ref 1.7–7.7)
Neutrophils Relative %: 37 %
Platelet Count: 169 10*3/uL (ref 150–400)
RBC: 3.89 MIL/uL — ABNORMAL LOW (ref 4.22–5.81)
RDW: 13.4 % (ref 11.5–15.5)
WBC Count: 4.7 10*3/uL (ref 4.0–10.5)
nRBC: 0 % (ref 0.0–0.2)

## 2021-12-27 LAB — CMP (CANCER CENTER ONLY)
ALT: 86 U/L — ABNORMAL HIGH (ref 0–44)
AST: 73 U/L — ABNORMAL HIGH (ref 15–41)
Albumin: 4.1 g/dL (ref 3.5–5.0)
Alkaline Phosphatase: 223 U/L — ABNORMAL HIGH (ref 38–126)
Anion gap: 8 (ref 5–15)
BUN: 18 mg/dL (ref 6–20)
CO2: 29 mmol/L (ref 22–32)
Calcium: 9.6 mg/dL (ref 8.9–10.3)
Chloride: 103 mmol/L (ref 98–111)
Creatinine: 0.64 mg/dL (ref 0.61–1.24)
GFR, Estimated: >60 mL/min (ref >60)
Glucose, Bld: 89 mg/dL (ref 70–99)
Potassium: 4.2 mmol/L (ref 3.5–5.1)
Sodium: 140 mmol/L (ref 135–145)
Total Bilirubin: 0.5 mg/dL (ref 0.3–1.2)
Total Protein: 7.2 g/dL (ref 6.5–8.1)

## 2021-12-27 MED ORDER — PALONOSETRON HCL INJECTION 0.25 MG/5ML
0.2500 mg | Freq: Once | INTRAVENOUS | Status: AC
  Administered 2021-12-27: 0.25 mg via INTRAVENOUS
  Filled 2021-12-27: qty 5

## 2021-12-27 MED ORDER — ATROPINE SULFATE 1 MG/ML IV SOLN
0.5000 mg | Freq: Once | INTRAVENOUS | Status: DC | PRN
  Filled 2021-12-27: qty 1

## 2021-12-27 MED ORDER — SODIUM CHLORIDE 0.9 % IV SOLN
150.0000 mg | Freq: Once | INTRAVENOUS | Status: AC
  Administered 2021-12-27: 150 mg via INTRAVENOUS
  Filled 2021-12-27: qty 5

## 2021-12-27 MED ORDER — SODIUM CHLORIDE 0.9 % IV SOLN
10.0000 mg | Freq: Once | INTRAVENOUS | Status: AC
  Administered 2021-12-27: 10 mg via INTRAVENOUS
  Filled 2021-12-27: qty 1

## 2021-12-27 MED ORDER — SODIUM CHLORIDE 0.9 % IV SOLN
5000.0000 mg | INTRAVENOUS | Status: DC
  Administered 2021-12-27: 5000 mg via INTRAVENOUS
  Filled 2021-12-27: qty 100

## 2021-12-27 MED ORDER — DEXTROSE 5 % IV SOLN: Freq: Once | INTRAVENOUS | Status: AC

## 2021-12-27 MED ORDER — OXALIPLATIN CHEMO INJECTION 100 MG/20ML
85.0000 mg/m2 | Freq: Once | INTRAVENOUS | Status: AC
  Administered 2021-12-27: 165 mg via INTRAVENOUS
  Filled 2021-12-27: qty 33

## 2021-12-27 MED ORDER — LEUCOVORIN CALCIUM INJECTION 350 MG
400.0000 mg/m2 | Freq: Once | INTRAVENOUS | Status: AC
  Administered 2021-12-27: 772 mg via INTRAVENOUS
  Filled 2021-12-27: qty 25

## 2021-12-27 NOTE — Progress Notes (Signed)
°  [facility] OFFICE PROGRESS NOTE   Diagnosis: Pancreas cancer  INTERVAL HISTORY:   Dean [name] returns as scheduled.  He is scheduled to begin adjuvant FOLFIRINOX today.  Overall he is feeling well.  No nausea or vomiting.  No diarrhea.  He describes his appetite is fairly good.  No numbness or tingling in the hands or feet.  He underwent Port-A-Cath placement 12/22/2021.  He developed atrial fibrillation.  He was admitted overnight for evaluation.  He was seen by Dr. Alvester Chou.  Initial plan was for IV diltiazem.  He converted to sinus rhythm prior to initiation.  He was discharged home on metoprolol 12.5 mg twice daily on 12/23/2021.  Eliquis was on hold for Port-A-Cath placement.  He resumed Eliquis 12/26/2021.  Objective:  Vital signs in last 24 hours:  Blood pressure 137/82, pulse 63, temperature 98.7 F (37.1 C), temperature source Oral, resp. rate 20, height 5\' 11"  (1.803 m), weight 163 lb 6.4 oz (74.1 kg), SpO2 100 %.    HEENT: No thrush or ulcers. Resp: Lungs clear bilaterally. Cardio: Regular rate and rhythm. GI: Abdomen soft and nontender.  No hepatomegaly.  Healed midline surgical incision. Vascular: No leg edema. Port-A-Cath without erythema.  Lab Results:  Lab Results  Component Value Date   WBC 4.7 12/27/2021   HGB 13.0 12/27/2021   HCT 38.6 (L) 12/27/2021   MCV 99.2 12/27/2021   PLT 169 12/27/2021   NEUTROABS 1.7 12/27/2021    Imaging:  No results found.  Medications: I have reviewed the patient's current medications.  Assessment/Plan: Pancreas cancer, status post a pancreaticoduodenectomy 11/12/2021, stage IIb (pT1,pN1) 1/11 lymph nodes, perineural invasion positive, positive margin at the SMA and SMV MRCP 09/27/2021-diffuse intrahepatic and common bile duct dilatation with abrupt termination at the level of the head of the pancreas, indistinct area of hypoenhancement in the pancreas head ERCP with placement of pancreatic duct and bile duct  stents 10/04/2021 EUS 1 10/04/2021-no pancreas mass identified, FNA of pancreas head-"atypical "cells, no adenopathy CT abdomen/pelvis 10/18/2021-no focal liver abnormality, intra and extrahepatic biliary ductal dilatation with narrowing of the common duct and the pancreas head, no enlarged abdominal lymph nodes Cycle 1 FOLFIRINOX 12/27/2021, irinotecan held with cycle 1 Acute pancreatitis 10/04/2021 Bipolar disorder Left soleal DVT 11/26/2021-apixaban Port-A-Cath placement 12/22/2021  Atrial fibrillation 12/22/2021, converted to sinus rhythm, discharged home 12/23/2021 on metoprolol 12.5 mg twice daily, on Eliquis for prior DVT.    Disposition: Dean [name] appears stable.  He is scheduled for cycle 1 FOLFIRINOX today.  Potential toxicities again reviewed.  Irinotecan will be held with this cycle with the plan to deliver with cycle 2 pending tolerance of cycle 1.  He agrees to proceed as above.  CBC from today reviewed.  Counts adequate to proceed with treatment today as planned.  He will return for lab, follow-up, cycle 2 FOLFIRINOX in 2 weeks.  We are available to see him sooner if needed.     Ned Card ANP/GNP-BC   12/27/2021  8:41 AM

## 2021-12-27 NOTE — Patient Instructions (Addendum)
[facility]   The chemotherapy medication bag should finish at 46 hours, 96 hours, or 7 days. For example, if your pump is scheduled for 46 hours and it was put on at 4:00 p.m., it should finish at 2:00 p.m. the day it is scheduled to come off regardless of your appointment time.     Estimated time to finish at  10:30 Wednesday, 12/29/2021.   If the display on your pump reads "Low Volume" and it is beeping, take the batteries out of the pump and come to the cancer center for it to be taken off.   If the pump alarms go off prior to the pump reading "Low Volume" then call [phone] and someone can assist you.  If the plunger comes out and the chemotherapy medication is leaking out, please use your home chemo spill kit to clean up the spill. Do NOT use paper towels or other household products.  If you have problems or questions regarding your pump, please call either [phone] (24 hours a day) or the cancer center Monday-Friday 8:00 a.m.- 4:30 p.m. at the clinic number and we will assist you. If you are unable to get assistance, then go to the nearest Emergency Department and ask the staff to contact the IV team for assistance.  Discharge Instructions: Thank you for choosing [facility] to provide your oncology and hematology care.   If you have a lab appointment with the [facility], please go directly to the [facility] and check in at the registration area.   Wear comfortable clothing and clothing appropriate for easy access to any Portacath or PICC line.   We strive to give you quality time with your provider. You may need to reschedule your appointment if you arrive late (15 or more minutes).  Arriving late affects you and other patients whose appointments are after yours.  Also, if you miss three or more appointments without notifying the office, you may be dismissed from the clinic at the providers discretion.      For prescription  refill requests, have your pharmacy contact our office and allow 72 hours for refills to be completed.    Today you received the following chemotherapy and/or immunotherapy agents Oxaliplatin, leucovorin, fluorouracil      To help prevent nausea and vomiting after your treatment, we encourage you to take your nausea medication as directed.  BELOW ARE SYMPTOMS THAT SHOULD BE REPORTED IMMEDIATELY: *FEVER GREATER THAN 100.4 F (38 C) OR HIGHER *CHILLS OR SWEATING *NAUSEA AND VOMITING THAT IS NOT CONTROLLED WITH YOUR NAUSEA MEDICATION *UNUSUAL SHORTNESS OF BREATH *UNUSUAL BRUISING OR BLEEDING *URINARY PROBLEMS (pain or burning when urinating, or frequent urination) *BOWEL PROBLEMS (unusual diarrhea, constipation, pain near the anus) TENDERNESS IN MOUTH AND THROAT WITH OR WITHOUT PRESENCE OF ULCERS (sore throat, sores in mouth, or a toothache) UNUSUAL RASH, SWELLING OR PAIN  UNUSUAL VAGINAL DISCHARGE OR ITCHING   Items with * indicate a potential emergency and should be followed up as soon as possible or go to the Emergency Department if any problems should occur.  Please show the CHEMOTHERAPY ALERT CARD or IMMUNOTHERAPY ALERT CARD at check-in to the Emergency Department and triage nurse.  Should you have questions after your visit or need to cancel or reschedule your appointment, please contact [facility]  Dept: [phone]  and follow the prompts.  Office hours are 8:00 a.m. to 4:30 p.m. Monday - Friday. Please note that voicemails left  after 4:00 p.m. may not be returned until the following business day.  We are closed weekends and major holidays. You have access to a nurse at all times for urgent questions. Please call the main number to the clinic Dept: [phone] and follow the prompts.   For any non-urgent questions, you may also contact your provider using MyChart. We now offer e-Visits for anyone 59 and older to request care online for non-urgent symptoms.  For details visit [email or web address].   Also download the MyChart app! Go to the app store, search "MyChart", open the app, select [facility], and log in with your MyChart username and password.  Due to Covid, a mask is required upon entering the hospital/clinic. If you do not have a mask, one will be given to you upon arrival. For doctor visits, patients may have 1 support person aged 51 or older with them. For treatment visits, patients cannot have anyone with them due to current Covid guidelines and our immunocompromised population.   Oxaliplatin Injection What is this medication? OXALIPLATIN (ox AL i PLA tin) is a chemotherapy drug. It targets fast dividing cells, like cancer cells, and causes these cells to die. This medicine is used to treat cancers of the colon and rectum, and many other cancers. This medicine may be used for other purposes; ask your health care provider or pharmacist if you have questions. COMMON BRAND NAME(S): Eloxatin What should I tell my care team before I take this medication? They need to know if you have any of these conditions: heart disease history of irregular heartbeat liver disease low blood counts, like white cells, platelets, or red blood cells lung or breathing disease, like asthma take medicines that treat or prevent blood clots tingling of the fingers or toes, or other nerve disorder an unusual or allergic reaction to oxaliplatin, other chemotherapy, other medicines, foods, dyes, or preservatives pregnant or trying to get pregnant breast-feeding How should I use this medication? This drug is given as an infusion into a vein. It is administered in a hospital or clinic by a specially trained health care professional. Talk to your pediatrician regarding the use of this medicine in children. Special care may be needed. Overdosage: If you think you have taken too much of this medicine contact a poison control center or emergency room at once. NOTE:  This medicine is only for you. Do not share this medicine with others. What if I miss a dose? It is important not to miss a dose. Call your doctor or health care professional if you are unable to keep an appointment. What may interact with this medication? Do not take this medicine with any of the following medications: cisapride dronedarone pimozide thioridazine This medicine may also interact with the following medications: aspirin and aspirin-like medicines certain medicines that treat or prevent blood clots like warfarin, apixaban, dabigatran, and rivaroxaban cisplatin cyclosporine diuretics medicines for infection like acyclovir, adefovir, amphotericin B, bacitracin, cidofovir, foscarnet, ganciclovir, gentamicin, pentamidine, vancomycin NSAIDs, medicines for pain and inflammation, like ibuprofen or naproxen other medicines that prolong the QT interval (an abnormal heart rhythm) pamidronate zoledronic acid This list may not describe all possible interactions. Give your health care provider a list of all the medicines, herbs, non-prescription drugs, or dietary supplements you use. Also tell them if you smoke, drink alcohol, or use illegal drugs. Some items may interact with your medicine. What should I watch for while using this medication? Your condition will be monitored carefully while you are receiving this medicine.  You may need blood work done while you are taking this medicine. This medicine may make you feel generally unwell. This is not uncommon as chemotherapy can affect healthy cells as well as cancer cells. Report any side effects. Continue your course of treatment even though you feel ill unless your healthcare professional tells you to stop. This medicine can make you more sensitive to cold. Do not drink cold drinks or use ice. Cover exposed skin before coming in contact with cold temperatures or cold objects. When out in cold weather wear warm clothing and cover your mouth  and nose to warm the air that goes into your lungs. Tell your doctor if you get sensitive to the cold. Do not become pregnant while taking this medicine or for 9 months after stopping it. Women should inform their health care professional if they wish to become pregnant or think they might be pregnant. Men should not father a child while taking this medicine and for 6 months after stopping it. There is potential for serious side effects to an unborn child. Talk to your health care professional for more information. Do not breast-feed a child while taking this medicine or for 3 months after stopping it. This medicine has caused ovarian failure in some women. This medicine may make it more difficult to get pregnant. Talk to your health care professional if you are concerned about your fertility. This medicine has caused decreased sperm counts in some men. This may make it more difficult to father a child. Talk to your health care professional if you are concerned about your fertility. This medicine may increase your risk of getting an infection. Call your health care professional for advice if you get a fever, chills, or sore throat, or other symptoms of a cold or flu. Do not treat yourself. Try to avoid being around people who are sick. Avoid taking medicines that contain aspirin, acetaminophen, ibuprofen, naproxen, or ketoprofen unless instructed by your health care professional. These medicines may hide a fever. Be careful brushing or flossing your teeth or using a toothpick because you may get an infection or bleed more easily. If you have any dental work done, tell your dentist you are receiving this medicine. What side effects may I notice from receiving this medication? Side effects that you should report to your doctor or health care professional as soon as possible: allergic reactions like skin rash, itching or hives, swelling of the face, lips, or tongue breathing problems cough low blood counts  - this medicine may decrease the number of white blood cells, red blood cells, and platelets. You may be at increased risk for infections and bleeding nausea, vomiting pain, redness, or irritation at site where injected pain, tingling, numbness in the hands or feet signs and symptoms of bleeding such as bloody or black, tarry stools; red or dark brown urine; spitting up blood or brown material that looks like coffee grounds; red spots on the skin; unusual bruising or bleeding from the eyes, gums, or nose signs and symptoms of a dangerous change in heartbeat or heart rhythm like chest pain; dizziness; fast, irregular heartbeat; palpitations; feeling faint or lightheaded; falls signs and symptoms of infection like fever; chills; cough; sore throat; pain or trouble passing urine signs and symptoms of liver injury like dark yellow or brown urine; general ill feeling or flu-like symptoms; light-colored stools; loss of appetite; nausea; right upper belly pain; unusually weak or tired; yellowing of the eyes or skin signs and symptoms of low  red blood cells or anemia such as unusually weak or tired; feeling faint or lightheaded; falls signs and symptoms of muscle injury like dark urine; trouble passing urine or change in the amount of urine; unusually weak or tired; muscle pain; back pain Side effects that usually do not require medical attention (report to your doctor or health care professional if they continue or are bothersome): changes in taste diarrhea gas hair loss loss of appetite mouth sores This list may not describe all possible side effects. Call your doctor for medical advice about side effects. You may report side effects to FDA at 1-800-FDA-1088. Where should I keep my medication? This drug is given in a hospital or clinic and will not be stored at home. NOTE: This sheet is a summary. It may not cover all possible information. If you have questions about this medicine, talk to your doctor,  pharmacist, or health care provider.  2022 Elsevier/Gold Standard (2021-08-10 00:00:00)  Leucovorin injection What is this medication? LEUCOVORIN (loo koe VOR in) is used to prevent or treat the harmful effects of some medicines. This medicine is used to treat anemia caused by a low amount of folic acid in the body. It is also used with 5-fluorouracil (5-FU) to treat colon cancer. This medicine may be used for other purposes; ask your health care provider or pharmacist if you have questions. What should I tell my care team before I take this medication? They need to know if you have any of these conditions: anemia from low levels of vitamin B-12 in the blood an unusual or allergic reaction to leucovorin, folic acid, other medicines, foods, dyes, or preservatives pregnant or trying to get pregnant breast-feeding How should I use this medication? This medicine is for injection into a muscle or into a vein. It is given by a health care professional in a hospital or clinic setting. Talk to your pediatrician regarding the use of this medicine in children. Special care may be needed. Overdosage: If you think you have taken too much of this medicine contact a poison control center or emergency room at once. NOTE: This medicine is only for you. Do not share this medicine with others. What if I miss a dose? This does not apply. What may interact with this medication? capecitabine fluorouracil phenobarbital phenytoin primidone trimethoprim-sulfamethoxazole This list may not describe all possible interactions. Give your health care provider a list of all the medicines, herbs, non-prescription drugs, or dietary supplements you use. Also tell them if you smoke, drink alcohol, or use illegal drugs. Some items may interact with your medicine. What should I watch for while using this medication? Your condition will be monitored carefully while you are receiving this medicine. This medicine may  increase the side effects of 5-fluorouracil, 5-FU. Tell your doctor or health care professional if you have diarrhea or mouth sores that do not get better or that get worse. What side effects may I notice from receiving this medication? Side effects that you should report to your doctor or health care professional as soon as possible: allergic reactions like skin rash, itching or hives, swelling of the face, lips, or tongue breathing problems fever, infection mouth sores unusual bleeding or bruising unusually weak or tired Side effects that usually do not require medical attention (report to your doctor or health care professional if they continue or are bothersome): constipation or diarrhea loss of appetite nausea, vomiting This list may not describe all possible side effects. Call your doctor for medical advice  about side effects. You may report side effects to FDA at 1-800-FDA-1088. Where should I keep my medication? This drug is given in a hospital or clinic and will not be stored at home. NOTE: This sheet is a summary. It may not cover all possible information. If you have questions about this medicine, talk to your doctor, pharmacist, or health care provider.  2022 Elsevier/Gold Standard (2008-05-29 00:00:00)  Fluorouracil, 5-FU injection What is this medication? FLUOROURACIL, 5-FU (flure oh YOOR a sil) is a chemotherapy drug. It slows the growth of cancer cells. This medicine is used to treat many types of cancer like breast cancer, colon or rectal cancer, pancreatic cancer, and stomach cancer. This medicine may be used for other purposes; ask your health care provider or pharmacist if you have questions. COMMON BRAND NAME(S): Adrucil What should I tell my care team before I take this medication? They need to know if you have any of these conditions: blood disorders dihydropyrimidine dehydrogenase (DPD) deficiency infection (especially a virus infection such as chickenpox, cold  sores, or herpes) kidney disease liver disease malnourished, poor nutrition recent or ongoing radiation therapy an unusual or allergic reaction to fluorouracil, other chemotherapy, other medicines, foods, dyes, or preservatives pregnant or trying to get pregnant breast-feeding How should I use this medication? This drug is given as an infusion or injection into a vein. It is administered in a hospital or clinic by a specially trained health care professional. Talk to your pediatrician regarding the use of this medicine in children. Special care may be needed. Overdosage: If you think you have taken too much of this medicine contact a poison control center or emergency room at once. NOTE: This medicine is only for you. Do not share this medicine with others. What if I miss a dose? It is important not to miss your dose. Call your doctor or health care professional if you are unable to keep an appointment. What may interact with this medication? Do not take this medicine with any of the following medications: live virus vaccines This medicine may also interact with the following medications: medicines that treat or prevent blood clots like warfarin, enoxaparin, and dalteparin This list may not describe all possible interactions. Give your health care provider a list of all the medicines, herbs, non-prescription drugs, or dietary supplements you use. Also tell them if you smoke, drink alcohol, or use illegal drugs. Some items may interact with your medicine. What should I watch for while using this medication? Visit your doctor for checks on your progress. This drug may make you feel generally unwell. This is not uncommon, as chemotherapy can affect healthy cells as well as cancer cells. Report any side effects. Continue your course of treatment even though you feel ill unless your doctor tells you to stop. In some cases, you may be given additional medicines to help with side effects. Follow all  directions for their use. Call your doctor or health care professional for advice if you get a fever, chills or sore throat, or other symptoms of a cold or flu. Do not treat yourself. This drug decreases your body's ability to fight infections. Try to avoid being around people who are sick. This medicine may increase your risk to bruise or bleed. Call your doctor or health care professional if you notice any unusual bleeding. Be careful brushing and flossing your teeth or using a toothpick because you may get an infection or bleed more easily. If you have any dental work done, tell  your dentist you are receiving this medicine. Avoid taking products that contain aspirin, acetaminophen, ibuprofen, naproxen, or ketoprofen unless instructed by your doctor. These medicines may hide a fever. Do not become pregnant while taking this medicine. Women should inform their doctor if they wish to become pregnant or think they might be pregnant. There is a potential for serious side effects to an unborn child. Talk to your health care professional or pharmacist for more information. Do not breast-feed an infant while taking this medicine. Men should inform their doctor if they wish to father a child. This medicine may lower sperm counts. Do not treat diarrhea with over the counter products. Contact your doctor if you have diarrhea that lasts more than 2 days or if it is severe and watery. This medicine can make you more sensitive to the sun. Keep out of the sun. If you cannot avoid being in the sun, wear protective clothing and use sunscreen. Do not use sun lamps or tanning beds/booths. What side effects may I notice from receiving this medication? Side effects that you should report to your doctor or health care professional as soon as possible: allergic reactions like skin rash, itching or hives, swelling of the face, lips, or tongue low blood counts - this medicine may decrease the number of white blood cells, red  blood cells and platelets. You may be at increased risk for infections and bleeding. signs of infection - fever or chills, cough, sore throat, pain or difficulty passing urine signs of decreased platelets or bleeding - bruising, pinpoint red spots on the skin, black, tarry stools, blood in the urine signs of decreased red blood cells - unusually weak or tired, fainting spells, lightheadedness breathing problems changes in vision chest pain mouth sores nausea and vomiting pain, swelling, redness at site where injected pain, tingling, numbness in the hands or feet redness, swelling, or sores on hands or feet stomach pain unusual bleeding Side effects that usually do not require medical attention (report to your doctor or health care professional if they continue or are bothersome): changes in finger or toe nails diarrhea dry or itchy skin hair loss headache loss of appetite sensitivity of eyes to the light stomach upset unusually teary eyes This list may not describe all possible side effects. Call your doctor for medical advice about side effects. You may report side effects to FDA at 1-800-FDA-1088. Where should I keep my medication? This drug is given in a hospital or clinic and will not be stored at home. NOTE: This sheet is a summary. It may not cover all possible information. If you have questions about this medicine, talk to your doctor, pharmacist, or health care provider.  2022 Elsevier/Gold Standard (2021-08-10 00:00:00)

## 2021-12-27 NOTE — Progress Notes (Signed)
Patient seen by Ned Card NP today  Vitals are within treatment parameters.  Labs reviewed by Ned Card NP and are within treatment parameters. ALT and AST are improved  Per physician team, patient is ready for treatment and there are NO modifications to the treatment plan.

## 2021-12-27 NOTE — Progress Notes (Signed)
Patient presents for treatment. RN assessment completed along with the following:  Labs/vitals reviewed - Yes, and within treatment parameters.   Weight within 10% of previous measurement - Yes Informed consent completed and reflects current therapy/intent - Yes, on date 12/27/2021             Provider progress note reviewed - Yes, today's provider note was reviewed. Treatment/Antibody/Supportive plan reviewed - Yes, and there are no adjustments needed for today's treatment. S&H and other orders reviewed - Yes, and there are no additional orders identified. Previous treatment date reviewed - Yes, and the appropriate amount of time has elapsed between treatments. Clinic Hand Off Received from - Cristy Friedlander, RN  Patient to proceed with treatment.

## 2021-12-28 ENCOUNTER — Encounter: Payer: Self-pay | Admitting: *Deleted

## 2021-12-28 NOTE — Progress Notes (Signed)
PATIENT NAVIGATOR PROGRESS NOTE  Name: Dean [name] Date: 12/28/2021 MRN: [id number]  DOB: [birth year]   Reason for visit:  F/U phone call after first treatment  Comments:  Called and spoke with [name] after his first treatment FOLFOX. Tolerated well, eating and drinking, no nausea. Not experiencing cold experience.  Encouraged to call with any issues or questions    Time spent counseling/coordinating care: 15-30 minutes

## 2021-12-29 ENCOUNTER — Other Ambulatory Visit: Payer: Self-pay

## 2021-12-29 ENCOUNTER — Inpatient Hospital Stay: Payer: BC Managed Care – PPO | Admitting: Nutrition

## 2021-12-29 ENCOUNTER — Inpatient Hospital Stay: Payer: BC Managed Care – PPO

## 2021-12-29 VITALS — BP 132/82 | HR 72 | Temp 98.1°F | Resp 18

## 2021-12-29 DIAGNOSIS — Z87891 Personal history of nicotine dependence: Secondary | ICD-10-CM | POA: Diagnosis not present

## 2021-12-29 DIAGNOSIS — Z8719 Personal history of other diseases of the digestive system: Secondary | ICD-10-CM | POA: Diagnosis not present

## 2021-12-29 DIAGNOSIS — Z5111 Encounter for antineoplastic chemotherapy: Secondary | ICD-10-CM | POA: Diagnosis not present

## 2021-12-29 DIAGNOSIS — Z808 Family history of malignant neoplasm of other organs or systems: Secondary | ICD-10-CM | POA: Diagnosis not present

## 2021-12-29 DIAGNOSIS — C250 Malignant neoplasm of head of pancreas: Secondary | ICD-10-CM

## 2021-12-29 DIAGNOSIS — I82462 Acute embolism and thrombosis of left calf muscular vein: Secondary | ICD-10-CM | POA: Diagnosis not present

## 2021-12-29 DIAGNOSIS — Z803 Family history of malignant neoplasm of breast: Secondary | ICD-10-CM | POA: Diagnosis not present

## 2021-12-29 DIAGNOSIS — Z8042 Family history of malignant neoplasm of prostate: Secondary | ICD-10-CM | POA: Diagnosis not present

## 2021-12-29 DIAGNOSIS — Z7901 Long term (current) use of anticoagulants: Secondary | ICD-10-CM | POA: Diagnosis not present

## 2021-12-29 DIAGNOSIS — Z801 Family history of malignant neoplasm of trachea, bronchus and lung: Secondary | ICD-10-CM | POA: Diagnosis not present

## 2021-12-29 MED ORDER — SODIUM CHLORIDE 0.9% FLUSH
10.0000 mL | INTRAVENOUS | Status: DC | PRN
  Administered 2021-12-29: 11:00:00 10 mL

## 2021-12-29 MED ORDER — HEPARIN SOD (PORK) LOCK FLUSH 100 UNIT/ML IV SOLN
500.0000 [IU] | Freq: Once | INTRAVENOUS | Status: AC | PRN
  Administered 2021-12-29: 11:00:00 500 [IU]

## 2021-12-29 NOTE — Patient Instructions (Signed)

## 2021-12-29 NOTE — Progress Notes (Signed)
56 year old male diagnosed with Pancreas cancer S/P [facility] on Nov 12, 2021.  PMH includes Bipolar 1, GERD, Hypothyroidism.  Medications include Xanax, Synthroid, Protonix, MVI  Labs reviewed.  Height: 5'11". Weight: 163.4 pounds Jan 23. Stable from 163.2 pounds on Jan 6. UBW: 185 pounds per patient. BMI: 22.79  Patient reports doing well. He feels good and is eating well. His appetite is good. He denies nausea, vomiting, diarrhea, and constipation. Reports he has changed the way he eats and no longer eats high fat foods. Tried some potato chips but had loose stools after eating.  Nutrition Diagnosis:  Food and Nutrition Related Knowledge Deficit related to pancreas cancer and associated treatments as evidenced by no prior need for nutrition related information.  Intervention:  Educated on importance of small frequent meals and snacks.  Recommend he discuss pancreatic enzymes with MD on follow up visit. Educated on sources of calories and protein. Encouraged wt maintenance/wt gain. Questions answered. Fact sheets given. Provided contact information.  Monitoring, Evaluation, Goals: Patient will tolerate adequate calories and protein to minimize wt loss throughout treatment.  Next Visit: To be scheduled as needed.

## 2022-01-08 ENCOUNTER — Other Ambulatory Visit: Payer: Self-pay | Admitting: Oncology

## 2022-01-10 ENCOUNTER — Encounter: Payer: Self-pay | Admitting: Nurse Practitioner

## 2022-01-10 ENCOUNTER — Other Ambulatory Visit: Payer: Self-pay

## 2022-01-10 ENCOUNTER — Inpatient Hospital Stay: Payer: BC Managed Care – PPO

## 2022-01-10 ENCOUNTER — Inpatient Hospital Stay: Payer: BC Managed Care – PPO | Attending: Oncology

## 2022-01-10 ENCOUNTER — Inpatient Hospital Stay (HOSPITAL_BASED_OUTPATIENT_CLINIC_OR_DEPARTMENT_OTHER): Payer: BC Managed Care – PPO | Admitting: Nurse Practitioner

## 2022-01-10 VITALS — BP 128/83 | HR 55 | Temp 97.8°F | Resp 20 | Ht 71.0 in | Wt 165.4 lb

## 2022-01-10 DIAGNOSIS — D709 Neutropenia, unspecified: Secondary | ICD-10-CM | POA: Insufficient documentation

## 2022-01-10 DIAGNOSIS — K859 Acute pancreatitis without necrosis or infection, unspecified: Secondary | ICD-10-CM | POA: Diagnosis not present

## 2022-01-10 DIAGNOSIS — Z7901 Long term (current) use of anticoagulants: Secondary | ICD-10-CM | POA: Diagnosis not present

## 2022-01-10 DIAGNOSIS — Z5189 Encounter for other specified aftercare: Secondary | ICD-10-CM | POA: Diagnosis not present

## 2022-01-10 DIAGNOSIS — C250 Malignant neoplasm of head of pancreas: Secondary | ICD-10-CM | POA: Insufficient documentation

## 2022-01-10 DIAGNOSIS — I4891 Unspecified atrial fibrillation: Secondary | ICD-10-CM | POA: Diagnosis not present

## 2022-01-10 DIAGNOSIS — Z86718 Personal history of other venous thrombosis and embolism: Secondary | ICD-10-CM | POA: Diagnosis not present

## 2022-01-10 DIAGNOSIS — F319 Bipolar disorder, unspecified: Secondary | ICD-10-CM | POA: Diagnosis not present

## 2022-01-10 DIAGNOSIS — Z95828 Presence of other vascular implants and grafts: Secondary | ICD-10-CM | POA: Diagnosis not present

## 2022-01-10 LAB — CMP (CANCER CENTER ONLY)
ALT: 228 U/L — ABNORMAL HIGH (ref 0–44)
AST: 127 U/L — ABNORMAL HIGH (ref 15–41)
Albumin: 4 g/dL (ref 3.5–5.0)
Alkaline Phosphatase: 254 U/L — ABNORMAL HIGH (ref 38–126)
Anion gap: 8 (ref 5–15)
BUN: 15 mg/dL (ref 6–20)
CO2: 27 mmol/L (ref 22–32)
Calcium: 9.3 mg/dL (ref 8.9–10.3)
Chloride: 104 mmol/L (ref 98–111)
Creatinine: 0.68 mg/dL (ref 0.61–1.24)
GFR, Estimated: >60 mL/min (ref >60)
Glucose, Bld: 99 mg/dL (ref 70–99)
Potassium: 4.1 mmol/L (ref 3.5–5.1)
Sodium: 139 mmol/L (ref 135–145)
Total Bilirubin: 0.5 mg/dL (ref 0.3–1.2)
Total Protein: 6.6 g/dL (ref 6.5–8.1)

## 2022-01-10 LAB — CBC WITH DIFFERENTIAL (CANCER CENTER ONLY)
Abs Immature Granulocytes: 0.01 10*3/uL (ref 0.00–0.07)
Basophils Absolute: 0 10*3/uL (ref 0.0–0.1)
Basophils Relative: 1 %
Eosinophils Absolute: 0.3 10*3/uL (ref 0.0–0.5)
Eosinophils Relative: 8 %
HCT: 38.4 % — ABNORMAL LOW (ref 39.0–52.0)
Hemoglobin: 12.8 g/dL — ABNORMAL LOW (ref 13.0–17.0)
Immature Granulocytes: 0 %
Lymphocytes Relative: 49 %
Lymphs Abs: 1.9 10*3/uL (ref 0.7–4.0)
MCH: 32.7 pg (ref 26.0–34.0)
MCHC: 33.3 g/dL (ref 30.0–36.0)
MCV: 98 fL (ref 80.0–100.0)
Monocytes Absolute: 0.4 10*3/uL (ref 0.1–1.0)
Monocytes Relative: 12 %
Neutro Abs: 1.2 10*3/uL — ABNORMAL LOW (ref 1.7–7.7)
Neutrophils Relative %: 30 %
Platelet Count: 155 10*3/uL (ref 150–400)
RBC: 3.92 MIL/uL — ABNORMAL LOW (ref 4.22–5.81)
RDW: 13.4 % (ref 11.5–15.5)
WBC Count: 3.8 10*3/uL — ABNORMAL LOW (ref 4.0–10.5)
nRBC: 0 % (ref 0.0–0.2)

## 2022-01-10 MED ORDER — SODIUM CHLORIDE 0.9% FLUSH
10.0000 mL | INTRAVENOUS | Status: DC | PRN
  Administered 2022-01-10: 10 mL via INTRAVENOUS

## 2022-01-10 MED ORDER — HEPARIN SOD (PORK) LOCK FLUSH 100 UNIT/ML IV SOLN
500.0000 [IU] | Freq: Once | INTRAVENOUS | Status: AC
  Administered 2022-01-10: 500 [IU] via INTRAVENOUS

## 2022-01-10 NOTE — Progress Notes (Signed)
°  [place] OFFICE PROGRESS NOTE   Diagnosis: Pancreas cancer  INTERVAL HISTORY:   Dean [name] returns as scheduled.  He completed a cycle of FOLFOX 12/27/2021.  He denies nausea/vomiting.  No mouth sores.  No diarrhea.  He did not experience cold sensitivity.  Objective:  Vital signs in last 24 hours:  Blood pressure 128/83, pulse (!) 55, temperature 97.8 F (36.6 C), temperature source Oral, resp. rate 20, height 5\' 11"  (1.803 m), weight 165 lb 6.4 oz (75 kg), SpO2 98 %.    HEENT: No thrush or ulcers. Resp: Lungs clear bilaterally. Cardio: Regular rate and rhythm. GI: Abdomen soft and nontender.  No hepatomegaly. Vascular: No leg edema. Skin: Palms without erythema. Port-A-Cath without erythema.   Lab Results:  Lab Results  Component Value Date   WBC 3.8 (L) 01/10/2022   HGB 12.8 (L) 01/10/2022   HCT 38.4 (L) 01/10/2022   MCV 98.0 01/10/2022   PLT 155 01/10/2022   NEUTROABS 1.2 (L) 01/10/2022    Imaging:  No results found.  Medications: I have reviewed the patient's current medications.  Assessment/Plan: Pancreas cancer, status post a pancreaticoduodenectomy 11/12/2021, stage IIb (pT1,pN1) 1/11 lymph nodes, perineural invasion positive, positive margin at the SMA and SMV MRCP 09/27/2021-diffuse intrahepatic and common bile duct dilatation with abrupt termination at the level of the head of the pancreas, indistinct area of hypoenhancement in the pancreas head ERCP with placement of pancreatic duct and bile duct stents 10/04/2021 EUS 1 10/04/2021-no pancreas mass identified, FNA of pancreas head-"atypical "cells, no adenopathy CT abdomen/pelvis 10/18/2021-no focal liver abnormality, intra and extrahepatic biliary ductal dilatation with narrowing of the common duct and the pancreas head, no enlarged abdominal lymph nodes Cycle 1 FOLFIRINOX 12/27/2021, irinotecan held with cycle 1 Acute pancreatitis 10/04/2021 Bipolar disorder Left soleal DVT  11/26/2021-apixaban Port-A-Cath placement 12/22/2021  Atrial fibrillation 12/22/2021, converted to sinus rhythm, discharged home 12/23/2021 on metoprolol 12.5 mg twice daily, on Eliquis for prior DVT. Neutropenia secondary to chemotherapy 01/10/2022  Disposition: Dean [name] appears stable.  He completed a cycle of FOLFOX 2 weeks ago.   The plan was to add irinotecan to treatment today if he tolerated well.  He did tolerate well but has developed neutropenia, ANC 1.2.  We are holding today's treatment and rescheduling for 1 week, plan for FOLFIRINOX with white cell growth factor support on the day of pump discontinuation.  We discussed potential toxicities associated with white cell growth factor support including bone pain, rash, splenic rupture.  He agrees with this plan.  Neutropenic precautions reviewed.  He understands to contact the office with fever, chills, other signs of infection.  We will see him in follow-up prior to treatment next week.  He will contact the office in the interim as outlined above or with any other problems.    Ned Card ANP/GNP-BC   01/10/2022  9:04 AM

## 2022-01-10 NOTE — Patient Instructions (Signed)

## 2022-01-11 ENCOUNTER — Ambulatory Visit: Payer: Self-pay | Admitting: Genetic Counselor

## 2022-01-11 ENCOUNTER — Telehealth: Payer: Self-pay | Admitting: Genetic Counselor

## 2022-01-11 ENCOUNTER — Other Ambulatory Visit: Payer: Self-pay | Admitting: Radiation Oncology

## 2022-01-11 ENCOUNTER — Encounter: Payer: Self-pay | Admitting: Genetic Counselor

## 2022-01-11 DIAGNOSIS — Z1379 Encounter for other screening for genetic and chromosomal anomalies: Secondary | ICD-10-CM | POA: Insufficient documentation

## 2022-01-11 DIAGNOSIS — Z803 Family history of malignant neoplasm of breast: Secondary | ICD-10-CM

## 2022-01-11 DIAGNOSIS — C250 Malignant neoplasm of head of pancreas: Secondary | ICD-10-CM

## 2022-01-11 NOTE — Telephone Encounter (Signed)
Revealed negative genetic testing.  Discussed that we do not know why he has pancreatic cancer or why there is cancer in the family. It could be familial, due to a different gene that we are not testing, or maybe our current technology may not be able to pick something up.  It will be important for him to keep in contact with genetics to keep up with whether additional testing may be needed.

## 2022-01-11 NOTE — Progress Notes (Signed)
HPI:   Dean [name] was previously seen in the [facility] clinic due to a personal history of pancreatic cancer and concerns regarding a hereditary predisposition to cancer. Please refer to our prior cancer genetics clinic note for more information regarding our discussion, assessment and recommendations, at the time. Dean [name]'s recent genetic test results were disclosed to him, as were recommendations warranted by these results. These results and recommendations are discussed in more detail below.  CANCER HISTORY:  Oncology History  Primary cancer of head of pancreas ([place])  12/10/2021 Initial Diagnosis   Primary cancer of head of pancreas ([place])   12/10/2021 Cancer Staging   Staging form: Exocrine Pancreas, AJCC 8th Edition - Clinical: Stage IIB (cT1c, cN1, cM0) - Signed by Ladell Pier, MD on 12/10/2021 Histologic grade (G): G2 Histologic grading system: 3 grade system Perineural invasion (PNI): Present    12/27/2021 -  Chemotherapy   Patient is on Treatment Plan : PANCREAS Modified FOLFIRINOX q14d x 12 cycles     01/07/2022 Genetic Testing   Negative hereditary cancer genetic testing: no pathogenic variants detected in [facility] CancerNext-Expanded +RNAinsight Panel.  Variant of uncertain significance detected in LZTR1 at c.1366G>A (p.V456M).  Report date is January 07, 2022.   The CancerNext-Expanded gene panel offered by [facility] and includes sequencing, rearrangement, and RNA analysis for the following 77 genes: AIP, ALK, APC, ATM, AXIN2, BAP1, BARD1, BLM, BMPR1A, BRCA1, BRCA2, BRIP1, CDC73, CDH1, CDK4, CDKN1B, CDKN2A, CHEK2, CTNNA1, DICER1, FANCC, FH, FLCN, GALNT12, KIF1B, LZTR1, MAX, MEN1, MET, MLH1, MSH2, MSH3, MSH6, MUTYH, NBN, NF1, NF2, NTHL1, PALB2, PHOX2B, PMS2, POT1, PRKAR1A, PTCH1, PTEN, RAD51C, RAD51D, RB1, RECQL, RET, SDHA, SDHAF2, SDHB, SDHC, SDHD, SMAD4, SMARCA4, SMARCB1, SMARCE1, STK11, SUFU, TMEM127, TP53, TSC1, TSC2, VHL and XRCC2 (sequencing and  deletion/duplication); EGFR, EGLN1, HOXB13, KIT, MITF, PDGFRA, POLD1, and POLE (sequencing only); EPCAM and GREM1 (deletion/duplication only).      FAMILY HISTORY:  We obtained a detailed, 4-generation family history.  Significant diagnoses are listed below: Family History  Problem Relation Age of Onset   Thyroid cancer Father        dx unknown age   Cancer Maternal Uncle        x2 mat uncles; unknown type;  ?pancreatic/prostate; dx after 45   Breast cancer Paternal Aunt        dx after 62   Cancer Paternal Aunt        x2 paternal aunts; unknown type   Lung cancer Maternal Grandmother        dx after 50; smoking hx   Lung cancer Maternal Grandfather        dx after 9; smoking hx     Dean [name] is unaware of previous family history of genetic testing for hereditary cancer risks. There is no reported Ashkenazi Jewish ancestry. There is no known consanguinity.    GENETIC TEST RESULTS:  The [facility] CancerNext-Expanded +RNAinsight Panel found no pathogenic mutations. The CancerNext-Expanded gene panel offered by [facility] and includes sequencing, rearrangement, and RNA analysis for the following 77 genes: AIP, ALK, APC, ATM, AXIN2, BAP1, BARD1, BLM, BMPR1A, BRCA1, BRCA2, BRIP1, CDC73, CDH1, CDK4, CDKN1B, CDKN2A, CHEK2, CTNNA1, DICER1, FANCC, FH, FLCN, GALNT12, KIF1B, LZTR1, MAX, MEN1, MET, MLH1, MSH2, MSH3, MSH6, MUTYH, NBN, NF1, NF2, NTHL1, PALB2, PHOX2B, PMS2, POT1, PRKAR1A, PTCH1, PTEN, RAD51C, RAD51D, RB1, RECQL, RET, SDHA, SDHAF2, SDHB, SDHC, SDHD, SMAD4, SMARCA4, SMARCB1, SMARCE1, STK11, SUFU, TMEM127, TP53, TSC1, TSC2, VHL and XRCC2 (sequencing and deletion/duplication); EGFR, EGLN1, HOXB13, KIT,  MITF, PDGFRA, POLD1, and POLE (sequencing only); EPCAM and GREM1 (deletion/duplication only).   The test report has been scanned into EPIC and is located under the Molecular Pathology section of the Results Review tab.  A portion of the result report is included below for reference.  Genetic testing reported out on January 07, 2022.      Genetic testing identified a variant of uncertain significance (VUS) in the LZTR1 gene called c.1366G>A (p.V456M).  At this time, it is unknown if this variant is associated with an increased risk for cancer or if it is benign, but most uncertain variants are reclassified to benign. It should not be used to make medical management decisions. With time, we suspect the laboratory will determine the significance of this variant, if any. If the laboratory reclassifies this variant, we will attempt to contact Dean [name] to discuss it further.   Even though a pathogenic variant was not identified, possible explanations for the cancer in the family may include: There may be no hereditary risk for cancer in the family. The cancers in Dean [name] and/or his family may be sporadic/familial or due to other genetic and environmental factors. There may be a gene mutation in one of these genes that current testing methods cannot detect but that chance is small. There could be another gene that has not yet been discovered, or that we have not yet tested, that is responsible for the cancer diagnoses in the family.  It is also possible there is a hereditary cause for the cancer in the family that Dean [name] did not inherit.   Therefore, it is important to remain in touch with cancer genetics in the future so that we can continue to offer Dean [name] the most up to date genetic testing.   ADDITIONAL GENETIC TESTING:  We discussed with Dean [name] that his genetic testing was fairly extensive.  If there are genes identified to increase cancer risk that can be analyzed in the future, we would be happy to discuss and coordinate this testing at that time.    CANCER SCREENING RECOMMENDATIONS:  Dean [name]'s test result is considered negative (normal).  This means that we have not identified a hereditary cause for his personal history of  pancreatic cancer at this time.   An individual's cancer risk and medical management are not determined by genetic test results alone. Overall cancer risk assessment incorporates additional factors, including personal medical history, family history, and any available genetic information that may result in a personalized plan for cancer prevention and surveillance. Therefore, it is recommended he continue to follow the cancer management and screening guidelines provided by his oncology and primary healthcare provider.  RECOMMENDATIONS FOR FAMILY MEMBERS:   Since he did not inherit a identifiable mutation in a cancer predisposition gene included on this panel, his children could not have inherited a known mutation from him in one of these genes. Individuals in this family might be at some increased risk of developing cancer, over the general population risk, due to the family history of cancer.  Individuals in the family should notify their providers of the family history of cancer. We recommend women in this family have a yearly mammogram beginning at age 53, or 6 years younger than the earliest onset of cancer, an annual clinical breast exam, and perform monthly breast self-exams.   We do not recommend familial testing for the LZTR1 variant of uncertain significance (VUS).  FOLLOW-UP:  Lastly, we discussed with Dean [name] that cancer genetics  is a rapidly advancing field and it is possible that new genetic tests will be appropriate for him and/or his family members in the future. We encouraged him to remain in contact with cancer genetics on an annual basis so we can update his personal and family histories and let him know of advances in cancer genetics that may benefit this family.   Our contact number was provided. Dean [name]'s questions were answered to his satisfaction, and he knows he is welcome to call us at anytime with additional questions or concerns.   Yecheskel Kurek M. Joette Catching, [place],  [facility] Genetic Counselor [email or web address] (P) [phone]

## 2022-01-12 ENCOUNTER — Inpatient Hospital Stay: Payer: BC Managed Care – PPO

## 2022-01-17 ENCOUNTER — Inpatient Hospital Stay: Payer: BC Managed Care – PPO

## 2022-01-17 ENCOUNTER — Other Ambulatory Visit: Payer: Self-pay

## 2022-01-17 DIAGNOSIS — I4891 Unspecified atrial fibrillation: Secondary | ICD-10-CM | POA: Diagnosis not present

## 2022-01-17 DIAGNOSIS — C250 Malignant neoplasm of head of pancreas: Secondary | ICD-10-CM

## 2022-01-17 DIAGNOSIS — F319 Bipolar disorder, unspecified: Secondary | ICD-10-CM | POA: Diagnosis not present

## 2022-01-17 DIAGNOSIS — D709 Neutropenia, unspecified: Secondary | ICD-10-CM | POA: Diagnosis not present

## 2022-01-17 DIAGNOSIS — Z86718 Personal history of other venous thrombosis and embolism: Secondary | ICD-10-CM | POA: Diagnosis not present

## 2022-01-17 DIAGNOSIS — K859 Acute pancreatitis without necrosis or infection, unspecified: Secondary | ICD-10-CM | POA: Diagnosis not present

## 2022-01-17 DIAGNOSIS — Z5189 Encounter for other specified aftercare: Secondary | ICD-10-CM | POA: Diagnosis not present

## 2022-01-17 DIAGNOSIS — Z7901 Long term (current) use of anticoagulants: Secondary | ICD-10-CM | POA: Diagnosis not present

## 2022-01-17 LAB — CMP (CANCER CENTER ONLY)
ALT: 149 U/L — ABNORMAL HIGH (ref 0–44)
AST: 86 U/L — ABNORMAL HIGH (ref 15–41)
Albumin: 4 g/dL (ref 3.5–5.0)
Alkaline Phosphatase: 241 U/L — ABNORMAL HIGH (ref 38–126)
Anion gap: 8 (ref 5–15)
BUN: 18 mg/dL (ref 6–20)
CO2: 26 mmol/L (ref 22–32)
Calcium: 9.3 mg/dL (ref 8.9–10.3)
Chloride: 105 mmol/L (ref 98–111)
Creatinine: 0.67 mg/dL (ref 0.61–1.24)
GFR, Estimated: >60 mL/min (ref >60)
Glucose, Bld: 99 mg/dL (ref 70–99)
Potassium: 4 mmol/L (ref 3.5–5.1)
Sodium: 139 mmol/L (ref 135–145)
Total Bilirubin: 0.4 mg/dL (ref 0.3–1.2)
Total Protein: 6.5 g/dL (ref 6.5–8.1)

## 2022-01-17 LAB — CBC WITH DIFFERENTIAL (CANCER CENTER ONLY)
Abs Immature Granulocytes: 0.01 10*3/uL (ref 0.00–0.07)
Basophils Absolute: 0 10*3/uL (ref 0.0–0.1)
Basophils Relative: 1 %
Eosinophils Absolute: 0.1 10*3/uL (ref 0.0–0.5)
Eosinophils Relative: 4 %
HCT: 37.8 % — ABNORMAL LOW (ref 39.0–52.0)
Hemoglobin: 12.6 g/dL — ABNORMAL LOW (ref 13.0–17.0)
Immature Granulocytes: 0 %
Lymphocytes Relative: 58 %
Lymphs Abs: 1.9 10*3/uL (ref 0.7–4.0)
MCH: 32.7 pg (ref 26.0–34.0)
MCHC: 33.3 g/dL (ref 30.0–36.0)
MCV: 98.2 fL (ref 80.0–100.0)
Monocytes Absolute: 0.4 10*3/uL (ref 0.1–1.0)
Monocytes Relative: 12 %
Neutro Abs: 0.8 10*3/uL — ABNORMAL LOW (ref 1.7–7.7)
Neutrophils Relative %: 25 %
Platelet Count: 155 10*3/uL (ref 150–400)
RBC: 3.85 MIL/uL — ABNORMAL LOW (ref 4.22–5.81)
RDW: 13.5 % (ref 11.5–15.5)
WBC Count: 3.3 10*3/uL — ABNORMAL LOW (ref 4.0–10.5)
nRBC: 0 % (ref 0.0–0.2)

## 2022-01-17 NOTE — Progress Notes (Signed)
Per Dr. Benay Spice: hold treatment today due to [facility] of 0.8. Patient rescheduled to next week.

## 2022-01-19 ENCOUNTER — Encounter: Payer: Self-pay | Admitting: Nurse Practitioner

## 2022-01-19 ENCOUNTER — Encounter: Payer: Self-pay | Admitting: *Deleted

## 2022-01-19 ENCOUNTER — Ambulatory Visit: Payer: BC Managed Care – PPO

## 2022-01-19 ENCOUNTER — Inpatient Hospital Stay: Payer: BC Managed Care – PPO

## 2022-01-19 NOTE — Progress Notes (Signed)
PATIENT NAVIGATOR PROGRESS NOTE  Name: Dean [name] Date: 01/19/2022 MRN: [id number]  DOB: 1966/06/16   Reason for visit:  Phone call  Comments:  Spoke with Mr Kuk regarding taking MVI  Advised him that he should not take MVI during this treatment regimen.  Verbalized understanding    Time spent counseling/coordinating care: 15-30 minutes

## 2022-01-24 ENCOUNTER — Inpatient Hospital Stay: Payer: BC Managed Care – PPO

## 2022-01-24 ENCOUNTER — Encounter: Payer: Self-pay | Admitting: *Deleted

## 2022-01-24 ENCOUNTER — Other Ambulatory Visit: Payer: Self-pay

## 2022-01-24 ENCOUNTER — Encounter: Payer: Self-pay | Admitting: Oncology

## 2022-01-24 ENCOUNTER — Inpatient Hospital Stay (HOSPITAL_BASED_OUTPATIENT_CLINIC_OR_DEPARTMENT_OTHER): Payer: BC Managed Care – PPO | Admitting: Oncology

## 2022-01-24 VITALS — BP 153/85 | HR 58 | Temp 98.7°F | Resp 19 | Ht 71.0 in | Wt 166.4 lb

## 2022-01-24 DIAGNOSIS — C250 Malignant neoplasm of head of pancreas: Secondary | ICD-10-CM

## 2022-01-24 DIAGNOSIS — Z86718 Personal history of other venous thrombosis and embolism: Secondary | ICD-10-CM | POA: Diagnosis not present

## 2022-01-24 DIAGNOSIS — Z5189 Encounter for other specified aftercare: Secondary | ICD-10-CM | POA: Diagnosis not present

## 2022-01-24 DIAGNOSIS — K859 Acute pancreatitis without necrosis or infection, unspecified: Secondary | ICD-10-CM | POA: Diagnosis not present

## 2022-01-24 DIAGNOSIS — K831 Obstruction of bile duct: Secondary | ICD-10-CM | POA: Diagnosis not present

## 2022-01-24 DIAGNOSIS — F319 Bipolar disorder, unspecified: Secondary | ICD-10-CM | POA: Diagnosis not present

## 2022-01-24 DIAGNOSIS — Z7901 Long term (current) use of anticoagulants: Secondary | ICD-10-CM | POA: Diagnosis not present

## 2022-01-24 DIAGNOSIS — I4891 Unspecified atrial fibrillation: Secondary | ICD-10-CM | POA: Diagnosis not present

## 2022-01-24 DIAGNOSIS — D709 Neutropenia, unspecified: Secondary | ICD-10-CM | POA: Diagnosis not present

## 2022-01-24 LAB — CBC WITH DIFFERENTIAL (CANCER CENTER ONLY)
Abs Immature Granulocytes: 0 10*3/uL (ref 0.00–0.07)
Basophils Absolute: 0 10*3/uL (ref 0.0–0.1)
Basophils Relative: 1 %
Eosinophils Absolute: 0.2 10*3/uL (ref 0.0–0.5)
Eosinophils Relative: 4 %
HCT: 40.5 % (ref 39.0–52.0)
Hemoglobin: 13.4 g/dL (ref 13.0–17.0)
Immature Granulocytes: 0 %
Lymphocytes Relative: 46 %
Lymphs Abs: 2.2 10*3/uL (ref 0.7–4.0)
MCH: 32.8 pg (ref 26.0–34.0)
MCHC: 33.1 g/dL (ref 30.0–36.0)
MCV: 99 fL (ref 80.0–100.0)
Monocytes Absolute: 0.5 10*3/uL (ref 0.1–1.0)
Monocytes Relative: 10 %
Neutro Abs: 1.9 10*3/uL (ref 1.7–7.7)
Neutrophils Relative %: 39 %
Platelet Count: 155 10*3/uL (ref 150–400)
RBC: 4.09 MIL/uL — ABNORMAL LOW (ref 4.22–5.81)
RDW: 13.9 % (ref 11.5–15.5)
WBC Count: 4.8 10*3/uL (ref 4.0–10.5)
nRBC: 0 % (ref 0.0–0.2)

## 2022-01-24 LAB — CMP (CANCER CENTER ONLY)
ALT: 182 U/L — ABNORMAL HIGH (ref 0–44)
AST: 115 U/L — ABNORMAL HIGH (ref 15–41)
Albumin: 4.3 g/dL (ref 3.5–5.0)
Alkaline Phosphatase: 256 U/L — ABNORMAL HIGH (ref 38–126)
Anion gap: 8 (ref 5–15)
BUN: 18 mg/dL (ref 6–20)
CO2: 27 mmol/L (ref 22–32)
Calcium: 9.8 mg/dL (ref 8.9–10.3)
Chloride: 104 mmol/L (ref 98–111)
Creatinine: 0.73 mg/dL (ref 0.61–1.24)
GFR, Estimated: >60 mL/min (ref >60)
Glucose, Bld: 108 mg/dL — ABNORMAL HIGH (ref 70–99)
Potassium: 4.1 mmol/L (ref 3.5–5.1)
Sodium: 139 mmol/L (ref 135–145)
Total Bilirubin: 0.6 mg/dL (ref 0.3–1.2)
Total Protein: 6.7 g/dL (ref 6.5–8.1)

## 2022-01-24 MED ORDER — SODIUM CHLORIDE 0.9 % IV SOLN
10.0000 mg | Freq: Once | INTRAVENOUS | Status: AC
  Administered 2022-01-24: 10 mg via INTRAVENOUS
  Filled 2022-01-24: qty 1

## 2022-01-24 MED ORDER — ATROPINE SULFATE 1 MG/ML IV SOLN
0.5000 mg | Freq: Once | INTRAVENOUS | Status: AC | PRN
  Administered 2022-01-24: 0.5 mg via INTRAVENOUS
  Filled 2022-01-24: qty 1

## 2022-01-24 MED ORDER — DEXTROSE 5 % IV SOLN: Freq: Once | INTRAVENOUS | Status: AC

## 2022-01-24 MED ORDER — OXALIPLATIN CHEMO INJECTION 100 MG/20ML
65.0000 mg/m2 | Freq: Once | INTRAVENOUS | Status: AC
  Administered 2022-01-24: 125 mg via INTRAVENOUS
  Filled 2022-01-24: qty 20

## 2022-01-24 MED ORDER — PALONOSETRON HCL INJECTION 0.25 MG/5ML
0.2500 mg | Freq: Once | INTRAVENOUS | Status: AC
  Administered 2022-01-24: 0.25 mg via INTRAVENOUS
  Filled 2022-01-24: qty 5

## 2022-01-24 MED ORDER — SODIUM CHLORIDE 0.9 % IV SOLN
120.0000 mg/m2 | Freq: Once | INTRAVENOUS | Status: AC
  Administered 2022-01-24: 240 mg via INTRAVENOUS
  Filled 2022-01-24: qty 10

## 2022-01-24 MED ORDER — SODIUM CHLORIDE 0.9 % IV SOLN
150.0000 mg | Freq: Once | INTRAVENOUS | Status: AC
  Administered 2022-01-24: 150 mg via INTRAVENOUS
  Filled 2022-01-24: qty 5

## 2022-01-24 MED ORDER — SODIUM CHLORIDE 0.9 % IV SOLN
2000.0000 mg/m2 | INTRAVENOUS | Status: DC
  Administered 2022-01-24: 3850 mg via INTRAVENOUS
  Filled 2022-01-24: qty 77

## 2022-01-24 MED ORDER — SODIUM CHLORIDE 0.9 % IV SOLN
400.0000 mg/m2 | Freq: Once | INTRAVENOUS | Status: AC
  Administered 2022-01-24: 772 mg via INTRAVENOUS
  Filled 2022-01-24: qty 38.6

## 2022-01-24 NOTE — Progress Notes (Signed)
PATIENT NAVIGATOR PROGRESS NOTE  Name: Dean [name] Date: 01/24/2022 MRN: [id number]  DOB: 06-Dec-1965   Reason for visit:  Office note sent to [facility] for diability  Comments:  Faxed required paperwork to [facility] at [phone]    Time spent counseling/coordinating care: 15-30 minutes

## 2022-01-24 NOTE — Patient Instructions (Signed)
[facility]  Discharge Instructions: Thank you for choosing [facility] to provide your oncology and hematology care.   If you have a lab appointment with the [facility], please go directly to the [facility] and check in at the registration area.   Wear comfortable clothing and clothing appropriate for easy access to any Portacath or PICC line.   We strive to give you quality time with your provider. You may need to reschedule your appointment if you arrive late (15 or more minutes).  Arriving late affects you and other patients whose appointments are after yours.  Also, if you miss three or more appointments without notifying the office, you may be dismissed from the clinic at the providers discretion.      For prescription refill requests, have your pharmacy contact our office and allow 72 hours for refills to be completed.    Today you received the following chemotherapy and/or immunotherapy agents oxaliplatin, irinotecan, leucovorin, fluorouracil.   Oxaliplatin Injection What is this medication? OXALIPLATIN (ox AL i PLA tin) is a chemotherapy drug. It targets fast dividing cells, like cancer cells, and causes these cells to die. This medicine is used to treat cancers of the colon and rectum, and many other cancers. This medicine may be used for other purposes; ask your health care provider or pharmacist if you have questions. COMMON BRAND NAME(S): Eloxatin What should I tell my care team before I take this medication? They need to know if you have any of these conditions: heart disease history of irregular heartbeat liver disease low blood counts, like white cells, platelets, or red blood cells lung or breathing disease, like asthma take medicines that treat or prevent blood clots tingling of the fingers or toes, or other nerve disorder an unusual or allergic reaction to oxaliplatin, other chemotherapy, other medicines, foods, dyes, or  preservatives pregnant or trying to get pregnant breast-feeding How should I use this medication? This drug is given as an infusion into a vein. It is administered in a hospital or clinic by a specially trained health care professional. Talk to your pediatrician regarding the use of this medicine in children. Special care may be needed. Overdosage: If you think you have taken too much of this medicine contact a poison control center or emergency room at once. NOTE: This medicine is only for you. Do not share this medicine with others. What if I miss a dose? It is important not to miss a dose. Call your doctor or health care professional if you are unable to keep an appointment. What may interact with this medication? Do not take this medicine with any of the following medications: cisapride dronedarone pimozide thioridazine This medicine may also interact with the following medications: aspirin and aspirin-like medicines certain medicines that treat or prevent blood clots like warfarin, apixaban, dabigatran, and rivaroxaban cisplatin cyclosporine diuretics medicines for infection like acyclovir, adefovir, amphotericin B, bacitracin, cidofovir, foscarnet, ganciclovir, gentamicin, pentamidine, vancomycin NSAIDs, medicines for pain and inflammation, like ibuprofen or naproxen other medicines that prolong the QT interval (an abnormal heart rhythm) pamidronate zoledronic acid This list may not describe all possible interactions. Give your health care provider a list of all the medicines, herbs, non-prescription drugs, or dietary supplements you use. Also tell them if you smoke, drink alcohol, or use illegal drugs. Some items may interact with your medicine. What should I watch for while using this medication? Your condition will be monitored carefully while you are receiving this  medicine. You may need blood work done while you are taking this medicine. This medicine may make you feel  generally unwell. This is not uncommon as chemotherapy can affect healthy cells as well as cancer cells. Report any side effects. Continue your course of treatment even though you feel ill unless your healthcare professional tells you to stop. This medicine can make you more sensitive to cold. Do not drink cold drinks or use ice. Cover exposed skin before coming in contact with cold temperatures or cold objects. When out in cold weather wear warm clothing and cover your mouth and nose to warm the air that goes into your lungs. Tell your doctor if you get sensitive to the cold. Do not become pregnant while taking this medicine or for 9 months after stopping it. Women should inform their health care professional if they wish to become pregnant or think they might be pregnant. Men should not father a child while taking this medicine and for 6 months after stopping it. There is potential for serious side effects to an unborn child. Talk to your health care professional for more information. Do not breast-feed a child while taking this medicine or for 3 months after stopping it. This medicine has caused ovarian failure in some women. This medicine may make it more difficult to get pregnant. Talk to your health care professional if you are concerned about your fertility. This medicine has caused decreased sperm counts in some men. This may make it more difficult to father a child. Talk to your health care professional if you are concerned about your fertility. This medicine may increase your risk of getting an infection. Call your health care professional for advice if you get a fever, chills, or sore throat, or other symptoms of a cold or flu. Do not treat yourself. Try to avoid being around people who are sick. Avoid taking medicines that contain aspirin, acetaminophen, ibuprofen, naproxen, or ketoprofen unless instructed by your health care professional. These medicines may hide a fever. Be careful brushing or  flossing your teeth or using a toothpick because you may get an infection or bleed more easily. If you have any dental work done, tell your dentist you are receiving this medicine. What side effects may I notice from receiving this medication? Side effects that you should report to your doctor or health care professional as soon as possible: allergic reactions like skin rash, itching or hives, swelling of the face, lips, or tongue breathing problems cough low blood counts - this medicine may decrease the number of white blood cells, red blood cells, and platelets. You may be at increased risk for infections and bleeding nausea, vomiting pain, redness, or irritation at site where injected pain, tingling, numbness in the hands or feet signs and symptoms of bleeding such as bloody or black, tarry stools; red or dark brown urine; spitting up blood or brown material that looks like coffee grounds; red spots on the skin; unusual bruising or bleeding from the eyes, gums, or nose signs and symptoms of a dangerous change in heartbeat or heart rhythm like chest pain; dizziness; fast, irregular heartbeat; palpitations; feeling faint or lightheaded; falls signs and symptoms of infection like fever; chills; cough; sore throat; pain or trouble passing urine signs and symptoms of liver injury like dark yellow or brown urine; general ill feeling or flu-like symptoms; light-colored stools; loss of appetite; nausea; right upper belly pain; unusually weak or tired; yellowing of the eyes or skin signs and symptoms of  low red blood cells or anemia such as unusually weak or tired; feeling faint or lightheaded; falls signs and symptoms of muscle injury like dark urine; trouble passing urine or change in the amount of urine; unusually weak or tired; muscle pain; back pain Side effects that usually do not require medical attention (report to your doctor or health care professional if they continue or are  bothersome): changes in taste diarrhea gas hair loss loss of appetite mouth sores This list may not describe all possible side effects. Call your doctor for medical advice about side effects. You may report side effects to FDA at 1-800-FDA-1088. Where should I keep my medication? This drug is given in a hospital or clinic and will not be stored at home. NOTE: This sheet is a summary. It may not cover all possible information. If you have questions about this medicine, talk to your doctor, pharmacist, or health care provider.  2022 [employer] Standard (2021-08-10 00:00:00)      Irinotecan injection What is this medication? IRINOTECAN (ir in oh TEE kan ) is a chemotherapy drug. It is used to treat colon and rectal cancer. This medicine may be used for other purposes; ask your health care provider or pharmacist if you have questions. COMMON BRAND NAME(S): Camptosar What should I tell my care team before I take this medication? They need to know if you have any of these conditions: dehydration diarrhea infection (especially a virus infection such as chickenpox, cold sores, or herpes) liver disease low blood counts, like low white cell, platelet, or red cell counts low levels of calcium, magnesium, or potassium in the blood recent or ongoing radiation therapy an unusual or allergic reaction to irinotecan, other medicines, foods, dyes, or preservatives pregnant or trying to get pregnant breast-feeding How should I use this medication? This drug is given as an infusion into a vein. It is administered in a hospital or clinic by a specially trained health care professional. Talk to your pediatrician regarding the use of this medicine in children. Special care may be needed. Overdosage: If you think you have taken too much of this medicine contact a poison control center or emergency room at once. NOTE: This medicine is only for you. Do not share this medicine with others. What if I miss  a dose? It is important not to miss your dose. Call your doctor or health care professional if you are unable to keep an appointment. What may interact with this medication? Do not take this medicine with any of the following medications: cobicistat itraconazole This medicine may interact with the following medications: antiviral medicines for HIV or AIDS certain antibiotics like rifampin or rifabutin certain medicines for fungal infections like ketoconazole, posaconazole, and voriconazole certain medicines for seizures like carbamazepine, phenobarbital, phenotoin clarithromycin gemfibrozil nefazodone [facility]. John's Wort This list may not describe all possible interactions. Give your health care provider a list of all the medicines, herbs, non-prescription drugs, or dietary supplements you use. Also tell them if you smoke, drink alcohol, or use illegal drugs. Some items may interact with your medicine. What should I watch for while using this medication? Your condition will be monitored carefully while you are receiving this medicine. You will need important blood work done while you are taking this medicine. This drug may make you feel generally unwell. This is not uncommon, as chemotherapy can affect healthy cells as well as cancer cells. Report any side effects. Continue your course of treatment even though you feel ill unless your  doctor tells you to stop. In some cases, you may be given additional medicines to help with side effects. Follow all directions for their use. You may get drowsy or dizzy. Do not drive, use machinery, or do anything that needs mental alertness until you know how this medicine affects you. Do not stand or sit up quickly, especially if you are an older patient. This reduces the risk of dizzy or fainting spells. Call your health care professional for advice if you get a fever, chills, or sore throat, or other symptoms of a cold or flu. Do not treat yourself. This medicine  decreases your body's ability to fight infections. Try to avoid being around people who are sick. Avoid taking products that contain aspirin, acetaminophen, ibuprofen, naproxen, or ketoprofen unless instructed by your doctor. These medicines may hide a fever. This medicine may increase your risk to bruise or bleed. Call your doctor or health care professional if you notice any unusual bleeding. Be careful brushing and flossing your teeth or using a toothpick because you may get an infection or bleed more easily. If you have any dental work done, tell your dentist you are receiving this medicine. Do not become pregnant while taking this medicine or for 6 months after stopping it. Women should inform their health care professional if they wish to become pregnant or think they might be pregnant. Men should not father a child while taking this medicine and for 3 months after stopping it. There is potential for serious side effects to an unborn child. Talk to your health care professional for more information. Do not breast-feed an infant while taking this medicine or for 7 days after stopping it. This medicine has caused ovarian failure in some women. This medicine may make it more difficult to get pregnant. Talk to your health care professional if you are concerned about your fertility. This medicine has caused decreased sperm counts in some men. This may make it more difficult to father a child. Talk to your health care professional if you are concerned about your fertility. What side effects may I notice from receiving this medication? Side effects that you should report to your doctor or health care professional as soon as possible: allergic reactions like skin rash, itching or hives, swelling of the face, lips, or tongue chest pain diarrhea flushing, runny nose, sweating during infusion low blood counts - this medicine may decrease the number of white blood cells, red blood cells and platelets. You  may be at increased risk for infections and bleeding. nausea, vomiting pain, swelling, warmth in the leg signs of decreased platelets or bleeding - bruising, pinpoint red spots on the skin, black, tarry stools, blood in the urine signs of infection - fever or chills, cough, sore throat, pain or difficulty passing urine signs of decreased red blood cells - unusually weak or tired, fainting spells, lightheadedness Side effects that usually do not require medical attention (report to your doctor or health care professional if they continue or are bothersome): constipation hair loss headache loss of appetite mouth sores stomach pain This list may not describe all possible side effects. Call your doctor for medical advice about side effects. You may report side effects to FDA at 1-800-FDA-1088. Where should I keep my medication? This drug is given in a hospital or clinic and will not be stored at home. NOTE: This sheet is a summary. It may not cover all possible information. If you have questions about this medicine, talk to your doctor,  pharmacist, or health care provider.  2022 [employer]/Gold Standard (2021-08-10 00:00:00)  Leucovorin injection What is this medication? LEUCOVORIN (loo koe VOR in) is used to prevent or treat the harmful effects of some medicines. This medicine is used to treat anemia caused by a low amount of folic acid in the body. It is also used with 5-fluorouracil (5-FU) to treat colon cancer. This medicine may be used for other purposes; ask your health care provider or pharmacist if you have questions. What should I tell my care team before I take this medication? They need to know if you have any of these conditions: anemia from low levels of vitamin B-12 in the blood an unusual or allergic reaction to leucovorin, folic acid, other medicines, foods, dyes, or preservatives pregnant or trying to get pregnant breast-feeding How should I use this medication? This  medicine is for injection into a muscle or into a vein. It is given by a health care professional in a hospital or clinic setting. Talk to your pediatrician regarding the use of this medicine in children. Special care may be needed. Overdosage: If you think you have taken too much of this medicine contact a poison control center or emergency room at once. NOTE: This medicine is only for you. Do not share this medicine with others. What if I miss a dose? This does not apply. What may interact with this medication? capecitabine fluorouracil phenobarbital phenytoin primidone trimethoprim-sulfamethoxazole This list may not describe all possible interactions. Give your health care provider a list of all the medicines, herbs, non-prescription drugs, or dietary supplements you use. Also tell them if you smoke, drink alcohol, or use illegal drugs. Some items may interact with your medicine. What should I watch for while using this medication? Your condition will be monitored carefully while you are receiving this medicine. This medicine may increase the side effects of 5-fluorouracil, 5-FU. Tell your doctor or health care professional if you have diarrhea or mouth sores that do not get better or that get worse. What side effects may I notice from receiving this medication? Side effects that you should report to your doctor or health care professional as soon as possible: allergic reactions like skin rash, itching or hives, swelling of the face, lips, or tongue breathing problems fever, infection mouth sores unusual bleeding or bruising unusually weak or tired Side effects that usually do not require medical attention (report to your doctor or health care professional if they continue or are bothersome): constipation or diarrhea loss of appetite nausea, vomiting This list may not describe all possible side effects. Call your doctor for medical advice about side effects. You may report side  effects to FDA at 1-800-FDA-1088. Where should I keep my medication? This drug is given in a hospital or clinic and will not be stored at home. NOTE: This sheet is a summary. It may not cover all possible information. If you have questions about this medicine, talk to your doctor, pharmacist, or health care provider.  2022 [employer]/Gold Standard (2008-05-29 00:00:00)  Fluorouracil, 5-FU injection What is this medication? FLUOROURACIL, 5-FU (flure oh YOOR a sil) is a chemotherapy drug. It slows the growth of cancer cells. This medicine is used to treat many types of cancer like breast cancer, colon or rectal cancer, pancreatic cancer, and stomach cancer. This medicine may be used for other purposes; ask your health care provider or pharmacist if you have questions. COMMON BRAND NAME(S): Adrucil What should I tell my care team before I take this  medication? They need to know if you have any of these conditions: blood disorders dihydropyrimidine dehydrogenase (DPD) deficiency infection (especially a virus infection such as chickenpox, cold sores, or herpes) kidney disease liver disease malnourished, poor nutrition recent or ongoing radiation therapy an unusual or allergic reaction to fluorouracil, other chemotherapy, other medicines, foods, dyes, or preservatives pregnant or trying to get pregnant breast-feeding How should I use this medication? This drug is given as an infusion or injection into a vein. It is administered in a hospital or clinic by a specially trained health care professional. Talk to your pediatrician regarding the use of this medicine in children. Special care may be needed. Overdosage: If you think you have taken too much of this medicine contact a poison control center or emergency room at once. NOTE: This medicine is only for you. Do not share this medicine with others. What if I miss a dose? It is important not to miss your dose. Call your doctor or health care  professional if you are unable to keep an appointment. What may interact with this medication? Do not take this medicine with any of the following medications: live virus vaccines This medicine may also interact with the following medications: medicines that treat or prevent blood clots like warfarin, enoxaparin, and dalteparin This list may not describe all possible interactions. Give your health care provider a list of all the medicines, herbs, non-prescription drugs, or dietary supplements you use. Also tell them if you smoke, drink alcohol, or use illegal drugs. Some items may interact with your medicine. What should I watch for while using this medication? Visit your doctor for checks on your progress. This drug may make you feel generally unwell. This is not uncommon, as chemotherapy can affect healthy cells as well as cancer cells. Report any side effects. Continue your course of treatment even though you feel ill unless your doctor tells you to stop. In some cases, you may be given additional medicines to help with side effects. Follow all directions for their use. Call your doctor or health care professional for advice if you get a fever, chills or sore throat, or other symptoms of a cold or flu. Do not treat yourself. This drug decreases your body's ability to fight infections. Try to avoid being around people who are sick. This medicine may increase your risk to bruise or bleed. Call your doctor or health care professional if you notice any unusual bleeding. Be careful brushing and flossing your teeth or using a toothpick because you may get an infection or bleed more easily. If you have any dental work done, tell your dentist you are receiving this medicine. Avoid taking products that contain aspirin, acetaminophen, ibuprofen, naproxen, or ketoprofen unless instructed by your doctor. These medicines may hide a fever. Do not become pregnant while taking this medicine. Women should inform  their doctor if they wish to become pregnant or think they might be pregnant. There is a potential for serious side effects to an unborn child. Talk to your health care professional or pharmacist for more information. Do not breast-feed an infant while taking this medicine. Men should inform their doctor if they wish to father a child. This medicine may lower sperm counts. Do not treat diarrhea with over the counter products. Contact your doctor if you have diarrhea that lasts more than 2 days or if it is severe and watery. This medicine can make you more sensitive to the sun. Keep out of the sun. If you cannot  avoid being in the sun, wear protective clothing and use sunscreen. Do not use sun lamps or tanning beds/booths. What side effects may I notice from receiving this medication? Side effects that you should report to your doctor or health care professional as soon as possible: allergic reactions like skin rash, itching or hives, swelling of the face, lips, or tongue low blood counts - this medicine may decrease the number of white blood cells, red blood cells and platelets. You may be at increased risk for infections and bleeding. signs of infection - fever or chills, cough, sore throat, pain or difficulty passing urine signs of decreased platelets or bleeding - bruising, pinpoint red spots on the skin, black, tarry stools, blood in the urine signs of decreased red blood cells - unusually weak or tired, fainting spells, lightheadedness breathing problems changes in vision chest pain mouth sores nausea and vomiting pain, swelling, redness at site where injected pain, tingling, numbness in the hands or feet redness, swelling, or sores on hands or feet stomach pain unusual bleeding Side effects that usually do not require medical attention (report to your doctor or health care professional if they continue or are bothersome): changes in finger or toe nails diarrhea dry or itchy skin hair  loss headache loss of appetite sensitivity of eyes to the light stomach upset unusually teary eyes This list may not describe all possible side effects. Call your doctor for medical advice about side effects. You may report side effects to FDA at 1-800-FDA-1088. Where should I keep my medication? This drug is given in a hospital or clinic and will not be stored at home. NOTE: This sheet is a summary. It may not cover all possible information. If you have questions about this medicine, talk to your doctor, pharmacist, or health care provider.  2022 [employer] Standard (2021-08-10 00:00:00)  To help prevent nausea and vomiting after your treatment, we encourage you to take your nausea medication as directed.  BELOW ARE SYMPTOMS THAT SHOULD BE REPORTED IMMEDIATELY: *FEVER GREATER THAN 100.4 F (38 C) OR HIGHER *CHILLS OR SWEATING *NAUSEA AND VOMITING THAT IS NOT CONTROLLED WITH YOUR NAUSEA MEDICATION *UNUSUAL SHORTNESS OF BREATH *UNUSUAL BRUISING OR BLEEDING *URINARY PROBLEMS (pain or burning when urinating, or frequent urination) *BOWEL PROBLEMS (unusual diarrhea, constipation, pain near the anus) TENDERNESS IN MOUTH AND THROAT WITH OR WITHOUT PRESENCE OF ULCERS (sore throat, sores in mouth, or a toothache) UNUSUAL RASH, SWELLING OR PAIN  UNUSUAL VAGINAL DISCHARGE OR ITCHING   Items with * indicate a potential emergency and should be followed up as soon as possible or go to the Emergency Department if any problems should occur.  Please show the CHEMOTHERAPY ALERT CARD or IMMUNOTHERAPY ALERT CARD at check-in to the Emergency Department and triage nurse.  Should you have questions after your visit or need to cancel or reschedule your appointment, please contact [facility]  Dept: [phone]  and follow the prompts.  Office hours are 8:00 a.m. to 4:30 p.m. Monday - Friday. Please note that voicemails left after 4:00 p.m. may not be returned until the  following business day.  We are closed weekends and major holidays. You have access to a nurse at all times for urgent questions. Please call the main number to the clinic Dept: [phone] and follow the prompts.   For any non-urgent questions, you may also contact your provider using MyChart. We now offer e-Visits for anyone 16 and older to request care online for non-urgent symptoms. For  details visit [email or web address].   Also download the MyChart app! Go to the app store, search "MyChart", open the app, select [facility], and log in with your MyChart username and password.  Due to Covid, a mask is required upon entering the hospital/clinic. If you do not have a mask, one will be given to you upon arrival. For doctor visits, patients may have 1 support person aged 41 or older with them. For treatment visits, patients cannot have anyone with them due to current Covid guidelines and our immunocompromised population.   The chemotherapy medication bag should finish at 46 hours, 96 hours, or 7 days. For example, if your pump is scheduled for 46 hours and it was put on at 4:00 p.m., it should finish at 2:00 p.m. the day it is scheduled to come off regardless of your appointment time.     Estimated time to finish at 12:15pm on 01/26/22.   If the display on your pump reads "Low Volume" and it is beeping, take the batteries out of the pump and come to the cancer center for it to be taken off.   If the pump alarms go off prior to the pump reading "Low Volume" then call [phone] and someone can assist you.  If the plunger comes out and the chemotherapy medication is leaking out, please use your home chemo spill kit to clean up the spill. Do NOT use paper towels or other household products.  If you have problems or questions regarding your pump, please call either [phone] (24 hours a day) or the cancer center Monday-Friday 8:00 a.m.- 4:30 p.m. at the clinic number and we will assist you.  If you are unable to get assistance, then go to the nearest Emergency Department and ask the staff to contact the IV team for assistance.

## 2022-01-24 NOTE — Progress Notes (Addendum)
°  [facility] OFFICE PROGRESS NOTE   Diagnosis: Pancreas cancer  INTERVAL HISTORY:   Mr. Memmott returns as scheduled.  He feels well.  No complaint.  No neuropathy symptoms.  Objective:  Vital signs in last 24 hours:  Blood pressure (!) 153/85, pulse (!) 58, temperature 98.7 F (37.1 C), temperature source Oral, resp. rate 19, height 5\' 11"  (1.803 m), weight 166 lb 6.4 oz (75.5 kg), SpO2 100 %.    HEENT: No thrush or ulcers Resp: Lungs clear bilaterally Cardio: Regular rate and rhythm GI: Nontender, no hepatosplenomegaly Vascular: No leg edema   Portacath/PICC-without erythema  Lab Results:  Lab Results  Component Value Date   WBC 3.3 (L) 01/17/2022   HGB 12.6 (L) 01/17/2022   HCT 37.8 (L) 01/17/2022   MCV 98.2 01/17/2022   PLT 155 01/17/2022   NEUTROABS 0.8 (L) 01/17/2022    CMP  Lab Results  Component Value Date   NA 139 01/17/2022   K 4.0 01/17/2022   CL 105 01/17/2022   CO2 26 01/17/2022   GLUCOSE 99 01/17/2022   BUN 18 01/17/2022   CREATININE 0.67 01/17/2022   CALCIUM 9.3 01/17/2022   PROT 6.5 01/17/2022   ALBUMIN 4.0 01/17/2022   AST 86 (H) 01/17/2022   ALT 149 (H) 01/17/2022   ALKPHOS 241 (H) 01/17/2022   BILITOT 0.4 01/17/2022   GFRNONAA >60 01/17/2022   GFRAA >60 08/19/2015    Lab Results  Component Value Date   BOF751 85 (H) 10/07/2021     Medications: I have reviewed the patient's current medications.   Assessment/Plan: Pancreas cancer, status post a pancreaticoduodenectomy 11/12/2021, stage IIb (pT1,pN1) 1/11 lymph nodes, perineural invasion positive, positive margin at the SMA and SMV MRCP 09/27/2021-diffuse intrahepatic and common bile duct dilatation with abrupt termination at the level of the head of the pancreas, indistinct area of hypoenhancement in the pancreas head ERCP with placement of pancreatic duct and bile duct stents 10/04/2021 EUS 1 10/04/2021-no pancreas mass identified, FNA of pancreas  head-"atypical "cells, no adenopathy CT abdomen/pelvis 10/18/2021-no focal liver abnormality, intra and extrahepatic biliary ductal dilatation with narrowing of the common duct and the pancreas head, no enlarged abdominal lymph nodes Cycle 1 FOLFIRINOX 12/27/2021, irinotecan held with cycle 1 Cycle 2 FOLFIRINOX 01/24/2022, oxaliplatin and irinotecan dose reduced secondary to elevated liver enzymes Acute pancreatitis 10/04/2021 Bipolar disorder Left soleal DVT 11/26/2021-apixaban Port-A-Cath placement 12/22/2021  Atrial fibrillation 12/22/2021, converted to sinus rhythm, discharged home 12/23/2021 on metoprolol 12.5 mg twice daily, on Eliquis for prior DVT. Neutropenia secondary to chemotherapy 01/10/2022, Udenyca added with cycle 2 Elevated liver enzymes, predating chemotherapy   Disposition: Mr Anastas appears well.  He has completed 1 cycle of chemotherapy.  Cycle 2 has been delayed secondary to neutropenia.  The neutrophil count has recovered.  We will complete cycle 2 chemotherapy today.  He will receive G-CSF support.  The liver enzymes are elevated.  This predated chemotherapy.  This could be related to polypharmacy.  I am concerned the liver enzymes will become more elevated with chemotherapy.  I will dose reduce the irinotecan and oxaliplatin with this cycle.  Mr Baugher will return for an office visit and chemotherapy in 2 weeks.  He will need to remain out of work until at least 04/04/2022.  Betsy Coder, MD  01/24/2022  8:20 AM

## 2022-01-24 NOTE — Patient Instructions (Signed)

## 2022-01-24 NOTE — Progress Notes (Signed)
Dr. Benay Spice dose reducing 58fu as well.

## 2022-01-24 NOTE — Progress Notes (Signed)
Patient presents for treatment. RN assessment completed along with the following:  Labs/vitals reviewed - Yes, and Per Dr. Benay Spice, ok to to treat with ALT 182, and AST of 115.     Weight within 10% of previous measurement - Yes Informed consent completed and reflects current therapy/intent - Yes, on date 12/27/21             Provider progress note reviewed - Yes, today's provider note was reviewed. Treatment/Antibody/Supportive plan reviewed - Yes, and oxaliplatin and irinotecan will be dose reduced.  S&H and other orders reviewed - Yes, and there are no additional orders identified. Previous treatment date reviewed - Yes, and the appropriate amount of time has elapsed between treatments. Clinic Hand Off Received from - Lupita Raider, RN  Patient to proceed with treatment.

## 2022-01-24 NOTE — Progress Notes (Signed)
Patient seen by Dr. Benay Spice today  Vitals are within treatment parameters.  Labs reviewed by Dr. Benay Spice CBC reviewed and WNL. CMP - AST/ALT above normal levels.   Per physician team, Dr. Benay Spice to dose reduce Irinotecan & Oxaliplatin by 20% based on AST/ALT levels. Dean [name] has been added to future tx plans. Udenyca authorized.

## 2022-01-26 ENCOUNTER — Ambulatory Visit (INDEPENDENT_AMBULATORY_CARE_PROVIDER_SITE_OTHER): Payer: BC Managed Care – PPO | Admitting: Cardiovascular Disease

## 2022-01-26 ENCOUNTER — Encounter: Payer: Self-pay | Admitting: Cardiovascular Disease

## 2022-01-26 ENCOUNTER — Other Ambulatory Visit: Payer: Self-pay

## 2022-01-26 ENCOUNTER — Inpatient Hospital Stay: Payer: BC Managed Care – PPO

## 2022-01-26 VITALS — BP 117/67 | HR 60 | Temp 98.5°F | Resp 18

## 2022-01-26 VITALS — BP 116/72 | HR 50 | Ht 71.0 in | Wt 166.2 lb

## 2022-01-26 DIAGNOSIS — I480 Paroxysmal atrial fibrillation: Secondary | ICD-10-CM

## 2022-01-26 DIAGNOSIS — Z7901 Long term (current) use of anticoagulants: Secondary | ICD-10-CM | POA: Diagnosis not present

## 2022-01-26 DIAGNOSIS — Z5189 Encounter for other specified aftercare: Secondary | ICD-10-CM | POA: Diagnosis not present

## 2022-01-26 DIAGNOSIS — K859 Acute pancreatitis without necrosis or infection, unspecified: Secondary | ICD-10-CM | POA: Diagnosis not present

## 2022-01-26 DIAGNOSIS — Z86718 Personal history of other venous thrombosis and embolism: Secondary | ICD-10-CM | POA: Diagnosis not present

## 2022-01-26 DIAGNOSIS — F319 Bipolar disorder, unspecified: Secondary | ICD-10-CM | POA: Diagnosis not present

## 2022-01-26 DIAGNOSIS — C250 Malignant neoplasm of head of pancreas: Secondary | ICD-10-CM | POA: Diagnosis not present

## 2022-01-26 DIAGNOSIS — D709 Neutropenia, unspecified: Secondary | ICD-10-CM | POA: Diagnosis not present

## 2022-01-26 DIAGNOSIS — I4891 Unspecified atrial fibrillation: Secondary | ICD-10-CM | POA: Diagnosis not present

## 2022-01-26 MED ORDER — SODIUM CHLORIDE 0.9% FLUSH
10.0000 mL | INTRAVENOUS | Status: DC | PRN
  Administered 2022-01-26: 10 mL

## 2022-01-26 MED ORDER — HEPARIN SOD (PORK) LOCK FLUSH 100 UNIT/ML IV SOLN
500.0000 [IU] | Freq: Once | INTRAVENOUS | Status: AC | PRN
  Administered 2022-01-26: 500 [IU]

## 2022-01-26 MED ORDER — PEGFILGRASTIM-CBQV 6 MG/0.6ML ~~LOC~~ SOSY
6.0000 mg | PREFILLED_SYRINGE | Freq: Once | SUBCUTANEOUS | Status: AC
  Administered 2022-01-26: 6 mg via SUBCUTANEOUS
  Filled 2022-01-26: qty 0.6

## 2022-01-26 NOTE — Progress Notes (Signed)
01/26/2022 Carden Teel Frazer   10-14-1966  [id number]  Primary Physician Scifres, Earlie Server, PA-C Primary Cardiologist: Lorretta Harp MD FACP, [place], [place], [place]  HPI:  Dean [name] is a 56 y.o. thin-appearing Caucasian male with bipolar disease, GERD, thyroid disease and pancreatic cancer status post Whipple procedure 11/12/2021.  He had also developed a DVT on Eliquis at that time.  He was having a Port-A-Cath placed and developed A-fib with RVR post procedure on 12/22/2021 which quickly resolved with treatment with IV diltiazem.  He has had no recurrence.   Current Meds  Medication Sig   apixaban (ELIQUIS) 5 MG TABS tablet Take 1 tablet (5 mg total) by mouth 2 (two) times daily. Start after starter pack completed   divalproex (DEPAKOTE ER) 500 MG 24 hr tablet TAKE 5 TABLETS (2,500 MG TOTAL) BY MOUTH DAILY. (Patient taking differently: Take 2,000 mg by mouth at bedtime.)   levothyroxine (SYNTHROID) 137 MCG tablet Take 137 mcg by mouth daily before breakfast.   Lidocaine-Prilocaine (EMLA EX)    lidocaine-prilocaine (EMLA) cream Apply 1 application topically as needed. Apply 1/2 tablespoon to port site and cover with Press-and-Seal 2 hours prior to access to numb site. Do not start until 14 days after port is inserted.   pantoprazole (PROTONIX) 40 MG tablet Take 40 mg by mouth daily.     Allergies  Allergen Reactions   Erythromycin Nausea Only   Ibuprofen Other (See Comments)    Peptic Ulcer nsaids   Nsaids Other (See Comments)    Social History   Socioeconomic History   Marital status: Married    Spouse name: Not on file   Number of children: Not on file   Years of education: Not on file   Highest education level: Not on file  Occupational History   Not on file  Tobacco Use   Smoking status: Former    Types: Cigarettes    Quit date: 2012    Years since quitting: 11.1   Smokeless tobacco: Never  Vaping Use   Vaping Use: Never used  Substance and Sexual  Activity   Alcohol use: Not Currently   Drug use: No   Sexual activity: Not on file  Other Topics Concern   Not on file  Social History Narrative   Not on file   Social Determinants of Health   Financial Resource Strain: Not on file  Food Insecurity: Not on file  Transportation Needs: Not on file  Physical Activity: Not on file  Stress: Not on file  Social Connections: Not on file  Intimate Partner Violence: Not on file     Review of Systems: General: negative for chills, fever, night sweats or weight changes.  Cardiovascular: negative for chest pain, dyspnea on exertion, edema, orthopnea, palpitations, paroxysmal nocturnal dyspnea or shortness of breath Dermatological: negative for rash Respiratory: negative for cough or wheezing Urologic: negative for hematuria Abdominal: negative for nausea, vomiting, diarrhea, bright red blood per rectum, melena, or hematemesis Neurologic: negative for visual changes, syncope, or dizziness All other systems reviewed and are otherwise negative except as noted above.    Blood pressure 116/72, pulse (!) 50, height 5\' 11"  (1.803 m), weight 166 lb 3.2 oz (75.4 kg), SpO2 96 %.  General appearance: alert and no distress Neck: no adenopathy, no carotid bruit, no JVD, supple, symmetrical, trachea midline, and thyroid not enlarged, symmetric, no tenderness/mass/nodules Lungs: clear to auscultation bilaterally Heart: regular rate and rhythm, S1, S2 normal, no murmur, click, rub or  gallop Extremities: extremities normal, atraumatic, no cyanosis or edema Pulses: 2+ and symmetric Skin: Skin color, texture, turgor normal. No rashes or lesions Neurologic: Grossly normal  EKG sinus bradycardia 50 without ST or T wave changes.  Personally reviewed this EKG.  ASSESSMENT AND PLAN:   Atrial fibrillation [facility]) Mr. Mayeda was seen in PACU postprocedure from placement of a Port-A-Cath 12/22/2021.  He had been on Eliquis for DVT which she held for  several days and has restarted.  He converted to sinus rhythm with IV diltiazem.  He has not had any recurrence.  No further work-up is necessary at this time.  He remains in sinus rhythm today.       Lorretta Harp MD FACP,FACC,FAHA, [facility] 01/26/2022 9:57 AM

## 2022-01-26 NOTE — Patient Instructions (Signed)
Medication Instructions:  Your physician recommends that you continue on your current medications as directed. Please refer to the Current Medication list given to you today.  *If you need a refill on your cardiac medications before your next appointment, please call your pharmacy*  Lab Work: NONE ordered at this time of appointment   If you have labs (blood work) drawn today and your tests are completely normal, you will receive your results only by: [facility] (if you have MyChart) OR A paper copy in the mail If you have any lab test that is abnormal or we need to change your treatment, we will call you to review the results.  Testing/Procedures: NONE ordered at this time of appointment   Follow-Up: At [facility], you and your health needs are our priority.  As part of our continuing mission to provide you with exceptional heart care, we have created designated Provider Care Teams.  These Care Teams include your primary Cardiologist (physician) and Advanced Practice Providers (APPs -  Physician Assistants and Nurse Practitioners) who all work together to provide you with the care you need, when you need it.  Your next appointment:   As Needed   The format for your next appointment:   In Person  Provider:   Quay Burow, MD    Other Instructions

## 2022-01-26 NOTE — Assessment & Plan Note (Signed)
Dean [name] was seen in PACU postprocedure from placement of a Port-A-Cath 12/22/2021.  He had been on Eliquis for DVT which she held for several days and has restarted.  He converted to sinus rhythm with IV diltiazem.  He has not had any recurrence.  No further work-up is necessary at this time.  He remains in sinus rhythm today.

## 2022-01-26 NOTE — Patient Instructions (Signed)

## 2022-01-27 DIAGNOSIS — K831 Obstruction of bile duct: Secondary | ICD-10-CM | POA: Diagnosis not present

## 2022-01-31 ENCOUNTER — Inpatient Hospital Stay: Payer: BC Managed Care – PPO | Admitting: Nurse Practitioner

## 2022-01-31 ENCOUNTER — Inpatient Hospital Stay: Payer: BC Managed Care – PPO

## 2022-02-06 ENCOUNTER — Other Ambulatory Visit: Payer: Self-pay | Admitting: Oncology

## 2022-02-07 ENCOUNTER — Inpatient Hospital Stay (HOSPITAL_BASED_OUTPATIENT_CLINIC_OR_DEPARTMENT_OTHER): Payer: BC Managed Care – PPO | Admitting: Nurse Practitioner

## 2022-02-07 ENCOUNTER — Other Ambulatory Visit: Payer: Self-pay

## 2022-02-07 ENCOUNTER — Encounter: Payer: Self-pay | Admitting: *Deleted

## 2022-02-07 ENCOUNTER — Inpatient Hospital Stay: Payer: BC Managed Care – PPO | Attending: Oncology

## 2022-02-07 ENCOUNTER — Inpatient Hospital Stay: Payer: BC Managed Care – PPO

## 2022-02-07 ENCOUNTER — Encounter: Payer: Self-pay | Admitting: Nurse Practitioner

## 2022-02-07 VITALS — BP 134/77 | HR 62 | Temp 98.0°F | Resp 18

## 2022-02-07 VITALS — BP 124/83 | HR 60 | Temp 98.1°F | Resp 20 | Ht 71.0 in | Wt 167.2 lb

## 2022-02-07 DIAGNOSIS — Z5111 Encounter for antineoplastic chemotherapy: Secondary | ICD-10-CM | POA: Insufficient documentation

## 2022-02-07 DIAGNOSIS — R748 Abnormal levels of other serum enzymes: Secondary | ICD-10-CM | POA: Insufficient documentation

## 2022-02-07 DIAGNOSIS — Z5189 Encounter for other specified aftercare: Secondary | ICD-10-CM | POA: Diagnosis not present

## 2022-02-07 DIAGNOSIS — R197 Diarrhea, unspecified: Secondary | ICD-10-CM | POA: Diagnosis not present

## 2022-02-07 DIAGNOSIS — C250 Malignant neoplasm of head of pancreas: Secondary | ICD-10-CM | POA: Insufficient documentation

## 2022-02-07 DIAGNOSIS — Z86718 Personal history of other venous thrombosis and embolism: Secondary | ICD-10-CM | POA: Insufficient documentation

## 2022-02-07 DIAGNOSIS — I4891 Unspecified atrial fibrillation: Secondary | ICD-10-CM | POA: Diagnosis not present

## 2022-02-07 DIAGNOSIS — D702 Other drug-induced agranulocytosis: Secondary | ICD-10-CM | POA: Diagnosis not present

## 2022-02-07 DIAGNOSIS — F319 Bipolar disorder, unspecified: Secondary | ICD-10-CM | POA: Insufficient documentation

## 2022-02-07 DIAGNOSIS — K831 Obstruction of bile duct: Secondary | ICD-10-CM | POA: Diagnosis not present

## 2022-02-07 LAB — CBC WITH DIFFERENTIAL (CANCER CENTER ONLY)
Abs Immature Granulocytes: 0.14 10*3/uL — ABNORMAL HIGH (ref 0.00–0.07)
Basophils Absolute: 0.1 10*3/uL (ref 0.0–0.1)
Basophils Relative: 1 %
Eosinophils Absolute: 0.4 10*3/uL (ref 0.0–0.5)
Eosinophils Relative: 4 %
HCT: 38.2 % — ABNORMAL LOW (ref 39.0–52.0)
Hemoglobin: 12.7 g/dL — ABNORMAL LOW (ref 13.0–17.0)
Immature Granulocytes: 2 %
Lymphocytes Relative: 20 %
Lymphs Abs: 1.9 10*3/uL (ref 0.7–4.0)
MCH: 32.6 pg (ref 26.0–34.0)
MCHC: 33.2 g/dL (ref 30.0–36.0)
MCV: 98.2 fL (ref 80.0–100.0)
Monocytes Absolute: 0.6 10*3/uL (ref 0.1–1.0)
Monocytes Relative: 6 %
Neutro Abs: 6.4 10*3/uL (ref 1.7–7.7)
Neutrophils Relative %: 67 %
Platelet Count: 173 10*3/uL (ref 150–400)
RBC: 3.89 MIL/uL — ABNORMAL LOW (ref 4.22–5.81)
RDW: 14.4 % (ref 11.5–15.5)
WBC Count: 9.5 10*3/uL (ref 4.0–10.5)
nRBC: 0 % (ref 0.0–0.2)

## 2022-02-07 LAB — CMP (CANCER CENTER ONLY)
ALT: 69 U/L — ABNORMAL HIGH (ref 0–44)
AST: 35 U/L (ref 15–41)
Albumin: 4.3 g/dL (ref 3.5–5.0)
Alkaline Phosphatase: 212 U/L — ABNORMAL HIGH (ref 38–126)
Anion gap: 9 (ref 5–15)
BUN: 12 mg/dL (ref 6–20)
CO2: 28 mmol/L (ref 22–32)
Calcium: 9.6 mg/dL (ref 8.9–10.3)
Chloride: 104 mmol/L (ref 98–111)
Creatinine: 0.67 mg/dL (ref 0.61–1.24)
GFR, Estimated: >60 mL/min (ref >60)
Glucose, Bld: 92 mg/dL (ref 70–99)
Potassium: 4.1 mmol/L (ref 3.5–5.1)
Sodium: 141 mmol/L (ref 135–145)
Total Bilirubin: 0.4 mg/dL (ref 0.3–1.2)
Total Protein: 6.7 g/dL (ref 6.5–8.1)

## 2022-02-07 MED ORDER — SODIUM CHLORIDE 0.9 % IV SOLN
120.0000 mg/m2 | Freq: Once | INTRAVENOUS | Status: AC
  Administered 2022-02-07: 240 mg via INTRAVENOUS
  Filled 2022-02-07: qty 10

## 2022-02-07 MED ORDER — SODIUM CHLORIDE 0.9 % IV SOLN
400.0000 mg/m2 | Freq: Once | INTRAVENOUS | Status: AC
  Administered 2022-02-07: 772 mg via INTRAVENOUS
  Filled 2022-02-07: qty 38.6

## 2022-02-07 MED ORDER — SODIUM CHLORIDE 0.9 % IV SOLN
2000.0000 mg/m2 | INTRAVENOUS | Status: DC
  Administered 2022-02-07: 3850 mg via INTRAVENOUS
  Filled 2022-02-07: qty 77

## 2022-02-07 MED ORDER — SODIUM CHLORIDE 0.9 % IV SOLN
150.0000 mg | Freq: Once | INTRAVENOUS | Status: AC
  Administered 2022-02-07: 150 mg via INTRAVENOUS
  Filled 2022-02-07: qty 150

## 2022-02-07 MED ORDER — PALONOSETRON HCL INJECTION 0.25 MG/5ML
0.2500 mg | Freq: Once | INTRAVENOUS | Status: AC
  Administered 2022-02-07: 0.25 mg via INTRAVENOUS
  Filled 2022-02-07: qty 5

## 2022-02-07 MED ORDER — OXALIPLATIN CHEMO INJECTION 100 MG/20ML
65.0000 mg/m2 | Freq: Once | INTRAVENOUS | Status: AC
  Administered 2022-02-07: 125 mg via INTRAVENOUS
  Filled 2022-02-07: qty 20

## 2022-02-07 MED ORDER — DEXTROSE 5 % IV SOLN: Freq: Once | INTRAVENOUS | Status: AC

## 2022-02-07 MED ORDER — ATROPINE SULFATE 1 MG/ML IV SOLN
0.5000 mg | Freq: Once | INTRAVENOUS | Status: AC | PRN
  Administered 2022-02-07: 0.5 mg via INTRAVENOUS
  Filled 2022-02-07: qty 1

## 2022-02-07 MED ORDER — SODIUM CHLORIDE 0.9 % IV SOLN
10.0000 mg | Freq: Once | INTRAVENOUS | Status: AC
  Administered 2022-02-07: 10 mg via INTRAVENOUS
  Filled 2022-02-07: qty 10

## 2022-02-07 NOTE — Progress Notes (Signed)
?  [facility] ?OFFICE PROGRESS NOTE ? ? ?Diagnosis: Pancreas cancer ? ?INTERVAL HISTORY:  ? ?Dean [name] returns as scheduled.  He completed cycle 2 FOLFIRINOX 01/24/2022.  He denies nausea/vomiting.  He developed a small sore on the roof of his mouth.  He began having diarrhea last week.  He took Imodium.  The diarrhea resolved over the course of the day.  He did not experience cold sensitivity. ? ?Objective: ? ?Vital signs in last 24 hours: ? ?Blood pressure 124/83, pulse 60, temperature 98.1 ?F (36.7 ?C), temperature source Oral, resp. rate 20, height '5\' 11"'$  (1.803 m), weight 167 lb 3.2 oz (75.8 kg), SpO2 100 %. ?  ? ?HEENT: Single ulceration mid region of the roof of the mouth.  There is surrounding erythema. ?Resp: Lungs clear bilaterally. ?Cardio: Regular rate and rhythm. ?GI: Abdomen soft and nontender.  No hepatosplenomegaly. ?Vascular: No leg edema. ?Skin: Palms without erythema. ?Port-A-Cath without erythema. ? ?Lab Results: ? ?Lab Results  ?Component Value Date  ? WBC 9.5 02/07/2022  ? HGB 12.7 (L) 02/07/2022  ? HCT 38.2 (L) 02/07/2022  ? MCV 98.2 02/07/2022  ? PLT 173 02/07/2022  ? NEUTROABS 6.4 02/07/2022  ? ? ?Imaging: ? ?No results found. ? ?Medications: I have reviewed the patient's current medications. ? ?Assessment/Plan: ?Pancreas cancer, status post a pancreaticoduodenectomy 11/12/2021, stage IIb (pT1,pN1) ?1/11 lymph nodes, perineural invasion positive, positive margin at the SMA and SMV ?MRCP 09/27/2021-diffuse intrahepatic and common bile duct dilatation with abrupt termination at the level of the head of the pancreas, indistinct area of hypoenhancement in the pancreas head ?ERCP with placement of pancreatic duct and bile duct stents 10/04/2021 ?EUS 1 10/04/2021-no pancreas mass identified, FNA of pancreas head-"atypical "cells, no adenopathy ?CT abdomen/pelvis 10/18/2021-no focal liver abnormality, intra and extrahepatic biliary ductal dilatation with narrowing of the common  duct and the pancreas head, no enlarged abdominal lymph nodes ?Cycle 1 FOLFIRINOX 12/27/2021, irinotecan held with cycle 1 ?Cycle 2 FOLFIRINOX 01/24/2022, oxaliplatin and irinotecan dose reduced secondary to elevated liver enzymes, Udenyca ?Cycle 3 FOLFIRINOX 02/07/2022, same doses as cycle 2 ?Acute pancreatitis 10/04/2021 ?Bipolar disorder ?Left soleal DVT 11/26/2021-apixaban ?Port-A-Cath placement 12/22/2021  ?Atrial fibrillation 12/22/2021, converted to sinus rhythm, discharged home 12/23/2021 on metoprolol 12.5 mg twice daily, on Eliquis for prior DVT. ?Neutropenia secondary to chemotherapy 01/10/2022, Udenyca added with cycle 2 ?Elevated liver enzymes, predating chemotherapy ?  ?  ? ?Disposition: Dean [name] appears stable.  He has completed 2 cycles of FOLFIRINOX.  Oxaliplatin and irinotecan were dose reduced with cycle 2 due to elevated liver enzymes.  Plan to proceed with cycle 3 today as scheduled.   ? ?CBC and chemistry panel reviewed.  Labs adequate to proceed as above.  Liver enzymes are better. ? ?He developed diarrhea week 2, lasting 1 day.  We reviewed dosing instructions for Imodium for irinotecan induced diarrhea. ? ?He will return for lab, follow-up, cycle 3 FOLFIRINOX in 2 weeks.  He will contact the office in the interim with any problems. ? ? ? ?Ned Card ANP/GNP-BC  ? ?02/07/2022  ?8:41 AM ? ? ? ? ? ? ? ?

## 2022-02-07 NOTE — Progress Notes (Signed)
Patient seen by Ned Card NP today ? ?Vitals are within treatment parameters. ? ?Labs reviewed by Ned Card NP and are within treatment parameters. ? ?Per physician team, patient is ready for treatment and there are NO modifications to the treatment plan. ?Will maintain earlier dose reduction for now.  ?

## 2022-02-07 NOTE — Progress Notes (Signed)
Patient presents for treatment. RN assessment completed along with the following: ? ?Labs/vitals reviewed - Yes, and please see collab nurse note.    ?Weight within 10% of previous measurement - Yes ?Informed consent completed and reflects current therapy/intent - Yes, on date 12/27/2021             ?Provider progress note reviewed - Today's provider note is not yet available. I reviewed the most recent oncology provider progress note in chart dated 01/24/2022. ?Treatment/Antibody/Supportive plan reviewed - Yes, and there are no adjustments needed for today's treatment. ?S&H and other orders reviewed - Yes, and there are no additional orders identified. ?Previous treatment date reviewed - Yes, and the appropriate amount of time has elapsed between treatments. ?Clinic Hand Off Received from - Yes from [place], RN ? ?Patient to proceed with treatment.  ? ?

## 2022-02-07 NOTE — Patient Instructions (Signed)
[facility]   Discharge Instructions: Thank you for choosing [facility] to provide your oncology and hematology care.   If you have a lab appointment with the [facility], please go directly to the [facility] and check in at the registration area.   Wear comfortable clothing and clothing appropriate for easy access to any Portacath or PICC line.   We strive to give you quality time with your provider. You may need to reschedule your appointment if you arrive late (15 or more minutes).  Arriving late affects you and other patients whose appointments are after yours.  Also, if you miss three or more appointments without notifying the office, you may be dismissed from the clinic at the providers discretion.      For prescription refill requests, have your pharmacy contact our office and allow 72 hours for refills to be completed.    Today you received the following chemotherapy and/or immunotherapy agents Oxaliplatin (ELOXATIN), Irinotecan (CAMPTOSAR), Leucovorin & Flourouracil (ADRUCIL).      To help prevent nausea and vomiting after your treatment, we encourage you to take your nausea medication as directed.  BELOW ARE SYMPTOMS THAT SHOULD BE REPORTED IMMEDIATELY: *FEVER GREATER THAN 100.4 F (38 C) OR HIGHER *CHILLS OR SWEATING *NAUSEA AND VOMITING THAT IS NOT CONTROLLED WITH YOUR NAUSEA MEDICATION *UNUSUAL SHORTNESS OF BREATH *UNUSUAL BRUISING OR BLEEDING *URINARY PROBLEMS (pain or burning when urinating, or frequent urination) *BOWEL PROBLEMS (unusual diarrhea, constipation, pain near the anus) TENDERNESS IN MOUTH AND THROAT WITH OR WITHOUT PRESENCE OF ULCERS (sore throat, sores in mouth, or a toothache) UNUSUAL RASH, SWELLING OR PAIN  UNUSUAL VAGINAL DISCHARGE OR ITCHING   Items with * indicate a potential emergency and should be followed up as soon as possible or go to the Emergency Department if any problems should occur.  Please  show the CHEMOTHERAPY ALERT CARD or IMMUNOTHERAPY ALERT CARD at check-in to the Emergency Department and triage nurse.  Should you have questions after your visit or need to cancel or reschedule your appointment, please contact [facility]  Dept: [phone]  and follow the prompts.  Office hours are 8:00 a.m. to 4:30 p.m. Monday - Friday. Please note that voicemails left after 4:00 p.m. may not be returned until the following business day.  We are closed weekends and major holidays. You have access to a nurse at all times for urgent questions. Please call the main number to the clinic Dept: [phone] and follow the prompts.   For any non-urgent questions, you may also contact your provider using MyChart. We now offer e-Visits for anyone 23 and older to request care online for non-urgent symptoms. For details visit [email or web address].   Also download the MyChart app! Go to the app store, search "MyChart", open the app, select , and log in with your MyChart username and password.  Due to Covid, a mask is required upon entering the hospital/clinic. If you do not have a mask, one will be given to you upon arrival. For doctor visits, patients may have 1 support person aged 70 or older with them. For treatment visits, patients cannot have anyone with them due to current Covid guidelines and our immunocompromised population.   Oxaliplatin Injection What is this medication? OXALIPLATIN (ox AL i PLA tin) is a chemotherapy drug. It targets fast dividing cells, like cancer cells, and causes these cells to die. This medicine is used to treat cancers of the colon and  rectum, and many other cancers. This medicine may be used for other purposes; ask your health care provider or pharmacist if you have questions. COMMON BRAND NAME(S): Eloxatin What should I tell my care team before I take this medication? They need to know if you have any of these conditions: heart  disease history of irregular heartbeat liver disease low blood counts, like white cells, platelets, or red blood cells lung or breathing disease, like asthma take medicines that treat or prevent blood clots tingling of the fingers or toes, or other nerve disorder an unusual or allergic reaction to oxaliplatin, other chemotherapy, other medicines, foods, dyes, or preservatives pregnant or trying to get pregnant breast-feeding How should I use this medication? This drug is given as an infusion into a vein. It is administered in a hospital or clinic by a specially trained health care professional. Talk to your pediatrician regarding the use of this medicine in children. Special care may be needed. Overdosage: If you think you have taken too much of this medicine contact a poison control center or emergency room at once. NOTE: This medicine is only for you. Do not share this medicine with others. What if I miss a dose? It is important not to miss a dose. Call your doctor or health care professional if you are unable to keep an appointment. What may interact with this medication? Do not take this medicine with any of the following medications: cisapride dronedarone pimozide thioridazine This medicine may also interact with the following medications: aspirin and aspirin-like medicines certain medicines that treat or prevent blood clots like warfarin, apixaban, dabigatran, and rivaroxaban cisplatin cyclosporine diuretics medicines for infection like acyclovir, adefovir, amphotericin B, bacitracin, cidofovir, foscarnet, ganciclovir, gentamicin, pentamidine, vancomycin NSAIDs, medicines for pain and inflammation, like ibuprofen or naproxen other medicines that prolong the QT interval (an abnormal heart rhythm) pamidronate zoledronic acid This list may not describe all possible interactions. Give your health care provider a list of all the medicines, herbs, non-prescription drugs, or dietary  supplements you use. Also tell them if you smoke, drink alcohol, or use illegal drugs. Some items may interact with your medicine. What should I watch for while using this medication? Your condition will be monitored carefully while you are receiving this medicine. You may need blood work done while you are taking this medicine. This medicine may make you feel generally unwell. This is not uncommon as chemotherapy can affect healthy cells as well as cancer cells. Report any side effects. Continue your course of treatment even though you feel ill unless your healthcare professional tells you to stop. This medicine can make you more sensitive to cold. Do not drink cold drinks or use ice. Cover exposed skin before coming in contact with cold temperatures or cold objects. When out in cold weather wear warm clothing and cover your mouth and nose to warm the air that goes into your lungs. Tell your doctor if you get sensitive to the cold. Do not become pregnant while taking this medicine or for 9 months after stopping it. Women should inform their health care professional if they wish to become pregnant or think they might be pregnant. Men should not father a child while taking this medicine and for 6 months after stopping it. There is potential for serious side effects to an unborn child. Talk to your health care professional for more information. Do not breast-feed a child while taking this medicine or for 3 months after stopping it. This medicine has caused ovarian  failure in some women. This medicine may make it more difficult to get pregnant. Talk to your health care professional if you are concerned about your fertility. This medicine has caused decreased sperm counts in some men. This may make it more difficult to father a child. Talk to your health care professional if you are concerned about your fertility. This medicine may increase your risk of getting an infection. Call your health care professional  for advice if you get a fever, chills, or sore throat, or other symptoms of a cold or flu. Do not treat yourself. Try to avoid being around people who are sick. Avoid taking medicines that contain aspirin, acetaminophen, ibuprofen, naproxen, or ketoprofen unless instructed by your health care professional. These medicines may hide a fever. Be careful brushing or flossing your teeth or using a toothpick because you may get an infection or bleed more easily. If you have any dental work done, tell your dentist you are receiving this medicine. What side effects may I notice from receiving this medication? Side effects that you should report to your doctor or health care professional as soon as possible: allergic reactions like skin rash, itching or hives, swelling of the face, lips, or tongue breathing problems cough low blood counts - this medicine may decrease the number of white blood cells, red blood cells, and platelets. You may be at increased risk for infections and bleeding nausea, vomiting pain, redness, or irritation at site where injected pain, tingling, numbness in the hands or feet signs and symptoms of bleeding such as bloody or black, tarry stools; red or dark brown urine; spitting up blood or brown material that looks like coffee grounds; red spots on the skin; unusual bruising or bleeding from the eyes, gums, or nose signs and symptoms of a dangerous change in heartbeat or heart rhythm like chest pain; dizziness; fast, irregular heartbeat; palpitations; feeling faint or lightheaded; falls signs and symptoms of infection like fever; chills; cough; sore throat; pain or trouble passing urine signs and symptoms of liver injury like dark yellow or brown urine; general ill feeling or flu-like symptoms; light-colored stools; loss of appetite; nausea; right upper belly pain; unusually weak or tired; yellowing of the eyes or skin signs and symptoms of low red blood cells or anemia such as  unusually weak or tired; feeling faint or lightheaded; falls signs and symptoms of muscle injury like dark urine; trouble passing urine or change in the amount of urine; unusually weak or tired; muscle pain; back pain Side effects that usually do not require medical attention (report to your doctor or health care professional if they continue or are bothersome): changes in taste diarrhea gas hair loss loss of appetite mouth sores This list may not describe all possible side effects. Call your doctor for medical advice about side effects. You may report side effects to FDA at 1-800-FDA-1088. Where should I keep my medication? This drug is given in a hospital or clinic and will not be stored at home. NOTE: This sheet is a summary. It may not cover all possible information. If you have questions about this medicine, talk to your doctor, pharmacist, or health care provider.  2022 Elsevier/Gold Standard (2021-08-10 00:00:00)  Irinotecan injection What is this medication? IRINOTECAN (ir in oh TEE kan ) is a chemotherapy drug. It is used to treat colon and rectal cancer. This medicine may be used for other purposes; ask your health care provider or pharmacist if you have questions. COMMON BRAND NAME(S): Camptosar  What should I tell my care team before I take this medication? They need to know if you have any of these conditions: dehydration diarrhea infection (especially a virus infection such as chickenpox, cold sores, or herpes) liver disease low blood counts, like low white cell, platelet, or red cell counts low levels of calcium, magnesium, or potassium in the blood recent or ongoing radiation therapy an unusual or allergic reaction to irinotecan, other medicines, foods, dyes, or preservatives pregnant or trying to get pregnant breast-feeding How should I use this medication? This drug is given as an infusion into a vein. It is administered in a hospital or clinic by a specially  trained health care professional. Talk to your pediatrician regarding the use of this medicine in children. Special care may be needed. Overdosage: If you think you have taken too much of this medicine contact a poison control center or emergency room at once. NOTE: This medicine is only for you. Do not share this medicine with others. What if I miss a dose? It is important not to miss your dose. Call your doctor or health care professional if you are unable to keep an appointment. What may interact with this medication? Do not take this medicine with any of the following medications: cobicistat itraconazole This medicine may interact with the following medications: antiviral medicines for HIV or AIDS certain antibiotics like rifampin or rifabutin certain medicines for fungal infections like ketoconazole, posaconazole, and voriconazole certain medicines for seizures like carbamazepine, phenobarbital, phenotoin clarithromycin gemfibrozil nefazodone [facility]. John's Wort This list may not describe all possible interactions. Give your health care provider a list of all the medicines, herbs, non-prescription drugs, or dietary supplements you use. Also tell them if you smoke, drink alcohol, or use illegal drugs. Some items may interact with your medicine. What should I watch for while using this medication? Your condition will be monitored carefully while you are receiving this medicine. You will need important blood work done while you are taking this medicine. This drug may make you feel generally unwell. This is not uncommon, as chemotherapy can affect healthy cells as well as cancer cells. Report any side effects. Continue your course of treatment even though you feel ill unless your doctor tells you to stop. In some cases, you may be given additional medicines to help with side effects. Follow all directions for their use. You may get drowsy or dizzy. Do not drive, use machinery, or do anything  that needs mental alertness until you know how this medicine affects you. Do not stand or sit up quickly, especially if you are an older patient. This reduces the risk of dizzy or fainting spells. Call your health care professional for advice if you get a fever, chills, or sore throat, or other symptoms of a cold or flu. Do not treat yourself. This medicine decreases your body's ability to fight infections. Try to avoid being around people who are sick. Avoid taking products that contain aspirin, acetaminophen, ibuprofen, naproxen, or ketoprofen unless instructed by your doctor. These medicines may hide a fever. This medicine may increase your risk to bruise or bleed. Call your doctor or health care professional if you notice any unusual bleeding. Be careful brushing and flossing your teeth or using a toothpick because you may get an infection or bleed more easily. If you have any dental work done, tell your dentist you are receiving this medicine. Do not become pregnant while taking this medicine or for 6 months after stopping it. Women  should inform their health care professional if they wish to become pregnant or think they might be pregnant. Men should not father a child while taking this medicine and for 3 months after stopping it. There is potential for serious side effects to an unborn child. Talk to your health care professional for more information. Do not breast-feed an infant while taking this medicine or for 7 days after stopping it. This medicine has caused ovarian failure in some women. This medicine may make it more difficult to get pregnant. Talk to your health care professional if you are concerned about your fertility. This medicine has caused decreased sperm counts in some men. This may make it more difficult to father a child. Talk to your health care professional if you are concerned about your fertility. What side effects may I notice from receiving this medication? Side effects that  you should report to your doctor or health care professional as soon as possible: allergic reactions like skin rash, itching or hives, swelling of the face, lips, or tongue chest pain diarrhea flushing, runny nose, sweating during infusion low blood counts - this medicine may decrease the number of white blood cells, red blood cells and platelets. You may be at increased risk for infections and bleeding. nausea, vomiting pain, swelling, warmth in the leg signs of decreased platelets or bleeding - bruising, pinpoint red spots on the skin, black, tarry stools, blood in the urine signs of infection - fever or chills, cough, sore throat, pain or difficulty passing urine signs of decreased red blood cells - unusually weak or tired, fainting spells, lightheadedness Side effects that usually do not require medical attention (report to your doctor or health care professional if they continue or are bothersome): constipation hair loss headache loss of appetite mouth sores stomach pain This list may not describe all possible side effects. Call your doctor for medical advice about side effects. You may report side effects to FDA at 1-800-FDA-1088. Where should I keep my medication? This drug is given in a hospital or clinic and will not be stored at home. NOTE: This sheet is a summary. It may not cover all possible information. If you have questions about this medicine, talk to your doctor, pharmacist, or health care provider.  2022 Elsevier/Gold Standard (2021-08-10 00:00:00)  Leucovorin injection What is this medication? LEUCOVORIN (loo koe VOR in) is used to prevent or treat the harmful effects of some medicines. This medicine is used to treat anemia caused by a low amount of folic acid in the body. It is also used with 5-fluorouracil (5-FU) to treat colon cancer. This medicine may be used for other purposes; ask your health care provider or pharmacist if you have questions. What should I tell  my care team before I take this medication? They need to know if you have any of these conditions: anemia from low levels of vitamin B-12 in the blood an unusual or allergic reaction to leucovorin, folic acid, other medicines, foods, dyes, or preservatives pregnant or trying to get pregnant breast-feeding How should I use this medication? This medicine is for injection into a muscle or into a vein. It is given by a health care professional in a hospital or clinic setting. Talk to your pediatrician regarding the use of this medicine in children. Special care may be needed. Overdosage: If you think you have taken too much of this medicine contact a poison control center or emergency room at once. NOTE: This medicine is only for you. Do  not share this medicine with others. What if I miss a dose? This does not apply. What may interact with this medication? capecitabine fluorouracil phenobarbital phenytoin primidone trimethoprim-sulfamethoxazole This list may not describe all possible interactions. Give your health care provider a list of all the medicines, herbs, non-prescription drugs, or dietary supplements you use. Also tell them if you smoke, drink alcohol, or use illegal drugs. Some items may interact with your medicine. What should I watch for while using this medication? Your condition will be monitored carefully while you are receiving this medicine. This medicine may increase the side effects of 5-fluorouracil, 5-FU. Tell your doctor or health care professional if you have diarrhea or mouth sores that do not get better or that get worse. What side effects may I notice from receiving this medication? Side effects that you should report to your doctor or health care professional as soon as possible: allergic reactions like skin rash, itching or hives, swelling of the face, lips, or tongue breathing problems fever, infection mouth sores unusual bleeding or bruising unusually weak or  tired Side effects that usually do not require medical attention (report to your doctor or health care professional if they continue or are bothersome): constipation or diarrhea loss of appetite nausea, vomiting This list may not describe all possible side effects. Call your doctor for medical advice about side effects. You may report side effects to FDA at 1-800-FDA-1088. Where should I keep my medication? This drug is given in a hospital or clinic and will not be stored at home. NOTE: This sheet is a summary. It may not cover all possible information. If you have questions about this medicine, talk to your doctor, pharmacist, or health care provider.  2022 Elsevier/Gold Standard (2008-05-29 00:00:00)  Fluorouracil, 5-FU injection What is this medication? FLUOROURACIL, 5-FU (flure oh YOOR a sil) is a chemotherapy drug. It slows the growth of cancer cells. This medicine is used to treat many types of cancer like breast cancer, colon or rectal cancer, pancreatic cancer, and stomach cancer. This medicine may be used for other purposes; ask your health care provider or pharmacist if you have questions. COMMON BRAND NAME(S): Adrucil What should I tell my care team before I take this medication? They need to know if you have any of these conditions: blood disorders dihydropyrimidine dehydrogenase (DPD) deficiency infection (especially a virus infection such as chickenpox, cold sores, or herpes) kidney disease liver disease malnourished, poor nutrition recent or ongoing radiation therapy an unusual or allergic reaction to fluorouracil, other chemotherapy, other medicines, foods, dyes, or preservatives pregnant or trying to get pregnant breast-feeding How should I use this medication? This drug is given as an infusion or injection into a vein. It is administered in a hospital or clinic by a specially trained health care professional. Talk to your pediatrician regarding the use of this  medicine in children. Special care may be needed. Overdosage: If you think you have taken too much of this medicine contact a poison control center or emergency room at once. NOTE: This medicine is only for you. Do not share this medicine with others. What if I miss a dose? It is important not to miss your dose. Call your doctor or health care professional if you are unable to keep an appointment. What may interact with this medication? Do not take this medicine with any of the following medications: live virus vaccines This medicine may also interact with the following medications: medicines that treat or prevent blood clots like warfarin,   enoxaparin, and dalteparin This list may not describe all possible interactions. Give your health care provider a list of all the medicines, herbs, non-prescription drugs, or dietary supplements you use. Also tell them if you smoke, drink alcohol, or use illegal drugs. Some items may interact with your medicine. What should I watch for while using this medication? Visit your doctor for checks on your progress. This drug may make you feel generally unwell. This is not uncommon, as chemotherapy can affect healthy cells as well as cancer cells. Report any side effects. Continue your course of treatment even though you feel ill unless your doctor tells you to stop. In some cases, you may be given additional medicines to help with side effects. Follow all directions for their use. Call your doctor or health care professional for advice if you get a fever, chills or sore throat, or other symptoms of a cold or flu. Do not treat yourself. This drug decreases your body's ability to fight infections. Try to avoid being around people who are sick. This medicine may increase your risk to bruise or bleed. Call your doctor or health care professional if you notice any unusual bleeding. Be careful brushing and flossing your teeth or using a toothpick because you may get an  infection or bleed more easily. If you have any dental work done, tell your dentist you are receiving this medicine. Avoid taking products that contain aspirin, acetaminophen, ibuprofen, naproxen, or ketoprofen unless instructed by your doctor. These medicines may hide a fever. Do not become pregnant while taking this medicine. Women should inform their doctor if they wish to become pregnant or think they might be pregnant. There is a potential for serious side effects to an unborn child. Talk to your health care professional or pharmacist for more information. Do not breast-feed an infant while taking this medicine. Men should inform their doctor if they wish to father a child. This medicine may lower sperm counts. Do not treat diarrhea with over the counter products. Contact your doctor if you have diarrhea that lasts more than 2 days or if it is severe and watery. This medicine can make you more sensitive to the sun. Keep out of the sun. If you cannot avoid being in the sun, wear protective clothing and use sunscreen. Do not use sun lamps or tanning beds/booths. What side effects may I notice from receiving this medication? Side effects that you should report to your doctor or health care professional as soon as possible: allergic reactions like skin rash, itching or hives, swelling of the face, lips, or tongue low blood counts - this medicine may decrease the number of white blood cells, red blood cells and platelets. You may be at increased risk for infections and bleeding. signs of infection - fever or chills, cough, sore throat, pain or difficulty passing urine signs of decreased platelets or bleeding - bruising, pinpoint red spots on the skin, black, tarry stools, blood in the urine signs of decreased red blood cells - unusually weak or tired, fainting spells, lightheadedness breathing problems changes in vision chest pain mouth sores nausea and vomiting pain, swelling, redness at site  where injected pain, tingling, numbness in the hands or feet redness, swelling, or sores on hands or feet stomach pain unusual bleeding Side effects that usually do not require medical attention (report to your doctor or health care professional if they continue or are bothersome): changes in finger or toe nails diarrhea dry or itchy skin hair loss headache loss   of appetite sensitivity of eyes to the light stomach upset unusually teary eyes This list may not describe all possible side effects. Call your doctor for medical advice about side effects. You may report side effects to FDA at 1-800-FDA-1088. Where should I keep my medication? This drug is given in a hospital or clinic and will not be stored at home. NOTE: This sheet is a summary. It may not cover all possible information. If you have questions about this medicine, talk to your doctor, pharmacist, or health care provider.  2022 [employer]/Gold Standard (2021-08-10 00:00:00)  The chemotherapy medication bag should finish at 46 hours, 96 hours, or 7 days. For example, if your pump is scheduled for 46 hours and it was put on at 4:00 p.m., it should finish at 2:00 p.m. the day it is scheduled to come off regardless of your appointment time.     Estimated time to finish at 12:45 p.m. on Wednesday 02/09/2022.   If the display on your pump reads "Low Volume" and it is beeping, take the batteries out of the pump and come to the cancer center for it to be taken off.   If the pump alarms go off prior to the pump reading "Low Volume" then call [phone] and someone can assist you.  If the plunger comes out and the chemotherapy medication is leaking out, please use your home chemo spill kit to clean up the spill. Do NOT use paper towels or other household products.  If you have problems or questions regarding your pump, please call either [phone] (24 hours a day) or the cancer center Monday-Friday 8:00 a.m.- 4:30 p.m. at the  clinic number and we will assist you. If you are unable to get assistance, then go to the nearest Emergency Department and ask the staff to contact the IV team for assistance.

## 2022-02-09 ENCOUNTER — Other Ambulatory Visit: Payer: Self-pay

## 2022-02-09 ENCOUNTER — Inpatient Hospital Stay: Payer: BC Managed Care – PPO

## 2022-02-09 VITALS — BP 116/80 | HR 64 | Temp 98.4°F | Resp 18

## 2022-02-09 DIAGNOSIS — D702 Other drug-induced agranulocytosis: Secondary | ICD-10-CM | POA: Diagnosis not present

## 2022-02-09 DIAGNOSIS — Z5111 Encounter for antineoplastic chemotherapy: Secondary | ICD-10-CM | POA: Diagnosis not present

## 2022-02-09 DIAGNOSIS — I4891 Unspecified atrial fibrillation: Secondary | ICD-10-CM | POA: Diagnosis not present

## 2022-02-09 DIAGNOSIS — R197 Diarrhea, unspecified: Secondary | ICD-10-CM | POA: Diagnosis not present

## 2022-02-09 DIAGNOSIS — Z5189 Encounter for other specified aftercare: Secondary | ICD-10-CM | POA: Diagnosis not present

## 2022-02-09 DIAGNOSIS — C250 Malignant neoplasm of head of pancreas: Secondary | ICD-10-CM | POA: Diagnosis not present

## 2022-02-09 DIAGNOSIS — F319 Bipolar disorder, unspecified: Secondary | ICD-10-CM | POA: Diagnosis not present

## 2022-02-09 DIAGNOSIS — R748 Abnormal levels of other serum enzymes: Secondary | ICD-10-CM | POA: Diagnosis not present

## 2022-02-09 DIAGNOSIS — Z86718 Personal history of other venous thrombosis and embolism: Secondary | ICD-10-CM | POA: Diagnosis not present

## 2022-02-09 MED ORDER — HEPARIN SOD (PORK) LOCK FLUSH 100 UNIT/ML IV SOLN
500.0000 [IU] | Freq: Once | INTRAVENOUS | Status: AC | PRN
  Administered 2022-02-09: 500 [IU]

## 2022-02-09 MED ORDER — SODIUM CHLORIDE 0.9% FLUSH
10.0000 mL | INTRAVENOUS | Status: DC | PRN
  Administered 2022-02-09: 10 mL

## 2022-02-09 MED ORDER — PEGFILGRASTIM-CBQV 6 MG/0.6ML ~~LOC~~ SOSY
6.0000 mg | PREFILLED_SYRINGE | Freq: Once | SUBCUTANEOUS | Status: AC
  Administered 2022-02-09: 6 mg via SUBCUTANEOUS
  Filled 2022-02-09: qty 0.6

## 2022-02-09 NOTE — Patient Instructions (Signed)

## 2022-02-20 ENCOUNTER — Other Ambulatory Visit: Payer: Self-pay | Admitting: Oncology

## 2022-02-21 ENCOUNTER — Encounter: Payer: Self-pay | Admitting: Nurse Practitioner

## 2022-02-21 ENCOUNTER — Inpatient Hospital Stay: Payer: BC Managed Care – PPO

## 2022-02-21 ENCOUNTER — Other Ambulatory Visit: Payer: Self-pay

## 2022-02-21 ENCOUNTER — Encounter: Payer: Self-pay | Admitting: *Deleted

## 2022-02-21 ENCOUNTER — Inpatient Hospital Stay (HOSPITAL_BASED_OUTPATIENT_CLINIC_OR_DEPARTMENT_OTHER): Payer: BC Managed Care – PPO | Admitting: Nurse Practitioner

## 2022-02-21 VITALS — BP 137/82 | HR 95 | Temp 98.7°F | Resp 20 | Ht 71.0 in | Wt 166.6 lb

## 2022-02-21 DIAGNOSIS — Z86718 Personal history of other venous thrombosis and embolism: Secondary | ICD-10-CM | POA: Diagnosis not present

## 2022-02-21 DIAGNOSIS — C250 Malignant neoplasm of head of pancreas: Secondary | ICD-10-CM | POA: Diagnosis not present

## 2022-02-21 DIAGNOSIS — Z5189 Encounter for other specified aftercare: Secondary | ICD-10-CM | POA: Diagnosis not present

## 2022-02-21 DIAGNOSIS — K831 Obstruction of bile duct: Secondary | ICD-10-CM | POA: Diagnosis not present

## 2022-02-21 DIAGNOSIS — Z5111 Encounter for antineoplastic chemotherapy: Secondary | ICD-10-CM | POA: Diagnosis not present

## 2022-02-21 DIAGNOSIS — R748 Abnormal levels of other serum enzymes: Secondary | ICD-10-CM | POA: Diagnosis not present

## 2022-02-21 DIAGNOSIS — D702 Other drug-induced agranulocytosis: Secondary | ICD-10-CM | POA: Diagnosis not present

## 2022-02-21 DIAGNOSIS — F319 Bipolar disorder, unspecified: Secondary | ICD-10-CM | POA: Diagnosis not present

## 2022-02-21 DIAGNOSIS — I4891 Unspecified atrial fibrillation: Secondary | ICD-10-CM | POA: Diagnosis not present

## 2022-02-21 DIAGNOSIS — R197 Diarrhea, unspecified: Secondary | ICD-10-CM | POA: Diagnosis not present

## 2022-02-21 LAB — CMP (CANCER CENTER ONLY)
ALT: 87 U/L — ABNORMAL HIGH (ref 0–44)
AST: 69 U/L — ABNORMAL HIGH (ref 15–41)
Albumin: 4.1 g/dL (ref 3.5–5.0)
Alkaline Phosphatase: 297 U/L — ABNORMAL HIGH (ref 38–126)
Anion gap: 10 (ref 5–15)
BUN: 14 mg/dL (ref 6–20)
CO2: 27 mmol/L (ref 22–32)
Calcium: 9.7 mg/dL (ref 8.9–10.3)
Chloride: 103 mmol/L (ref 98–111)
Creatinine: 0.74 mg/dL (ref 0.61–1.24)
GFR, Estimated: >60 mL/min (ref >60)
Glucose, Bld: 99 mg/dL (ref 70–99)
Potassium: 3.9 mmol/L (ref 3.5–5.1)
Sodium: 140 mmol/L (ref 135–145)
Total Bilirubin: 0.3 mg/dL (ref 0.3–1.2)
Total Protein: 6.7 g/dL (ref 6.5–8.1)

## 2022-02-21 LAB — CBC WITH DIFFERENTIAL (CANCER CENTER ONLY)
Abs Immature Granulocytes: 0.33 10*3/uL — ABNORMAL HIGH (ref 0.00–0.07)
Basophils Absolute: 0.1 10*3/uL (ref 0.0–0.1)
Basophils Relative: 1 %
Eosinophils Absolute: 0.3 10*3/uL (ref 0.0–0.5)
Eosinophils Relative: 2 %
HCT: 37.2 % — ABNORMAL LOW (ref 39.0–52.0)
Hemoglobin: 12.5 g/dL — ABNORMAL LOW (ref 13.0–17.0)
Immature Granulocytes: 3 %
Lymphocytes Relative: 17 %
Lymphs Abs: 2.2 10*3/uL (ref 0.7–4.0)
MCH: 33.3 pg (ref 26.0–34.0)
MCHC: 33.6 g/dL (ref 30.0–36.0)
MCV: 99.2 fL (ref 80.0–100.0)
Monocytes Absolute: 0.8 10*3/uL (ref 0.1–1.0)
Monocytes Relative: 6 %
Neutro Abs: 9.1 10*3/uL — ABNORMAL HIGH (ref 1.7–7.7)
Neutrophils Relative %: 71 %
Platelet Count: 171 10*3/uL (ref 150–400)
RBC: 3.75 MIL/uL — ABNORMAL LOW (ref 4.22–5.81)
RDW: 15 % (ref 11.5–15.5)
WBC Count: 12.8 10*3/uL — ABNORMAL HIGH (ref 4.0–10.5)
nRBC: 0.2 % (ref 0.0–0.2)

## 2022-02-21 MED ORDER — OXALIPLATIN CHEMO INJECTION 100 MG/20ML
65.0000 mg/m2 | Freq: Once | INTRAVENOUS | Status: AC
  Administered 2022-02-21: 125 mg via INTRAVENOUS
  Filled 2022-02-21: qty 10

## 2022-02-21 MED ORDER — DEXTROSE 5 % IV SOLN: Freq: Once | INTRAVENOUS | Status: AC

## 2022-02-21 MED ORDER — ATROPINE SULFATE 1 MG/ML IV SOLN
0.5000 mg | Freq: Once | INTRAVENOUS | Status: AC | PRN
  Administered 2022-02-21: 0.5 mg via INTRAVENOUS
  Filled 2022-02-21: qty 1

## 2022-02-21 MED ORDER — SODIUM CHLORIDE 0.9 % IV SOLN
2000.0000 mg/m2 | INTRAVENOUS | Status: DC
  Administered 2022-02-21: 3850 mg via INTRAVENOUS
  Filled 2022-02-21: qty 77

## 2022-02-21 MED ORDER — SODIUM CHLORIDE 0.9 % IV SOLN
10.0000 mg | Freq: Once | INTRAVENOUS | Status: AC
  Administered 2022-02-21: 10 mg via INTRAVENOUS
  Filled 2022-02-21: qty 1

## 2022-02-21 MED ORDER — PALONOSETRON HCL INJECTION 0.25 MG/5ML
0.2500 mg | Freq: Once | INTRAVENOUS | Status: AC
  Administered 2022-02-21: 0.25 mg via INTRAVENOUS
  Filled 2022-02-21: qty 5

## 2022-02-21 MED ORDER — SODIUM CHLORIDE 0.9 % IV SOLN
120.0000 mg/m2 | Freq: Once | INTRAVENOUS | Status: AC
  Administered 2022-02-21: 240 mg via INTRAVENOUS
  Filled 2022-02-21: qty 2

## 2022-02-21 MED ORDER — SODIUM CHLORIDE 0.9 % IV SOLN
400.0000 mg/m2 | Freq: Once | INTRAVENOUS | Status: AC
  Administered 2022-02-21: 772 mg via INTRAVENOUS
  Filled 2022-02-21: qty 38.6

## 2022-02-21 MED ORDER — SODIUM CHLORIDE 0.9 % IV SOLN
150.0000 mg | Freq: Once | INTRAVENOUS | Status: AC
  Administered 2022-02-21: 150 mg via INTRAVENOUS
  Filled 2022-02-21: qty 5

## 2022-02-21 NOTE — Progress Notes (Signed)
Patient seen by Ned Card NP today ? ?Vitals are within treatment parameters. ? ?Labs reviewed by Dr. Benay Spice and are within treatment parameters. ? ?AST 69 and ALT 87--OK to treat. ? ?Per physician team, patient is ready for treatment and there are NO modifications to the treatment plan.  ?

## 2022-02-21 NOTE — Progress Notes (Signed)
?  [facility] ?OFFICE PROGRESS NOTE ? ? ?Diagnosis: Pancreas cancer ? ?INTERVAL HISTORY:  ? ?[name] returns as scheduled.  He completed cycle 3 FOLFIRINOX 02/07/2022.  He denies nausea/vomiting.  No mouth sores.  No significant diarrhea.  He did not experience cold sensitivity.  No numbness or tingling in the hands or feet unrelated to cold exposure.  He has a good appetite.  He denies pain.  No bleeding. ? ?Objective: ? ?Vital signs in last 24 hours: ? ?Blood pressure 137/82, pulse 95, temperature 98.7 ?F (37.1 ?C), temperature source Oral, resp. rate 20, height '5\' 11"'$  (1.803 m), weight 166 lb 9.6 oz (75.6 kg), SpO2 98 %. ?  ? ?HEENT: No thrush or ulcers. ?Resp: Lungs clear bilaterally. ?Cardio: Regular rate and rhythm. ?GI: Abdomen soft and nontender.  No hepatosplenomegaly. ?Vascular: No leg edema. ?Neuro: Vibratory sense mildly decreased to intact over the fingertips per tuning fork exam. ?Skin: Palms without erythema. ?Port-A-Cath without erythema. ? ? ?Lab Results: ? ?Lab Results  ?Component Value Date  ? WBC 12.8 (H) 02/21/2022  ? HGB 12.5 (L) 02/21/2022  ? HCT 37.2 (L) 02/21/2022  ? MCV 99.2 02/21/2022  ? PLT 171 02/21/2022  ? NEUTROABS 9.1 (H) 02/21/2022  ? ? ?Imaging: ? ?No results found. ? ?Medications: I have reviewed the patient's current medications. ? ?Assessment/Plan: ?Pancreas cancer, status post a pancreaticoduodenectomy 11/12/2021, stage IIb (pT1,pN1) ?1/11 lymph nodes, perineural invasion positive, positive margin at the SMA and SMV ?MRCP 09/27/2021-diffuse intrahepatic and common bile duct dilatation with abrupt termination at the level of the head of the pancreas, indistinct area of hypoenhancement in the pancreas head ?ERCP with placement of pancreatic duct and bile duct stents 10/04/2021 ?EUS 1 10/04/2021-no pancreas mass identified, FNA of pancreas head-"atypical "cells, no adenopathy ?CT abdomen/pelvis 10/18/2021-no focal liver abnormality, intra and extrahepatic biliary  ductal dilatation with narrowing of the common duct and the pancreas head, no enlarged abdominal lymph nodes ?Cycle 1 FOLFIRINOX 12/27/2021, irinotecan held with cycle 1 ?Cycle 2 FOLFIRINOX 01/24/2022, oxaliplatin and irinotecan dose reduced secondary to elevated liver enzymes, Udenyca ?Cycle 3 FOLFIRINOX 02/07/2022, same doses as cycle 2 ?Cycle 4 FOLFIRINOX 02/21/2022 ?Acute pancreatitis 10/04/2021 ?Bipolar disorder ?Left soleal DVT 11/26/2021-apixaban ?Port-A-Cath placement 12/22/2021  ?Atrial fibrillation 12/22/2021, converted to sinus rhythm, discharged home 12/23/2021 on metoprolol 12.5 mg twice daily, on Eliquis for prior DVT. ?Neutropenia secondary to chemotherapy 01/10/2022, Udenyca added with cycle 2 ?Elevated liver enzymes, predating chemotherapy ? ?Disposition: [name] appears well.  He has completed 3 cycles of FOLFIRINOX.  He continues to tolerate chemotherapy without significant acute toxicity.  Plan to proceed with cycle 4 today as scheduled pending chemistry panel results. ? ?CBC from today reviewed.  Counts adequate to proceed with treatment. ? ?He will return for lab, follow-up, cycle 5 FOLFIRINOX in 2 weeks.  We are available to see him sooner if needed. ? ? ? ?Ned Card ANP/GNP-BC  ? ?02/21/2022  ?8:37 AM ? ? ? ? ? ? ? ?

## 2022-02-21 NOTE — Progress Notes (Signed)
Patient presents for treatment. RN assessment completed along with the following: ? ?Labs/vitals reviewed - Yes, and Please see collab nurse note.    ?Weight within 10% of previous measurement - Yes ?Informed consent completed and reflects current therapy/intent - Yes, on date 12/27/2021             ?Provider progress note reviewed - Yes, today's provider note was reviewed. ?Treatment/Antibody/Supportive plan reviewed - Yes, and there are no adjustments needed for today's treatment. ?S&H and other orders reviewed - Yes, and there are no additional orders identified. ?Previous treatment date reviewed - Yes, and the appropriate amount of time has elapsed between treatments. ?Clinic Hand Off Received from - Yes from [place], [place]. ? ?Patient to proceed with treatment.  ? ?

## 2022-02-21 NOTE — Patient Instructions (Addendum)
Dean [name]   ?Discharge Instructions: ?Thank you for choosing [facility] to provide your oncology and hematology care.  ? ?If you have a lab appointment with the [facility], please go directly to the [facility] and check in at the registration area. ?  ?Wear comfortable clothing and clothing appropriate for easy access to any Portacath or PICC line.  ? ?We strive to give you quality time with your provider. You may need to reschedule your appointment if you arrive late (15 or more minutes).  Arriving late affects you and other patients whose appointments are after yours.  Also, if you miss three or more appointments without notifying the office, you may be dismissed from the clinic at the provider?s discretion.    ?  ?For prescription refill requests, have your pharmacy contact our office and allow 72 hours for refills to be completed.   ? ?Today you received the following chemotherapy and/or immunotherapy agents Oxaliplatin (ELOXATIN), Irinotecan (CAMPTOSAR), Leucovorin & Flourouracil (ADRUCIL).    ?  ?To help prevent nausea and vomiting after your treatment, we encourage you to take your nausea medication as directed. ? ?BELOW ARE SYMPTOMS THAT SHOULD BE REPORTED IMMEDIATELY: ?*FEVER GREATER THAN 100.4 F (38 ?C) OR HIGHER ?*CHILLS OR SWEATING ?*NAUSEA AND VOMITING THAT IS NOT CONTROLLED WITH YOUR NAUSEA MEDICATION ?*UNUSUAL SHORTNESS OF BREATH ?*UNUSUAL BRUISING OR BLEEDING ?*URINARY PROBLEMS (pain or burning when urinating, or frequent urination) ?*BOWEL PROBLEMS (unusual diarrhea, constipation, pain near the anus) ?TENDERNESS IN MOUTH AND THROAT WITH OR WITHOUT PRESENCE OF ULCERS (sore throat, sores in mouth, or a toothache) ?UNUSUAL RASH, SWELLING OR PAIN  ?UNUSUAL VAGINAL DISCHARGE OR ITCHING  ? ?Items with * indicate a potential emergency and should be followed up as soon as possible or go to the Emergency Department if any problems should occur. ? ?Please  show the CHEMOTHERAPY ALERT CARD or IMMUNOTHERAPY ALERT CARD at check-in to the Emergency Department and triage nurse. ? ?Should you have questions after your visit or need to cancel or reschedule your appointment, please contact [facility]  Dept: [phone]  and follow the prompts.  Office hours are 8:00 a.m. to 4:30 p.m. Monday - Friday. Please note that voicemails left after 4:00 p.m. may not be returned until the following business day.  We are closed weekends and major holidays. You have access to a nurse at all times for urgent questions. Please call the main number to the clinic Dept: [phone] and follow the prompts. ? ? ?For any non-urgent questions, you may also contact your provider using MyChart. We now offer e-Visits for anyone 75 and older to request care online for non-urgent symptoms. For details visit [email or web address]. ?  ?Also download the MyChart app! Go to the app store, search "MyChart", open the app, select [facility], and log in with your MyChart username and password. ? ?Due to Covid, a mask is required upon entering the hospital/clinic. If you do not have a mask, one will be given to you upon arrival. For doctor visits, patients may have 1 support person aged 73 or older with them. For treatment visits, patients cannot have anyone with them due to current Covid guidelines and our immunocompromised population.  ? ?Oxaliplatin Injection ?What is this medication? ?OXALIPLATIN (ox AL i PLA tin) is a chemotherapy drug. It targets fast dividing cells, like cancer cells, and causes these cells to die. This medicine is used to treat cancers of the colon and  rectum, and many other cancers. ?This medicine may be used for other purposes; ask your health care provider or pharmacist if you have questions. ?COMMON BRAND NAME(S): Eloxatin ?What should I tell my care team before I take this medication? ?They need to know if you have any of these conditions: ?heart  disease ?history of irregular heartbeat ?liver disease ?low blood counts, like white cells, platelets, or red blood cells ?lung or breathing disease, like asthma ?take medicines that treat or prevent blood clots ?tingling of the fingers or toes, or other nerve disorder ?an unusual or allergic reaction to oxaliplatin, other chemotherapy, other medicines, foods, dyes, or preservatives ?pregnant or trying to get pregnant ?breast-feeding ?How should I use this medication? ?This drug is given as an infusion into a vein. It is administered in a hospital or clinic by a specially trained health care professional. ?Talk to your pediatrician regarding the use of this medicine in children. Special care may be needed. ?Overdosage: If you think you have taken too much of this medicine contact a poison control center or emergency room at once. ?NOTE: This medicine is only for you. Do not share this medicine with others. ?What if I miss a dose? ?It is important not to miss a dose. Call your doctor or health care professional if you are unable to keep an appointment. ?What may interact with this medication? ?Do not take this medicine with any of the following medications: ?cisapride ?dronedarone ?pimozide ?thioridazine ?This medicine may also interact with the following medications: ?aspirin and aspirin-like medicines ?certain medicines that treat or prevent blood clots like warfarin, apixaban, dabigatran, and rivaroxaban ?cisplatin ?cyclosporine ?diuretics ?medicines for infection like acyclovir, adefovir, amphotericin B, bacitracin, cidofovir, foscarnet, ganciclovir, gentamicin, pentamidine, vancomycin ?NSAIDs, medicines for pain and inflammation, like ibuprofen or naproxen ?other medicines that prolong the QT interval (an abnormal heart rhythm) ?pamidronate ?zoledronic acid ?This list may not describe all possible interactions. Give your health care provider a list of all the medicines, herbs, non-prescription drugs, or dietary  supplements you use. Also tell them if you smoke, drink alcohol, or use illegal drugs. Some items may interact with your medicine. ?What should I watch for while using this medication? ?Your condition will be monitored carefully while you are receiving this medicine. ?You may need blood work done while you are taking this medicine. ?This medicine may make you feel generally unwell. This is not uncommon as chemotherapy can affect healthy cells as well as cancer cells. Report any side effects. Continue your course of treatment even though you feel ill unless your healthcare professional tells you to stop. ?This medicine can make you more sensitive to cold. Do not drink cold drinks or use ice. Cover exposed skin before coming in contact with cold temperatures or cold objects. When out in cold weather wear warm clothing and cover your mouth and nose to warm the air that goes into your lungs. Tell your doctor if you get sensitive to the cold. ?Do not become pregnant while taking this medicine or for 9 months after stopping it. Women should inform their health care professional if they wish to become pregnant or think they might be pregnant. Men should not father a child while taking this medicine and for 6 months after stopping it. There is potential for serious side effects to an unborn child. Talk to your health care professional for more information. ?Do not breast-feed a child while taking this medicine or for 3 months after stopping it. ?This medicine has caused ovarian   failure in some women. This medicine may make it more difficult to get pregnant. Talk to your health care professional if you are concerned about your fertility. ?This medicine has caused decreased sperm counts in some men. This may make it more difficult to father a child. Talk to your health care professional if you are concerned about your fertility. ?This medicine may increase your risk of getting an infection. Call your health care professional  for advice if you get a fever, chills, or sore throat, or other symptoms of a cold or flu. Do not treat yourself. Try to avoid being around people who are sick. ?Avoid taking medicines that contain aspirin

## 2022-02-23 ENCOUNTER — Inpatient Hospital Stay: Payer: BC Managed Care – PPO

## 2022-02-23 ENCOUNTER — Other Ambulatory Visit: Payer: Self-pay

## 2022-02-23 VITALS — BP 128/83 | HR 63 | Temp 97.9°F | Resp 18

## 2022-02-23 DIAGNOSIS — I4891 Unspecified atrial fibrillation: Secondary | ICD-10-CM | POA: Diagnosis not present

## 2022-02-23 DIAGNOSIS — C250 Malignant neoplasm of head of pancreas: Secondary | ICD-10-CM

## 2022-02-23 DIAGNOSIS — Z5189 Encounter for other specified aftercare: Secondary | ICD-10-CM | POA: Diagnosis not present

## 2022-02-23 DIAGNOSIS — R197 Diarrhea, unspecified: Secondary | ICD-10-CM | POA: Diagnosis not present

## 2022-02-23 DIAGNOSIS — F319 Bipolar disorder, unspecified: Secondary | ICD-10-CM | POA: Diagnosis not present

## 2022-02-23 DIAGNOSIS — Z86718 Personal history of other venous thrombosis and embolism: Secondary | ICD-10-CM | POA: Diagnosis not present

## 2022-02-23 DIAGNOSIS — Z5111 Encounter for antineoplastic chemotherapy: Secondary | ICD-10-CM | POA: Diagnosis not present

## 2022-02-23 DIAGNOSIS — R748 Abnormal levels of other serum enzymes: Secondary | ICD-10-CM | POA: Diagnosis not present

## 2022-02-23 DIAGNOSIS — D702 Other drug-induced agranulocytosis: Secondary | ICD-10-CM | POA: Diagnosis not present

## 2022-02-23 MED ORDER — HEPARIN SOD (PORK) LOCK FLUSH 100 UNIT/ML IV SOLN
500.0000 [IU] | Freq: Once | INTRAVENOUS | Status: AC | PRN
  Administered 2022-02-23: 500 [IU]

## 2022-02-23 MED ORDER — PEGFILGRASTIM-CBQV 6 MG/0.6ML ~~LOC~~ SOSY
6.0000 mg | PREFILLED_SYRINGE | Freq: Once | SUBCUTANEOUS | Status: AC
  Administered 2022-02-23: 6 mg via SUBCUTANEOUS
  Filled 2022-02-23: qty 0.6

## 2022-02-23 MED ORDER — SODIUM CHLORIDE 0.9% FLUSH
10.0000 mL | INTRAVENOUS | Status: DC | PRN
  Administered 2022-02-23: 10 mL

## 2022-02-23 NOTE — Patient Instructions (Signed)

## 2022-02-24 DIAGNOSIS — K831 Obstruction of bile duct: Secondary | ICD-10-CM | POA: Diagnosis not present

## 2022-03-02 ENCOUNTER — Other Ambulatory Visit: Payer: Self-pay | Admitting: Nurse Practitioner

## 2022-03-02 ENCOUNTER — Encounter: Payer: Self-pay | Admitting: Nurse Practitioner

## 2022-03-02 DIAGNOSIS — I82409 Acute embolism and thrombosis of unspecified deep veins of unspecified lower extremity: Secondary | ICD-10-CM

## 2022-03-02 MED ORDER — APIXABAN 5 MG PO TABS: 5.0000 mg | ORAL_TABLET | Freq: Two times a day (BID) | ORAL | 1 refills | Status: DC

## 2022-03-06 ENCOUNTER — Other Ambulatory Visit: Payer: Self-pay | Admitting: Oncology

## 2022-03-07 ENCOUNTER — Inpatient Hospital Stay (HOSPITAL_BASED_OUTPATIENT_CLINIC_OR_DEPARTMENT_OTHER): Payer: BC Managed Care – PPO | Admitting: Oncology

## 2022-03-07 ENCOUNTER — Encounter: Payer: Self-pay | Admitting: *Deleted

## 2022-03-07 ENCOUNTER — Inpatient Hospital Stay: Payer: BC Managed Care – PPO

## 2022-03-07 ENCOUNTER — Inpatient Hospital Stay: Payer: BC Managed Care – PPO | Attending: Oncology

## 2022-03-07 VITALS — BP 119/82 | HR 60 | Temp 97.8°F | Resp 18 | Ht 71.0 in | Wt 163.2 lb

## 2022-03-07 DIAGNOSIS — C250 Malignant neoplasm of head of pancreas: Secondary | ICD-10-CM

## 2022-03-07 DIAGNOSIS — I4891 Unspecified atrial fibrillation: Secondary | ICD-10-CM | POA: Diagnosis not present

## 2022-03-07 DIAGNOSIS — F319 Bipolar disorder, unspecified: Secondary | ICD-10-CM | POA: Diagnosis not present

## 2022-03-07 DIAGNOSIS — R197 Diarrhea, unspecified: Secondary | ICD-10-CM | POA: Diagnosis not present

## 2022-03-07 DIAGNOSIS — T451X5A Adverse effect of antineoplastic and immunosuppressive drugs, initial encounter: Secondary | ICD-10-CM | POA: Diagnosis not present

## 2022-03-07 DIAGNOSIS — D701 Agranulocytosis secondary to cancer chemotherapy: Secondary | ICD-10-CM | POA: Diagnosis not present

## 2022-03-07 DIAGNOSIS — K831 Obstruction of bile duct: Secondary | ICD-10-CM | POA: Diagnosis not present

## 2022-03-07 DIAGNOSIS — Z5189 Encounter for other specified aftercare: Secondary | ICD-10-CM | POA: Diagnosis not present

## 2022-03-07 DIAGNOSIS — Z86718 Personal history of other venous thrombosis and embolism: Secondary | ICD-10-CM | POA: Insufficient documentation

## 2022-03-07 DIAGNOSIS — Z8719 Personal history of other diseases of the digestive system: Secondary | ICD-10-CM | POA: Diagnosis not present

## 2022-03-07 DIAGNOSIS — R748 Abnormal levels of other serum enzymes: Secondary | ICD-10-CM | POA: Diagnosis not present

## 2022-03-07 DIAGNOSIS — Z5111 Encounter for antineoplastic chemotherapy: Secondary | ICD-10-CM | POA: Diagnosis not present

## 2022-03-07 LAB — CBC WITH DIFFERENTIAL (CANCER CENTER ONLY)
Abs Immature Granulocytes: 0.04 10*3/uL (ref 0.00–0.07)
Basophils Absolute: 0 10*3/uL (ref 0.0–0.1)
Basophils Relative: 1 %
Eosinophils Absolute: 0.3 10*3/uL (ref 0.0–0.5)
Eosinophils Relative: 3 %
HCT: 35.7 % — ABNORMAL LOW (ref 39.0–52.0)
Hemoglobin: 12 g/dL — ABNORMAL LOW (ref 13.0–17.0)
Immature Granulocytes: 1 %
Lymphocytes Relative: 21 %
Lymphs Abs: 1.6 10*3/uL (ref 0.7–4.0)
MCH: 33.5 pg (ref 26.0–34.0)
MCHC: 33.6 g/dL (ref 30.0–36.0)
MCV: 99.7 fL (ref 80.0–100.0)
Monocytes Absolute: 0.6 10*3/uL (ref 0.1–1.0)
Monocytes Relative: 8 %
Neutro Abs: 5 10*3/uL (ref 1.7–7.7)
Neutrophils Relative %: 66 %
Platelet Count: 149 10*3/uL — ABNORMAL LOW (ref 150–400)
RBC: 3.58 MIL/uL — ABNORMAL LOW (ref 4.22–5.81)
RDW: 15.3 % (ref 11.5–15.5)
WBC Count: 7.5 10*3/uL (ref 4.0–10.5)
nRBC: 0 % (ref 0.0–0.2)

## 2022-03-07 LAB — CMP (CANCER CENTER ONLY)
ALT: 66 U/L — ABNORMAL HIGH (ref 0–44)
AST: 34 U/L (ref 15–41)
Albumin: 4.1 g/dL (ref 3.5–5.0)
Alkaline Phosphatase: 362 U/L — ABNORMAL HIGH (ref 38–126)
Anion gap: 8 (ref 5–15)
BUN: 13 mg/dL (ref 6–20)
CO2: 27 mmol/L (ref 22–32)
Calcium: 9.8 mg/dL (ref 8.9–10.3)
Chloride: 105 mmol/L (ref 98–111)
Creatinine: 0.71 mg/dL (ref 0.61–1.24)
GFR, Estimated: >60 mL/min (ref >60)
Glucose, Bld: 131 mg/dL — ABNORMAL HIGH (ref 70–99)
Potassium: 3.9 mmol/L (ref 3.5–5.1)
Sodium: 140 mmol/L (ref 135–145)
Total Bilirubin: 0.3 mg/dL (ref 0.3–1.2)
Total Protein: 6.6 g/dL (ref 6.5–8.1)

## 2022-03-07 MED ORDER — DEXTROSE 5 % IV SOLN: Freq: Once | INTRAVENOUS | Status: AC

## 2022-03-07 MED ORDER — SODIUM CHLORIDE 0.9 % IV SOLN
2000.0000 mg/m2 | INTRAVENOUS | Status: DC
  Administered 2022-03-07: 3850 mg via INTRAVENOUS
  Filled 2022-03-07: qty 77

## 2022-03-07 MED ORDER — ATROPINE SULFATE 1 MG/ML IV SOLN
0.5000 mg | Freq: Once | INTRAVENOUS | Status: AC | PRN
  Administered 2022-03-07: 0.5 mg via INTRAVENOUS
  Filled 2022-03-07: qty 1

## 2022-03-07 MED ORDER — SODIUM CHLORIDE 0.9 % IV SOLN
10.0000 mg | Freq: Once | INTRAVENOUS | Status: AC
  Administered 2022-03-07: 10 mg via INTRAVENOUS
  Filled 2022-03-07: qty 1

## 2022-03-07 MED ORDER — PALONOSETRON HCL INJECTION 0.25 MG/5ML
0.2500 mg | Freq: Once | INTRAVENOUS | Status: AC
  Administered 2022-03-07: 0.25 mg via INTRAVENOUS
  Filled 2022-03-07: qty 5

## 2022-03-07 MED ORDER — SODIUM CHLORIDE 0.9 % IV SOLN
150.0000 mg | Freq: Once | INTRAVENOUS | Status: AC
  Administered 2022-03-07: 150 mg via INTRAVENOUS
  Filled 2022-03-07: qty 5

## 2022-03-07 MED ORDER — SODIUM CHLORIDE 0.9 % IV SOLN
400.0000 mg/m2 | Freq: Once | INTRAVENOUS | Status: AC
  Administered 2022-03-07: 772 mg via INTRAVENOUS
  Filled 2022-03-07: qty 38.6

## 2022-03-07 MED ORDER — OXALIPLATIN CHEMO INJECTION 100 MG/20ML
65.0000 mg/m2 | Freq: Once | INTRAVENOUS | Status: AC
  Administered 2022-03-07: 125 mg via INTRAVENOUS
  Filled 2022-03-07: qty 10

## 2022-03-07 MED ORDER — SODIUM CHLORIDE 0.9 % IV SOLN
120.0000 mg/m2 | Freq: Once | INTRAVENOUS | Status: AC
  Administered 2022-03-07: 240 mg via INTRAVENOUS
  Filled 2022-03-07: qty 2

## 2022-03-07 NOTE — Progress Notes (Signed)
Patient presents for treatment. RN assessment completed along with the following: ? ?Labs/vitals reviewed - Yes, and within treatment parameters.   ?Weight within 10% of previous measurement - Yes ?Informed consent completed and reflects current therapy/intent - Yes, on date 12/27/2021             ?Provider progress note reviewed - Yes, today's provider note was reviewed. ?Treatment/Antibody/Supportive plan reviewed - Yes, and there are no adjustments needed for today's treatment. ?S&H and other orders reviewed - Yes, and there are no additional orders identified. ?Previous treatment date reviewed - Yes, and the appropriate amount of time has elapsed between treatments. ?Clinic Hand Off Received from - Yes from [place], RN ? ?Patient to proceed with treatment.  ? ?

## 2022-03-07 NOTE — Progress Notes (Signed)
Patient seen by Dr. Sherrill today ? ?Vitals are within treatment parameters. ? ?Labs reviewed by Dr. Sherrill and are within treatment parameters. ? ?Per physician team, patient is ready for treatment and there are NO modifications to the treatment plan.  ?

## 2022-03-07 NOTE — Patient Instructions (Signed)
[facility]   ?Discharge Instructions: ?Thank you for choosing [facility] to provide your oncology and hematology care.  ? ?If you have a lab appointment with the Cancer Center, please go directly to the Cancer Center and check in at the registration area. ?  ?Wear comfortable clothing and clothing appropriate for easy access to any Portacath or PICC line.  ? ?We strive to give you quality time with your provider. You may need to reschedule your appointment if you arrive late (15 or more minutes).  Arriving late affects you and other patients whose appointments are after yours.  Also, if you miss three or more appointments without notifying the office, you may be dismissed from the clinic at the provider?s discretion.    ?  ?For prescription refill requests, have your pharmacy contact our office and allow 72 hours for refills to be completed.   ? ?Today you received the following chemotherapy and/or immunotherapy agents Oxaliplatin (ELOXATIN), Leucovorin, Irinotecan (CAMPTOSAR) & Flourouracil (ADRUCIL).    ?  ?To help prevent nausea and vomiting after your treatment, we encourage you to take your nausea medication as directed. ? ?BELOW ARE SYMPTOMS THAT SHOULD BE REPORTED IMMEDIATELY: ?*FEVER GREATER THAN 100.4 F (38 ?C) OR HIGHER ?*CHILLS OR SWEATING ?*NAUSEA AND VOMITING THAT IS NOT CONTROLLED WITH YOUR NAUSEA MEDICATION ?*UNUSUAL SHORTNESS OF BREATH ?*UNUSUAL BRUISING OR BLEEDING ?*URINARY PROBLEMS (pain or burning when urinating, or frequent urination) ?*BOWEL PROBLEMS (unusual diarrhea, constipation, pain near the anus) ?TENDERNESS IN MOUTH AND THROAT WITH OR WITHOUT PRESENCE OF ULCERS (sore throat, sores in mouth, or a toothache) ?UNUSUAL RASH, SWELLING OR PAIN  ?UNUSUAL VAGINAL DISCHARGE OR ITCHING  ? ?Items with * indicate a potential emergency and should be followed up as soon as possible or go to the Emergency Department if any problems should occur. ? ?Please  show the CHEMOTHERAPY ALERT CARD or IMMUNOTHERAPY ALERT CARD at check-in to the Emergency Department and triage nurse. ? ?Should you have questions after your visit or need to cancel or reschedule your appointment, please contact [facility]  Dept: [phone]  and follow the prompts.  Office hours are 8:00 a.m. to 4:30 p.m. Monday - Friday. Please note that voicemails left after 4:00 p.m. may not be returned until the following business day.  We are closed weekends and major holidays. You have access to a nurse at all times for urgent questions. Please call the main number to the clinic Dept: [phone] and follow the prompts. ? ? ?For any non-urgent questions, you may also contact your provider using MyChart. We now offer e-Visits for anyone 18 and older to request care online for non-urgent symptoms. For details visit [email or web address]. ?  ?Also download the MyChart app! Go to the app store, search "MyChart", open the app, select , and log in with your MyChart username and password. ? ?Due to Covid, a mask is required upon entering the hospital/clinic. If you do not have a mask, one will be given to you upon arrival. For doctor visits, patients may have 1 support person aged 18 or older with them. For treatment visits, patients cannot have anyone with them due to current Covid guidelines and our immunocompromised population.  ? ?Oxaliplatin Injection ?What is this medication? ?OXALIPLATIN (ox AL i PLA tin) is a chemotherapy drug. It targets fast dividing cells, like cancer cells, and causes these cells to die. This medicine is used to treat cancers of the colon and   rectum, and many other cancers. ?This medicine may be used for other purposes; ask your health care provider or pharmacist if you have questions. ?COMMON BRAND NAME(S): Eloxatin ?What should I tell my care team before I take this medication? ?They need to know if you have any of these conditions: ?heart  disease ?history of irregular heartbeat ?liver disease ?low blood counts, like white cells, platelets, or red blood cells ?lung or breathing disease, like asthma ?take medicines that treat or prevent blood clots ?tingling of the fingers or toes, or other nerve disorder ?an unusual or allergic reaction to oxaliplatin, other chemotherapy, other medicines, foods, dyes, or preservatives ?pregnant or trying to get pregnant ?breast-feeding ?How should I use this medication? ?This drug is given as an infusion into a vein. It is administered in a hospital or clinic by a specially trained health care professional. ?Talk to your pediatrician regarding the use of this medicine in children. Special care may be needed. ?Overdosage: If you think you have taken too much of this medicine contact a poison control center or emergency room at once. ?NOTE: This medicine is only for you. Do not share this medicine with others. ?What if I miss a dose? ?It is important not to miss a dose. Call your doctor or health care professional if you are unable to keep an appointment. ?What may interact with this medication? ?Do not take this medicine with any of the following medications: ?cisapride ?dronedarone ?pimozide ?thioridazine ?This medicine may also interact with the following medications: ?aspirin and aspirin-like medicines ?certain medicines that treat or prevent blood clots like warfarin, apixaban, dabigatran, and rivaroxaban ?cisplatin ?cyclosporine ?diuretics ?medicines for infection like acyclovir, adefovir, amphotericin B, bacitracin, cidofovir, foscarnet, ganciclovir, gentamicin, pentamidine, vancomycin ?NSAIDs, medicines for pain and inflammation, like ibuprofen or naproxen ?other medicines that prolong the QT interval (an abnormal heart rhythm) ?pamidronate ?zoledronic acid ?This list may not describe all possible interactions. Give your health care provider a list of all the medicines, herbs, non-prescription drugs, or dietary  supplements you use. Also tell them if you smoke, drink alcohol, or use illegal drugs. Some items may interact with your medicine. ?What should I watch for while using this medication? ?Your condition will be monitored carefully while you are receiving this medicine. ?You may need blood work done while you are taking this medicine. ?This medicine may make you feel generally unwell. This is not uncommon as chemotherapy can affect healthy cells as well as cancer cells. Report any side effects. Continue your course of treatment even though you feel ill unless your healthcare professional tells you to stop. ?This medicine can make you more sensitive to cold. Do not drink cold drinks or use ice. Cover exposed skin before coming in contact with cold temperatures or cold objects. When out in cold weather wear warm clothing and cover your mouth and nose to warm the air that goes into your lungs. Tell your doctor if you get sensitive to the cold. ?Do not become pregnant while taking this medicine or for 9 months after stopping it. Women should inform their health care professional if they wish to become pregnant or think they might be pregnant. Men should not father a child while taking this medicine and for 6 months after stopping it. There is potential for serious side effects to an unborn child. Talk to your health care professional for more information. ?Do not breast-feed a child while taking this medicine or for 3 months after stopping it. ?This medicine has caused ovarian   failure in some women. This medicine may make it more difficult to get pregnant. Talk to your health care professional if you are concerned about your fertility. ?This medicine has caused decreased sperm counts in some men. This may make it more difficult to father a child. Talk to your health care professional if you are concerned about your fertility. ?This medicine may increase your risk of getting an infection. Call your health care professional  for advice if you get a fever, chills, or sore throat, or other symptoms of a cold or flu. Do not treat yourself. Try to avoid being around people who are sick. ?Avoid taking medicines that contain aspirin

## 2022-03-07 NOTE — Progress Notes (Signed)
?  [facility] ?OFFICE PROGRESS NOTE ? ? ?Diagnosis: Pancreas cancer ? ?INTERVAL HISTORY:  ? ?Dean [name] completed another cycle of FOLFIRINOX on 02/21/2022.  No nausea/vomiting, mouth sores, or neuropathy symptoms.  He reports diarrhea for several days beginning on day 4 following chemotherapy.  The diarrhea resolved after he took Imodium. ? ?Objective: ? ?Vital signs in last 24 hours: ? ?Blood pressure 119/82, pulse 60, temperature 97.8 ?F (36.6 ?C), temperature source Oral, resp. rate 18, height '5\' 11"'$  (1.803 m), weight 163 lb 3.2 oz (74 kg), SpO2 100 %. ?  ? ?HEENT: No thrush or ulcers ?Resp: Lungs clear bilaterally ?Cardio: Regular rate and rhythm ?GI: No hepatosplenomegaly ?Vascular: No leg edema ?Skin: Palms without erythema ? ?Portacath/PICC-without erythema ? ?Lab Results: ? ?Lab Results  ?Component Value Date  ? WBC 7.5 03/07/2022  ? HGB 12.0 (L) 03/07/2022  ? HCT 35.7 (L) 03/07/2022  ? MCV 99.7 03/07/2022  ? PLT 149 (L) 03/07/2022  ? NEUTROABS 5.0 03/07/2022  ? ? ?CMP  ?Lab Results  ?Component Value Date  ? NA 140 02/21/2022  ? K 3.9 02/21/2022  ? CL 103 02/21/2022  ? CO2 27 02/21/2022  ? GLUCOSE 99 02/21/2022  ? BUN 14 02/21/2022  ? CREATININE 0.74 02/21/2022  ? CALCIUM 9.7 02/21/2022  ? PROT 6.7 02/21/2022  ? ALBUMIN 4.1 02/21/2022  ? AST 69 (H) 02/21/2022  ? ALT 87 (H) 02/21/2022  ? ALKPHOS 297 (H) 02/21/2022  ? BILITOT 0.3 02/21/2022  ? GFRNONAA >60 02/21/2022  ? GFRAA >60 08/19/2015  ? ? ?Lab Results  ?Component Value Date  ? [id number] 85 (H) 10/07/2021  ? ? ?Medications: I have reviewed the patient's current medications. ? ? ?Assessment/Plan: ? ?Pancreas cancer, status post a pancreaticoduodenectomy 11/12/2021, stage IIb (pT1,pN1) ?1/11 lymph nodes, perineural invasion positive, positive margin at the SMA and SMV ?MRCP 09/27/2021-diffuse intrahepatic and common bile duct dilatation with abrupt termination at the level of the head of the pancreas, indistinct area of hypoenhancement in the  pancreas head ?ERCP with placement of pancreatic duct and bile duct stents 10/04/2021 ?EUS 1 10/04/2021-no pancreas mass identified, FNA of pancreas head-"atypical "cells, no adenopathy ?CT abdomen/pelvis 10/18/2021-no focal liver abnormality, intra and extrahepatic biliary ductal dilatation with narrowing of the common duct and the pancreas head, no enlarged abdominal lymph nodes ?Cycle 1 FOLFIRINOX 12/27/2021, irinotecan held with cycle 1 ?Cycle 2 FOLFIRINOX 01/24/2022, oxaliplatin and irinotecan dose reduced secondary to elevated liver enzymes, Udenyca ?Cycle 3 FOLFIRINOX 02/07/2022, same doses as cycle 2 ?Cycle 4 FOLFIRINOX 02/21/2022 ?Cycle 5 FOLFIRINOX 03/07/2022 ?Acute pancreatitis 10/04/2021 ?Bipolar disorder ?Left soleal DVT 11/26/2021-apixaban ?Port-A-Cath placement 12/22/2021  ?Atrial fibrillation 12/22/2021, converted to sinus rhythm, discharged home 12/23/2021 on metoprolol 12.5 mg twice daily, on Eliquis for prior DVT. ?Neutropenia secondary to chemotherapy 01/10/2022, Udenyca added with cycle 2 ?Elevated liver enzymes, predating chemotherapy ? ? ?Disposition: ?Dean [name] appears stable.  He is tolerating the FOLFIRINOX well.  He will complete another cycle today.  He will return for an office visit and chemotherapy in 2 weeks. ? ?Dean Coder, MD ? ?03/07/2022  ?8:43 AM ? ? ?

## 2022-03-09 ENCOUNTER — Inpatient Hospital Stay: Payer: BC Managed Care – PPO

## 2022-03-09 VITALS — BP 120/60 | HR 63 | Temp 98.5°F | Resp 18

## 2022-03-09 DIAGNOSIS — C250 Malignant neoplasm of head of pancreas: Secondary | ICD-10-CM | POA: Diagnosis not present

## 2022-03-09 DIAGNOSIS — I4891 Unspecified atrial fibrillation: Secondary | ICD-10-CM | POA: Diagnosis not present

## 2022-03-09 DIAGNOSIS — D701 Agranulocytosis secondary to cancer chemotherapy: Secondary | ICD-10-CM | POA: Diagnosis not present

## 2022-03-09 DIAGNOSIS — Z5111 Encounter for antineoplastic chemotherapy: Secondary | ICD-10-CM | POA: Diagnosis not present

## 2022-03-09 DIAGNOSIS — Z86718 Personal history of other venous thrombosis and embolism: Secondary | ICD-10-CM | POA: Diagnosis not present

## 2022-03-09 DIAGNOSIS — Z8719 Personal history of other diseases of the digestive system: Secondary | ICD-10-CM | POA: Diagnosis not present

## 2022-03-09 DIAGNOSIS — R197 Diarrhea, unspecified: Secondary | ICD-10-CM | POA: Diagnosis not present

## 2022-03-09 DIAGNOSIS — R748 Abnormal levels of other serum enzymes: Secondary | ICD-10-CM | POA: Diagnosis not present

## 2022-03-09 DIAGNOSIS — T451X5A Adverse effect of antineoplastic and immunosuppressive drugs, initial encounter: Secondary | ICD-10-CM | POA: Diagnosis not present

## 2022-03-09 DIAGNOSIS — F319 Bipolar disorder, unspecified: Secondary | ICD-10-CM | POA: Diagnosis not present

## 2022-03-09 DIAGNOSIS — Z5189 Encounter for other specified aftercare: Secondary | ICD-10-CM | POA: Diagnosis not present

## 2022-03-09 MED ORDER — PEGFILGRASTIM-CBQV 6 MG/0.6ML ~~LOC~~ SOSY
6.0000 mg | PREFILLED_SYRINGE | Freq: Once | SUBCUTANEOUS | Status: AC
  Administered 2022-03-09: 6 mg via SUBCUTANEOUS
  Filled 2022-03-09: qty 0.6

## 2022-03-09 MED ORDER — HEPARIN SOD (PORK) LOCK FLUSH 100 UNIT/ML IV SOLN
500.0000 [IU] | Freq: Once | INTRAVENOUS | Status: AC | PRN
  Administered 2022-03-09: 500 [IU]

## 2022-03-09 MED ORDER — SODIUM CHLORIDE 0.9% FLUSH
10.0000 mL | INTRAVENOUS | Status: DC | PRN
  Administered 2022-03-09: 10 mL

## 2022-03-09 NOTE — Patient Instructions (Signed)

## 2022-03-10 DIAGNOSIS — L309 Dermatitis, unspecified: Secondary | ICD-10-CM | POA: Diagnosis not present

## 2022-03-17 ENCOUNTER — Ambulatory Visit: Payer: BC Managed Care – PPO | Admitting: Radiation Oncology

## 2022-03-17 ENCOUNTER — Other Ambulatory Visit: Payer: Self-pay | Admitting: Oncology

## 2022-03-22 ENCOUNTER — Inpatient Hospital Stay (HOSPITAL_BASED_OUTPATIENT_CLINIC_OR_DEPARTMENT_OTHER): Payer: BC Managed Care – PPO | Admitting: Oncology

## 2022-03-22 ENCOUNTER — Inpatient Hospital Stay: Payer: BC Managed Care – PPO

## 2022-03-22 ENCOUNTER — Ambulatory Visit: Payer: BC Managed Care – PPO | Admitting: Radiation Oncology

## 2022-03-22 ENCOUNTER — Encounter: Payer: Self-pay | Admitting: *Deleted

## 2022-03-22 ENCOUNTER — Encounter: Payer: Self-pay | Admitting: Oncology

## 2022-03-22 VITALS — BP 128/80 | HR 57 | Temp 98.1°F | Resp 18 | Ht 71.0 in | Wt 162.6 lb

## 2022-03-22 DIAGNOSIS — Z5189 Encounter for other specified aftercare: Secondary | ICD-10-CM | POA: Diagnosis not present

## 2022-03-22 DIAGNOSIS — Z8719 Personal history of other diseases of the digestive system: Secondary | ICD-10-CM | POA: Diagnosis not present

## 2022-03-22 DIAGNOSIS — C250 Malignant neoplasm of head of pancreas: Secondary | ICD-10-CM

## 2022-03-22 DIAGNOSIS — Z86718 Personal history of other venous thrombosis and embolism: Secondary | ICD-10-CM | POA: Diagnosis not present

## 2022-03-22 DIAGNOSIS — I4891 Unspecified atrial fibrillation: Secondary | ICD-10-CM | POA: Diagnosis not present

## 2022-03-22 DIAGNOSIS — K831 Obstruction of bile duct: Secondary | ICD-10-CM | POA: Diagnosis not present

## 2022-03-22 DIAGNOSIS — D701 Agranulocytosis secondary to cancer chemotherapy: Secondary | ICD-10-CM | POA: Diagnosis not present

## 2022-03-22 DIAGNOSIS — F319 Bipolar disorder, unspecified: Secondary | ICD-10-CM | POA: Diagnosis not present

## 2022-03-22 DIAGNOSIS — T451X5A Adverse effect of antineoplastic and immunosuppressive drugs, initial encounter: Secondary | ICD-10-CM | POA: Diagnosis not present

## 2022-03-22 DIAGNOSIS — R748 Abnormal levels of other serum enzymes: Secondary | ICD-10-CM | POA: Diagnosis not present

## 2022-03-22 DIAGNOSIS — Z5111 Encounter for antineoplastic chemotherapy: Secondary | ICD-10-CM | POA: Diagnosis not present

## 2022-03-22 DIAGNOSIS — R197 Diarrhea, unspecified: Secondary | ICD-10-CM | POA: Diagnosis not present

## 2022-03-22 LAB — CMP (CANCER CENTER ONLY)
ALT: 65 U/L — ABNORMAL HIGH (ref 0–44)
AST: 52 U/L — ABNORMAL HIGH (ref 15–41)
Albumin: 4.2 g/dL (ref 3.5–5.0)
Alkaline Phosphatase: 296 U/L — ABNORMAL HIGH (ref 38–126)
Anion gap: 9 (ref 5–15)
BUN: 11 mg/dL (ref 6–20)
CO2: 28 mmol/L (ref 22–32)
Calcium: 10.1 mg/dL (ref 8.9–10.3)
Chloride: 105 mmol/L (ref 98–111)
Creatinine: 0.76 mg/dL (ref 0.61–1.24)
GFR, Estimated: >60 mL/min (ref >60)
Glucose, Bld: 113 mg/dL — ABNORMAL HIGH (ref 70–99)
Potassium: 3.7 mmol/L (ref 3.5–5.1)
Sodium: 142 mmol/L (ref 135–145)
Total Bilirubin: 0.4 mg/dL (ref 0.3–1.2)
Total Protein: 6.5 g/dL (ref 6.5–8.1)

## 2022-03-22 LAB — CBC WITH DIFFERENTIAL (CANCER CENTER ONLY)
Abs Immature Granulocytes: 0.08 10*3/uL — ABNORMAL HIGH (ref 0.00–0.07)
Basophils Absolute: 0 10*3/uL (ref 0.0–0.1)
Basophils Relative: 0 %
Eosinophils Absolute: 0.1 10*3/uL (ref 0.0–0.5)
Eosinophils Relative: 2 %
HCT: 36.4 % — ABNORMAL LOW (ref 39.0–52.0)
Hemoglobin: 12.1 g/dL — ABNORMAL LOW (ref 13.0–17.0)
Immature Granulocytes: 1 %
Lymphocytes Relative: 24 %
Lymphs Abs: 1.8 10*3/uL (ref 0.7–4.0)
MCH: 33.7 pg (ref 26.0–34.0)
MCHC: 33.2 g/dL (ref 30.0–36.0)
MCV: 101.4 fL — ABNORMAL HIGH (ref 80.0–100.0)
Monocytes Absolute: 0.5 10*3/uL (ref 0.1–1.0)
Monocytes Relative: 7 %
Neutro Abs: 5 10*3/uL (ref 1.7–7.7)
Neutrophils Relative %: 66 %
Platelet Count: 132 10*3/uL — ABNORMAL LOW (ref 150–400)
RBC: 3.59 MIL/uL — ABNORMAL LOW (ref 4.22–5.81)
RDW: 15.5 % (ref 11.5–15.5)
WBC Count: 7.5 10*3/uL (ref 4.0–10.5)
nRBC: 0 % (ref 0.0–0.2)

## 2022-03-22 MED ORDER — PALONOSETRON HCL INJECTION 0.25 MG/5ML
0.2500 mg | Freq: Once | INTRAVENOUS | Status: AC
  Administered 2022-03-22: 0.25 mg via INTRAVENOUS
  Filled 2022-03-22: qty 5

## 2022-03-22 MED ORDER — SODIUM CHLORIDE 0.9 % IV SOLN
2000.0000 mg/m2 | INTRAVENOUS | Status: DC
  Administered 2022-03-22: 3850 mg via INTRAVENOUS
  Filled 2022-03-22: qty 77

## 2022-03-22 MED ORDER — SODIUM CHLORIDE 0.9 % IV SOLN
150.0000 mg | Freq: Once | INTRAVENOUS | Status: AC
  Administered 2022-03-22: 150 mg via INTRAVENOUS
  Filled 2022-03-22: qty 5

## 2022-03-22 MED ORDER — ATROPINE SULFATE 1 MG/ML IV SOLN
0.5000 mg | Freq: Once | INTRAVENOUS | Status: AC | PRN
  Administered 2022-03-22: 0.5 mg via INTRAVENOUS
  Filled 2022-03-22: qty 1

## 2022-03-22 MED ORDER — SODIUM CHLORIDE 0.9 % IV SOLN
120.0000 mg/m2 | Freq: Once | INTRAVENOUS | Status: AC
  Administered 2022-03-22: 240 mg via INTRAVENOUS
  Filled 2022-03-22: qty 10

## 2022-03-22 MED ORDER — OXALIPLATIN CHEMO INJECTION 100 MG/20ML
65.0000 mg/m2 | Freq: Once | INTRAVENOUS | Status: AC
  Administered 2022-03-22: 125 mg via INTRAVENOUS
  Filled 2022-03-22: qty 20

## 2022-03-22 MED ORDER — DEXTROSE 5 % IV SOLN: Freq: Once | INTRAVENOUS | Status: AC

## 2022-03-22 MED ORDER — SODIUM CHLORIDE 0.9 % IV SOLN
10.0000 mg | Freq: Once | INTRAVENOUS | Status: AC
  Administered 2022-03-22: 10 mg via INTRAVENOUS
  Filled 2022-03-22: qty 1

## 2022-03-22 MED ORDER — SODIUM CHLORIDE 0.9 % IV SOLN
400.0000 mg/m2 | Freq: Once | INTRAVENOUS | Status: AC
  Administered 2022-03-22: 772 mg via INTRAVENOUS
  Filled 2022-03-22: qty 38.6

## 2022-03-22 NOTE — Patient Instructions (Signed)
[facility]   ?Discharge Instructions: ?Thank you for choosing [facility] to provide your oncology and hematology care.  ? ?If you have a lab appointment with the [facility], please go directly to the [facility] and check in at the registration area. ?  ?Wear comfortable clothing and clothing appropriate for easy access to any Portacath or PICC line.  ? ?We strive to give you quality time with your provider. You may need to reschedule your appointment if you arrive late (15 or more minutes).  Arriving late affects you and other patients whose appointments are after yours.  Also, if you miss three or more appointments without notifying the office, you may be dismissed from the clinic at the provider?s discretion.    ?  ?For prescription refill requests, have your pharmacy contact our office and allow 72 hours for refills to be completed.   ? ?Today you received the following chemotherapy and/or immunotherapy agents Oxaliplatin (ELOXATIN), Irinotecan (Camptosar), Leucovorin & Flourouracil (ADRUCIL).     ?  ?To help prevent nausea and vomiting after your treatment, we encourage you to take your nausea medication as directed. ? ?BELOW ARE SYMPTOMS THAT SHOULD BE REPORTED IMMEDIATELY: ?*FEVER GREATER THAN 100.4 F (38 ?C) OR HIGHER ?*CHILLS OR SWEATING ?*NAUSEA AND VOMITING THAT IS NOT CONTROLLED WITH YOUR NAUSEA MEDICATION ?*UNUSUAL SHORTNESS OF BREATH ?*UNUSUAL BRUISING OR BLEEDING ?*URINARY PROBLEMS (pain or burning when urinating, or frequent urination) ?*BOWEL PROBLEMS (unusual diarrhea, constipation, pain near the anus) ?TENDERNESS IN MOUTH AND THROAT WITH OR WITHOUT PRESENCE OF ULCERS (sore throat, sores in mouth, or a toothache) ?UNUSUAL RASH, SWELLING OR PAIN  ?UNUSUAL VAGINAL DISCHARGE OR ITCHING  ? ?Items with * indicate a potential emergency and should be followed up as soon as possible or go to the Emergency Department if any problems should occur. ? ?Please  show the CHEMOTHERAPY ALERT CARD or IMMUNOTHERAPY ALERT CARD at check-in to the Emergency Department and triage nurse. ? ?Should you have questions after your visit or need to cancel or reschedule your appointment, please contact [facility]  Dept: [phone]  and follow the prompts.  Office hours are 8:00 a.m. to 4:30 p.m. Monday - Friday. Please note that voicemails left after 4:00 p.m. may not be returned until the following business day.  We are closed weekends and major holidays. You have access to a nurse at all times for urgent questions. Please call the main number to the clinic Dept: [phone] and follow the prompts. ? ? ?For any non-urgent questions, you may also contact your provider using MyChart. We now offer e-Visits for anyone 60 and older to request care online for non-urgent symptoms. For details visit [email or web address]. ?  ?Also download the MyChart app! Go to the app store, search "MyChart", open the app, select [facility], and log in with your MyChart username and password. ? ?Due to Covid, a mask is required upon entering the hospital/clinic. If you do not have a mask, one will be given to you upon arrival. For doctor visits, patients may have 1 support person aged 56 or older with them. For treatment visits, patients cannot have anyone with them due to current Covid guidelines and our immunocompromised population.  ? ?Oxaliplatin Injection ?What is this medication? ?OXALIPLATIN (ox AL i PLA tin) is a chemotherapy drug. It targets fast dividing cells, like cancer cells, and causes these cells to die. This medicine is used to treat cancers of the colon  and rectum, and many other cancers. ?This medicine may be used for other purposes; ask your health care provider or pharmacist if you have questions. ?COMMON BRAND NAME(S): Eloxatin ?What should I tell my care team before I take this medication? ?They need to know if you have any of these conditions: ?heart  disease ?history of irregular heartbeat ?liver disease ?low blood counts, like white cells, platelets, or red blood cells ?lung or breathing disease, like asthma ?take medicines that treat or prevent blood clots ?tingling of the fingers or toes, or other nerve disorder ?an unusual or allergic reaction to oxaliplatin, other chemotherapy, other medicines, foods, dyes, or preservatives ?pregnant or trying to get pregnant ?breast-feeding ?How should I use this medication? ?This drug is given as an infusion into a vein. It is administered in a hospital or clinic by a specially trained health care professional. ?Talk to your pediatrician regarding the use of this medicine in children. Special care may be needed. ?Overdosage: If you think you have taken too much of this medicine contact a poison control center or emergency room at once. ?NOTE: This medicine is only for you. Do not share this medicine with others. ?What if I miss a dose? ?It is important not to miss a dose. Call your doctor or health care professional if you are unable to keep an appointment. ?What may interact with this medication? ?Do not take this medicine with any of the following medications: ?cisapride ?dronedarone ?pimozide ?thioridazine ?This medicine may also interact with the following medications: ?aspirin and aspirin-like medicines ?certain medicines that treat or prevent blood clots like warfarin, apixaban, dabigatran, and rivaroxaban ?cisplatin ?cyclosporine ?diuretics ?medicines for infection like acyclovir, adefovir, amphotericin B, bacitracin, cidofovir, foscarnet, ganciclovir, gentamicin, pentamidine, vancomycin ?NSAIDs, medicines for pain and inflammation, like ibuprofen or naproxen ?other medicines that prolong the QT interval (an abnormal heart rhythm) ?pamidronate ?zoledronic acid ?This list may not describe all possible interactions. Give your health care provider a list of all the medicines, herbs, non-prescription drugs, or dietary  supplements you use. Also tell them if you smoke, drink alcohol, or use illegal drugs. Some items may interact with your medicine. ?What should I watch for while using this medication? ?Your condition will be monitored carefully while you are receiving this medicine. ?You may need blood work done while you are taking this medicine. ?This medicine may make you feel generally unwell. This is not uncommon as chemotherapy can affect healthy cells as well as cancer cells. Report any side effects. Continue your course of treatment even though you feel ill unless your healthcare professional tells you to stop. ?This medicine can make you more sensitive to cold. Do not drink cold drinks or use ice. Cover exposed skin before coming in contact with cold temperatures or cold objects. When out in cold weather wear warm clothing and cover your mouth and nose to warm the air that goes into your lungs. Tell your doctor if you get sensitive to the cold. ?Do not become pregnant while taking this medicine or for 9 months after stopping it. Women should inform their health care professional if they wish to become pregnant or think they might be pregnant. Men should not father a child while taking this medicine and for 6 months after stopping it. There is potential for serious side effects to an unborn child. Talk to your health care professional for more information. ?Do not breast-feed a child while taking this medicine or for 3 months after stopping it. ?This medicine has caused  ovarian failure in some women. This medicine may make it more difficult to get pregnant. Talk to your health care professional if you are concerned about your fertility. ?This medicine has caused decreased sperm counts in some men. This may make it more difficult to father a child. Talk to your health care professional if you are concerned about your fertility. ?This medicine may increase your risk of getting an infection. Call your health care professional  for advice if you get a fever, chills, or sore throat, or other symptoms of a cold or flu. Do not treat yourself. Try to avoid being around people who are sick. ?Avoid taking medicines that contain aspiri

## 2022-03-22 NOTE — Progress Notes (Signed)
?  [name] ?OFFICE PROGRESS NOTE ? ? ?Diagnosis: Pancreas cancer ? ?INTERVAL HISTORY:  ? ?[name] completed another cycle of FOLFIRINOX on 03/07/2022.  He reports 2 episodes of diarrhea following chemotherapy.  No mouth sores, nausea, or neuropathy symptoms.  He feels well.  He plans to return to work on 04/04/2022. ? ?Objective: ? ?Vital signs in last 24 hours: ? ?Blood pressure 128/80, pulse (!) 57, temperature 98.1 ?F (36.7 ?C), temperature source Oral, resp. rate 18, height '5\' 11"'$  (1.803 m), weight 162 lb 9.6 oz (73.8 kg), SpO2 100 %. ?  ? ?HEENT: No thrush or ulcers ?Resp: Lungs clear bilaterally ?Cardio: Regular rate and rhythm ?GI: No hepatosplenomegaly, nontender ?Vascular: No leg edema  ?Skin: Palms without erythema ? ?Portacath/PICC-without erythema ? ?Lab Results: ? ?Lab Results  ?Component Value Date  ? WBC 7.5 03/07/2022  ? HGB 12.0 (L) 03/07/2022  ? HCT 35.7 (L) 03/07/2022  ? MCV 99.7 03/07/2022  ? PLT 149 (L) 03/07/2022  ? NEUTROABS 5.0 03/07/2022  ? ? ?CMP  ?Lab Results  ?Component Value Date  ? NA 140 03/07/2022  ? K 3.9 03/07/2022  ? CL 105 03/07/2022  ? CO2 27 03/07/2022  ? GLUCOSE 131 (H) 03/07/2022  ? BUN 13 03/07/2022  ? CREATININE 0.71 03/07/2022  ? CALCIUM 9.8 03/07/2022  ? PROT 6.6 03/07/2022  ? ALBUMIN 4.1 03/07/2022  ? AST 34 03/07/2022  ? ALT 66 (H) 03/07/2022  ? ALKPHOS 362 (H) 03/07/2022  ? BILITOT 0.3 03/07/2022  ? GFRNONAA >60 03/07/2022  ? GFRAA >60 08/19/2015  ? ? ?Lab Results  ?Component Value Date  ? [id number] 85 (H) 10/07/2021  ? ? ?Medications: I have reviewed the patient's current medications. ? ? ?Assessment/Plan: ?Pancreas cancer, status post a pancreaticoduodenectomy 11/12/2021, stage IIb (pT1,pN1) ?1/11 lymph nodes, perineural invasion positive, positive margin at the SMA and SMV ?MRCP 09/27/2021-diffuse intrahepatic and common bile duct dilatation with abrupt termination at the level of the head of the pancreas, indistinct area of hypoenhancement in the  pancreas head ?ERCP with placement of pancreatic duct and bile duct stents 10/04/2021 ?EUS 1 10/04/2021-no pancreas mass identified, FNA of pancreas head-"atypical "cells, no adenopathy ?CT abdomen/pelvis 10/18/2021-no focal liver abnormality, intra and extrahepatic biliary ductal dilatation with narrowing of the common duct and the pancreas head, no enlarged abdominal lymph nodes ?Cycle 1 FOLFIRINOX 12/27/2021, irinotecan held with cycle 1 ?Cycle 2 FOLFIRINOX 01/24/2022, oxaliplatin and irinotecan dose reduced secondary to elevated liver enzymes, Udenyca ?Cycle 3 FOLFIRINOX 02/07/2022, same doses as cycle 2 ?Cycle 4 FOLFIRINOX 02/21/2022 ?Cycle 5 FOLFIRINOX 03/07/2022 ?Cycle 6 FOLFIRINOX 03/22/2022 ?Acute pancreatitis 10/04/2021 ?Bipolar disorder ?Left soleal DVT 11/26/2021-apixaban ?Port-A-Cath placement 12/22/2021  ?Atrial fibrillation 12/22/2021, converted to sinus rhythm, discharged home 12/23/2021 on metoprolol 12.5 mg twice daily, on Eliquis for prior DVT. ?Neutropenia secondary to chemotherapy 01/10/2022, Udenyca added with cycle 2 ?Elevated liver enzymes, predating chemotherapy ? ? ? ? ?Disposition: ? Mr Martinek has completed 5 cycles of FOLFIRINOX.  He has tolerated the chemotherapy well.  He will complete cycle 6 today.  The plan is to complete a total of 8 cycles of FOLFIRINOX prior to concurrent capecitabine and radiation. ? ?He will return for an office visit and chemotherapy in 2 weeks. ? ?Betsy Coder, MD ? ?03/22/2022  ?8:43 AM ? ? ?

## 2022-03-22 NOTE — Progress Notes (Signed)
MD wrote note on prescription pad for patient "Dean [name] can return to work 04/04/22 with no restrictions. He will need to miss work for office visits and treatment". ?Patient was give this note. ?

## 2022-03-22 NOTE — Progress Notes (Signed)
Patient presents for treatment. RN assessment completed along with the following: ? ?Labs/vitals reviewed - Yes, and within treatment parameters.   ?Weight within 10% of previous measurement - Yes ?Informed consent completed and reflects current therapy/intent - Yes, on date 12/27/2021             ?Provider progress note reviewed - Yes, today's provider note was reviewed. ?Treatment/Antibody/Supportive plan reviewed - Yes, and there are no adjustments needed for today's treatment. ?S&H and other orders reviewed - Yes, and there are no additional orders identified. ?Previous treatment date reviewed - Yes, and the appropriate amount of time has elapsed between treatments. ?Clinic Hand Off Received from - Yes from [place], RN ? ?Patient to proceed with treatment.  ? ?

## 2022-03-22 NOTE — Progress Notes (Signed)
Patient seen by Dr. Sherrill today ? ?Vitals are within treatment parameters. ? ?Labs reviewed by Dr. Sherrill and are within treatment parameters. ? ?Per physician team, patient is ready for treatment and there are NO modifications to the treatment plan.  ?

## 2022-03-23 ENCOUNTER — Ambulatory Visit: Payer: BC Managed Care – PPO | Admitting: Radiation Oncology

## 2022-03-24 ENCOUNTER — Telehealth: Payer: Self-pay

## 2022-03-24 ENCOUNTER — Inpatient Hospital Stay: Payer: BC Managed Care – PPO

## 2022-03-24 VITALS — BP 126/78 | HR 64 | Temp 98.2°F | Resp 18

## 2022-03-24 DIAGNOSIS — C250 Malignant neoplasm of head of pancreas: Secondary | ICD-10-CM | POA: Diagnosis not present

## 2022-03-24 DIAGNOSIS — Z8719 Personal history of other diseases of the digestive system: Secondary | ICD-10-CM | POA: Diagnosis not present

## 2022-03-24 DIAGNOSIS — D701 Agranulocytosis secondary to cancer chemotherapy: Secondary | ICD-10-CM | POA: Diagnosis not present

## 2022-03-24 DIAGNOSIS — T451X5A Adverse effect of antineoplastic and immunosuppressive drugs, initial encounter: Secondary | ICD-10-CM | POA: Diagnosis not present

## 2022-03-24 DIAGNOSIS — Z86718 Personal history of other venous thrombosis and embolism: Secondary | ICD-10-CM | POA: Diagnosis not present

## 2022-03-24 DIAGNOSIS — Z5111 Encounter for antineoplastic chemotherapy: Secondary | ICD-10-CM | POA: Diagnosis not present

## 2022-03-24 DIAGNOSIS — R748 Abnormal levels of other serum enzymes: Secondary | ICD-10-CM | POA: Diagnosis not present

## 2022-03-24 DIAGNOSIS — I4891 Unspecified atrial fibrillation: Secondary | ICD-10-CM | POA: Diagnosis not present

## 2022-03-24 DIAGNOSIS — F319 Bipolar disorder, unspecified: Secondary | ICD-10-CM | POA: Diagnosis not present

## 2022-03-24 DIAGNOSIS — Z5189 Encounter for other specified aftercare: Secondary | ICD-10-CM | POA: Diagnosis not present

## 2022-03-24 DIAGNOSIS — R197 Diarrhea, unspecified: Secondary | ICD-10-CM | POA: Diagnosis not present

## 2022-03-24 MED ORDER — SODIUM CHLORIDE 0.9% FLUSH
10.0000 mL | INTRAVENOUS | Status: DC | PRN
  Administered 2022-03-24: 10 mL

## 2022-03-24 MED ORDER — HEPARIN SOD (PORK) LOCK FLUSH 100 UNIT/ML IV SOLN
500.0000 [IU] | Freq: Once | INTRAVENOUS | Status: AC | PRN
  Administered 2022-03-24: 500 [IU]

## 2022-03-24 MED ORDER — PEGFILGRASTIM-CBQV 6 MG/0.6ML ~~LOC~~ SOSY
6.0000 mg | PREFILLED_SYRINGE | Freq: Once | SUBCUTANEOUS | Status: AC
  Administered 2022-03-24: 6 mg via SUBCUTANEOUS

## 2022-03-24 NOTE — Patient Instructions (Signed)

## 2022-03-24 NOTE — Telephone Encounter (Signed)
V/M message from [name] [phone] at Pt's work establishment stating she would like to speak with someone about Pt's work note. Left voice mail to return call. ?

## 2022-03-27 DIAGNOSIS — K831 Obstruction of bile duct: Secondary | ICD-10-CM | POA: Diagnosis not present

## 2022-03-28 ENCOUNTER — Other Ambulatory Visit: Payer: Self-pay | Admitting: *Deleted

## 2022-03-28 DIAGNOSIS — C250 Malignant neoplasm of head of pancreas: Secondary | ICD-10-CM

## 2022-03-29 ENCOUNTER — Encounter: Payer: Self-pay | Admitting: Licensed Clinical Social Worker

## 2022-03-29 NOTE — Progress Notes (Signed)
Dean Dean [name]  ?Initial Assessment ? ? ?Dean Dean [name] is a 56 y.o. year old male contacted by phone. Clinical Social Dean [name] was referred by nurse navigator for assessment of psychosocial needs. CSW spoke with patient's spouse and main caregiver Dean Dean [name],Dean Dean [name] (Spouse) [phone]. ? ?SDOH (Social Determinants of Health) assessments performed: Yes ?SDOH Interventions   ? ?Flowsheet Row Most Recent Value  ?SDOH Interventions   ?Food Insecurity Interventions Intervention Not Indicated  ?Financial Strain Interventions Intervention Not Indicated  ?Housing Interventions Intervention Not Indicated  ?Intimate Partner Violence Interventions Intervention Not Indicated  ?Physical Activity Interventions Intervention Not Indicated  ?Stress Interventions Intervention Not Indicated, Provide Counseling  ?Social Connections Interventions Intervention Not Indicated  ?Transportation Interventions Intervention Not Indicated, Patient Resources (Friends/Family)  ? ?  ?  ?Distress Screen completed: Yes ? ?  12/16/2021  ?  9:39 AM  ?ONCBCN DISTRESS SCREENING  ?Screening Type Initial Screening  ?Distress experienced in past week (1-10) 3  ?Practical problem type Dean [name]/school  ?Physical Problem type Nausea/vomiting  ?Other Contact via cellphone  ? ? ? ? ?Family/Social Information:  ?Housing Arrangement: patient lives with spouse . ?Family members/support persons in your life? Family, Medical Staff, and Friends/Colleagues ?Transportation concerns: no  ?Employment: Out on Dean [name] excuse. Income source: Short-Term Disability ?Financial concerns: Yes, current concerns, patient has been given "al clear" to resume Dean [name] next week. ?Type of concern:  general financial concerns ?Food access concerns: no ?Religious or spiritual practice: yes ?Services Currently in place:  Short-tern disability, BCBS PPO ?Coping/ Adjustment to diagnosis: ?Patient understands treatment plan and what happens next? yes ?Concerns about diagnosis and/or  treatment: Afraid of cancer and Relationship with husband or partner ?Patient reported stressors: Dean [name]/ school, Finances, Anxiety, and Adjusting to my illness ?Hopes and priorities: Returning to Dean [name] ?Patient enjoys being outside, watching TV, and time with family/ friends ?Current coping skills/ strengths: Ability for insight , Active sense of humor , Average or above average intelligence , Capable of independent living , Communication skills , Financial means , Motivation for treatment/growth , Physical Health , Supportive family/friends , and Dean [name] skills  ? ? ? SUMMARY: ?Current SDOH Barriers:  ?Financial constraints related to not working for the past six months. ? ?Clinical Social Dean [name] Clinical Goal(s):  ?patient will Dean [name] with SW to address concerns related to adjustment concerns ? ?Interventions: ?Discussed common feeling and emotions when being diagnosed with cancer, and the importance of support during treatment ?Informed patient of the support team roles and support services at [facility] ?Provided CSW contact information and encouraged patient to call with any questions or concerns ?Referred patient to [facility] at [facility] and Provided patient with information about CSW role in patient care and resources available.  CSW scheduled appointment  on 04/05/2022 @ 9:00AM, at [place], with patient's spouse to discuss adjustment issues and will send calendar information to her at Dean Dean [name] ? ? ?Follow Up Plan:  CSW will meet with caregiver next week. ?Patient verbalizes understanding of plan: Yes ? ? ? ?Dean Knotts, LCSW ?

## 2022-04-03 ENCOUNTER — Other Ambulatory Visit: Payer: Self-pay | Admitting: Oncology

## 2022-04-05 ENCOUNTER — Inpatient Hospital Stay: Payer: BC Managed Care – PPO

## 2022-04-05 ENCOUNTER — Inpatient Hospital Stay: Payer: BC Managed Care – PPO | Attending: Oncology

## 2022-04-05 ENCOUNTER — Encounter: Payer: Self-pay | Admitting: *Deleted

## 2022-04-05 ENCOUNTER — Encounter: Payer: Self-pay | Admitting: Nurse Practitioner

## 2022-04-05 ENCOUNTER — Encounter: Payer: Self-pay | Admitting: Licensed Clinical Social Worker

## 2022-04-05 ENCOUNTER — Inpatient Hospital Stay (HOSPITAL_BASED_OUTPATIENT_CLINIC_OR_DEPARTMENT_OTHER): Payer: BC Managed Care – PPO | Admitting: Nurse Practitioner

## 2022-04-05 VITALS — BP 124/81 | HR 88 | Temp 98.2°F | Resp 18 | Ht 71.0 in | Wt 161.2 lb

## 2022-04-05 DIAGNOSIS — C250 Malignant neoplasm of head of pancreas: Secondary | ICD-10-CM

## 2022-04-05 DIAGNOSIS — R000 Tachycardia, unspecified: Secondary | ICD-10-CM | POA: Insufficient documentation

## 2022-04-05 DIAGNOSIS — K859 Acute pancreatitis without necrosis or infection, unspecified: Secondary | ICD-10-CM | POA: Insufficient documentation

## 2022-04-05 DIAGNOSIS — Z5111 Encounter for antineoplastic chemotherapy: Secondary | ICD-10-CM | POA: Diagnosis not present

## 2022-04-05 DIAGNOSIS — Z5189 Encounter for other specified aftercare: Secondary | ICD-10-CM | POA: Insufficient documentation

## 2022-04-05 DIAGNOSIS — K831 Obstruction of bile duct: Secondary | ICD-10-CM | POA: Diagnosis not present

## 2022-04-05 LAB — CBC WITH DIFFERENTIAL (CANCER CENTER ONLY)
Abs Immature Granulocytes: 0.05 10*3/uL (ref 0.00–0.07)
Basophils Absolute: 0 10*3/uL (ref 0.0–0.1)
Basophils Relative: 0 %
Eosinophils Absolute: 0.2 10*3/uL (ref 0.0–0.5)
Eosinophils Relative: 2 %
HCT: 35.3 % — ABNORMAL LOW (ref 39.0–52.0)
Hemoglobin: 11.8 g/dL — ABNORMAL LOW (ref 13.0–17.0)
Immature Granulocytes: 1 %
Lymphocytes Relative: 22 %
Lymphs Abs: 1.9 10*3/uL (ref 0.7–4.0)
MCH: 34.6 pg — ABNORMAL HIGH (ref 26.0–34.0)
MCHC: 33.4 g/dL (ref 30.0–36.0)
MCV: 103.5 fL — ABNORMAL HIGH (ref 80.0–100.0)
Monocytes Absolute: 0.7 10*3/uL (ref 0.1–1.0)
Monocytes Relative: 8 %
Neutro Abs: 5.9 10*3/uL (ref 1.7–7.7)
Neutrophils Relative %: 67 %
Platelet Count: 136 10*3/uL — ABNORMAL LOW (ref 150–400)
RBC: 3.41 MIL/uL — ABNORMAL LOW (ref 4.22–5.81)
RDW: 15.6 % — ABNORMAL HIGH (ref 11.5–15.5)
WBC Count: 8.7 10*3/uL (ref 4.0–10.5)
nRBC: 0 % (ref 0.0–0.2)

## 2022-04-05 LAB — CMP (CANCER CENTER ONLY)
ALT: 78 U/L — ABNORMAL HIGH (ref 0–44)
AST: 44 U/L — ABNORMAL HIGH (ref 15–41)
Albumin: 4.2 g/dL (ref 3.5–5.0)
Alkaline Phosphatase: 402 U/L — ABNORMAL HIGH (ref 38–126)
Anion gap: 9 (ref 5–15)
BUN: 13 mg/dL (ref 6–20)
CO2: 27 mmol/L (ref 22–32)
Calcium: 9.5 mg/dL (ref 8.9–10.3)
Chloride: 104 mmol/L (ref 98–111)
Creatinine: 0.72 mg/dL (ref 0.61–1.24)
GFR, Estimated: >60 mL/min (ref >60)
Glucose, Bld: 99 mg/dL (ref 70–99)
Potassium: 3.8 mmol/L (ref 3.5–5.1)
Sodium: 140 mmol/L (ref 135–145)
Total Bilirubin: 0.5 mg/dL (ref 0.3–1.2)
Total Protein: 6.6 g/dL (ref 6.5–8.1)

## 2022-04-05 MED ORDER — SODIUM CHLORIDE 0.9 % IV SOLN
150.0000 mg | Freq: Once | INTRAVENOUS | Status: AC
  Administered 2022-04-05: 150 mg via INTRAVENOUS
  Filled 2022-04-05: qty 5

## 2022-04-05 MED ORDER — PALONOSETRON HCL INJECTION 0.25 MG/5ML
0.2500 mg | Freq: Once | INTRAVENOUS | Status: AC
  Administered 2022-04-05: 0.25 mg via INTRAVENOUS
  Filled 2022-04-05: qty 5

## 2022-04-05 MED ORDER — SODIUM CHLORIDE 0.9 % IV SOLN
10.0000 mg | Freq: Once | INTRAVENOUS | Status: AC
  Administered 2022-04-05: 10 mg via INTRAVENOUS
  Filled 2022-04-05: qty 1

## 2022-04-05 MED ORDER — OXALIPLATIN CHEMO INJECTION 100 MG/20ML
65.0000 mg/m2 | Freq: Once | INTRAVENOUS | Status: AC
  Administered 2022-04-05: 125 mg via INTRAVENOUS
  Filled 2022-04-05: qty 20

## 2022-04-05 MED ORDER — ATROPINE SULFATE 1 MG/ML IV SOLN
0.5000 mg | Freq: Once | INTRAVENOUS | Status: AC | PRN
  Administered 2022-04-05: 0.5 mg via INTRAVENOUS
  Filled 2022-04-05: qty 1

## 2022-04-05 MED ORDER — SODIUM CHLORIDE 0.9 % IV SOLN
400.0000 mg/m2 | Freq: Once | INTRAVENOUS | Status: AC
  Administered 2022-04-05: 772 mg via INTRAVENOUS
  Filled 2022-04-05: qty 38.6

## 2022-04-05 MED ORDER — DEXTROSE 5 % IV SOLN: Freq: Once | INTRAVENOUS | Status: AC

## 2022-04-05 MED ORDER — SODIUM CHLORIDE 0.9 % IV SOLN
120.0000 mg/m2 | Freq: Once | INTRAVENOUS | Status: AC
  Administered 2022-04-05: 240 mg via INTRAVENOUS
  Filled 2022-04-05: qty 12

## 2022-04-05 MED ORDER — SODIUM CHLORIDE 0.9 % IV SOLN
2000.0000 mg/m2 | INTRAVENOUS | Status: DC
  Administered 2022-04-05: 3850 mg via INTRAVENOUS
  Filled 2022-04-05: qty 77

## 2022-04-05 NOTE — Progress Notes (Signed)
?  [facility] ?OFFICE PROGRESS NOTE ? ? ?Diagnosis: Pancreas cancer ? ?INTERVAL HISTORY:  ? ?Mr. Cannan returns as scheduled.  He completed cycle 6 FOLFIRINOX 03/22/2022.  He has mild intermittent nausea.  No vomiting.  No mouth sores.  No recent diarrhea.  He does not experience cold sensitivity.  No numbness or tingling unrelated to cold exposure. ? ?He reports a few episodes of tachycardia.  After about 10 minutes it resolves.  This has not occurred recently. ? ?Objective: ? ?Vital signs in last 24 hours: ? ?Blood pressure 124/81, pulse 88, temperature 98.2 ?F (36.8 ?C), temperature source Oral, resp. rate 18, height '5\' 11"'$  (1.803 m), weight 161 lb 3.2 oz (73.1 kg), SpO2 100 %. ?  ? ?HEENT: No thrush or ulcers. ?Resp: Lungs clear bilaterally. ?Cardio: Regular rate and rhythm. ?GI: Abdomen soft and nontender.  No hepatosplenomegaly. ?Vascular: No leg edema. ?Neuro: Vibratory sense mildly to moderately decreased over the fingertips per tuning fork exam. ?Skin: Palms without erythema. ?Port-A-Cath without erythema. ? ? ?Lab Results: ? ?Lab Results  ?Component Value Date  ? WBC 8.7 04/05/2022  ? HGB 11.8 (L) 04/05/2022  ? HCT 35.3 (L) 04/05/2022  ? MCV 103.5 (H) 04/05/2022  ? PLT 136 (L) 04/05/2022  ? NEUTROABS 5.9 04/05/2022  ? ? ?Imaging: ? ?No results found. ? ?Medications: I have reviewed the patient's current medications. ? ?Assessment/Plan: ?Pancreas cancer, status post a pancreaticoduodenectomy 11/12/2021, stage IIb (pT1,pN1) ?1/11 lymph nodes, perineural invasion positive, positive margin at the SMA and SMV ?MRCP 09/27/2021-diffuse intrahepatic and common bile duct dilatation with abrupt termination at the level of the head of the pancreas, indistinct area of hypoenhancement in the pancreas head ?ERCP with placement of pancreatic duct and bile duct stents 10/04/2021 ?EUS 1 10/04/2021-no pancreas mass identified, FNA of pancreas head-"atypical "cells, no adenopathy ?CT abdomen/pelvis  10/18/2021-no focal liver abnormality, intra and extrahepatic biliary ductal dilatation with narrowing of the common duct and the pancreas head, no enlarged abdominal lymph nodes ?Cycle 1 FOLFIRINOX 12/27/2021, irinotecan held with cycle 1 ?Cycle 2 FOLFIRINOX 01/24/2022, oxaliplatin and irinotecan dose reduced secondary to elevated liver enzymes, Udenyca ?Cycle 3 FOLFIRINOX 02/07/2022, same doses as cycle 2 ?Cycle 4 FOLFIRINOX 02/21/2022 ?Cycle 5 FOLFIRINOX 03/07/2022 ?Cycle 6 FOLFIRINOX 03/22/2022 ?Cycle 7 FOLFIRINOX 04/05/2022 ?Acute pancreatitis 10/04/2021 ?Bipolar disorder ?Left soleal DVT 11/26/2021-apixaban ?Port-A-Cath placement 12/22/2021  ?Atrial fibrillation 12/22/2021, converted to sinus rhythm, discharged home 12/23/2021 on metoprolol 12.5 mg twice daily, on Eliquis for prior DVT. ?Neutropenia secondary to chemotherapy 01/10/2022, Udenyca added with cycle 2 ?Elevated liver enzymes, predating chemotherapy ? ?Disposition: Mr. Grillo appears stable.  He has completed 6 cycles of FOLFIRINOX.  He continues to tolerate chemotherapy well.  Plan to proceed with cycle 7 today as scheduled. ? ?CBC and chemistry panel reviewed.  Labs adequate to proceed with treatment.  There is stable elevation of the liver enzymes.  We will continue to monitor. ? ?He will follow-up with Dr. Gwenlyn Found regarding the episodes of tachycardia. ? ?He will return for follow-up here in 2 weeks.  We are available to see him sooner if needed. ? ? ? ?Ned Card ANP/GNP-BC  ? ?04/05/2022  ?8:49 AM ? ? ? ? ? ? ? ?

## 2022-04-05 NOTE — Patient Instructions (Signed)
Dean [name]   ?Discharge Instructions: ?Thank you for choosing [facility] to provide your oncology and hematology care.  ? ?If you have a lab appointment with the [facility], please go directly to the [facility] and check in at the registration area. ?  ?Wear comfortable clothing and clothing appropriate for easy access to any Portacath or PICC line.  ? ?We strive to give you quality time with your provider. You may need to reschedule your appointment if you arrive late (15 or more minutes).  Arriving late affects you and other patients whose appointments are after yours.  Also, if you miss three or more appointments without notifying the office, you may be dismissed from the clinic at the provider?s discretion.    ?  ?For prescription refill requests, have your pharmacy contact our office and allow 72 hours for refills to be completed.   ? ?Today you received the following chemotherapy and/or immunotherapy agents Oxaliplatin (ELOXATIN), Irinotecan, Leucovorin & Flourouracil (ADRUCIL).    ?  ?To help prevent nausea and vomiting after your treatment, we encourage you to take your nausea medication as directed. ? ?BELOW ARE SYMPTOMS THAT SHOULD BE REPORTED IMMEDIATELY: ?*FEVER GREATER THAN 100.4 F (38 ?C) OR HIGHER ?*CHILLS OR SWEATING ?*NAUSEA AND VOMITING THAT IS NOT CONTROLLED WITH YOUR NAUSEA MEDICATION ?*UNUSUAL SHORTNESS OF BREATH ?*UNUSUAL BRUISING OR BLEEDING ?*URINARY PROBLEMS (pain or burning when urinating, or frequent urination) ?*BOWEL PROBLEMS (unusual diarrhea, constipation, pain near the anus) ?TENDERNESS IN MOUTH AND THROAT WITH OR WITHOUT PRESENCE OF ULCERS (sore throat, sores in mouth, or a toothache) ?UNUSUAL RASH, SWELLING OR PAIN  ?UNUSUAL VAGINAL DISCHARGE OR ITCHING  ? ?Items with * indicate a potential emergency and should be followed up as soon as possible or go to the Emergency Department if any problems should occur. ? ?Please show the  CHEMOTHERAPY ALERT CARD or IMMUNOTHERAPY ALERT CARD at check-in to the Emergency Department and triage nurse. ? ?Should you have questions after your visit or need to cancel or reschedule your appointment, please contact [facility]  Dept: [phone]  and follow the prompts.  Office hours are 8:00 a.m. to 4:30 p.m. Monday - Friday. Please note that voicemails left after 4:00 p.m. may not be returned until the following business day.  We are closed weekends and major holidays. You have access to a nurse at all times for urgent questions. Please call the main number to the clinic Dept: [phone] and follow the prompts. ? ? ?For any non-urgent questions, you may also contact your provider using MyChart. We now offer e-Visits for anyone 36 and older to request care online for non-urgent symptoms. For details visit [email or web address]. ?  ?Also download the MyChart app! Go to the app store, search "MyChart", open the app, select [facility], and log in with your MyChart username and password. ? ?Due to Covid, a mask is required upon entering the hospital/clinic. If you do not have a mask, one will be given to you upon arrival. For doctor visits, patients may have 1 support person aged 14 or older with them. For treatment visits, patients cannot have anyone with them due to current Covid guidelines and our immunocompromised population.  ? ?Oxaliplatin Injection ?What is this medication? ?OXALIPLATIN (ox AL i PLA tin) is a chemotherapy drug. It targets fast dividing cells, like cancer cells, and causes these cells to die. This medicine is used to treat cancers of the colon and rectum,  and many other cancers. ?This medicine may be used for other purposes; ask your health care provider or pharmacist if you have questions. ?COMMON BRAND NAME(S): Eloxatin ?What should I tell my care team before I take this medication? ?They need to know if you have any of these conditions: ?heart  disease ?history of irregular heartbeat ?liver disease ?low blood counts, like white cells, platelets, or red blood cells ?lung or breathing disease, like asthma ?take medicines that treat or prevent blood clots ?tingling of the fingers or toes, or other nerve disorder ?an unusual or allergic reaction to oxaliplatin, other chemotherapy, other medicines, foods, dyes, or preservatives ?pregnant or trying to get pregnant ?breast-feeding ?How should I use this medication? ?This drug is given as an infusion into a vein. It is administered in a hospital or clinic by a specially trained health care professional. ?Talk to your pediatrician regarding the use of this medicine in children. Special care may be needed. ?Overdosage: If you think you have taken too much of this medicine contact a poison control center or emergency room at once. ?NOTE: This medicine is only for you. Do not share this medicine with others. ?What if I miss a dose? ?It is important not to miss a dose. Call your doctor or health care professional if you are unable to keep an appointment. ?What may interact with this medication? ?Do not take this medicine with any of the following medications: ?cisapride ?dronedarone ?pimozide ?thioridazine ?This medicine may also interact with the following medications: ?aspirin and aspirin-like medicines ?certain medicines that treat or prevent blood clots like warfarin, apixaban, dabigatran, and rivaroxaban ?cisplatin ?cyclosporine ?diuretics ?medicines for infection like acyclovir, adefovir, amphotericin B, bacitracin, cidofovir, foscarnet, ganciclovir, gentamicin, pentamidine, vancomycin ?NSAIDs, medicines for pain and inflammation, like ibuprofen or naproxen ?other medicines that prolong the QT interval (an abnormal heart rhythm) ?pamidronate ?zoledronic acid ?This list may not describe all possible interactions. Give your health care provider a list of all the medicines, herbs, non-prescription drugs, or dietary  supplements you use. Also tell them if you smoke, drink alcohol, or use illegal drugs. Some items may interact with your medicine. ?What should I watch for while using this medication? ?Your condition will be monitored carefully while you are receiving this medicine. ?You may need blood work done while you are taking this medicine. ?This medicine may make you feel generally unwell. This is not uncommon as chemotherapy can affect healthy cells as well as cancer cells. Report any side effects. Continue your course of treatment even though you feel ill unless your healthcare professional tells you to stop. ?This medicine can make you more sensitive to cold. Do not drink cold drinks or use ice. Cover exposed skin before coming in contact with cold temperatures or cold objects. When out in cold weather wear warm clothing and cover your mouth and nose to warm the air that goes into your lungs. Tell your doctor if you get sensitive to the cold. ?Do not become pregnant while taking this medicine or for 9 months after stopping it. Women should inform their health care professional if they wish to become pregnant or think they might be pregnant. Men should not father a child while taking this medicine and for 6 months after stopping it. There is potential for serious side effects to an unborn child. Talk to your health care professional for more information. ?Do not breast-feed a child while taking this medicine or for 3 months after stopping it. ?This medicine has caused ovarian failure  in some women. This medicine may make it more difficult to get pregnant. Talk to your health care professional if you are concerned about your fertility. ?This medicine has caused decreased sperm counts in some men. This may make it more difficult to father a child. Talk to your health care professional if you are concerned about your fertility. ?This medicine may increase your risk of getting an infection. Call your health care professional  for advice if you get a fever, chills, or sore throat, or other symptoms of a cold or flu. Do not treat yourself. Try to avoid being around people who are sick. ?Avoid taking medicines that contain aspirin, acetaminop

## 2022-04-05 NOTE — Progress Notes (Signed)
Patient seen by Lisa Thomas NP today  Vitals are within treatment parameters.  Labs reviewed by Lisa Thomas NP and are within treatment parameters.  Per physician team, patient is ready for treatment and there are NO modifications to the treatment plan.     

## 2022-04-05 NOTE — Progress Notes (Addendum)
Dean [name] ? ?Clinical Social Worker met with caregiver to discuss adjustment concerns. Dean [name] stated she is experiencing a difficult time with the diagnosis and is constantly worrying about whether the treatment will be successful. CSW and Dean [name] discussed different strategies to assist with thought rumination and mindfulness practices.  CSW and Dean [name] discussed ways to use boundaries with friends and co-workers, and ways to discuss concerns and fears with the patient her family. Dean [name] stated she often doesn't want to express her concerns because she is concerned she will overwhelmed by what she feels.  CSW recommended Dean [name] write her concerns and make list on what she could control.  CSW discussed CBT, and how thoughts, emotions and actions cycle to each other and different skills to use to modify/change thoughts as a way to accept situations and support emotional responses in a more positive way.  Dean [name] stated she would try out skills and scheduled appointment to meet with CSW on May 16th 2023 @ 9:00AM. ? ? ? ? ?Hector Taft , LCSW ?

## 2022-04-05 NOTE — Progress Notes (Signed)
Patient presents for treatment. RN assessment completed along with the following: ? ?Labs/vitals reviewed - Yes, and within treatment parameters.   ?Weight within 10% of previous measurement - Yes ?Informed consent completed and reflects current therapy/intent - Yes, on date 12/27/2021             ?Provider progress note reviewed - Today's provider note is not yet available. I reviewed the most recent oncology provider progress note in chart dated 03/22/2022. ?Treatment/Antibody/Supportive plan reviewed - Yes, and there are no adjustments needed for today's treatment. ?S&H and other orders reviewed - Yes, and there are no additional orders identified. ?Previous treatment date reviewed - Yes, and the appropriate amount of time has elapsed between treatments. ?Clinic Hand Off Received from - Yes from [place], RN ? ?Patient to proceed with treatment.  ? ?

## 2022-04-06 ENCOUNTER — Ambulatory Visit (INDEPENDENT_AMBULATORY_CARE_PROVIDER_SITE_OTHER): Payer: BC Managed Care – PPO | Admitting: Cardiovascular Disease

## 2022-04-06 ENCOUNTER — Encounter: Payer: Self-pay | Admitting: Oncology

## 2022-04-06 ENCOUNTER — Encounter: Payer: Self-pay | Admitting: *Deleted

## 2022-04-06 ENCOUNTER — Encounter: Payer: Self-pay | Admitting: Radiology

## 2022-04-06 ENCOUNTER — Encounter: Payer: Self-pay | Admitting: Cardiovascular Disease

## 2022-04-06 VITALS — BP 112/60 | HR 61 | Ht 71.0 in | Wt 162.0 lb

## 2022-04-06 DIAGNOSIS — I480 Paroxysmal atrial fibrillation: Secondary | ICD-10-CM | POA: Diagnosis not present

## 2022-04-06 NOTE — Progress Notes (Signed)
Enrolled patient for a 30 day [employer] Event monitor to be mailed to patients home.  

## 2022-04-06 NOTE — Progress Notes (Signed)
? ? ? ?04/06/2022 ?Dean [name]   ?20-Aug-1966  ?[id number] ? ?Primary Physician Scifres, Dean Server, PA-C (Inactive) ?Primary Cardiologist: Lorretta Harp MD Dean [name], [place] ? ?HPI:  Dean [name] is a 56 y.o.   thin-appearing Caucasian male with bipolar disease, GERD, thyroid disease and pancreatic cancer status post Whipple procedure 11/12/2021.  I last saw him in the office 01/26/2022.  He had also developed a DVT on Eliquis at that time.  He was having a Port-A-Cath placed and developed A-fib with RVR post procedure on 12/22/2021 which quickly resolved with treatment with IV diltiazem.  He has had no recurrence. ? ?Since I saw him 2 and half months ago he has had 2 episodes of tachypalpitations which he was symptomatic from.  I am assuming this was PAF.  He remains on Eliquis oral anticoagulation because of prior DVT. ? ?Current Meds  ?Medication Sig  ? ALPRAZolam (XANAX) 0.5 MG tablet TAKE 1 TABLET BY MOUTH AT BEDTIME AS NEEDED FOR ANXIETY.  ? apixaban (ELIQUIS) 5 MG TABS tablet Take 1 tablet (5 mg total) by mouth 2 (two) times daily.  ? betamethasone valerate (VALISONE) 0.1 % cream Apply topically.  ? CREON 36000-114000 units CPEP capsule Take by mouth.  ? divalproex (DEPAKOTE ER) 500 MG 24 hr tablet TAKE 5 TABLETS (2,500 MG TOTAL) BY MOUTH DAILY. (Patient taking differently: Take 2,000 mg by mouth at bedtime.)  ? levothyroxine (SYNTHROID) 137 MCG tablet Take 137 mcg by mouth daily before breakfast.  ? lidocaine-prilocaine (EMLA) cream Apply 1 application topically as needed. Apply 1/2 tablespoon to port site and cover with Press-and-Seal 2 hours prior to access to numb site. Do not start until 14 days after port is inserted.  ? ondansetron (ZOFRAN) 8 MG tablet Take 1 tablet (8 mg total) by mouth every 8 (eight) hours as needed for nausea or vomiting. Start taking 72 hours after IV chemotherapy treatment given  ? prochlorperazine (COMPAZINE) 10 MG tablet Take 1 tablet (10 mg total) by mouth  every 6 (six) hours as needed for nausea.  ? [DISCONTINUED] pantoprazole (PROTONIX) 40 MG tablet Take 40 mg by mouth daily.  ?  ? ?Allergies  ?Allergen Reactions  ? Erythromycin Nausea Only  ? Ibuprofen Other (See Comments)  ?  Peptic Ulcer ?nsaids  ? Nsaids Other (See Comments)  ? ? ?Social History  ? ?Socioeconomic History  ? Marital status: Married  ?  Spouse name: Not on file  ? Number of children: Not on file  ? Years of education: Not on file  ? Highest education level: Not on file  ?Occupational History  ? Not on file  ?Tobacco Use  ? Smoking status: Former  ?  Types: Cigarettes  ?  Quit date: 2012  ?  Years since quitting: 11.3  ? Smokeless tobacco: Never  ?Vaping Use  ? Vaping Use: Never used  ?Substance and Sexual Activity  ? Alcohol use: Not Currently  ? Drug use: No  ? Sexual activity: Not on file  ?Other Topics Concern  ? Not on file  ?Social History Narrative  ? Not on file  ? ?Social Determinants of Health  ? ?Financial Resource Strain: Low Risk   ? Difficulty of Paying Living Expenses: Not very hard  ?Food Insecurity: No Food Insecurity  ? Worried About Charity fundraiser in the Last Year: Never true  ? Ran Out of Food in the Last Year: Never true  ?Transportation Needs: No Transportation Needs  ? Lack of  Transportation (Medical): No  ? Lack of Transportation (Non-Medical): No  ?Physical Activity: Inactive  ? Days of Exercise per Week: 0 days  ? Minutes of Exercise per Session: 0 min  ?Stress: Stress Concern Present  ? Feeling of Stress : To some extent  ?Social Connections: Moderately Integrated  ? Frequency of Communication with Friends and Family: Three times a week  ? Frequency of Social Gatherings with Friends and Family: Twice a week  ? Attends Religious Services: 1 to 4 times per year  ? Active Member of Clubs or Organizations: No  ? Attends Archivist Meetings: Never  ? Marital Status: Married  ?Intimate Partner Violence: Not At Risk  ? Fear of Current or Ex-Partner: No  ?  Emotionally Abused: No  ? Physically Abused: No  ? Sexually Abused: No  ?  ? ?Review of Systems: ?General: negative for chills, fever, night sweats or weight changes.  ?Cardiovascular: negative for chest pain, dyspnea on exertion, edema, orthopnea, palpitations, paroxysmal nocturnal dyspnea or shortness of breath ?Dermatological: negative for rash ?Respiratory: negative for cough or wheezing ?Urologic: negative for hematuria ?Abdominal: negative for nausea, vomiting, diarrhea, bright red blood per rectum, melena, or hematemesis ?Neurologic: negative for visual changes, syncope, or dizziness ?All other systems reviewed and are otherwise negative except as noted above. ? ? ? ?Blood pressure 112/60, pulse 61, height '5\' 11"'$  (1.803 m), weight 162 lb (73.5 kg).  ?General appearance: alert and no distress ?Neck: no adenopathy, no carotid bruit, no JVD, supple, symmetrical, trachea midline, and thyroid not enlarged, symmetric, no tenderness/mass/nodules ?Lungs: clear to auscultation bilaterally ?Heart: regular rate and rhythm, S1, S2 normal, no murmur, click, rub or gallop ?Extremities: extremities normal, atraumatic, no cyanosis or edema ?Pulses: 2+ and symmetric ?Skin: Skin color, texture, turgor normal. No rashes or lesions ?Neurologic: Grossly normal ? ?EKG sinus rhythm at 61 without ST or T wave changes.  Personally reviewed this EKG. ? ?ASSESSMENT AND PLAN:  ? ?Atrial fibrillation ([place]) ?[name]  presents today for several episodes of tachypalpitations since I saw him 3 months ago.  He has had PAF in the PACU when he had a Port-A-Cath placed.  He is on Eliquis for DVT.  He is symptomatic from these episodes.  I am going to get a 30-day event monitor and a 2D echo to further evaluate. ? ? ? ? ?Lorretta Harp MD FACP,FACC,FAHA, FSCAI ?04/06/2022 ?3:09 PM ?

## 2022-04-06 NOTE — Assessment & Plan Note (Signed)
Mr. Bonser  presents today for several episodes of tachypalpitations since I saw him 3 months ago.  He has had PAF in the PACU when he had a Port-A-Cath placed.  He is on Eliquis for DVT.  He is symptomatic from these episodes.  I am going to get a 30-day event monitor and a 2D echo to further evaluate. ?

## 2022-04-06 NOTE — Patient Instructions (Signed)
?  Testing/Procedures: ? ?Your physician has requested that you have an echocardiogram. Echocardiography is a painless test that uses sound waves to create images of your heart. It provides your doctor with information about the size and shape of your heart and how well your heart?s chambers and valves are working. This procedure takes approximately one hour. There are no restrictions for this procedure. [place] ? ?Your physician has recommended that you wear a 30 DAY event monitor. Event monitors are medical devices that record the heart?s electrical activity. Doctors most often [place] these monitors to diagnose arrhythmias. Arrhythmias are problems with the speed or rhythm of the heartbeat. The monitor is a small, portable device. You can wear one while you do your normal daily activities. This is usually used to diagnose what is causing palpitations/syncope (passing out).  ? ? ?Follow-Up: ?At [facility], you and your health needs are our priority.  As part of our continuing mission to provide you with exceptional heart care, we have created designated Provider Care Teams.  These Care Teams include your primary Cardiologist (physician) and Advanced Practice Providers (APPs -  Physician Assistants and Nurse Practitioners) who all work together to provide you with the care you need, when you need it. ? ?We recommend signing up for the patient portal called "MyChart".  Sign up information is provided on this After Visit Summary.  MyChart is used to connect with patients for Virtual Visits (Telemedicine).  Patients are able to view lab/test results, encounter notes, upcoming appointments, etc.  Non-urgent messages can be sent to your provider as well.   ?To learn more about what you can do with MyChart, go to [email or web address].   ? ?Your next appointment:   ?3 month(s) ? ?The format for your next appointment:   ?In Person ? ?Provider:   ?Quay Burow, MD   ? ? ? ? ?Important Information About  [place] ? ? ? ? ?  ?

## 2022-04-07 ENCOUNTER — Inpatient Hospital Stay: Payer: BC Managed Care – PPO

## 2022-04-07 VITALS — BP 139/65 | HR 62 | Temp 98.2°F | Resp 18

## 2022-04-07 DIAGNOSIS — R000 Tachycardia, unspecified: Secondary | ICD-10-CM | POA: Diagnosis not present

## 2022-04-07 DIAGNOSIS — C250 Malignant neoplasm of head of pancreas: Secondary | ICD-10-CM

## 2022-04-07 DIAGNOSIS — Z5111 Encounter for antineoplastic chemotherapy: Secondary | ICD-10-CM | POA: Diagnosis not present

## 2022-04-07 MED ORDER — SODIUM CHLORIDE 0.9% FLUSH
10.0000 mL | INTRAVENOUS | Status: DC | PRN
  Administered 2022-04-07: 10 mL

## 2022-04-07 MED ORDER — HEPARIN SOD (PORK) LOCK FLUSH 100 UNIT/ML IV SOLN
500.0000 [IU] | Freq: Once | INTRAVENOUS | Status: AC | PRN
  Administered 2022-04-07: 500 [IU]

## 2022-04-07 MED ORDER — PEGFILGRASTIM-CBQV 6 MG/0.6ML ~~LOC~~ SOSY
6.0000 mg | PREFILLED_SYRINGE | Freq: Once | SUBCUTANEOUS | Status: AC
  Administered 2022-04-07: 6 mg via SUBCUTANEOUS

## 2022-04-07 NOTE — Patient Instructions (Signed)

## 2022-04-13 ENCOUNTER — Ambulatory Visit (INDEPENDENT_AMBULATORY_CARE_PROVIDER_SITE_OTHER): Payer: BC Managed Care – PPO

## 2022-04-13 DIAGNOSIS — I480 Paroxysmal atrial fibrillation: Secondary | ICD-10-CM | POA: Diagnosis not present

## 2022-04-13 LAB — ECHOCARDIOGRAM COMPLETE
AR max vel: 3.13 cm2
AV Area VTI: 2.79 cm2
AV Area mean vel: 2.69 cm2
AV Mean grad: 3 mmHg
AV Peak grad: 7.2 mmHg
Ao pk vel: 1.34 m/s
Area-P 1/2: 2.74 cm2
S' Lateral: 2.67 cm
Single Plane A4C EF: 73.6 %

## 2022-04-17 ENCOUNTER — Other Ambulatory Visit: Payer: Self-pay | Admitting: Oncology

## 2022-04-19 ENCOUNTER — Inpatient Hospital Stay: Payer: BC Managed Care – PPO

## 2022-04-19 ENCOUNTER — Encounter: Payer: Self-pay | Admitting: Oncology

## 2022-04-19 ENCOUNTER — Encounter: Payer: Self-pay | Admitting: *Deleted

## 2022-04-19 ENCOUNTER — Encounter: Payer: Self-pay | Admitting: Licensed Clinical Social Worker

## 2022-04-19 ENCOUNTER — Other Ambulatory Visit (HOSPITAL_COMMUNITY): Payer: Self-pay

## 2022-04-19 ENCOUNTER — Other Ambulatory Visit: Payer: Self-pay

## 2022-04-19 ENCOUNTER — Telehealth: Payer: Self-pay

## 2022-04-19 ENCOUNTER — Inpatient Hospital Stay (HOSPITAL_BASED_OUTPATIENT_CLINIC_OR_DEPARTMENT_OTHER): Payer: BC Managed Care – PPO | Admitting: Oncology

## 2022-04-19 VITALS — BP 124/74 | HR 61 | Temp 98.2°F | Resp 18 | Ht 71.0 in | Wt 157.8 lb

## 2022-04-19 DIAGNOSIS — C250 Malignant neoplasm of head of pancreas: Secondary | ICD-10-CM

## 2022-04-19 DIAGNOSIS — Z5111 Encounter for antineoplastic chemotherapy: Secondary | ICD-10-CM | POA: Diagnosis not present

## 2022-04-19 DIAGNOSIS — R000 Tachycardia, unspecified: Secondary | ICD-10-CM | POA: Diagnosis not present

## 2022-04-19 DIAGNOSIS — K831 Obstruction of bile duct: Secondary | ICD-10-CM | POA: Diagnosis not present

## 2022-04-19 LAB — CMP (CANCER CENTER ONLY)
ALT: 60 U/L — ABNORMAL HIGH (ref 0–44)
AST: 49 U/L — ABNORMAL HIGH (ref 15–41)
Albumin: 3.7 g/dL (ref 3.5–5.0)
Alkaline Phosphatase: 318 U/L — ABNORMAL HIGH (ref 38–126)
Anion gap: 9 (ref 5–15)
BUN: 14 mg/dL (ref 6–20)
CO2: 27 mmol/L (ref 22–32)
Calcium: 9.1 mg/dL (ref 8.9–10.3)
Chloride: 106 mmol/L (ref 98–111)
Creatinine: 0.79 mg/dL (ref 0.61–1.24)
GFR, Estimated: >60 mL/min (ref >60)
Glucose, Bld: 107 mg/dL — ABNORMAL HIGH (ref 70–99)
Potassium: 3.7 mmol/L (ref 3.5–5.1)
Sodium: 142 mmol/L (ref 135–145)
Total Bilirubin: 0.3 mg/dL (ref 0.3–1.2)
Total Protein: 6.2 g/dL — ABNORMAL LOW (ref 6.5–8.1)

## 2022-04-19 LAB — CBC WITH DIFFERENTIAL (CANCER CENTER ONLY)
Abs Immature Granulocytes: 0.03 10*3/uL (ref 0.00–0.07)
Basophils Absolute: 0 10*3/uL (ref 0.0–0.1)
Basophils Relative: 0 %
Eosinophils Absolute: 0.1 10*3/uL (ref 0.0–0.5)
Eosinophils Relative: 1 %
HCT: 32.7 % — ABNORMAL LOW (ref 39.0–52.0)
Hemoglobin: 11.1 g/dL — ABNORMAL LOW (ref 13.0–17.0)
Immature Granulocytes: 0 %
Lymphocytes Relative: 22 %
Lymphs Abs: 1.5 10*3/uL (ref 0.7–4.0)
MCH: 35.7 pg — ABNORMAL HIGH (ref 26.0–34.0)
MCHC: 33.9 g/dL (ref 30.0–36.0)
MCV: 105.1 fL — ABNORMAL HIGH (ref 80.0–100.0)
Monocytes Absolute: 0.6 10*3/uL (ref 0.1–1.0)
Monocytes Relative: 8 %
Neutro Abs: 4.8 10*3/uL (ref 1.7–7.7)
Neutrophils Relative %: 69 %
Platelet Count: 122 10*3/uL — ABNORMAL LOW (ref 150–400)
RBC: 3.11 MIL/uL — ABNORMAL LOW (ref 4.22–5.81)
RDW: 15.1 % (ref 11.5–15.5)
WBC Count: 7 10*3/uL (ref 4.0–10.5)
nRBC: 0 % (ref 0.0–0.2)

## 2022-04-19 MED ORDER — CAPECITABINE 500 MG PO TABS
1500.0000 mg | ORAL_TABLET | Freq: Two times a day (BID) | ORAL | 0 refills | Status: DC
  Filled 2022-04-19: qty 168, 28d supply, fill #0

## 2022-04-19 MED ORDER — SODIUM CHLORIDE 0.9 % IV SOLN
120.0000 mg/m2 | Freq: Once | INTRAVENOUS | Status: AC
  Administered 2022-04-19: 220 mg via INTRAVENOUS
  Filled 2022-04-19: qty 6

## 2022-04-19 MED ORDER — PALONOSETRON HCL INJECTION 0.25 MG/5ML
0.2500 mg | Freq: Once | INTRAVENOUS | Status: AC
  Administered 2022-04-19: 0.25 mg via INTRAVENOUS
  Filled 2022-04-19: qty 5

## 2022-04-19 MED ORDER — SODIUM CHLORIDE 0.9 % IV SOLN
150.0000 mg | Freq: Once | INTRAVENOUS | Status: AC
  Administered 2022-04-19: 150 mg via INTRAVENOUS
  Filled 2022-04-19: qty 5

## 2022-04-19 MED ORDER — OXALIPLATIN CHEMO INJECTION 100 MG/20ML
65.0000 mg/m2 | Freq: Once | INTRAVENOUS | Status: AC
  Administered 2022-04-19: 125 mg via INTRAVENOUS
  Filled 2022-04-19: qty 20

## 2022-04-19 MED ORDER — DEXTROSE 5 % IV SOLN: Freq: Once | INTRAVENOUS | Status: AC

## 2022-04-19 MED ORDER — SODIUM CHLORIDE 0.9 % IV SOLN
2000.0000 mg/m2 | INTRAVENOUS | Status: DC
  Administered 2022-04-19: 3800 mg via INTRAVENOUS
  Filled 2022-04-19: qty 76

## 2022-04-19 MED ORDER — SODIUM CHLORIDE 0.9 % IV SOLN
400.0000 mg/m2 | Freq: Once | INTRAVENOUS | Status: AC
  Administered 2022-04-19: 756 mg via INTRAVENOUS
  Filled 2022-04-19: qty 37.8

## 2022-04-19 MED ORDER — SODIUM CHLORIDE 0.9 % IV SOLN
10.0000 mg | Freq: Once | INTRAVENOUS | Status: AC
  Administered 2022-04-19: 10 mg via INTRAVENOUS
  Filled 2022-04-19: qty 1

## 2022-04-19 MED ORDER — CAPECITABINE 500 MG PO TABS
ORAL_TABLET | ORAL | 0 refills | Status: DC
  Filled 2022-04-19: qty 168, fill #0

## 2022-04-19 MED ORDER — ATROPINE SULFATE 1 MG/ML IV SOLN
0.5000 mg | Freq: Once | INTRAVENOUS | Status: AC | PRN
  Administered 2022-04-19: 0.5 mg via INTRAVENOUS
  Filled 2022-04-19: qty 1

## 2022-04-19 NOTE — Patient Instructions (Signed)
[facility]   ?Discharge Instructions: ?Thank you for choosing [facility] to provide your oncology and hematology care.  ? ?If you have a lab appointment with the [facility], please go directly to the [facility] and check in at the registration area. ?  ?Wear comfortable clothing and clothing appropriate for easy access to any Portacath or PICC line.  ? ?We strive to give you quality time with your provider. You may need to reschedule your appointment if you arrive late (15 or more minutes).  Arriving late affects you and other patients whose appointments are after yours.  Also, if you miss three or more appointments without notifying the office, you may be dismissed from the clinic at the provider?s discretion.    ?  ?For prescription refill requests, have your pharmacy contact our office and allow 72 hours for refills to be completed.   ? ?Today you received the following chemotherapy and/or immunotherapy agents Oxaliplatin (ELOXATIN), Irinotecan (CAMPTOSAR), Leucovorin, Flourouracil (ADRUCIL).    ?  ?To help prevent nausea and vomiting after your treatment, we encourage you to take your nausea medication as directed. ? ?BELOW ARE SYMPTOMS THAT SHOULD BE REPORTED IMMEDIATELY: ?*FEVER GREATER THAN 100.4 F (38 ?C) OR HIGHER ?*CHILLS OR SWEATING ?*NAUSEA AND VOMITING THAT IS NOT CONTROLLED WITH YOUR NAUSEA MEDICATION ?*UNUSUAL SHORTNESS OF BREATH ?*UNUSUAL BRUISING OR BLEEDING ?*URINARY PROBLEMS (pain or burning when urinating, or frequent urination) ?*BOWEL PROBLEMS (unusual diarrhea, constipation, pain near the anus) ?TENDERNESS IN MOUTH AND THROAT WITH OR WITHOUT PRESENCE OF ULCERS (sore throat, sores in mouth, or a toothache) ?UNUSUAL RASH, SWELLING OR PAIN  ?UNUSUAL VAGINAL DISCHARGE OR ITCHING  ? ?Items with * indicate a potential emergency and should be followed up as soon as possible or go to the Emergency Department if any problems should occur. ? ?Please show  the CHEMOTHERAPY ALERT CARD or IMMUNOTHERAPY ALERT CARD at check-in to the Emergency Department and triage nurse. ? ?Should you have questions after your visit or need to cancel or reschedule your appointment, please contact [facility]  Dept: [phone]  and follow the prompts.  Office hours are 8:00 a.m. to 4:30 p.m. Monday - Friday. Please note that voicemails left after 4:00 p.m. may not be returned until the following business day.  We are closed weekends and major holidays. You have access to a nurse at all times for urgent questions. Please call the main number to the clinic Dept: [phone] and follow the prompts. ? ? ?For any non-urgent questions, you may also contact your provider using MyChart. We now offer e-Visits for anyone 5 and older to request care online for non-urgent symptoms. For details visit [email or web address]. ?  ?Also download the MyChart app! Go to the app store, search "MyChart", open the app, select [facility], and log in with your MyChart username and password. ? ?Due to Covid, a mask is required upon entering the hospital/clinic. If you do not have a mask, one will be given to you upon arrival. For doctor visits, patients may have 1 support person aged 78 or older with them. For treatment visits, patients cannot have anyone with them due to current Covid guidelines and our immunocompromised population.  ? ?Oxaliplatin Injection ?What is this medication? ?OXALIPLATIN (ox AL i PLA tin) is a chemotherapy drug. It targets fast dividing cells, like cancer cells, and causes these cells to die. This medicine is used to treat cancers of the colon and rectum,  and many other cancers. ?This medicine may be used for other purposes; ask your health care provider or pharmacist if you have questions. ?COMMON BRAND NAME(S): Eloxatin ?What should I tell my care team before I take this medication? ?They need to know if you have any of these conditions: ?heart  disease ?history of irregular heartbeat ?liver disease ?low blood counts, like white cells, platelets, or red blood cells ?lung or breathing disease, like asthma ?take medicines that treat or prevent blood clots ?tingling of the fingers or toes, or other nerve disorder ?an unusual or allergic reaction to oxaliplatin, other chemotherapy, other medicines, foods, dyes, or preservatives ?pregnant or trying to get pregnant ?breast-feeding ?How should I use this medication? ?This drug is given as an infusion into a vein. It is administered in a hospital or clinic by a specially trained health care professional. ?Talk to your pediatrician regarding the use of this medicine in children. Special care may be needed. ?Overdosage: If you think you have taken too much of this medicine contact a poison control center or emergency room at once. ?NOTE: This medicine is only for you. Do not share this medicine with others. ?What if I miss a dose? ?It is important not to miss a dose. Call your doctor or health care professional if you are unable to keep an appointment. ?What may interact with this medication? ?Do not take this medicine with any of the following medications: ?cisapride ?dronedarone ?pimozide ?thioridazine ?This medicine may also interact with the following medications: ?aspirin and aspirin-like medicines ?certain medicines that treat or prevent blood clots like warfarin, apixaban, dabigatran, and rivaroxaban ?cisplatin ?cyclosporine ?diuretics ?medicines for infection like acyclovir, adefovir, amphotericin B, bacitracin, cidofovir, foscarnet, ganciclovir, gentamicin, pentamidine, vancomycin ?NSAIDs, medicines for pain and inflammation, like ibuprofen or naproxen ?other medicines that prolong the QT interval (an abnormal heart rhythm) ?pamidronate ?zoledronic acid ?This list may not describe all possible interactions. Give your health care provider a list of all the medicines, herbs, non-prescription drugs, or dietary  supplements you use. Also tell them if you smoke, drink alcohol, or use illegal drugs. Some items may interact with your medicine. ?What should I watch for while using this medication? ?Your condition will be monitored carefully while you are receiving this medicine. ?You may need blood work done while you are taking this medicine. ?This medicine may make you feel generally unwell. This is not uncommon as chemotherapy can affect healthy cells as well as cancer cells. Report any side effects. Continue your course of treatment even though you feel ill unless your healthcare professional tells you to stop. ?This medicine can make you more sensitive to cold. Do not drink cold drinks or use ice. Cover exposed skin before coming in contact with cold temperatures or cold objects. When out in cold weather wear warm clothing and cover your mouth and nose to warm the air that goes into your lungs. Tell your doctor if you get sensitive to the cold. ?Do not become pregnant while taking this medicine or for 9 months after stopping it. Women should inform their health care professional if they wish to become pregnant or think they might be pregnant. Men should not father a child while taking this medicine and for 6 months after stopping it. There is potential for serious side effects to an unborn child. Talk to your health care professional for more information. ?Do not breast-feed a child while taking this medicine or for 3 months after stopping it. ?This medicine has caused ovarian failure  in some women. This medicine may make it more difficult to get pregnant. Talk to your health care professional if you are concerned about your fertility. ?This medicine has caused decreased sperm counts in some men. This may make it more difficult to father a child. Talk to your health care professional if you are concerned about your fertility. ?This medicine may increase your risk of getting an infection. Call your health care professional  for advice if you get a fever, chills, or sore throat, or other symptoms of a cold or flu. Do not treat yourself. Try to avoid being around people who are sick. ?Avoid taking medicines that contain aspirin,

## 2022-04-19 NOTE — Progress Notes (Signed)
Patient seen by Dr. Sherrill today ? ?Vitals are within treatment parameters. ? ?Labs reviewed by Dr. Sherrill and are within treatment parameters. ? ?Per physician team, patient is ready for treatment and there are NO modifications to the treatment plan.  ?

## 2022-04-19 NOTE — Progress Notes (Signed)
[facility] CSW Progress Note ? ?Clinical Social Worker met with caregiver to discuss adjustment and communication concerns. Ms. Ellinwood reported her anxiety is worse and she is experiencing a flare up  fibromyalgia symptoms, and due to this it has been more difficult for her to recognize triggers and avoid emotional responses.  Ms. Maniaci stated she logically understands what is occurring but she is still having a difficult time adjusting to the patient's diagnosis and treatment.  CSW and Ms. Hamme discussed different ways to recognize triggers and how to manage the emotional response.  Ms. Mena stated she and the patient are planning to spend some times discussing serious matters in reference to end life care, long-term care and other financial and legal planning for the future.  CSW and Ms. Dewan discussed different ways to address these issues and concerns as well as what Advance Directives cover and the differences between what an HCPOA and a POA.  CSW and Ms. Heikes discussed methods to assist with triggers and how to manage them during the ir conversation.  Ms. Peacock scheduled an appointment to meet with CSW on May 30th, 2023 at 9:00AM ? ? ? ?Nissi Doffing, LCSW  ?

## 2022-04-19 NOTE — Telephone Encounter (Signed)
Oral Oncology Pharmacist Encounter ? ?Received new prescription for capecitabine (Xeloda) for the treatment of stage IIb pancreatic cancer in conjunction with radiation, planned duration 4-6 weeks. ? ?Labs from 04/19/22 assessed, no relevant lab abnormalities. Prescription dose and frequency assessed.  ? ?Current medication list in Epic reviewed, one DDI with capecitabine identified: ondansetron - monitor for QTc prolongation. ? ?Evaluated chart and no patient barriers to medication adherence identified.  ? ?Prescription has been e-scribed to the [facility] for benefits analysis and approval. ? ?Oral Oncology Clinic will continue to follow for insurance authorization, copayment issues, initial counseling and start date. ? ?Patient agreed to treatment on 04/19/22 per MD documentation. ? ?Benn Moulder, PharmD ?Pharmacy Resident  ?04/19/2022 ?4:11 PM ? ?

## 2022-04-19 NOTE — Progress Notes (Signed)
?[facility] ?OFFICE PROGRESS NOTE ? ? ?Diagnosis: Pancreas cancer ? ?INTERVAL HISTORY:  ? ?Dean [name] returns as scheduled.  He completed another cycle of FOLFOXIRI on 04/05/2022.  No nausea/vomiting, mouth sores, or diarrhea.  He had cold sensitivity in the left hand for 3 to 4 days following chemotherapy.  No other neuropathy symptoms.  He feels well.  Good appetite. ? ?Objective: ? ?Vital signs in last 24 hours: ? ?Blood pressure 124/74, pulse 61, temperature 98.2 ?F (36.8 ?C), temperature source Oral, resp. rate 18, height '5\' 11"'$  (1.803 m), weight 71.6 kg, SpO2 100 %. ?  ? ?HEENT: Healing ulcer at the right buccal mucosa.  No thrush ?Resp: Lungs clear bilaterally ?Cardio: Regular rate and rhythm ?GI: No hepatosplenomegaly, nontender ?Vascular: No leg edema ?Neuro: Mild-moderate loss of vibratory sense at the fingertips bilaterally ?Skin: No rash ? ?Portacath/PICC-without erythema ? ?Lab Results: ? ?Lab Results  ?Component Value Date  ? WBC 7.0 04/19/2022  ? HGB 11.1 (L) 04/19/2022  ? HCT 32.7 (L) 04/19/2022  ? MCV 105.1 (H) 04/19/2022  ? PLT 122 (L) 04/19/2022  ? NEUTROABS 4.8 04/19/2022  ? ? ?CMP  ?Lab Results  ?Component Value Date  ? NA 140 04/05/2022  ? K 3.8 04/05/2022  ? CL 104 04/05/2022  ? CO2 27 04/05/2022  ? GLUCOSE 99 04/05/2022  ? BUN 13 04/05/2022  ? CREATININE 0.72 04/05/2022  ? CALCIUM 9.5 04/05/2022  ? PROT 6.6 04/05/2022  ? ALBUMIN 4.2 04/05/2022  ? AST 44 (H) 04/05/2022  ? ALT 78 (H) 04/05/2022  ? ALKPHOS 402 (H) 04/05/2022  ? BILITOT 0.5 04/05/2022  ? GFRNONAA >60 04/05/2022  ? GFRAA >60 08/19/2015  ? ? ?Lab Results  ?Component Value Date  ? JAS505 85 (H) 10/07/2021  ? ? ? ?Medications: I have reviewed the patient's current medications. ? ? ?Assessment/Plan: ?Pancreas cancer, status post a pancreaticoduodenectomy 11/12/2021, stage IIb (pT1,pN1) ?1/11 lymph nodes, perineural invasion positive, positive margin at the SMA and SMV ?MRCP 09/27/2021-diffuse intrahepatic and common  bile duct dilatation with abrupt termination at the level of the head of the pancreas, indistinct area of hypoenhancement in the pancreas head ?ERCP with placement of pancreatic duct and bile duct stents 10/04/2021 ?EUS 1 10/04/2021-no pancreas mass identified, FNA of pancreas head-"atypical "cells, no adenopathy ?CT abdomen/pelvis 10/18/2021-no focal liver abnormality, intra and extrahepatic biliary ductal dilatation with narrowing of the common duct and the pancreas head, no enlarged abdominal lymph nodes ?Cycle 1 FOLFIRINOX 12/27/2021, irinotecan held with cycle 1 ?Cycle 2 FOLFIRINOX 01/24/2022, oxaliplatin and irinotecan dose reduced secondary to elevated liver enzymes, Udenyca ?Cycle 3 FOLFIRINOX 02/07/2022, same doses as cycle 2 ?Cycle 4 FOLFIRINOX 02/21/2022 ?Cycle 5 FOLFIRINOX 03/07/2022 ?Cycle 6 FOLFIRINOX 03/22/2022 ?Cycle 7 FOLFIRINOX 04/05/2022 ?Cycle 8 FOLFIRINOX 04/19/2022 ?Acute pancreatitis 10/04/2021 ?Bipolar disorder ?Left soleal DVT 11/26/2021-apixaban ?Port-A-Cath placement 12/22/2021  ?Atrial fibrillation 12/22/2021, converted to sinus rhythm, discharged home 12/23/2021 on metoprolol 12.5 mg twice daily, on Eliquis for prior DVT. ?Neutropenia secondary to chemotherapy 01/10/2022, Udenyca added with cycle 2 ?Elevated liver enzymes, predating chemotherapy ? ? ? ?Disposition: ?Dean [name] appears stable.  He is completed 7 cycles of FOLFIRINOX.  He has tolerated the chemotherapy well.  He will complete the final cycle of FOLFIRINOX today.  The plan is to begin adjuvant radiation/capecitabine within the next few weeks. ? ?We reviewed potential toxicities associated with capecitabine including the chance of mucositis and diarrhea.  We discussed the rash, sun sensitivity, hyperpigmentation, and hand/foot syndrome associated with capecitabine.  He  agrees to proceed. ? ?I will coordinate a start date for radiation/capecitabine with radiation oncology. ? ?A capecitabine prescription was entered today. ? ?Betsy Coder, MD ? ?04/19/2022  ?8:36 AM ? ? ?

## 2022-04-20 ENCOUNTER — Other Ambulatory Visit: Payer: Self-pay

## 2022-04-20 ENCOUNTER — Telehealth: Payer: Self-pay

## 2022-04-20 ENCOUNTER — Other Ambulatory Visit (HOSPITAL_COMMUNITY): Payer: Self-pay

## 2022-04-20 DIAGNOSIS — C250 Malignant neoplasm of head of pancreas: Secondary | ICD-10-CM

## 2022-04-20 MED ORDER — CAPECITABINE 500 MG PO TABS: 1500.0000 mg | ORAL_TABLET | Freq: Two times a day (BID) | ORAL | 0 refills | Status: DC

## 2022-04-20 NOTE — Telephone Encounter (Signed)
Capecitabine prescription was redirected to [facility]. Patient has been notified.  ? ?Benn Moulder, PharmD ?Pharmacy Resident  ?04/20/2022 ?1:08 PM ? ?

## 2022-04-21 ENCOUNTER — Inpatient Hospital Stay: Payer: BC Managed Care – PPO

## 2022-04-21 ENCOUNTER — Encounter: Payer: Self-pay | Admitting: Cardiovascular Disease

## 2022-04-21 VITALS — BP 132/71 | HR 60 | Temp 98.4°F | Resp 18

## 2022-04-21 DIAGNOSIS — C250 Malignant neoplasm of head of pancreas: Secondary | ICD-10-CM

## 2022-04-21 DIAGNOSIS — R000 Tachycardia, unspecified: Secondary | ICD-10-CM | POA: Diagnosis not present

## 2022-04-21 DIAGNOSIS — Z5111 Encounter for antineoplastic chemotherapy: Secondary | ICD-10-CM | POA: Diagnosis not present

## 2022-04-21 MED ORDER — HEPARIN SOD (PORK) LOCK FLUSH 100 UNIT/ML IV SOLN
500.0000 [IU] | Freq: Once | INTRAVENOUS | Status: AC | PRN
  Administered 2022-04-21: 500 [IU]

## 2022-04-21 MED ORDER — PEGFILGRASTIM-CBQV 6 MG/0.6ML ~~LOC~~ SOSY
6.0000 mg | PREFILLED_SYRINGE | Freq: Once | SUBCUTANEOUS | Status: AC
  Administered 2022-04-21: 6 mg via SUBCUTANEOUS
  Filled 2022-04-21: qty 0.6

## 2022-04-21 MED ORDER — SODIUM CHLORIDE 0.9% FLUSH
10.0000 mL | INTRAVENOUS | Status: DC | PRN
  Administered 2022-04-21: 10 mL

## 2022-04-21 NOTE — Patient Instructions (Signed)

## 2022-04-21 NOTE — Telephone Encounter (Signed)
Oral Oncology Pharmacist Encounter   Received notification from [facility] that prior authorization for capecitabine is required.   PA submitted on CMM Key [id number] Status is pending   Oral Oncology Clinic will continue to follow.   Darl Pikes, PharmD, BCPS, [facility] Hematology/Oncology Clinical Pharmacist [facility] Oral [facility] [phone]  04/21/2022 12:25 PM

## 2022-04-21 NOTE — Telephone Encounter (Signed)
Oral Oncology Pharmacist Encounter   Prior Authorization for capecitabine has been approved.     PA# [id number] Effective dates: 04/20/22 through 04/1723   Patient is required to fill at [facility].  Oral Oncology Clinic will continue to follow.   Darl Pikes, PharmD, BCPS. BCOP Hematology/Oncology Clinical Pharmacist [facility]/AP Oral Chemotherapy [facility] Clinic [phone]  04/21/2022 12:26 PM

## 2022-04-23 DIAGNOSIS — I480 Paroxysmal atrial fibrillation: Secondary | ICD-10-CM | POA: Diagnosis not present

## 2022-04-25 NOTE — Telephone Encounter (Signed)
Called [facility] to follow up for copay and delivery status.  Patient will receive medication on 04/27/22.  Copay was $80.  [place] Patient [place] Phone [phone] Fax [phone] 04/25/2022 11:21 AM

## 2022-04-26 ENCOUNTER — Ambulatory Visit
Admission: RE | Admit: 2022-04-26 | Discharge: 2022-04-26 | Disposition: A | Discharge status: Home / Self Care | Payer: BC Managed Care – PPO | Source: Ambulatory Visit | Attending: Radiation Oncology | Admitting: Radiation Oncology

## 2022-04-26 ENCOUNTER — Other Ambulatory Visit: Payer: Self-pay

## 2022-04-26 DIAGNOSIS — C250 Malignant neoplasm of head of pancreas: Secondary | ICD-10-CM | POA: Insufficient documentation

## 2022-04-26 DIAGNOSIS — Z510 Encounter for antineoplastic radiation therapy: Secondary | ICD-10-CM | POA: Insufficient documentation

## 2022-04-27 ENCOUNTER — Ambulatory Visit (INDEPENDENT_AMBULATORY_CARE_PROVIDER_SITE_OTHER): Payer: BC Managed Care – PPO

## 2022-04-27 DIAGNOSIS — I480 Paroxysmal atrial fibrillation: Secondary | ICD-10-CM

## 2022-05-02 ENCOUNTER — Other Ambulatory Visit: Payer: Self-pay | Admitting: Oncology

## 2022-05-03 ENCOUNTER — Encounter: Payer: Self-pay | Admitting: Licensed Clinical Social Worker

## 2022-05-03 NOTE — Progress Notes (Signed)
[facility] CSW Progress Note  Holiday representative met with caregiver to discuss adjustment concerns.  Dean [name] reported feeling much better this last week and improvement in communication between her and the patient.  Dean [name] stated they are able to discuss issus concerning getting paperwork in order, like wills and advance directives.  Dean [name] stated she spoke with Chaplain at [facility] who will go over the paperwork with her and the patient.  CSW and Dean [name] discussed concerns for transitioning from chemo to radiation treatment and transitions from treatment to post-treatment.  CSW and Dean [name] discussed different strategies to engage in conversations and improve communication with each other and their family about what they may be feeling when treatment ends.  Dean [name] discussed her faith and their renewed attendance to church and how this is helping the patient cope.  Dean [name] will continue to use communication skills and journaling to help her manage emotional stress.  Dean [name] scheduled an appointment for 05/17/2021 @ 9:00AM.    Adelene Amas, LCSW

## 2022-05-04 ENCOUNTER — Telehealth: Payer: Self-pay | Admitting: Pharmacist

## 2022-05-04 NOTE — Telephone Encounter (Signed)
Oral Chemotherapy Pharmacist Encounter   Called patient for capecitabine education, he was busy at the time and unable to talk. Offered to call back later and he politely declined. He read through the medication handout I sent him and had no questions. He stated he understood how to take the medication when the time comes to get started.   I told him he could call me if he thought of any questions. My contacted informations was provided in the mailed letter.    Darl Pikes, PharmD, BCPS, BCOP, CPP Hematology/Oncology Clinical Pharmacist /DB/AP Oral [facility] [phone]  05/04/2022 2:01 PM

## 2022-05-05 DIAGNOSIS — C250 Malignant neoplasm of head of pancreas: Secondary | ICD-10-CM | POA: Diagnosis not present

## 2022-05-06 ENCOUNTER — Inpatient Hospital Stay: Payer: BC Managed Care – PPO

## 2022-05-06 ENCOUNTER — Inpatient Hospital Stay: Payer: BC Managed Care – PPO | Attending: Oncology | Admitting: Nurse Practitioner

## 2022-05-06 ENCOUNTER — Encounter: Payer: Self-pay | Admitting: Nurse Practitioner

## 2022-05-06 VITALS — BP 112/71 | HR 61 | Temp 98.2°F | Resp 18 | Ht 71.0 in | Wt 156.4 lb

## 2022-05-06 DIAGNOSIS — Z510 Encounter for antineoplastic radiation therapy: Secondary | ICD-10-CM | POA: Diagnosis not present

## 2022-05-06 DIAGNOSIS — I4891 Unspecified atrial fibrillation: Secondary | ICD-10-CM | POA: Insufficient documentation

## 2022-05-06 DIAGNOSIS — Z9221 Personal history of antineoplastic chemotherapy: Secondary | ICD-10-CM | POA: Insufficient documentation

## 2022-05-06 DIAGNOSIS — Z7901 Long term (current) use of anticoagulants: Secondary | ICD-10-CM | POA: Insufficient documentation

## 2022-05-06 DIAGNOSIS — Z79899 Other long term (current) drug therapy: Secondary | ICD-10-CM | POA: Diagnosis not present

## 2022-05-06 DIAGNOSIS — C250 Malignant neoplasm of head of pancreas: Secondary | ICD-10-CM | POA: Insufficient documentation

## 2022-05-06 DIAGNOSIS — Z86718 Personal history of other venous thrombosis and embolism: Secondary | ICD-10-CM | POA: Diagnosis not present

## 2022-05-06 DIAGNOSIS — R748 Abnormal levels of other serum enzymes: Secondary | ICD-10-CM | POA: Diagnosis not present

## 2022-05-06 DIAGNOSIS — Z95828 Presence of other vascular implants and grafts: Secondary | ICD-10-CM | POA: Diagnosis not present

## 2022-05-06 LAB — CMP (CANCER CENTER ONLY)
ALT: 92 U/L — ABNORMAL HIGH (ref 0–44)
AST: 60 U/L — ABNORMAL HIGH (ref 15–41)
Albumin: 3.7 g/dL (ref 3.5–5.0)
Alkaline Phosphatase: 311 U/L — ABNORMAL HIGH (ref 38–126)
Anion gap: 8 (ref 5–15)
BUN: 17 mg/dL (ref 6–20)
CO2: 28 mmol/L (ref 22–32)
Calcium: 9.6 mg/dL (ref 8.9–10.3)
Chloride: 107 mmol/L (ref 98–111)
Creatinine: 0.77 mg/dL (ref 0.61–1.24)
GFR, Estimated: >60 mL/min (ref >60)
Glucose, Bld: 98 mg/dL (ref 70–99)
Potassium: 3.9 mmol/L (ref 3.5–5.1)
Sodium: 143 mmol/L (ref 135–145)
Total Bilirubin: 0.3 mg/dL (ref 0.3–1.2)
Total Protein: 6.3 g/dL — ABNORMAL LOW (ref 6.5–8.1)

## 2022-05-06 LAB — CBC WITH DIFFERENTIAL (CANCER CENTER ONLY)
Abs Immature Granulocytes: 0.04 10*3/uL (ref 0.00–0.07)
Basophils Absolute: 0 10*3/uL (ref 0.0–0.1)
Basophils Relative: 1 %
Eosinophils Absolute: 0.1 10*3/uL (ref 0.0–0.5)
Eosinophils Relative: 2 %
HCT: 33.4 % — ABNORMAL LOW (ref 39.0–52.0)
Hemoglobin: 11 g/dL — ABNORMAL LOW (ref 13.0–17.0)
Immature Granulocytes: 1 %
Lymphocytes Relative: 23 %
Lymphs Abs: 1.5 10*3/uL (ref 0.7–4.0)
MCH: 35.6 pg — ABNORMAL HIGH (ref 26.0–34.0)
MCHC: 32.9 g/dL (ref 30.0–36.0)
MCV: 108.1 fL — ABNORMAL HIGH (ref 80.0–100.0)
Monocytes Absolute: 0.5 10*3/uL (ref 0.1–1.0)
Monocytes Relative: 8 %
Neutro Abs: 4.4 10*3/uL (ref 1.7–7.7)
Neutrophils Relative %: 65 %
Platelet Count: 109 10*3/uL — ABNORMAL LOW (ref 150–400)
RBC: 3.09 MIL/uL — ABNORMAL LOW (ref 4.22–5.81)
RDW: 14.4 % (ref 11.5–15.5)
WBC Count: 6.6 10*3/uL (ref 4.0–10.5)
nRBC: 0 % (ref 0.0–0.2)

## 2022-05-06 MED ORDER — SODIUM CHLORIDE 0.9% FLUSH
10.0000 mL | INTRAVENOUS | Status: DC | PRN
  Administered 2022-05-06: 10 mL via INTRAVENOUS

## 2022-05-06 MED ORDER — HEPARIN SOD (PORK) LOCK FLUSH 100 UNIT/ML IV SOLN
500.0000 [IU] | Freq: Once | INTRAVENOUS | Status: AC
  Administered 2022-05-06: 500 [IU] via INTRAVENOUS

## 2022-05-06 NOTE — Progress Notes (Signed)
  [facility] OFFICE PROGRESS NOTE   Diagnosis: Pancreas cancer  INTERVAL HISTORY:   Dean [name] returns as scheduled.  He completed the eighth and final cycle of FOLFIRINOX 04/19/2022.  He is scheduled to begin Xeloda/radiation 05/09/2022.  No neuropathy symptoms.  No nausea or vomiting.  No diarrhea.  Objective:  Vital signs in last 24 hours:  Blood pressure 112/71, pulse 61, temperature 98.2 F (36.8 C), temperature source Oral, resp. rate 18, height '5\' 11"'$  (1.803 m), weight 156 lb 6.4 oz (70.9 kg), SpO2 98 %.    HEENT: No thrush or ulcers. Resp: Lungs clear bilaterally. Cardio: Regular rate and rhythm. GI: No hepatosplenomegaly. Vascular: No leg edema. Skin: Palms without erythema. Port-A-Cath without erythema.   Lab Results:  Lab Results  Component Value Date   WBC 7.0 04/19/2022   HGB 11.1 (L) 04/19/2022   HCT 32.7 (L) 04/19/2022   MCV 105.1 (H) 04/19/2022   PLT 122 (L) 04/19/2022   NEUTROABS 4.8 04/19/2022    Imaging:  No results found.  Medications: I have reviewed the patient's current medications.  Assessment/Plan: Pancreas cancer, status post a pancreaticoduodenectomy 11/12/2021, stage IIb (pT1,pN1) 1/11 lymph nodes, perineural invasion positive, positive margin at the SMA and SMV MRCP 09/27/2021-diffuse intrahepatic and common bile duct dilatation with abrupt termination at the level of the head of the pancreas, indistinct area of hypoenhancement in the pancreas head ERCP with placement of pancreatic duct and bile duct stents 10/04/2021 EUS 1 10/04/2021-no pancreas mass identified, FNA of pancreas head-"atypical "cells, no adenopathy CT abdomen/pelvis 10/18/2021-no focal liver abnormality, intra and extrahepatic biliary ductal dilatation with narrowing of the common duct and the pancreas head, no enlarged abdominal lymph nodes Cycle 1 FOLFIRINOX 12/27/2021, irinotecan held with cycle 1 Cycle 2 FOLFIRINOX 01/24/2022, oxaliplatin and  irinotecan dose reduced secondary to elevated liver enzymes, Udenyca Cycle 3 FOLFIRINOX 02/07/2022, same doses as cycle 2 Cycle 4 FOLFIRINOX 02/21/2022 Cycle 5 FOLFIRINOX 03/07/2022 Cycle 6 FOLFIRINOX 03/22/2022 Cycle 7 FOLFIRINOX 04/05/2022 Cycle 8 FOLFIRINOX 04/19/2022 Radiation/Xeloda 05/09/2022 Acute pancreatitis 10/04/2021 Bipolar disorder Left soleal DVT 11/26/2021-apixaban Port-A-Cath placement 12/22/2021  Atrial fibrillation 12/22/2021, converted to sinus rhythm, discharged home 12/23/2021 on metoprolol 12.5 mg twice daily, on Eliquis for prior DVT. Neutropenia secondary to chemotherapy 01/10/2022, Udenyca added with cycle 2 Elevated liver enzymes, predating chemotherapy  Disposition: Dean [name] appears stable.  He has completed 8 cycles of FOLFIRINOX.  He is scheduled to begin radiation with concurrent Xeloda 05/09/2022.  We again reviewed potential toxicities associated with Xeloda.  He agrees to proceed.  We are obtaining baseline labs today.  We will see him in follow-up in 2 weeks.    Ned Card ANP/GNP-BC   05/06/2022  9:26 AM

## 2022-05-09 ENCOUNTER — Other Ambulatory Visit: Payer: Self-pay

## 2022-05-09 ENCOUNTER — Ambulatory Visit
Admission: RE | Admit: 2022-05-09 | Discharge: 2022-05-09 | Disposition: A | Discharge status: Home / Self Care | Payer: BC Managed Care – PPO | Source: Ambulatory Visit | Attending: Radiation Oncology | Admitting: Radiation Oncology

## 2022-05-09 DIAGNOSIS — Z510 Encounter for antineoplastic radiation therapy: Secondary | ICD-10-CM | POA: Diagnosis not present

## 2022-05-09 DIAGNOSIS — Z86718 Personal history of other venous thrombosis and embolism: Secondary | ICD-10-CM | POA: Diagnosis not present

## 2022-05-09 DIAGNOSIS — I4891 Unspecified atrial fibrillation: Secondary | ICD-10-CM | POA: Diagnosis not present

## 2022-05-09 DIAGNOSIS — Z79899 Other long term (current) drug therapy: Secondary | ICD-10-CM | POA: Diagnosis not present

## 2022-05-09 DIAGNOSIS — Z7901 Long term (current) use of anticoagulants: Secondary | ICD-10-CM | POA: Diagnosis not present

## 2022-05-09 DIAGNOSIS — Z9221 Personal history of antineoplastic chemotherapy: Secondary | ICD-10-CM | POA: Diagnosis not present

## 2022-05-09 DIAGNOSIS — R748 Abnormal levels of other serum enzymes: Secondary | ICD-10-CM | POA: Diagnosis not present

## 2022-05-09 DIAGNOSIS — C250 Malignant neoplasm of head of pancreas: Secondary | ICD-10-CM | POA: Diagnosis not present

## 2022-05-09 LAB — RAD ONC ARIA SESSION SUMMARY
Course Elapsed Days: 0
Plan Fractions Treated to Date: 1
Plan Prescribed Dose Per Fraction: 1.8 Gy
Plan Total Fractions Prescribed: 25
Plan Total Prescribed Dose: 45 Gy
Reference Point Dosage Given to Date: 1.8 Gy
Reference Point Session Dosage Given: 1.8 Gy
Session Number: 1

## 2022-05-10 ENCOUNTER — Ambulatory Visit
Admission: RE | Admit: 2022-05-10 | Discharge: 2022-05-10 | Disposition: A | Discharge status: Home / Self Care | Payer: BC Managed Care – PPO | Source: Ambulatory Visit | Attending: Radiation Oncology | Admitting: Radiation Oncology

## 2022-05-10 ENCOUNTER — Other Ambulatory Visit: Payer: Self-pay

## 2022-05-10 DIAGNOSIS — Z510 Encounter for antineoplastic radiation therapy: Secondary | ICD-10-CM | POA: Diagnosis not present

## 2022-05-10 DIAGNOSIS — I4891 Unspecified atrial fibrillation: Secondary | ICD-10-CM | POA: Diagnosis not present

## 2022-05-10 DIAGNOSIS — Z86718 Personal history of other venous thrombosis and embolism: Secondary | ICD-10-CM | POA: Diagnosis not present

## 2022-05-10 DIAGNOSIS — R748 Abnormal levels of other serum enzymes: Secondary | ICD-10-CM | POA: Diagnosis not present

## 2022-05-10 DIAGNOSIS — Z79899 Other long term (current) drug therapy: Secondary | ICD-10-CM | POA: Diagnosis not present

## 2022-05-10 DIAGNOSIS — Z7901 Long term (current) use of anticoagulants: Secondary | ICD-10-CM | POA: Diagnosis not present

## 2022-05-10 DIAGNOSIS — C250 Malignant neoplasm of head of pancreas: Secondary | ICD-10-CM | POA: Diagnosis not present

## 2022-05-10 DIAGNOSIS — Z9221 Personal history of antineoplastic chemotherapy: Secondary | ICD-10-CM | POA: Diagnosis not present

## 2022-05-10 LAB — RAD ONC ARIA SESSION SUMMARY
Course Elapsed Days: 1
Plan Fractions Treated to Date: 2
Plan Prescribed Dose Per Fraction: 1.8 Gy
Plan Total Fractions Prescribed: 25
Plan Total Prescribed Dose: 45 Gy
Reference Point Dosage Given to Date: 3.6 Gy
Reference Point Session Dosage Given: 1.8 Gy
Session Number: 2

## 2022-05-11 ENCOUNTER — Ambulatory Visit
Admission: RE | Admit: 2022-05-11 | Discharge: 2022-05-11 | Disposition: A | Discharge status: Home / Self Care | Payer: BC Managed Care – PPO | Source: Ambulatory Visit | Attending: Radiation Oncology | Admitting: Radiation Oncology

## 2022-05-11 ENCOUNTER — Other Ambulatory Visit: Payer: Self-pay

## 2022-05-11 DIAGNOSIS — R748 Abnormal levels of other serum enzymes: Secondary | ICD-10-CM | POA: Diagnosis not present

## 2022-05-11 DIAGNOSIS — Z86718 Personal history of other venous thrombosis and embolism: Secondary | ICD-10-CM | POA: Diagnosis not present

## 2022-05-11 DIAGNOSIS — Z79899 Other long term (current) drug therapy: Secondary | ICD-10-CM | POA: Diagnosis not present

## 2022-05-11 DIAGNOSIS — C250 Malignant neoplasm of head of pancreas: Secondary | ICD-10-CM | POA: Diagnosis not present

## 2022-05-11 DIAGNOSIS — Z9221 Personal history of antineoplastic chemotherapy: Secondary | ICD-10-CM | POA: Diagnosis not present

## 2022-05-11 DIAGNOSIS — I4891 Unspecified atrial fibrillation: Secondary | ICD-10-CM | POA: Diagnosis not present

## 2022-05-11 DIAGNOSIS — Z510 Encounter for antineoplastic radiation therapy: Secondary | ICD-10-CM | POA: Diagnosis not present

## 2022-05-11 DIAGNOSIS — Z7901 Long term (current) use of anticoagulants: Secondary | ICD-10-CM | POA: Diagnosis not present

## 2022-05-11 LAB — RAD ONC ARIA SESSION SUMMARY
Course Elapsed Days: 2
Plan Fractions Treated to Date: 3
Plan Prescribed Dose Per Fraction: 1.8 Gy
Plan Total Fractions Prescribed: 25
Plan Total Prescribed Dose: 45 Gy
Reference Point Dosage Given to Date: 5.4 Gy
Reference Point Session Dosage Given: 1.8 Gy
Session Number: 3

## 2022-05-12 ENCOUNTER — Ambulatory Visit
Admission: RE | Admit: 2022-05-12 | Discharge: 2022-05-12 | Disposition: A | Discharge status: Home / Self Care | Payer: BC Managed Care – PPO | Source: Ambulatory Visit | Attending: Radiation Oncology | Admitting: Radiation Oncology

## 2022-05-12 ENCOUNTER — Other Ambulatory Visit: Payer: Self-pay | Admitting: Psychiatry

## 2022-05-12 ENCOUNTER — Other Ambulatory Visit: Payer: Self-pay

## 2022-05-12 DIAGNOSIS — Z510 Encounter for antineoplastic radiation therapy: Secondary | ICD-10-CM | POA: Diagnosis not present

## 2022-05-12 DIAGNOSIS — Z79899 Other long term (current) drug therapy: Secondary | ICD-10-CM | POA: Diagnosis not present

## 2022-05-12 DIAGNOSIS — Z7901 Long term (current) use of anticoagulants: Secondary | ICD-10-CM | POA: Diagnosis not present

## 2022-05-12 DIAGNOSIS — C250 Malignant neoplasm of head of pancreas: Secondary | ICD-10-CM | POA: Diagnosis not present

## 2022-05-12 DIAGNOSIS — Z86718 Personal history of other venous thrombosis and embolism: Secondary | ICD-10-CM | POA: Diagnosis not present

## 2022-05-12 DIAGNOSIS — Z9221 Personal history of antineoplastic chemotherapy: Secondary | ICD-10-CM | POA: Diagnosis not present

## 2022-05-12 DIAGNOSIS — I4891 Unspecified atrial fibrillation: Secondary | ICD-10-CM | POA: Diagnosis not present

## 2022-05-12 DIAGNOSIS — R748 Abnormal levels of other serum enzymes: Secondary | ICD-10-CM | POA: Diagnosis not present

## 2022-05-12 LAB — RAD ONC ARIA SESSION SUMMARY
Course Elapsed Days: 3
Plan Fractions Treated to Date: 4
Plan Prescribed Dose Per Fraction: 1.8 Gy
Plan Total Fractions Prescribed: 25
Plan Total Prescribed Dose: 45 Gy
Reference Point Dosage Given to Date: 7.2 Gy
Reference Point Session Dosage Given: 1.8 Gy
Session Number: 4

## 2022-05-13 ENCOUNTER — Ambulatory Visit
Admission: RE | Admit: 2022-05-13 | Discharge: 2022-05-13 | Disposition: A | Discharge status: Home / Self Care | Payer: BC Managed Care – PPO | Source: Ambulatory Visit | Attending: Radiation Oncology | Admitting: Radiation Oncology

## 2022-05-13 ENCOUNTER — Other Ambulatory Visit: Payer: Self-pay

## 2022-05-13 ENCOUNTER — Encounter: Payer: Self-pay | Admitting: Licensed Clinical Social Worker

## 2022-05-13 DIAGNOSIS — Z9221 Personal history of antineoplastic chemotherapy: Secondary | ICD-10-CM | POA: Diagnosis not present

## 2022-05-13 DIAGNOSIS — C250 Malignant neoplasm of head of pancreas: Secondary | ICD-10-CM | POA: Diagnosis not present

## 2022-05-13 DIAGNOSIS — R748 Abnormal levels of other serum enzymes: Secondary | ICD-10-CM | POA: Diagnosis not present

## 2022-05-13 DIAGNOSIS — I4891 Unspecified atrial fibrillation: Secondary | ICD-10-CM | POA: Diagnosis not present

## 2022-05-13 DIAGNOSIS — Z510 Encounter for antineoplastic radiation therapy: Secondary | ICD-10-CM | POA: Diagnosis not present

## 2022-05-13 DIAGNOSIS — Z7901 Long term (current) use of anticoagulants: Secondary | ICD-10-CM | POA: Diagnosis not present

## 2022-05-13 DIAGNOSIS — Z86718 Personal history of other venous thrombosis and embolism: Secondary | ICD-10-CM | POA: Diagnosis not present

## 2022-05-13 DIAGNOSIS — Z79899 Other long term (current) drug therapy: Secondary | ICD-10-CM | POA: Diagnosis not present

## 2022-05-13 LAB — RAD ONC ARIA SESSION SUMMARY
Course Elapsed Days: 4
Plan Fractions Treated to Date: 5
Plan Prescribed Dose Per Fraction: 1.8 Gy
Plan Total Fractions Prescribed: 25
Plan Total Prescribed Dose: 45 Gy
Reference Point Dosage Given to Date: 9 Gy
Reference Point Session Dosage Given: 1.8 Gy
Session Number: 5

## 2022-05-13 MED ORDER — SONAFINE EX EMUL
1.0000 "application " | Freq: Once | CUTANEOUS | Status: AC
  Administered 2022-05-13: 1 via TOPICAL

## 2022-05-13 NOTE — Progress Notes (Signed)
Pt here for patient teaching.  Pt given Radiation and You booklet, skin care instructions, and Sonafine.  Reviewed areas of pertinence such as diarrhea, fatigue, hair loss, nausea and vomiting, skin changes, and urinary and bladder changes . Pt able to give teach back of to pat skin, use unscented/gentle soap, use baby wipes, have Imodium on hand, drink plenty of water, and sitz bath,apply Sonafine bid and avoid applying anything to skin within 4 hours of treatment. Pt verbalizes understanding of information given and will contact nursing with any questions or concerns.    Cooper Moroney M. Tenya Araque RN, BSN  

## 2022-05-13 NOTE — Progress Notes (Signed)
[facility] CSW Progress Note  Clinical Education officer, museum contacted caregiver by phone to reschedule counseling session.  Ms. Dawson left a voicemail requesting to reschedule appointment on the 13th of June.  CSW called and left a voicemail with contact information and request for return call.   Adelene Amas, LCSW

## 2022-05-16 ENCOUNTER — Other Ambulatory Visit: Payer: Self-pay

## 2022-05-16 ENCOUNTER — Ambulatory Visit
Admission: RE | Admit: 2022-05-16 | Discharge: 2022-05-16 | Disposition: A | Discharge status: Home / Self Care | Payer: BC Managed Care – PPO | Source: Ambulatory Visit | Attending: Radiation Oncology | Admitting: Radiation Oncology

## 2022-05-16 DIAGNOSIS — R748 Abnormal levels of other serum enzymes: Secondary | ICD-10-CM | POA: Diagnosis not present

## 2022-05-16 DIAGNOSIS — C250 Malignant neoplasm of head of pancreas: Secondary | ICD-10-CM

## 2022-05-16 DIAGNOSIS — Z510 Encounter for antineoplastic radiation therapy: Secondary | ICD-10-CM | POA: Diagnosis not present

## 2022-05-16 DIAGNOSIS — I4891 Unspecified atrial fibrillation: Secondary | ICD-10-CM | POA: Diagnosis not present

## 2022-05-16 DIAGNOSIS — Z7901 Long term (current) use of anticoagulants: Secondary | ICD-10-CM | POA: Diagnosis not present

## 2022-05-16 DIAGNOSIS — Z79899 Other long term (current) drug therapy: Secondary | ICD-10-CM | POA: Diagnosis not present

## 2022-05-16 DIAGNOSIS — Z9221 Personal history of antineoplastic chemotherapy: Secondary | ICD-10-CM | POA: Diagnosis not present

## 2022-05-16 DIAGNOSIS — Z86718 Personal history of other venous thrombosis and embolism: Secondary | ICD-10-CM | POA: Diagnosis not present

## 2022-05-16 LAB — RAD ONC ARIA SESSION SUMMARY
Course Elapsed Days: 7
Plan Fractions Treated to Date: 6
Plan Prescribed Dose Per Fraction: 1.8 Gy
Plan Total Fractions Prescribed: 25
Plan Total Prescribed Dose: 45 Gy
Reference Point Dosage Given to Date: 10.8 Gy
Reference Point Session Dosage Given: 1.8 Gy
Session Number: 6

## 2022-05-16 NOTE — Progress Notes (Signed)
Opened in error

## 2022-05-17 ENCOUNTER — Inpatient Hospital Stay: Payer: BC Managed Care – PPO | Admitting: Licensed Clinical Social Worker

## 2022-05-17 ENCOUNTER — Other Ambulatory Visit: Payer: Self-pay

## 2022-05-17 ENCOUNTER — Ambulatory Visit
Admission: RE | Admit: 2022-05-17 | Discharge: 2022-05-17 | Disposition: A | Discharge status: Home / Self Care | Payer: BC Managed Care – PPO | Source: Ambulatory Visit | Attending: Radiation Oncology | Admitting: Radiation Oncology

## 2022-05-17 DIAGNOSIS — C250 Malignant neoplasm of head of pancreas: Secondary | ICD-10-CM | POA: Diagnosis not present

## 2022-05-17 DIAGNOSIS — Z86718 Personal history of other venous thrombosis and embolism: Secondary | ICD-10-CM | POA: Diagnosis not present

## 2022-05-17 DIAGNOSIS — R748 Abnormal levels of other serum enzymes: Secondary | ICD-10-CM | POA: Diagnosis not present

## 2022-05-17 DIAGNOSIS — Z510 Encounter for antineoplastic radiation therapy: Secondary | ICD-10-CM | POA: Diagnosis not present

## 2022-05-17 DIAGNOSIS — I4891 Unspecified atrial fibrillation: Secondary | ICD-10-CM | POA: Diagnosis not present

## 2022-05-17 DIAGNOSIS — Z79899 Other long term (current) drug therapy: Secondary | ICD-10-CM | POA: Diagnosis not present

## 2022-05-17 DIAGNOSIS — Z7901 Long term (current) use of anticoagulants: Secondary | ICD-10-CM | POA: Diagnosis not present

## 2022-05-17 DIAGNOSIS — Z9221 Personal history of antineoplastic chemotherapy: Secondary | ICD-10-CM | POA: Diagnosis not present

## 2022-05-17 LAB — RAD ONC ARIA SESSION SUMMARY
Course Elapsed Days: 8
Plan Fractions Treated to Date: 7
Plan Prescribed Dose Per Fraction: 1.8 Gy
Plan Total Fractions Prescribed: 25
Plan Total Prescribed Dose: 45 Gy
Reference Point Dosage Given to Date: 12.6 Gy
Reference Point Session Dosage Given: 1.8 Gy
Session Number: 7

## 2022-05-18 ENCOUNTER — Ambulatory Visit
Admission: RE | Admit: 2022-05-18 | Discharge: 2022-05-18 | Disposition: A | Discharge status: Home / Self Care | Payer: BC Managed Care – PPO | Source: Ambulatory Visit | Attending: Radiation Oncology | Admitting: Radiation Oncology

## 2022-05-18 ENCOUNTER — Other Ambulatory Visit: Payer: Self-pay

## 2022-05-18 DIAGNOSIS — Z86718 Personal history of other venous thrombosis and embolism: Secondary | ICD-10-CM | POA: Diagnosis not present

## 2022-05-18 DIAGNOSIS — R748 Abnormal levels of other serum enzymes: Secondary | ICD-10-CM | POA: Diagnosis not present

## 2022-05-18 DIAGNOSIS — Z7901 Long term (current) use of anticoagulants: Secondary | ICD-10-CM | POA: Diagnosis not present

## 2022-05-18 DIAGNOSIS — Z9221 Personal history of antineoplastic chemotherapy: Secondary | ICD-10-CM | POA: Diagnosis not present

## 2022-05-18 DIAGNOSIS — Z510 Encounter for antineoplastic radiation therapy: Secondary | ICD-10-CM | POA: Diagnosis not present

## 2022-05-18 DIAGNOSIS — Z79899 Other long term (current) drug therapy: Secondary | ICD-10-CM | POA: Diagnosis not present

## 2022-05-18 DIAGNOSIS — I4891 Unspecified atrial fibrillation: Secondary | ICD-10-CM | POA: Diagnosis not present

## 2022-05-18 DIAGNOSIS — C250 Malignant neoplasm of head of pancreas: Secondary | ICD-10-CM | POA: Diagnosis not present

## 2022-05-18 LAB — RAD ONC ARIA SESSION SUMMARY
Course Elapsed Days: 9
Plan Fractions Treated to Date: 8
Plan Prescribed Dose Per Fraction: 1.8 Gy
Plan Total Fractions Prescribed: 25
Plan Total Prescribed Dose: 45 Gy
Reference Point Dosage Given to Date: 14.4 Gy
Reference Point Session Dosage Given: 1.8 Gy
Session Number: 8

## 2022-05-19 ENCOUNTER — Ambulatory Visit
Admission: RE | Admit: 2022-05-19 | Discharge: 2022-05-19 | Disposition: A | Discharge status: Home / Self Care | Payer: BC Managed Care – PPO | Source: Ambulatory Visit | Attending: Radiation Oncology | Admitting: Radiation Oncology

## 2022-05-19 ENCOUNTER — Other Ambulatory Visit: Payer: Self-pay

## 2022-05-19 DIAGNOSIS — R748 Abnormal levels of other serum enzymes: Secondary | ICD-10-CM | POA: Diagnosis not present

## 2022-05-19 DIAGNOSIS — Z86718 Personal history of other venous thrombosis and embolism: Secondary | ICD-10-CM | POA: Diagnosis not present

## 2022-05-19 DIAGNOSIS — Z510 Encounter for antineoplastic radiation therapy: Secondary | ICD-10-CM | POA: Diagnosis not present

## 2022-05-19 DIAGNOSIS — Z7901 Long term (current) use of anticoagulants: Secondary | ICD-10-CM | POA: Diagnosis not present

## 2022-05-19 DIAGNOSIS — I4891 Unspecified atrial fibrillation: Secondary | ICD-10-CM | POA: Diagnosis not present

## 2022-05-19 DIAGNOSIS — Z79899 Other long term (current) drug therapy: Secondary | ICD-10-CM | POA: Diagnosis not present

## 2022-05-19 DIAGNOSIS — C250 Malignant neoplasm of head of pancreas: Secondary | ICD-10-CM | POA: Diagnosis not present

## 2022-05-19 DIAGNOSIS — Z9221 Personal history of antineoplastic chemotherapy: Secondary | ICD-10-CM | POA: Diagnosis not present

## 2022-05-19 LAB — RAD ONC ARIA SESSION SUMMARY
Course Elapsed Days: 10
Plan Fractions Treated to Date: 9
Plan Prescribed Dose Per Fraction: 1.8 Gy
Plan Total Fractions Prescribed: 25
Plan Total Prescribed Dose: 45 Gy
Reference Point Dosage Given to Date: 16.2 Gy
Reference Point Session Dosage Given: 1.8 Gy
Session Number: 9

## 2022-05-20 ENCOUNTER — Inpatient Hospital Stay: Payer: BC Managed Care – PPO

## 2022-05-20 ENCOUNTER — Inpatient Hospital Stay (HOSPITAL_BASED_OUTPATIENT_CLINIC_OR_DEPARTMENT_OTHER): Payer: BC Managed Care – PPO | Admitting: Nurse Practitioner

## 2022-05-20 ENCOUNTER — Encounter: Payer: Self-pay | Admitting: Nurse Practitioner

## 2022-05-20 ENCOUNTER — Ambulatory Visit
Admission: RE | Admit: 2022-05-20 | Discharge: 2022-05-20 | Disposition: A | Discharge status: Home / Self Care | Payer: BC Managed Care – PPO | Source: Ambulatory Visit | Attending: Radiation Oncology | Admitting: Radiation Oncology

## 2022-05-20 ENCOUNTER — Other Ambulatory Visit: Payer: Self-pay

## 2022-05-20 VITALS — BP 112/71 | HR 58 | Temp 98.2°F | Resp 18 | Ht 71.0 in | Wt 154.0 lb

## 2022-05-20 DIAGNOSIS — Z79899 Other long term (current) drug therapy: Secondary | ICD-10-CM | POA: Diagnosis not present

## 2022-05-20 DIAGNOSIS — Z9221 Personal history of antineoplastic chemotherapy: Secondary | ICD-10-CM | POA: Diagnosis not present

## 2022-05-20 DIAGNOSIS — Z7901 Long term (current) use of anticoagulants: Secondary | ICD-10-CM | POA: Diagnosis not present

## 2022-05-20 DIAGNOSIS — C250 Malignant neoplasm of head of pancreas: Secondary | ICD-10-CM

## 2022-05-20 DIAGNOSIS — I82409 Acute embolism and thrombosis of unspecified deep veins of unspecified lower extremity: Secondary | ICD-10-CM

## 2022-05-20 DIAGNOSIS — Z510 Encounter for antineoplastic radiation therapy: Secondary | ICD-10-CM | POA: Diagnosis not present

## 2022-05-20 DIAGNOSIS — Z86718 Personal history of other venous thrombosis and embolism: Secondary | ICD-10-CM | POA: Diagnosis not present

## 2022-05-20 DIAGNOSIS — I4891 Unspecified atrial fibrillation: Secondary | ICD-10-CM | POA: Diagnosis not present

## 2022-05-20 DIAGNOSIS — R748 Abnormal levels of other serum enzymes: Secondary | ICD-10-CM | POA: Diagnosis not present

## 2022-05-20 LAB — CBC WITH DIFFERENTIAL (CANCER CENTER ONLY)
Abs Immature Granulocytes: 0 10*3/uL (ref 0.00–0.07)
Basophils Absolute: 0 10*3/uL (ref 0.0–0.1)
Basophils Relative: 0 %
Eosinophils Absolute: 0.1 10*3/uL (ref 0.0–0.5)
Eosinophils Relative: 2 %
HCT: 30.5 % — ABNORMAL LOW (ref 39.0–52.0)
Hemoglobin: 10.2 g/dL — ABNORMAL LOW (ref 13.0–17.0)
Immature Granulocytes: 0 %
Lymphocytes Relative: 13 %
Lymphs Abs: 0.4 10*3/uL — ABNORMAL LOW (ref 0.7–4.0)
MCH: 35.9 pg — ABNORMAL HIGH (ref 26.0–34.0)
MCHC: 33.4 g/dL (ref 30.0–36.0)
MCV: 107.4 fL — ABNORMAL HIGH (ref 80.0–100.0)
Monocytes Absolute: 0.4 10*3/uL (ref 0.1–1.0)
Monocytes Relative: 16 %
Neutro Abs: 1.9 10*3/uL (ref 1.7–7.7)
Neutrophils Relative %: 69 %
Platelet Count: 83 10*3/uL — ABNORMAL LOW (ref 150–400)
RBC: 2.84 MIL/uL — ABNORMAL LOW (ref 4.22–5.81)
RDW: 13.6 % (ref 11.5–15.5)
WBC Count: 2.8 10*3/uL — ABNORMAL LOW (ref 4.0–10.5)
nRBC: 0 % (ref 0.0–0.2)

## 2022-05-20 LAB — RAD ONC ARIA SESSION SUMMARY
Course Elapsed Days: 11
Plan Fractions Treated to Date: 10
Plan Prescribed Dose Per Fraction: 1.8 Gy
Plan Total Fractions Prescribed: 25
Plan Total Prescribed Dose: 45 Gy
Reference Point Dosage Given to Date: 18 Gy
Reference Point Session Dosage Given: 1.8 Gy
Session Number: 10

## 2022-05-20 LAB — CMP (CANCER CENTER ONLY)
ALT: 45 U/L — ABNORMAL HIGH (ref 0–44)
AST: 34 U/L (ref 15–41)
Albumin: 3.8 g/dL (ref 3.5–5.0)
Alkaline Phosphatase: 225 U/L — ABNORMAL HIGH (ref 38–126)
Anion gap: 7 (ref 5–15)
BUN: 15 mg/dL (ref 6–20)
CO2: 28 mmol/L (ref 22–32)
Calcium: 9.7 mg/dL (ref 8.9–10.3)
Chloride: 103 mmol/L (ref 98–111)
Creatinine: 0.7 mg/dL (ref 0.61–1.24)
GFR, Estimated: >60 mL/min (ref >60)
Glucose, Bld: 92 mg/dL (ref 70–99)
Potassium: 4 mmol/L (ref 3.5–5.1)
Sodium: 138 mmol/L (ref 135–145)
Total Bilirubin: 0.5 mg/dL (ref 0.3–1.2)
Total Protein: 6.1 g/dL — ABNORMAL LOW (ref 6.5–8.1)

## 2022-05-20 MED ORDER — APIXABAN 5 MG PO TABS: 5.0000 mg | ORAL_TABLET | Freq: Two times a day (BID) | ORAL | 1 refills | Status: DC

## 2022-05-20 MED ORDER — SODIUM CHLORIDE 0.9% FLUSH
10.0000 mL | Freq: Once | INTRAVENOUS | Status: AC
  Administered 2022-05-20: 10 mL via INTRAVENOUS

## 2022-05-20 MED ORDER — HEPARIN SOD (PORK) LOCK FLUSH 100 UNIT/ML IV SOLN
500.0000 [IU] | Freq: Once | INTRAVENOUS | Status: AC
  Administered 2022-05-20: 500 [IU] via INTRAVENOUS

## 2022-05-20 NOTE — Progress Notes (Signed)
  [place] OFFICE PROGRESS NOTE   Diagnosis:  Pancreas cancer  INTERVAL HISTORY:   Mr. Partington returns as scheduled.  He continues radiation/Xeloda.  He overall feels well.  He tends to have mild nausea around lunchtime.  No mouth sores.  No diarrhea.  No hand or foot pain or redness.  He periodically notes a "stinging" sensation to the left of the Port-A-Cath with certain movements of his arm.  He denies any discomfort when the port was flushed earlier today.  No arm or neck swelling.  Objective:  Vital signs in last 24 hours:  Blood pressure 112/71, pulse (!) 58, temperature 98.2 F (36.8 C), temperature source Oral, resp. rate 18, height '5\' 11"'$  (1.803 m), weight 154 lb (69.9 kg), SpO2 100 %.    HEENT: No thrush or ulcers. Resp: Lungs clear bilaterally. Cardio: Regular rate and rhythm. GI: Abdomen soft and nontender.  No hepatosplenomegaly. Vascular: No upper extremity or lower extremity edema. Skin: Palms without erythema. Port-A-Cath is without erythema.  Nontender.  No apparent left neck edema.   Lab Results:  Lab Results  Component Value Date   WBC 2.8 (L) 05/20/2022   HGB 10.2 (L) 05/20/2022   HCT 30.5 (L) 05/20/2022   MCV 107.4 (H) 05/20/2022   PLT 83 (L) 05/20/2022   NEUTROABS 1.9 05/20/2022    Imaging:  No results found.  Medications: I have reviewed the patient's current medications.  Assessment/Plan: Pancreas cancer, status post a pancreaticoduodenectomy 11/12/2021, stage IIb (pT1,pN1) 1/11 lymph nodes, perineural invasion positive, positive margin at the SMA and SMV MRCP 09/27/2021-diffuse intrahepatic and common bile duct dilatation with abrupt termination at the level of the head of the pancreas, indistinct area of hypoenhancement in the pancreas head ERCP with placement of pancreatic duct and bile duct stents 10/04/2021 EUS 1 10/04/2021-no pancreas mass identified, FNA of pancreas head-"atypical "cells, no adenopathy CT  abdomen/pelvis 10/18/2021-no focal liver abnormality, intra and extrahepatic biliary ductal dilatation with narrowing of the common duct and the pancreas head, no enlarged abdominal lymph nodes Cycle 1 FOLFIRINOX 12/27/2021, irinotecan held with cycle 1 Cycle 2 FOLFIRINOX 01/24/2022, oxaliplatin and irinotecan dose reduced secondary to elevated liver enzymes, Udenyca Cycle 3 FOLFIRINOX 02/07/2022, same doses as cycle 2 Cycle 4 FOLFIRINOX 02/21/2022 Cycle 5 FOLFIRINOX 03/07/2022 Cycle 6 FOLFIRINOX 03/22/2022 Cycle 7 FOLFIRINOX 04/05/2022 Cycle 8 FOLFIRINOX 04/19/2022 Radiation/Xeloda 05/09/2022 Acute pancreatitis 10/04/2021 Bipolar disorder Left soleal DVT 11/26/2021-apixaban Port-A-Cath placement 12/22/2021  Atrial fibrillation 12/22/2021, converted to sinus rhythm, discharged home 12/23/2021 on metoprolol 12.5 mg twice daily, on Eliquis for prior DVT. Neutropenia secondary to chemotherapy 01/10/2022, Udenyca added with cycle 2 Elevated liver enzymes, predating chemotherapy  Disposition: Mr. Kaufman appears stable.  He will continue radiation/Xeloda.  We reviewed the CBC from today.  He has progressive thrombocytopenia.  This may be related to the previous chemotherapy.  He understands to contact the office with bleeding.  We will obtain a repeat CBC in approximately 10 days.  He will return for follow-up in approximately 3 weeks.  We are available to see him sooner if needed.    Ned Card ANP/GNP-BC   05/20/2022  8:54 AM

## 2022-05-23 ENCOUNTER — Other Ambulatory Visit: Payer: Self-pay

## 2022-05-23 ENCOUNTER — Ambulatory Visit
Admission: RE | Admit: 2022-05-23 | Discharge: 2022-05-23 | Disposition: A | Discharge status: Home / Self Care | Payer: BC Managed Care – PPO | Source: Ambulatory Visit | Attending: Radiation Oncology | Admitting: Radiation Oncology

## 2022-05-23 DIAGNOSIS — Z510 Encounter for antineoplastic radiation therapy: Secondary | ICD-10-CM | POA: Diagnosis not present

## 2022-05-23 DIAGNOSIS — C250 Malignant neoplasm of head of pancreas: Secondary | ICD-10-CM | POA: Diagnosis not present

## 2022-05-23 DIAGNOSIS — Z79899 Other long term (current) drug therapy: Secondary | ICD-10-CM | POA: Diagnosis not present

## 2022-05-23 DIAGNOSIS — Z9221 Personal history of antineoplastic chemotherapy: Secondary | ICD-10-CM | POA: Diagnosis not present

## 2022-05-23 DIAGNOSIS — I4891 Unspecified atrial fibrillation: Secondary | ICD-10-CM | POA: Diagnosis not present

## 2022-05-23 DIAGNOSIS — R748 Abnormal levels of other serum enzymes: Secondary | ICD-10-CM | POA: Diagnosis not present

## 2022-05-23 DIAGNOSIS — Z86718 Personal history of other venous thrombosis and embolism: Secondary | ICD-10-CM | POA: Diagnosis not present

## 2022-05-23 DIAGNOSIS — Z7901 Long term (current) use of anticoagulants: Secondary | ICD-10-CM | POA: Diagnosis not present

## 2022-05-23 LAB — RAD ONC ARIA SESSION SUMMARY
Course Elapsed Days: 14
Plan Fractions Treated to Date: 11
Plan Prescribed Dose Per Fraction: 1.8 Gy
Plan Total Fractions Prescribed: 25
Plan Total Prescribed Dose: 45 Gy
Reference Point Dosage Given to Date: 19.8 Gy
Reference Point Session Dosage Given: 1.8 Gy
Session Number: 11

## 2022-05-24 ENCOUNTER — Other Ambulatory Visit: Payer: Self-pay

## 2022-05-24 ENCOUNTER — Ambulatory Visit
Admission: RE | Admit: 2022-05-24 | Discharge: 2022-05-24 | Disposition: A | Discharge status: Home / Self Care | Payer: BC Managed Care – PPO | Source: Ambulatory Visit | Attending: Radiation Oncology | Admitting: Radiation Oncology

## 2022-05-24 ENCOUNTER — Inpatient Hospital Stay: Payer: BC Managed Care – PPO

## 2022-05-24 ENCOUNTER — Other Ambulatory Visit: Payer: Self-pay | Admitting: Oncology

## 2022-05-24 DIAGNOSIS — R748 Abnormal levels of other serum enzymes: Secondary | ICD-10-CM | POA: Diagnosis not present

## 2022-05-24 DIAGNOSIS — Z9221 Personal history of antineoplastic chemotherapy: Secondary | ICD-10-CM | POA: Diagnosis not present

## 2022-05-24 DIAGNOSIS — Z7901 Long term (current) use of anticoagulants: Secondary | ICD-10-CM | POA: Diagnosis not present

## 2022-05-24 DIAGNOSIS — Z510 Encounter for antineoplastic radiation therapy: Secondary | ICD-10-CM | POA: Diagnosis not present

## 2022-05-24 DIAGNOSIS — I4891 Unspecified atrial fibrillation: Secondary | ICD-10-CM | POA: Diagnosis not present

## 2022-05-24 DIAGNOSIS — C250 Malignant neoplasm of head of pancreas: Secondary | ICD-10-CM | POA: Diagnosis not present

## 2022-05-24 DIAGNOSIS — Z79899 Other long term (current) drug therapy: Secondary | ICD-10-CM | POA: Diagnosis not present

## 2022-05-24 DIAGNOSIS — Z86718 Personal history of other venous thrombosis and embolism: Secondary | ICD-10-CM | POA: Diagnosis not present

## 2022-05-24 LAB — RAD ONC ARIA SESSION SUMMARY
Course Elapsed Days: 15
Plan Fractions Treated to Date: 12
Plan Prescribed Dose Per Fraction: 1.8 Gy
Plan Total Fractions Prescribed: 25
Plan Total Prescribed Dose: 45 Gy
Reference Point Dosage Given to Date: 21.6 Gy
Reference Point Session Dosage Given: 1.8 Gy
Session Number: 12

## 2022-05-24 NOTE — Progress Notes (Signed)
[place] CSW Progress Note  Clinical Education officer, museum contacted caregiver by phone to discuss appointment for today at [place] stated she needed to reschedule.  CSW to meet with caregiver on Thursday, 06/02/22 at [place] at [facility].    [name] Raileigh Sabater, LCSW

## 2022-05-25 ENCOUNTER — Other Ambulatory Visit: Payer: Self-pay

## 2022-05-25 ENCOUNTER — Ambulatory Visit
Admission: RE | Admit: 2022-05-25 | Discharge: 2022-05-25 | Disposition: A | Discharge status: Home / Self Care | Payer: BC Managed Care – PPO | Source: Ambulatory Visit | Attending: Radiation Oncology | Admitting: Radiation Oncology

## 2022-05-25 ENCOUNTER — Other Ambulatory Visit: Payer: BC Managed Care – PPO | Admitting: Licensed Clinical Social Worker

## 2022-05-25 DIAGNOSIS — I4891 Unspecified atrial fibrillation: Secondary | ICD-10-CM | POA: Diagnosis not present

## 2022-05-25 DIAGNOSIS — Z79899 Other long term (current) drug therapy: Secondary | ICD-10-CM | POA: Diagnosis not present

## 2022-05-25 DIAGNOSIS — Z9221 Personal history of antineoplastic chemotherapy: Secondary | ICD-10-CM | POA: Diagnosis not present

## 2022-05-25 DIAGNOSIS — C250 Malignant neoplasm of head of pancreas: Secondary | ICD-10-CM | POA: Diagnosis not present

## 2022-05-25 DIAGNOSIS — Z7901 Long term (current) use of anticoagulants: Secondary | ICD-10-CM | POA: Diagnosis not present

## 2022-05-25 DIAGNOSIS — Z86718 Personal history of other venous thrombosis and embolism: Secondary | ICD-10-CM | POA: Diagnosis not present

## 2022-05-25 DIAGNOSIS — R748 Abnormal levels of other serum enzymes: Secondary | ICD-10-CM | POA: Diagnosis not present

## 2022-05-25 DIAGNOSIS — Z510 Encounter for antineoplastic radiation therapy: Secondary | ICD-10-CM | POA: Diagnosis not present

## 2022-05-25 LAB — RAD ONC ARIA SESSION SUMMARY
Course Elapsed Days: 16
Plan Fractions Treated to Date: 13
Plan Prescribed Dose Per Fraction: 1.8 Gy
Plan Total Fractions Prescribed: 25
Plan Total Prescribed Dose: 45 Gy
Reference Point Dosage Given to Date: 23.4 Gy
Reference Point Session Dosage Given: 1.8 Gy
Session Number: 13

## 2022-05-26 ENCOUNTER — Other Ambulatory Visit: Payer: Self-pay

## 2022-05-26 ENCOUNTER — Ambulatory Visit
Admission: RE | Admit: 2022-05-26 | Discharge: 2022-05-26 | Disposition: A | Discharge status: Home / Self Care | Payer: BC Managed Care – PPO | Source: Ambulatory Visit | Attending: Radiation Oncology | Admitting: Radiation Oncology

## 2022-05-26 DIAGNOSIS — R748 Abnormal levels of other serum enzymes: Secondary | ICD-10-CM | POA: Diagnosis not present

## 2022-05-26 DIAGNOSIS — C250 Malignant neoplasm of head of pancreas: Secondary | ICD-10-CM | POA: Diagnosis not present

## 2022-05-26 DIAGNOSIS — Z79899 Other long term (current) drug therapy: Secondary | ICD-10-CM | POA: Diagnosis not present

## 2022-05-26 DIAGNOSIS — I4891 Unspecified atrial fibrillation: Secondary | ICD-10-CM | POA: Diagnosis not present

## 2022-05-26 DIAGNOSIS — Z9221 Personal history of antineoplastic chemotherapy: Secondary | ICD-10-CM | POA: Diagnosis not present

## 2022-05-26 DIAGNOSIS — Z7901 Long term (current) use of anticoagulants: Secondary | ICD-10-CM | POA: Diagnosis not present

## 2022-05-26 DIAGNOSIS — Z510 Encounter for antineoplastic radiation therapy: Secondary | ICD-10-CM | POA: Diagnosis not present

## 2022-05-26 DIAGNOSIS — Z86718 Personal history of other venous thrombosis and embolism: Secondary | ICD-10-CM | POA: Diagnosis not present

## 2022-05-26 LAB — RAD ONC ARIA SESSION SUMMARY
Course Elapsed Days: 17
Plan Fractions Treated to Date: 14
Plan Prescribed Dose Per Fraction: 1.8 Gy
Plan Total Fractions Prescribed: 25
Plan Total Prescribed Dose: 45 Gy
Reference Point Dosage Given to Date: 25.2 Gy
Reference Point Session Dosage Given: 1.8 Gy
Session Number: 14

## 2022-05-27 ENCOUNTER — Other Ambulatory Visit: Payer: Self-pay

## 2022-05-27 ENCOUNTER — Ambulatory Visit
Admission: RE | Admit: 2022-05-27 | Discharge: 2022-05-27 | Disposition: A | Discharge status: Home / Self Care | Payer: BC Managed Care – PPO | Source: Ambulatory Visit | Attending: Radiation Oncology | Admitting: Radiation Oncology

## 2022-05-27 ENCOUNTER — Telehealth: Payer: Self-pay | Admitting: *Deleted

## 2022-05-27 DIAGNOSIS — Z86718 Personal history of other venous thrombosis and embolism: Secondary | ICD-10-CM | POA: Diagnosis not present

## 2022-05-27 DIAGNOSIS — Z7901 Long term (current) use of anticoagulants: Secondary | ICD-10-CM | POA: Diagnosis not present

## 2022-05-27 DIAGNOSIS — R748 Abnormal levels of other serum enzymes: Secondary | ICD-10-CM | POA: Diagnosis not present

## 2022-05-27 DIAGNOSIS — I4891 Unspecified atrial fibrillation: Secondary | ICD-10-CM | POA: Diagnosis not present

## 2022-05-27 DIAGNOSIS — Z9221 Personal history of antineoplastic chemotherapy: Secondary | ICD-10-CM | POA: Diagnosis not present

## 2022-05-27 DIAGNOSIS — C250 Malignant neoplasm of head of pancreas: Secondary | ICD-10-CM | POA: Diagnosis not present

## 2022-05-27 DIAGNOSIS — Z79899 Other long term (current) drug therapy: Secondary | ICD-10-CM | POA: Diagnosis not present

## 2022-05-27 DIAGNOSIS — Z510 Encounter for antineoplastic radiation therapy: Secondary | ICD-10-CM | POA: Diagnosis not present

## 2022-05-27 LAB — RAD ONC ARIA SESSION SUMMARY
Course Elapsed Days: 18
Plan Fractions Treated to Date: 15
Plan Prescribed Dose Per Fraction: 1.8 Gy
Plan Total Fractions Prescribed: 25
Plan Total Prescribed Dose: 45 Gy
Reference Point Dosage Given to Date: 27 Gy
Reference Point Session Dosage Given: 1.8 Gy
Session Number: 15

## 2022-05-30 ENCOUNTER — Other Ambulatory Visit: Payer: Self-pay

## 2022-05-30 ENCOUNTER — Ambulatory Visit
Admission: RE | Admit: 2022-05-30 | Discharge: 2022-05-30 | Disposition: A | Discharge status: Home / Self Care | Payer: BC Managed Care – PPO | Source: Ambulatory Visit | Attending: Radiation Oncology | Admitting: Radiation Oncology

## 2022-05-30 ENCOUNTER — Inpatient Hospital Stay: Payer: BC Managed Care – PPO

## 2022-05-30 DIAGNOSIS — Z7901 Long term (current) use of anticoagulants: Secondary | ICD-10-CM | POA: Diagnosis not present

## 2022-05-30 DIAGNOSIS — Z86718 Personal history of other venous thrombosis and embolism: Secondary | ICD-10-CM | POA: Diagnosis not present

## 2022-05-30 DIAGNOSIS — Z79899 Other long term (current) drug therapy: Secondary | ICD-10-CM | POA: Diagnosis not present

## 2022-05-30 DIAGNOSIS — R748 Abnormal levels of other serum enzymes: Secondary | ICD-10-CM | POA: Diagnosis not present

## 2022-05-30 DIAGNOSIS — C250 Malignant neoplasm of head of pancreas: Secondary | ICD-10-CM | POA: Diagnosis not present

## 2022-05-30 DIAGNOSIS — I82409 Acute embolism and thrombosis of unspecified deep veins of unspecified lower extremity: Secondary | ICD-10-CM

## 2022-05-30 DIAGNOSIS — Z510 Encounter for antineoplastic radiation therapy: Secondary | ICD-10-CM | POA: Diagnosis not present

## 2022-05-30 DIAGNOSIS — Z9221 Personal history of antineoplastic chemotherapy: Secondary | ICD-10-CM | POA: Diagnosis not present

## 2022-05-30 DIAGNOSIS — I4891 Unspecified atrial fibrillation: Secondary | ICD-10-CM | POA: Diagnosis not present

## 2022-05-30 LAB — CMP (CANCER CENTER ONLY)
ALT: 48 U/L — ABNORMAL HIGH (ref 0–44)
AST: 55 U/L — ABNORMAL HIGH (ref 15–41)
Albumin: 3.6 g/dL (ref 3.5–5.0)
Alkaline Phosphatase: 239 U/L — ABNORMAL HIGH (ref 38–126)
Anion gap: 4 — ABNORMAL LOW (ref 5–15)
BUN: 16 mg/dL (ref 6–20)
CO2: 26 mmol/L (ref 22–32)
Calcium: 9.2 mg/dL (ref 8.9–10.3)
Chloride: 107 mmol/L (ref 98–111)
Creatinine: 0.86 mg/dL (ref 0.61–1.24)
GFR, Estimated: >60 mL/min (ref >60)
Glucose, Bld: 152 mg/dL — ABNORMAL HIGH (ref 70–99)
Potassium: 3.5 mmol/L (ref 3.5–5.1)
Sodium: 137 mmol/L (ref 135–145)
Total Bilirubin: 0.5 mg/dL (ref 0.3–1.2)
Total Protein: 6.1 g/dL — ABNORMAL LOW (ref 6.5–8.1)

## 2022-05-30 LAB — CBC WITH DIFFERENTIAL (CANCER CENTER ONLY)
Abs Immature Granulocytes: 0.01 10*3/uL (ref 0.00–0.07)
Basophils Absolute: 0 10*3/uL (ref 0.0–0.1)
Basophils Relative: 1 %
Eosinophils Absolute: 0.1 10*3/uL (ref 0.0–0.5)
Eosinophils Relative: 7 %
HCT: 31.9 % — ABNORMAL LOW (ref 39.0–52.0)
Hemoglobin: 10.9 g/dL — ABNORMAL LOW (ref 13.0–17.0)
Immature Granulocytes: 1 %
Lymphocytes Relative: 7 %
Lymphs Abs: 0.2 10*3/uL — ABNORMAL LOW (ref 0.7–4.0)
MCH: 36.6 pg — ABNORMAL HIGH (ref 26.0–34.0)
MCHC: 34.2 g/dL (ref 30.0–36.0)
MCV: 107 fL — ABNORMAL HIGH (ref 80.0–100.0)
Monocytes Absolute: 0.3 10*3/uL (ref 0.1–1.0)
Monocytes Relative: 13 %
Neutro Abs: 1.5 10*3/uL — ABNORMAL LOW (ref 1.7–7.7)
Neutrophils Relative %: 71 %
Platelet Count: 77 10*3/uL — ABNORMAL LOW (ref 150–400)
RBC: 2.98 MIL/uL — ABNORMAL LOW (ref 4.22–5.81)
RDW: 13.9 % (ref 11.5–15.5)
WBC Count: 2.1 10*3/uL — ABNORMAL LOW (ref 4.0–10.5)
nRBC: 0 % (ref 0.0–0.2)

## 2022-05-30 LAB — RAD ONC ARIA SESSION SUMMARY
Course Elapsed Days: 21
Plan Fractions Treated to Date: 16
Plan Prescribed Dose Per Fraction: 1.8 Gy
Plan Total Fractions Prescribed: 25
Plan Total Prescribed Dose: 45 Gy
Reference Point Dosage Given to Date: 28.8 Gy
Reference Point Session Dosage Given: 1.8 Gy
Session Number: 16

## 2022-05-31 ENCOUNTER — Ambulatory Visit
Admission: RE | Admit: 2022-05-31 | Discharge: 2022-05-31 | Disposition: A | Discharge status: Home / Self Care | Payer: BC Managed Care – PPO | Source: Ambulatory Visit | Attending: Radiation Oncology | Admitting: Radiation Oncology

## 2022-05-31 ENCOUNTER — Other Ambulatory Visit: Payer: Self-pay

## 2022-05-31 DIAGNOSIS — I4891 Unspecified atrial fibrillation: Secondary | ICD-10-CM | POA: Diagnosis not present

## 2022-05-31 DIAGNOSIS — C250 Malignant neoplasm of head of pancreas: Secondary | ICD-10-CM | POA: Diagnosis not present

## 2022-05-31 DIAGNOSIS — Z86718 Personal history of other venous thrombosis and embolism: Secondary | ICD-10-CM | POA: Diagnosis not present

## 2022-05-31 DIAGNOSIS — Z79899 Other long term (current) drug therapy: Secondary | ICD-10-CM | POA: Diagnosis not present

## 2022-05-31 DIAGNOSIS — Z7901 Long term (current) use of anticoagulants: Secondary | ICD-10-CM | POA: Diagnosis not present

## 2022-05-31 DIAGNOSIS — R748 Abnormal levels of other serum enzymes: Secondary | ICD-10-CM | POA: Diagnosis not present

## 2022-05-31 DIAGNOSIS — Z510 Encounter for antineoplastic radiation therapy: Secondary | ICD-10-CM | POA: Diagnosis not present

## 2022-05-31 DIAGNOSIS — Z9221 Personal history of antineoplastic chemotherapy: Secondary | ICD-10-CM | POA: Diagnosis not present

## 2022-05-31 LAB — RAD ONC ARIA SESSION SUMMARY
Course Elapsed Days: 22
Plan Fractions Treated to Date: 17
Plan Prescribed Dose Per Fraction: 1.8 Gy
Plan Total Fractions Prescribed: 25
Plan Total Prescribed Dose: 45 Gy
Reference Point Dosage Given to Date: 30.6 Gy
Reference Point Session Dosage Given: 1.8 Gy
Session Number: 17

## 2022-06-01 ENCOUNTER — Other Ambulatory Visit: Payer: Self-pay

## 2022-06-01 ENCOUNTER — Ambulatory Visit
Admission: RE | Admit: 2022-06-01 | Discharge: 2022-06-01 | Disposition: A | Discharge status: Home / Self Care | Payer: BC Managed Care – PPO | Source: Ambulatory Visit | Attending: Radiation Oncology | Admitting: Radiation Oncology

## 2022-06-01 DIAGNOSIS — C250 Malignant neoplasm of head of pancreas: Secondary | ICD-10-CM | POA: Diagnosis not present

## 2022-06-01 DIAGNOSIS — Z7901 Long term (current) use of anticoagulants: Secondary | ICD-10-CM | POA: Diagnosis not present

## 2022-06-01 DIAGNOSIS — Z9221 Personal history of antineoplastic chemotherapy: Secondary | ICD-10-CM | POA: Diagnosis not present

## 2022-06-01 DIAGNOSIS — Z86718 Personal history of other venous thrombosis and embolism: Secondary | ICD-10-CM | POA: Diagnosis not present

## 2022-06-01 DIAGNOSIS — R748 Abnormal levels of other serum enzymes: Secondary | ICD-10-CM | POA: Diagnosis not present

## 2022-06-01 DIAGNOSIS — Z79899 Other long term (current) drug therapy: Secondary | ICD-10-CM | POA: Diagnosis not present

## 2022-06-01 DIAGNOSIS — Z510 Encounter for antineoplastic radiation therapy: Secondary | ICD-10-CM | POA: Diagnosis not present

## 2022-06-01 DIAGNOSIS — I4891 Unspecified atrial fibrillation: Secondary | ICD-10-CM | POA: Diagnosis not present

## 2022-06-01 LAB — RAD ONC ARIA SESSION SUMMARY
Course Elapsed Days: 23
Plan Fractions Treated to Date: 18
Plan Prescribed Dose Per Fraction: 1.8 Gy
Plan Total Fractions Prescribed: 25
Plan Total Prescribed Dose: 45 Gy
Reference Point Dosage Given to Date: 32.4 Gy
Reference Point Session Dosage Given: 1.8 Gy
Session Number: 18

## 2022-06-02 ENCOUNTER — Ambulatory Visit
Admission: RE | Admit: 2022-06-02 | Discharge: 2022-06-02 | Disposition: A | Discharge status: Home / Self Care | Payer: BC Managed Care – PPO | Source: Ambulatory Visit | Attending: Radiation Oncology | Admitting: Radiation Oncology

## 2022-06-02 ENCOUNTER — Other Ambulatory Visit: Payer: Self-pay

## 2022-06-02 ENCOUNTER — Inpatient Hospital Stay: Payer: BC Managed Care – PPO

## 2022-06-02 DIAGNOSIS — Z86718 Personal history of other venous thrombosis and embolism: Secondary | ICD-10-CM | POA: Diagnosis not present

## 2022-06-02 DIAGNOSIS — C250 Malignant neoplasm of head of pancreas: Secondary | ICD-10-CM | POA: Diagnosis not present

## 2022-06-02 DIAGNOSIS — Z9221 Personal history of antineoplastic chemotherapy: Secondary | ICD-10-CM | POA: Diagnosis not present

## 2022-06-02 DIAGNOSIS — I4891 Unspecified atrial fibrillation: Secondary | ICD-10-CM | POA: Diagnosis not present

## 2022-06-02 DIAGNOSIS — Z510 Encounter for antineoplastic radiation therapy: Secondary | ICD-10-CM | POA: Diagnosis not present

## 2022-06-02 DIAGNOSIS — Z7901 Long term (current) use of anticoagulants: Secondary | ICD-10-CM | POA: Diagnosis not present

## 2022-06-02 DIAGNOSIS — Z79899 Other long term (current) drug therapy: Secondary | ICD-10-CM | POA: Diagnosis not present

## 2022-06-02 DIAGNOSIS — R748 Abnormal levels of other serum enzymes: Secondary | ICD-10-CM | POA: Diagnosis not present

## 2022-06-02 LAB — RAD ONC ARIA SESSION SUMMARY
Course Elapsed Days: 24
Plan Fractions Treated to Date: 19
Plan Prescribed Dose Per Fraction: 1.8 Gy
Plan Total Fractions Prescribed: 25
Plan Total Prescribed Dose: 45 Gy
Reference Point Dosage Given to Date: 34.2 Gy
Reference Point Session Dosage Given: 1.8 Gy
Session Number: 19

## 2022-06-02 NOTE — Progress Notes (Signed)
[place] CSW Progress Note  Holiday representative met with caregiver to discuss adjustment concerns.  Mrs. Turko stated she was coping better than previously reported.  Meeting with Adelene Amas, LCSW, assisted Mrs. Joyce in the past.  She feels the real challenge will occur after he has completed his treatment.  Their son is having difficulty adjusting to patient's diagnosis and treatment.  Mrs. Dauphinais participates in self care such as journaling and expressive  arts.  CSW provided education regarding the [facility] and their schedule of activities.  She said she was very interested and plans on attending some of the programs.  Mrs. Primm provided additional psychosocial information.  The couple has overcome many hurdles in their lives.  Mrs. Mcdanel has several supportive relationships, including friends at work.  She displayed insightfulness and strength.  Plan is for her to contact CSW if needed.   [name] Rikita Grabert, LCSW

## 2022-06-03 ENCOUNTER — Ambulatory Visit
Admission: RE | Admit: 2022-06-03 | Discharge: 2022-06-03 | Disposition: A | Discharge status: Home / Self Care | Payer: BC Managed Care – PPO | Source: Ambulatory Visit | Attending: Radiation Oncology | Admitting: Radiation Oncology

## 2022-06-03 ENCOUNTER — Other Ambulatory Visit: Payer: Self-pay

## 2022-06-03 DIAGNOSIS — Z86718 Personal history of other venous thrombosis and embolism: Secondary | ICD-10-CM | POA: Diagnosis not present

## 2022-06-03 DIAGNOSIS — Z79899 Other long term (current) drug therapy: Secondary | ICD-10-CM | POA: Diagnosis not present

## 2022-06-03 DIAGNOSIS — I4891 Unspecified atrial fibrillation: Secondary | ICD-10-CM | POA: Diagnosis not present

## 2022-06-03 DIAGNOSIS — Z9221 Personal history of antineoplastic chemotherapy: Secondary | ICD-10-CM | POA: Diagnosis not present

## 2022-06-03 DIAGNOSIS — R748 Abnormal levels of other serum enzymes: Secondary | ICD-10-CM | POA: Diagnosis not present

## 2022-06-03 DIAGNOSIS — C250 Malignant neoplasm of head of pancreas: Secondary | ICD-10-CM | POA: Diagnosis not present

## 2022-06-03 DIAGNOSIS — Z7901 Long term (current) use of anticoagulants: Secondary | ICD-10-CM | POA: Diagnosis not present

## 2022-06-03 DIAGNOSIS — Z510 Encounter for antineoplastic radiation therapy: Secondary | ICD-10-CM | POA: Diagnosis not present

## 2022-06-03 LAB — RAD ONC ARIA SESSION SUMMARY
Course Elapsed Days: 25
Plan Fractions Treated to Date: 20
Plan Prescribed Dose Per Fraction: 1.8 Gy
Plan Total Fractions Prescribed: 25
Plan Total Prescribed Dose: 45 Gy
Reference Point Dosage Given to Date: 36 Gy
Reference Point Session Dosage Given: 1.8 Gy
Session Number: 20

## 2022-06-06 ENCOUNTER — Ambulatory Visit
Admission: RE | Admit: 2022-06-06 | Discharge: 2022-06-06 | Disposition: A | Discharge status: Home / Self Care | Payer: BC Managed Care – PPO | Source: Ambulatory Visit | Attending: Radiation Oncology | Admitting: Radiation Oncology

## 2022-06-06 ENCOUNTER — Other Ambulatory Visit: Payer: Self-pay

## 2022-06-06 DIAGNOSIS — R748 Abnormal levels of other serum enzymes: Secondary | ICD-10-CM | POA: Diagnosis not present

## 2022-06-06 DIAGNOSIS — Z7901 Long term (current) use of anticoagulants: Secondary | ICD-10-CM | POA: Diagnosis not present

## 2022-06-06 DIAGNOSIS — R634 Abnormal weight loss: Secondary | ICD-10-CM | POA: Diagnosis not present

## 2022-06-06 DIAGNOSIS — I4891 Unspecified atrial fibrillation: Secondary | ICD-10-CM | POA: Insufficient documentation

## 2022-06-06 DIAGNOSIS — Z86718 Personal history of other venous thrombosis and embolism: Secondary | ICD-10-CM | POA: Diagnosis not present

## 2022-06-06 DIAGNOSIS — F319 Bipolar disorder, unspecified: Secondary | ICD-10-CM | POA: Insufficient documentation

## 2022-06-06 DIAGNOSIS — R5381 Other malaise: Secondary | ICD-10-CM | POA: Insufficient documentation

## 2022-06-06 DIAGNOSIS — Z8719 Personal history of other diseases of the digestive system: Secondary | ICD-10-CM | POA: Insufficient documentation

## 2022-06-06 DIAGNOSIS — C250 Malignant neoplasm of head of pancreas: Secondary | ICD-10-CM | POA: Insufficient documentation

## 2022-06-06 DIAGNOSIS — D701 Agranulocytosis secondary to cancer chemotherapy: Secondary | ICD-10-CM | POA: Diagnosis not present

## 2022-06-06 DIAGNOSIS — T451X5A Adverse effect of antineoplastic and immunosuppressive drugs, initial encounter: Secondary | ICD-10-CM | POA: Insufficient documentation

## 2022-06-06 DIAGNOSIS — Z510 Encounter for antineoplastic radiation therapy: Secondary | ICD-10-CM | POA: Insufficient documentation

## 2022-06-06 LAB — RAD ONC ARIA SESSION SUMMARY
Course Elapsed Days: 28
Plan Fractions Treated to Date: 21
Plan Prescribed Dose Per Fraction: 1.8 Gy
Plan Total Fractions Prescribed: 25
Plan Total Prescribed Dose: 45 Gy
Reference Point Dosage Given to Date: 37.8 Gy
Reference Point Session Dosage Given: 1.8 Gy
Session Number: 21

## 2022-06-08 ENCOUNTER — Other Ambulatory Visit: Payer: Self-pay

## 2022-06-08 ENCOUNTER — Ambulatory Visit
Admission: RE | Admit: 2022-06-08 | Discharge: 2022-06-08 | Disposition: A | Discharge status: Home / Self Care | Payer: BC Managed Care – PPO | Source: Ambulatory Visit | Attending: Radiation Oncology | Admitting: Radiation Oncology

## 2022-06-08 DIAGNOSIS — Z86718 Personal history of other venous thrombosis and embolism: Secondary | ICD-10-CM | POA: Diagnosis not present

## 2022-06-08 DIAGNOSIS — T451X5A Adverse effect of antineoplastic and immunosuppressive drugs, initial encounter: Secondary | ICD-10-CM | POA: Diagnosis not present

## 2022-06-08 DIAGNOSIS — R634 Abnormal weight loss: Secondary | ICD-10-CM | POA: Diagnosis not present

## 2022-06-08 DIAGNOSIS — D701 Agranulocytosis secondary to cancer chemotherapy: Secondary | ICD-10-CM | POA: Diagnosis not present

## 2022-06-08 DIAGNOSIS — Z510 Encounter for antineoplastic radiation therapy: Secondary | ICD-10-CM | POA: Diagnosis not present

## 2022-06-08 DIAGNOSIS — I4891 Unspecified atrial fibrillation: Secondary | ICD-10-CM | POA: Diagnosis not present

## 2022-06-08 DIAGNOSIS — C250 Malignant neoplasm of head of pancreas: Secondary | ICD-10-CM | POA: Diagnosis not present

## 2022-06-08 DIAGNOSIS — Z7901 Long term (current) use of anticoagulants: Secondary | ICD-10-CM | POA: Diagnosis not present

## 2022-06-08 DIAGNOSIS — F319 Bipolar disorder, unspecified: Secondary | ICD-10-CM | POA: Diagnosis not present

## 2022-06-08 DIAGNOSIS — R5381 Other malaise: Secondary | ICD-10-CM | POA: Diagnosis not present

## 2022-06-08 DIAGNOSIS — R748 Abnormal levels of other serum enzymes: Secondary | ICD-10-CM | POA: Diagnosis not present

## 2022-06-08 DIAGNOSIS — Z8719 Personal history of other diseases of the digestive system: Secondary | ICD-10-CM | POA: Diagnosis not present

## 2022-06-08 LAB — RAD ONC ARIA SESSION SUMMARY
Course Elapsed Days: 30
Plan Fractions Treated to Date: 22
Plan Prescribed Dose Per Fraction: 1.8 Gy
Plan Total Fractions Prescribed: 25
Plan Total Prescribed Dose: 45 Gy
Reference Point Dosage Given to Date: 39.6 Gy
Reference Point Session Dosage Given: 1.8 Gy
Session Number: 22

## 2022-06-09 ENCOUNTER — Ambulatory Visit
Admission: RE | Admit: 2022-06-09 | Discharge: 2022-06-09 | Disposition: A | Discharge status: Home / Self Care | Payer: BC Managed Care – PPO | Source: Ambulatory Visit | Attending: Radiation Oncology | Admitting: Radiation Oncology

## 2022-06-09 ENCOUNTER — Other Ambulatory Visit: Payer: Self-pay

## 2022-06-09 DIAGNOSIS — Z86718 Personal history of other venous thrombosis and embolism: Secondary | ICD-10-CM | POA: Diagnosis not present

## 2022-06-09 DIAGNOSIS — D701 Agranulocytosis secondary to cancer chemotherapy: Secondary | ICD-10-CM | POA: Diagnosis not present

## 2022-06-09 DIAGNOSIS — Z510 Encounter for antineoplastic radiation therapy: Secondary | ICD-10-CM | POA: Diagnosis not present

## 2022-06-09 DIAGNOSIS — I4891 Unspecified atrial fibrillation: Secondary | ICD-10-CM | POA: Diagnosis not present

## 2022-06-09 DIAGNOSIS — F319 Bipolar disorder, unspecified: Secondary | ICD-10-CM | POA: Diagnosis not present

## 2022-06-09 DIAGNOSIS — R5381 Other malaise: Secondary | ICD-10-CM | POA: Diagnosis not present

## 2022-06-09 DIAGNOSIS — T451X5A Adverse effect of antineoplastic and immunosuppressive drugs, initial encounter: Secondary | ICD-10-CM | POA: Diagnosis not present

## 2022-06-09 DIAGNOSIS — Z8719 Personal history of other diseases of the digestive system: Secondary | ICD-10-CM | POA: Diagnosis not present

## 2022-06-09 DIAGNOSIS — C250 Malignant neoplasm of head of pancreas: Secondary | ICD-10-CM | POA: Diagnosis not present

## 2022-06-09 DIAGNOSIS — R634 Abnormal weight loss: Secondary | ICD-10-CM | POA: Diagnosis not present

## 2022-06-09 DIAGNOSIS — R748 Abnormal levels of other serum enzymes: Secondary | ICD-10-CM | POA: Diagnosis not present

## 2022-06-09 DIAGNOSIS — Z7901 Long term (current) use of anticoagulants: Secondary | ICD-10-CM | POA: Diagnosis not present

## 2022-06-09 LAB — RAD ONC ARIA SESSION SUMMARY
Course Elapsed Days: 31
Plan Fractions Treated to Date: 23
Plan Prescribed Dose Per Fraction: 1.8 Gy
Plan Total Fractions Prescribed: 25
Plan Total Prescribed Dose: 45 Gy
Reference Point Dosage Given to Date: 41.4 Gy
Reference Point Session Dosage Given: 1.8 Gy
Session Number: 23

## 2022-06-10 ENCOUNTER — Encounter: Payer: Self-pay | Admitting: General Practice

## 2022-06-10 ENCOUNTER — Other Ambulatory Visit: Payer: Self-pay

## 2022-06-10 ENCOUNTER — Telehealth: Payer: Self-pay

## 2022-06-10 ENCOUNTER — Ambulatory Visit
Admission: RE | Admit: 2022-06-10 | Discharge: 2022-06-10 | Disposition: A | Discharge status: Home / Self Care | Payer: BC Managed Care – PPO | Source: Ambulatory Visit | Attending: Radiation Oncology | Admitting: Radiation Oncology

## 2022-06-10 ENCOUNTER — Inpatient Hospital Stay: Payer: BC Managed Care – PPO | Attending: Oncology

## 2022-06-10 ENCOUNTER — Inpatient Hospital Stay (HOSPITAL_BASED_OUTPATIENT_CLINIC_OR_DEPARTMENT_OTHER): Payer: BC Managed Care – PPO | Admitting: Oncology

## 2022-06-10 ENCOUNTER — Inpatient Hospital Stay: Payer: BC Managed Care – PPO

## 2022-06-10 VITALS — BP 110/68 | HR 60 | Temp 98.1°F | Resp 18 | Ht 71.0 in | Wt 145.8 lb

## 2022-06-10 DIAGNOSIS — C250 Malignant neoplasm of head of pancreas: Secondary | ICD-10-CM

## 2022-06-10 DIAGNOSIS — Z86718 Personal history of other venous thrombosis and embolism: Secondary | ICD-10-CM | POA: Diagnosis not present

## 2022-06-10 DIAGNOSIS — Z8719 Personal history of other diseases of the digestive system: Secondary | ICD-10-CM | POA: Insufficient documentation

## 2022-06-10 DIAGNOSIS — Z7901 Long term (current) use of anticoagulants: Secondary | ICD-10-CM | POA: Diagnosis not present

## 2022-06-10 DIAGNOSIS — T451X5A Adverse effect of antineoplastic and immunosuppressive drugs, initial encounter: Secondary | ICD-10-CM | POA: Insufficient documentation

## 2022-06-10 DIAGNOSIS — D701 Agranulocytosis secondary to cancer chemotherapy: Secondary | ICD-10-CM | POA: Diagnosis not present

## 2022-06-10 DIAGNOSIS — F319 Bipolar disorder, unspecified: Secondary | ICD-10-CM | POA: Diagnosis not present

## 2022-06-10 DIAGNOSIS — Z510 Encounter for antineoplastic radiation therapy: Secondary | ICD-10-CM | POA: Diagnosis not present

## 2022-06-10 DIAGNOSIS — R748 Abnormal levels of other serum enzymes: Secondary | ICD-10-CM | POA: Insufficient documentation

## 2022-06-10 DIAGNOSIS — I82409 Acute embolism and thrombosis of unspecified deep veins of unspecified lower extremity: Secondary | ICD-10-CM

## 2022-06-10 DIAGNOSIS — R634 Abnormal weight loss: Secondary | ICD-10-CM | POA: Insufficient documentation

## 2022-06-10 DIAGNOSIS — R5381 Other malaise: Secondary | ICD-10-CM | POA: Diagnosis not present

## 2022-06-10 DIAGNOSIS — I4891 Unspecified atrial fibrillation: Secondary | ICD-10-CM | POA: Insufficient documentation

## 2022-06-10 LAB — CBC WITH DIFFERENTIAL (CANCER CENTER ONLY)
Abs Immature Granulocytes: 0.01 10*3/uL (ref 0.00–0.07)
Basophils Absolute: 0 10*3/uL (ref 0.0–0.1)
Basophils Relative: 0 %
Eosinophils Absolute: 0.1 10*3/uL (ref 0.0–0.5)
Eosinophils Relative: 4 %
HCT: 31 % — ABNORMAL LOW (ref 39.0–52.0)
Hemoglobin: 10.6 g/dL — ABNORMAL LOW (ref 13.0–17.0)
Immature Granulocytes: 1 %
Lymphocytes Relative: 6 %
Lymphs Abs: 0.1 10*3/uL — ABNORMAL LOW (ref 0.7–4.0)
MCH: 36.1 pg — ABNORMAL HIGH (ref 26.0–34.0)
MCHC: 34.2 g/dL (ref 30.0–36.0)
MCV: 105.4 fL — ABNORMAL HIGH (ref 80.0–100.0)
Monocytes Absolute: 0.3 10*3/uL (ref 0.1–1.0)
Monocytes Relative: 20 %
Neutro Abs: 1.2 10*3/uL — ABNORMAL LOW (ref 1.7–7.7)
Neutrophils Relative %: 69 %
Platelet Count: 58 10*3/uL — ABNORMAL LOW (ref 150–400)
RBC: 2.94 MIL/uL — ABNORMAL LOW (ref 4.22–5.81)
RDW: 14.3 % (ref 11.5–15.5)
WBC Count: 1.7 10*3/uL — ABNORMAL LOW (ref 4.0–10.5)
nRBC: 0 % (ref 0.0–0.2)

## 2022-06-10 LAB — CMP (CANCER CENTER ONLY)
ALT: 35 U/L (ref 0–44)
AST: 38 U/L (ref 15–41)
Albumin: 3.8 g/dL (ref 3.5–5.0)
Alkaline Phosphatase: 212 U/L — ABNORMAL HIGH (ref 38–126)
Anion gap: 7 (ref 5–15)
BUN: 18 mg/dL (ref 6–20)
CO2: 25 mmol/L (ref 22–32)
Calcium: 9.3 mg/dL (ref 8.9–10.3)
Chloride: 107 mmol/L (ref 98–111)
Creatinine: 0.78 mg/dL (ref 0.61–1.24)
GFR, Estimated: >60 mL/min (ref >60)
Glucose, Bld: 98 mg/dL (ref 70–99)
Potassium: 3.8 mmol/L (ref 3.5–5.1)
Sodium: 139 mmol/L (ref 135–145)
Total Bilirubin: 0.6 mg/dL (ref 0.3–1.2)
Total Protein: 6.2 g/dL — ABNORMAL LOW (ref 6.5–8.1)

## 2022-06-10 LAB — RAD ONC ARIA SESSION SUMMARY
Course Elapsed Days: 32
Plan Fractions Treated to Date: 24
Plan Prescribed Dose Per Fraction: 1.8 Gy
Plan Total Fractions Prescribed: 25
Plan Total Prescribed Dose: 45 Gy
Reference Point Dosage Given to Date: 43.2 Gy
Reference Point Session Dosage Given: 1.8 Gy
Session Number: 24

## 2022-06-10 MED ORDER — HEPARIN SOD (PORK) LOCK FLUSH 100 UNIT/ML IV SOLN
500.0000 [IU] | Freq: Once | INTRAVENOUS | Status: AC
  Administered 2022-06-10: 500 [IU] via INTRAVENOUS

## 2022-06-10 MED ORDER — SODIUM CHLORIDE 0.9% FLUSH
10.0000 mL | INTRAVENOUS | Status: DC | PRN
  Administered 2022-06-10: 10 mL via INTRAVENOUS

## 2022-06-10 NOTE — Progress Notes (Signed)
[place] Spiritual Care Note  Met with Dean [name] and his wife Dean [name] in my office to review Advance Directives paperwork. They did not make decisions today, but worked to ensure that they understand the forms in order to have further discussion with family and providers as desired. They plan to complete AD with their credit union as soon as they are ready.   [place], [place], [facility] Pager [phone] Voicemail [phone]

## 2022-06-10 NOTE — Telephone Encounter (Signed)
Call placed to pt with following instructions per MD Sherrill: Due to low platelets and ANC, Take '1000mg'$  of Xeloda on days of radiation only. Resume '1500mg'$  dose on all other days. Pt verbalizes understanding. Pt verbalizes understanding of upcoming appointments. Pt will call [facility] with questions/concerns

## 2022-06-10 NOTE — Patient Instructions (Signed)

## 2022-06-10 NOTE — Progress Notes (Signed)
  [place] OFFICE PROGRESS NOTE   Diagnosis: Pancreas cancer  INTERVAL HISTORY:   [name] returns as scheduled.  Continue Xeloda and radiation.  No mouth sores, nausea, diarrhea, or hand/foot pain.  He reports malaise.  He is losing weight, but reports adequate calorie intake.  No peripheral numbness.  Objective:  Vital signs in last 24 hours:  Blood pressure 98/62, pulse 60, temperature 98.1 F (36.7 C), temperature source Oral, resp. rate 18, height '5\' 11"'$  (1.803 m), weight 145 lb 12.8 oz (66.1 kg), SpO2 100 %.    HEENT: No thrush or ulcers Resp: Lungs clear bilaterally Cardio: Regular rate and rhythm GI: No hepatosplenomegaly, nontender Vascular: No leg edema  Skin: Mild hyperpigmentation of the palms and soles, no erythema  Portacath/PICC-without erythema  Lab Results:  Lab Results  Component Value Date   WBC 2.1 (L) 05/30/2022   HGB 10.9 (L) 05/30/2022   HCT 31.9 (L) 05/30/2022   MCV 107.0 (H) 05/30/2022   PLT 77 (L) 05/30/2022   NEUTROABS 1.5 (L) 05/30/2022    CMP  Lab Results  Component Value Date   NA 137 05/30/2022   K 3.5 05/30/2022   CL 107 05/30/2022   CO2 26 05/30/2022   GLUCOSE 152 (H) 05/30/2022   BUN 16 05/30/2022   CREATININE 0.86 05/30/2022   CALCIUM 9.2 05/30/2022   PROT 6.1 (L) 05/30/2022   ALBUMIN 3.6 05/30/2022   AST 55 (H) 05/30/2022   ALT 48 (H) 05/30/2022   ALKPHOS 239 (H) 05/30/2022   BILITOT 0.5 05/30/2022   GFRNONAA >60 05/30/2022   GFRAA >60 08/19/2015    Lab Results  Component Value Date   MLY650 85 (H) 10/07/2021     Medications: I have reviewed the patient's current medications.   Assessment/Plan: Pancreas cancer, status post a pancreaticoduodenectomy 11/12/2021, stage IIb (pT1,pN1) 1/11 lymph nodes, perineural invasion positive, positive margin at the SMA and SMV MRCP 09/27/2021-diffuse intrahepatic and common bile duct dilatation with abrupt termination at the level of the head of the  pancreas, indistinct area of hypoenhancement in the pancreas head ERCP with placement of pancreatic duct and bile duct stents 10/04/2021 EUS 1 10/04/2021-no pancreas mass identified, FNA of pancreas head-"atypical "cells, no adenopathy CT abdomen/pelvis 10/18/2021-no focal liver abnormality, intra and extrahepatic biliary ductal dilatation with narrowing of the common duct and the pancreas head, no enlarged abdominal lymph nodes Cycle 1 FOLFIRINOX 12/27/2021, irinotecan held with cycle 1 Cycle 2 FOLFIRINOX 01/24/2022, oxaliplatin and irinotecan dose reduced secondary to elevated liver enzymes, Udenyca Cycle 3 FOLFIRINOX 02/07/2022, same doses as cycle 2 Cycle 4 FOLFIRINOX 02/21/2022 Cycle 5 FOLFIRINOX 03/07/2022 Cycle 6 FOLFIRINOX 03/22/2022 Cycle 7 FOLFIRINOX 04/05/2022 Cycle 8 FOLFIRINOX 04/19/2022 Radiation/Xeloda 05/09/2022-06/16/2022 Acute pancreatitis 10/04/2021 Bipolar disorder Left soleal DVT 11/26/2021-apixaban Port-A-Cath placement 12/22/2021  Atrial fibrillation 12/22/2021, converted to sinus rhythm, discharged home 12/23/2021 on metoprolol 12.5 mg twice daily, on Eliquis for prior DVT. Neutropenia secondary to chemotherapy 01/10/2022, Udenyca added with cycle 2 Elevated liver enzymes, predating chemotherapy    Disposition: [name] will complete adjuvant Xeloda and radiation on 06/16/2022.  He appears to be tolerating the Xeloda well.  Encouraged him to increase his calorie intake.  He will return for an office visit and CA 19-9 in 4-5 weeks.  He will then schedule follow-up with Dr. Barry Dienes for Port-A-Cath removal.  Betsy Coder, MD  06/10/2022  8:37 AM

## 2022-06-13 ENCOUNTER — Other Ambulatory Visit: Payer: Self-pay

## 2022-06-13 ENCOUNTER — Ambulatory Visit
Admission: RE | Admit: 2022-06-13 | Discharge: 2022-06-13 | Disposition: A | Discharge status: Home / Self Care | Payer: BC Managed Care – PPO | Source: Ambulatory Visit | Attending: Radiation Oncology | Admitting: Radiation Oncology

## 2022-06-13 DIAGNOSIS — F319 Bipolar disorder, unspecified: Secondary | ICD-10-CM | POA: Diagnosis not present

## 2022-06-13 DIAGNOSIS — Z7901 Long term (current) use of anticoagulants: Secondary | ICD-10-CM | POA: Diagnosis not present

## 2022-06-13 DIAGNOSIS — R634 Abnormal weight loss: Secondary | ICD-10-CM | POA: Diagnosis not present

## 2022-06-13 DIAGNOSIS — I4891 Unspecified atrial fibrillation: Secondary | ICD-10-CM | POA: Diagnosis not present

## 2022-06-13 DIAGNOSIS — Z86718 Personal history of other venous thrombosis and embolism: Secondary | ICD-10-CM | POA: Diagnosis not present

## 2022-06-13 DIAGNOSIS — C250 Malignant neoplasm of head of pancreas: Secondary | ICD-10-CM | POA: Diagnosis not present

## 2022-06-13 DIAGNOSIS — T451X5A Adverse effect of antineoplastic and immunosuppressive drugs, initial encounter: Secondary | ICD-10-CM | POA: Diagnosis not present

## 2022-06-13 DIAGNOSIS — R5381 Other malaise: Secondary | ICD-10-CM | POA: Diagnosis not present

## 2022-06-13 DIAGNOSIS — R748 Abnormal levels of other serum enzymes: Secondary | ICD-10-CM | POA: Diagnosis not present

## 2022-06-13 DIAGNOSIS — D701 Agranulocytosis secondary to cancer chemotherapy: Secondary | ICD-10-CM | POA: Diagnosis not present

## 2022-06-13 DIAGNOSIS — Z8719 Personal history of other diseases of the digestive system: Secondary | ICD-10-CM | POA: Diagnosis not present

## 2022-06-13 DIAGNOSIS — Z510 Encounter for antineoplastic radiation therapy: Secondary | ICD-10-CM | POA: Diagnosis not present

## 2022-06-13 LAB — RAD ONC ARIA SESSION SUMMARY
Course Elapsed Days: 35
Plan Fractions Treated to Date: 25
Plan Prescribed Dose Per Fraction: 1.8 Gy
Plan Total Fractions Prescribed: 25
Plan Total Prescribed Dose: 45 Gy
Reference Point Dosage Given to Date: 45 Gy
Reference Point Session Dosage Given: 1.8 Gy
Session Number: 25

## 2022-06-14 ENCOUNTER — Ambulatory Visit
Admission: RE | Admit: 2022-06-14 | Discharge: 2022-06-14 | Disposition: A | Discharge status: Home / Self Care | Payer: BC Managed Care – PPO | Source: Ambulatory Visit | Attending: Radiation Oncology | Admitting: Radiation Oncology

## 2022-06-14 ENCOUNTER — Inpatient Hospital Stay: Payer: BC Managed Care – PPO

## 2022-06-14 ENCOUNTER — Telehealth: Payer: Self-pay

## 2022-06-14 ENCOUNTER — Other Ambulatory Visit: Payer: Self-pay

## 2022-06-14 DIAGNOSIS — C250 Malignant neoplasm of head of pancreas: Secondary | ICD-10-CM

## 2022-06-14 DIAGNOSIS — R5381 Other malaise: Secondary | ICD-10-CM | POA: Diagnosis not present

## 2022-06-14 DIAGNOSIS — F319 Bipolar disorder, unspecified: Secondary | ICD-10-CM | POA: Diagnosis not present

## 2022-06-14 DIAGNOSIS — T451X5A Adverse effect of antineoplastic and immunosuppressive drugs, initial encounter: Secondary | ICD-10-CM | POA: Diagnosis not present

## 2022-06-14 DIAGNOSIS — Z7901 Long term (current) use of anticoagulants: Secondary | ICD-10-CM | POA: Diagnosis not present

## 2022-06-14 DIAGNOSIS — Z510 Encounter for antineoplastic radiation therapy: Secondary | ICD-10-CM | POA: Diagnosis not present

## 2022-06-14 DIAGNOSIS — Z8719 Personal history of other diseases of the digestive system: Secondary | ICD-10-CM | POA: Diagnosis not present

## 2022-06-14 DIAGNOSIS — D701 Agranulocytosis secondary to cancer chemotherapy: Secondary | ICD-10-CM | POA: Diagnosis not present

## 2022-06-14 DIAGNOSIS — R748 Abnormal levels of other serum enzymes: Secondary | ICD-10-CM | POA: Diagnosis not present

## 2022-06-14 DIAGNOSIS — Z86718 Personal history of other venous thrombosis and embolism: Secondary | ICD-10-CM | POA: Diagnosis not present

## 2022-06-14 DIAGNOSIS — I4891 Unspecified atrial fibrillation: Secondary | ICD-10-CM | POA: Diagnosis not present

## 2022-06-14 DIAGNOSIS — R634 Abnormal weight loss: Secondary | ICD-10-CM | POA: Diagnosis not present

## 2022-06-14 LAB — RAD ONC ARIA SESSION SUMMARY
Course Elapsed Days: 36
Plan Fractions Treated to Date: 1
Plan Prescribed Dose Per Fraction: 1.8 Gy
Plan Total Fractions Prescribed: 3
Plan Total Prescribed Dose: 5.4 Gy
Reference Point Dosage Given to Date: 46.8 Gy
Reference Point Session Dosage Given: 1.8 Gy
Session Number: 26

## 2022-06-14 LAB — CBC WITH DIFFERENTIAL (CANCER CENTER ONLY)
Abs Immature Granulocytes: 0 10*3/uL (ref 0.00–0.07)
Basophils Absolute: 0 10*3/uL (ref 0.0–0.1)
Basophils Relative: 1 %
Eosinophils Absolute: 0.1 10*3/uL (ref 0.0–0.5)
Eosinophils Relative: 3 %
HCT: 28.8 % — ABNORMAL LOW (ref 39.0–52.0)
Hemoglobin: 10.2 g/dL — ABNORMAL LOW (ref 13.0–17.0)
Immature Granulocytes: 0 %
Lymphocytes Relative: 6 %
Lymphs Abs: 0.1 10*3/uL — ABNORMAL LOW (ref 0.7–4.0)
MCH: 37.1 pg — ABNORMAL HIGH (ref 26.0–34.0)
MCHC: 35.4 g/dL (ref 30.0–36.0)
MCV: 104.7 fL — ABNORMAL HIGH (ref 80.0–100.0)
Monocytes Absolute: 0.4 10*3/uL (ref 0.1–1.0)
Monocytes Relative: 28 %
Neutro Abs: 1 10*3/uL — ABNORMAL LOW (ref 1.7–7.7)
Neutrophils Relative %: 62 %
Platelet Count: 56 10*3/uL — ABNORMAL LOW (ref 150–400)
RBC: 2.75 MIL/uL — ABNORMAL LOW (ref 4.22–5.81)
RDW: 14.2 % (ref 11.5–15.5)
WBC Count: 1.6 10*3/uL — ABNORMAL LOW (ref 4.0–10.5)
nRBC: 0 % (ref 0.0–0.2)

## 2022-06-14 NOTE — Telephone Encounter (Signed)
Pt verbalized understanding. Discussed  neutropenic precautions and the need to call if he had fever of bleeding. Pt scheduled for lab on 7/14

## 2022-06-14 NOTE — Telephone Encounter (Signed)
-----   Message from Ladell Pier, MD sent at 06/14/2022  4:37 PM EDT ----- Please call patient, white count and platelets remain low and are stable, call for a fever or bleeding, repeat CBC 06/17/2022 or 06/20/2022

## 2022-06-15 ENCOUNTER — Ambulatory Visit
Admission: RE | Admit: 2022-06-15 | Discharge: 2022-06-15 | Disposition: A | Discharge status: Home / Self Care | Payer: BC Managed Care – PPO | Source: Ambulatory Visit | Attending: Radiation Oncology | Admitting: Radiation Oncology

## 2022-06-15 ENCOUNTER — Other Ambulatory Visit: Payer: Self-pay

## 2022-06-15 DIAGNOSIS — Z7901 Long term (current) use of anticoagulants: Secondary | ICD-10-CM | POA: Diagnosis not present

## 2022-06-15 DIAGNOSIS — D701 Agranulocytosis secondary to cancer chemotherapy: Secondary | ICD-10-CM | POA: Diagnosis not present

## 2022-06-15 DIAGNOSIS — I4891 Unspecified atrial fibrillation: Secondary | ICD-10-CM | POA: Diagnosis not present

## 2022-06-15 DIAGNOSIS — R5381 Other malaise: Secondary | ICD-10-CM | POA: Diagnosis not present

## 2022-06-15 DIAGNOSIS — F319 Bipolar disorder, unspecified: Secondary | ICD-10-CM | POA: Diagnosis not present

## 2022-06-15 DIAGNOSIS — R748 Abnormal levels of other serum enzymes: Secondary | ICD-10-CM | POA: Diagnosis not present

## 2022-06-15 DIAGNOSIS — Z510 Encounter for antineoplastic radiation therapy: Secondary | ICD-10-CM | POA: Diagnosis not present

## 2022-06-15 DIAGNOSIS — R634 Abnormal weight loss: Secondary | ICD-10-CM | POA: Diagnosis not present

## 2022-06-15 DIAGNOSIS — Z86718 Personal history of other venous thrombosis and embolism: Secondary | ICD-10-CM | POA: Diagnosis not present

## 2022-06-15 DIAGNOSIS — T451X5A Adverse effect of antineoplastic and immunosuppressive drugs, initial encounter: Secondary | ICD-10-CM | POA: Diagnosis not present

## 2022-06-15 DIAGNOSIS — Z8719 Personal history of other diseases of the digestive system: Secondary | ICD-10-CM | POA: Diagnosis not present

## 2022-06-15 DIAGNOSIS — C250 Malignant neoplasm of head of pancreas: Secondary | ICD-10-CM | POA: Diagnosis not present

## 2022-06-15 LAB — RAD ONC ARIA SESSION SUMMARY
Course Elapsed Days: 37
Plan Fractions Treated to Date: 2
Plan Prescribed Dose Per Fraction: 1.8 Gy
Plan Total Fractions Prescribed: 3
Plan Total Prescribed Dose: 5.4 Gy
Reference Point Dosage Given to Date: 48.6 Gy
Reference Point Session Dosage Given: 1.8 Gy
Session Number: 27

## 2022-06-16 ENCOUNTER — Ambulatory Visit
Admission: RE | Admit: 2022-06-16 | Discharge: 2022-06-16 | Disposition: A | Discharge status: Home / Self Care | Payer: BC Managed Care – PPO | Source: Ambulatory Visit | Attending: Radiation Oncology | Admitting: Radiation Oncology

## 2022-06-16 ENCOUNTER — Encounter: Payer: Self-pay | Admitting: Radiation Oncology

## 2022-06-16 ENCOUNTER — Other Ambulatory Visit: Payer: Self-pay

## 2022-06-16 ENCOUNTER — Ambulatory Visit: Payer: BC Managed Care – PPO

## 2022-06-16 ENCOUNTER — Other Ambulatory Visit: Payer: Self-pay | Admitting: Radiation Oncology

## 2022-06-16 DIAGNOSIS — C250 Malignant neoplasm of head of pancreas: Secondary | ICD-10-CM | POA: Diagnosis not present

## 2022-06-16 DIAGNOSIS — I4891 Unspecified atrial fibrillation: Secondary | ICD-10-CM | POA: Diagnosis not present

## 2022-06-16 DIAGNOSIS — R748 Abnormal levels of other serum enzymes: Secondary | ICD-10-CM | POA: Diagnosis not present

## 2022-06-16 DIAGNOSIS — Z86718 Personal history of other venous thrombosis and embolism: Secondary | ICD-10-CM | POA: Diagnosis not present

## 2022-06-16 DIAGNOSIS — Z8719 Personal history of other diseases of the digestive system: Secondary | ICD-10-CM | POA: Diagnosis not present

## 2022-06-16 DIAGNOSIS — D701 Agranulocytosis secondary to cancer chemotherapy: Secondary | ICD-10-CM | POA: Diagnosis not present

## 2022-06-16 DIAGNOSIS — R5381 Other malaise: Secondary | ICD-10-CM | POA: Diagnosis not present

## 2022-06-16 DIAGNOSIS — R634 Abnormal weight loss: Secondary | ICD-10-CM | POA: Diagnosis not present

## 2022-06-16 DIAGNOSIS — F319 Bipolar disorder, unspecified: Secondary | ICD-10-CM | POA: Diagnosis not present

## 2022-06-16 DIAGNOSIS — T451X5A Adverse effect of antineoplastic and immunosuppressive drugs, initial encounter: Secondary | ICD-10-CM | POA: Diagnosis not present

## 2022-06-16 DIAGNOSIS — Z7901 Long term (current) use of anticoagulants: Secondary | ICD-10-CM | POA: Diagnosis not present

## 2022-06-16 DIAGNOSIS — Z510 Encounter for antineoplastic radiation therapy: Secondary | ICD-10-CM | POA: Diagnosis not present

## 2022-06-16 LAB — RAD ONC ARIA SESSION SUMMARY
Course Elapsed Days: 38
Plan Fractions Treated to Date: 3
Plan Prescribed Dose Per Fraction: 1.8 Gy
Plan Total Fractions Prescribed: 3
Plan Total Prescribed Dose: 5.4 Gy
Reference Point Dosage Given to Date: 50.4 Gy
Reference Point Session Dosage Given: 1.8 Gy
Session Number: 28

## 2022-06-16 MED ORDER — ONDANSETRON HCL 8 MG PO TABS: ORAL_TABLET | ORAL | 1 refills | Status: AC

## 2022-06-17 ENCOUNTER — Inpatient Hospital Stay: Payer: BC Managed Care – PPO

## 2022-06-17 ENCOUNTER — Telehealth: Payer: Self-pay

## 2022-06-17 DIAGNOSIS — I4891 Unspecified atrial fibrillation: Secondary | ICD-10-CM | POA: Diagnosis not present

## 2022-06-17 DIAGNOSIS — D701 Agranulocytosis secondary to cancer chemotherapy: Secondary | ICD-10-CM | POA: Diagnosis not present

## 2022-06-17 DIAGNOSIS — C250 Malignant neoplasm of head of pancreas: Secondary | ICD-10-CM | POA: Diagnosis not present

## 2022-06-17 DIAGNOSIS — F319 Bipolar disorder, unspecified: Secondary | ICD-10-CM | POA: Diagnosis not present

## 2022-06-17 DIAGNOSIS — R5381 Other malaise: Secondary | ICD-10-CM | POA: Diagnosis not present

## 2022-06-17 DIAGNOSIS — Z7901 Long term (current) use of anticoagulants: Secondary | ICD-10-CM | POA: Diagnosis not present

## 2022-06-17 DIAGNOSIS — Z8719 Personal history of other diseases of the digestive system: Secondary | ICD-10-CM | POA: Diagnosis not present

## 2022-06-17 DIAGNOSIS — R634 Abnormal weight loss: Secondary | ICD-10-CM | POA: Diagnosis not present

## 2022-06-17 DIAGNOSIS — R748 Abnormal levels of other serum enzymes: Secondary | ICD-10-CM | POA: Diagnosis not present

## 2022-06-17 DIAGNOSIS — T451X5A Adverse effect of antineoplastic and immunosuppressive drugs, initial encounter: Secondary | ICD-10-CM | POA: Diagnosis not present

## 2022-06-17 DIAGNOSIS — Z86718 Personal history of other venous thrombosis and embolism: Secondary | ICD-10-CM | POA: Diagnosis not present

## 2022-06-17 DIAGNOSIS — Z510 Encounter for antineoplastic radiation therapy: Secondary | ICD-10-CM | POA: Diagnosis not present

## 2022-06-17 LAB — CBC WITH DIFFERENTIAL (CANCER CENTER ONLY)
Abs Immature Granulocytes: 0 10*3/uL (ref 0.00–0.07)
Basophils Absolute: 0 10*3/uL (ref 0.0–0.1)
Basophils Relative: 1 %
Eosinophils Absolute: 0.1 10*3/uL (ref 0.0–0.5)
Eosinophils Relative: 3 %
HCT: 29.7 % — ABNORMAL LOW (ref 39.0–52.0)
Hemoglobin: 10.3 g/dL — ABNORMAL LOW (ref 13.0–17.0)
Immature Granulocytes: 0 %
Lymphocytes Relative: 6 %
Lymphs Abs: 0.1 10*3/uL — ABNORMAL LOW (ref 0.7–4.0)
MCH: 37.2 pg — ABNORMAL HIGH (ref 26.0–34.0)
MCHC: 34.7 g/dL (ref 30.0–36.0)
MCV: 107.2 fL — ABNORMAL HIGH (ref 80.0–100.0)
Monocytes Absolute: 0.3 10*3/uL (ref 0.1–1.0)
Monocytes Relative: 17 %
Neutro Abs: 1.3 10*3/uL — ABNORMAL LOW (ref 1.7–7.7)
Neutrophils Relative %: 73 %
Platelet Count: 51 10*3/uL — ABNORMAL LOW (ref 150–400)
RBC: 2.77 MIL/uL — ABNORMAL LOW (ref 4.22–5.81)
RDW: 14.6 % (ref 11.5–15.5)
WBC Count: 1.7 10*3/uL — ABNORMAL LOW (ref 4.0–10.5)
nRBC: 0 % (ref 0.0–0.2)

## 2022-06-17 NOTE — Telephone Encounter (Signed)
-----   Message from Ladell Pier, MD sent at 06/17/2022  2:46 PM EDT ----- Please call patient, white count and platelets are stable, call for a fever or bleeding, repeat CBC in 1 week

## 2022-06-17 NOTE — Telephone Encounter (Signed)
Pt verbalized understanding.

## 2022-06-20 NOTE — Progress Notes (Signed)
                                                                                                                                                             Patient Name: Dean [name] MRN: [id number] DOB: Jul 15, 1966 Referring Physician: Betsy Coder (Profile Not Attached) Date of Service: 06/16/2022 [facility] [place]                                                        End Of Treatment Note  Diagnoses: C25.0-Malignant neoplasm of head of pancreas  Cancer Staging:     Stage IIB, pT1cN1M0, adenocarcinoma of the head of pancreas  Intent: Curative  Radiation Treatment Dates: 05/09/2022 through 06/16/2022 Site Technique Total Dose (Gy) Dose per Fx (Gy) Completed Fx Beam Energies  Pancreas: Pancreas IMRT 45/45 1.8 25/25 6X  Pancreas: Pancreas_Bst IMRT 5.4/5.4 1.8 3/3 6X   Narrative: The patient tolerated radiation therapy relatively well. He developed fatigue and nausea during therapy. He was using Zofran to manage his symptoms.   Plan: The patient will receive a call in about one month from the radiation oncology department. He will continue follow up with Dr. Benay Spice as well.  ________________________________________________    Carola Rhine, [facility]

## 2022-06-21 DIAGNOSIS — Z13220 Encounter for screening for lipoid disorders: Secondary | ICD-10-CM | POA: Diagnosis not present

## 2022-06-21 DIAGNOSIS — Z86718 Personal history of other venous thrombosis and embolism: Secondary | ICD-10-CM | POA: Diagnosis not present

## 2022-06-21 DIAGNOSIS — E039 Hypothyroidism, unspecified: Secondary | ICD-10-CM | POA: Diagnosis not present

## 2022-06-21 DIAGNOSIS — F319 Bipolar disorder, unspecified: Secondary | ICD-10-CM | POA: Diagnosis not present

## 2022-06-21 DIAGNOSIS — Z125 Encounter for screening for malignant neoplasm of prostate: Secondary | ICD-10-CM | POA: Diagnosis not present

## 2022-06-21 DIAGNOSIS — Z0000 Encounter for general adult medical examination without abnormal findings: Secondary | ICD-10-CM | POA: Diagnosis not present

## 2022-06-21 DIAGNOSIS — I4891 Unspecified atrial fibrillation: Secondary | ICD-10-CM | POA: Diagnosis not present

## 2022-06-22 ENCOUNTER — Other Ambulatory Visit: Payer: Self-pay | Admitting: *Deleted

## 2022-06-22 ENCOUNTER — Other Ambulatory Visit: Payer: Self-pay

## 2022-06-22 ENCOUNTER — Inpatient Hospital Stay: Payer: BC Managed Care – PPO

## 2022-06-22 DIAGNOSIS — C250 Malignant neoplasm of head of pancreas: Secondary | ICD-10-CM | POA: Diagnosis not present

## 2022-06-22 DIAGNOSIS — Z8719 Personal history of other diseases of the digestive system: Secondary | ICD-10-CM | POA: Diagnosis not present

## 2022-06-22 DIAGNOSIS — Z510 Encounter for antineoplastic radiation therapy: Secondary | ICD-10-CM | POA: Diagnosis not present

## 2022-06-22 DIAGNOSIS — T451X5A Adverse effect of antineoplastic and immunosuppressive drugs, initial encounter: Secondary | ICD-10-CM | POA: Diagnosis not present

## 2022-06-22 DIAGNOSIS — R748 Abnormal levels of other serum enzymes: Secondary | ICD-10-CM | POA: Diagnosis not present

## 2022-06-22 DIAGNOSIS — R5381 Other malaise: Secondary | ICD-10-CM | POA: Diagnosis not present

## 2022-06-22 DIAGNOSIS — Z7901 Long term (current) use of anticoagulants: Secondary | ICD-10-CM | POA: Diagnosis not present

## 2022-06-22 DIAGNOSIS — R634 Abnormal weight loss: Secondary | ICD-10-CM | POA: Diagnosis not present

## 2022-06-22 DIAGNOSIS — F319 Bipolar disorder, unspecified: Secondary | ICD-10-CM | POA: Diagnosis not present

## 2022-06-22 DIAGNOSIS — D701 Agranulocytosis secondary to cancer chemotherapy: Secondary | ICD-10-CM | POA: Diagnosis not present

## 2022-06-22 DIAGNOSIS — I4891 Unspecified atrial fibrillation: Secondary | ICD-10-CM | POA: Diagnosis not present

## 2022-06-22 DIAGNOSIS — Z86718 Personal history of other venous thrombosis and embolism: Secondary | ICD-10-CM | POA: Diagnosis not present

## 2022-06-22 LAB — CBC WITH DIFFERENTIAL (CANCER CENTER ONLY)
Abs Immature Granulocytes: 0 10*3/uL (ref 0.00–0.07)
Band Neutrophils: 2 %
Basophils Absolute: 0 10*3/uL (ref 0.0–0.1)
Basophils Relative: 0 %
Eosinophils Absolute: 0 10*3/uL (ref 0.0–0.5)
Eosinophils Relative: 0 %
HCT: 32 % — ABNORMAL LOW (ref 39.0–52.0)
Hemoglobin: 11 g/dL — ABNORMAL LOW (ref 13.0–17.0)
Lymphocytes Relative: 48 %
Lymphs Abs: 1.3 10*3/uL (ref 0.7–4.0)
MCH: 36.8 pg — ABNORMAL HIGH (ref 26.0–34.0)
MCHC: 34.4 g/dL (ref 30.0–36.0)
MCV: 107 fL — ABNORMAL HIGH (ref 80.0–100.0)
Monocytes Absolute: 0.3 10*3/uL (ref 0.1–1.0)
Monocytes Relative: 12 %
Neutro Abs: 1.1 10*3/uL — ABNORMAL LOW (ref 1.7–7.7)
Neutrophils Relative %: 38 %
Platelet Count: 49 10*3/uL — ABNORMAL LOW (ref 150–400)
RBC: 2.99 MIL/uL — ABNORMAL LOW (ref 4.22–5.81)
RDW: 14.9 % (ref 11.5–15.5)
WBC Count: 2.7 10*3/uL — ABNORMAL LOW (ref 4.0–10.5)
nRBC: 0 % (ref 0.0–0.2)

## 2022-06-29 ENCOUNTER — Inpatient Hospital Stay: Payer: BC Managed Care – PPO

## 2022-06-29 ENCOUNTER — Telehealth: Payer: Self-pay

## 2022-06-29 DIAGNOSIS — C250 Malignant neoplasm of head of pancreas: Secondary | ICD-10-CM

## 2022-06-29 DIAGNOSIS — T451X5A Adverse effect of antineoplastic and immunosuppressive drugs, initial encounter: Secondary | ICD-10-CM | POA: Diagnosis not present

## 2022-06-29 DIAGNOSIS — R748 Abnormal levels of other serum enzymes: Secondary | ICD-10-CM | POA: Diagnosis not present

## 2022-06-29 DIAGNOSIS — I4891 Unspecified atrial fibrillation: Secondary | ICD-10-CM | POA: Diagnosis not present

## 2022-06-29 DIAGNOSIS — Z86718 Personal history of other venous thrombosis and embolism: Secondary | ICD-10-CM | POA: Diagnosis not present

## 2022-06-29 DIAGNOSIS — Z7901 Long term (current) use of anticoagulants: Secondary | ICD-10-CM | POA: Diagnosis not present

## 2022-06-29 DIAGNOSIS — R5381 Other malaise: Secondary | ICD-10-CM | POA: Diagnosis not present

## 2022-06-29 DIAGNOSIS — Z8719 Personal history of other diseases of the digestive system: Secondary | ICD-10-CM | POA: Diagnosis not present

## 2022-06-29 DIAGNOSIS — D701 Agranulocytosis secondary to cancer chemotherapy: Secondary | ICD-10-CM | POA: Diagnosis not present

## 2022-06-29 DIAGNOSIS — F319 Bipolar disorder, unspecified: Secondary | ICD-10-CM | POA: Diagnosis not present

## 2022-06-29 DIAGNOSIS — R634 Abnormal weight loss: Secondary | ICD-10-CM | POA: Diagnosis not present

## 2022-06-29 DIAGNOSIS — Z510 Encounter for antineoplastic radiation therapy: Secondary | ICD-10-CM | POA: Diagnosis not present

## 2022-06-29 LAB — CBC WITH DIFFERENTIAL (CANCER CENTER ONLY)
Abs Immature Granulocytes: 0 10*3/uL (ref 0.00–0.07)
Basophils Absolute: 0 10*3/uL (ref 0.0–0.1)
Basophils Relative: 0 %
Eosinophils Absolute: 0 10*3/uL (ref 0.0–0.5)
Eosinophils Relative: 2 %
HCT: 28.1 % — ABNORMAL LOW (ref 39.0–52.0)
Hemoglobin: 9.7 g/dL — ABNORMAL LOW (ref 13.0–17.0)
Immature Granulocytes: 0 %
Lymphocytes Relative: 34 %
Lymphs Abs: 0.9 10*3/uL (ref 0.7–4.0)
MCH: 36.6 pg — ABNORMAL HIGH (ref 26.0–34.0)
MCHC: 34.5 g/dL (ref 30.0–36.0)
MCV: 106 fL — ABNORMAL HIGH (ref 80.0–100.0)
Monocytes Absolute: 0.5 10*3/uL (ref 0.1–1.0)
Monocytes Relative: 21 %
Neutro Abs: 1.1 10*3/uL — ABNORMAL LOW (ref 1.7–7.7)
Neutrophils Relative %: 43 %
Platelet Count: 61 10*3/uL — ABNORMAL LOW (ref 150–400)
RBC: 2.65 MIL/uL — ABNORMAL LOW (ref 4.22–5.81)
RDW: 14.5 % (ref 11.5–15.5)
WBC Count: 2.5 10*3/uL — ABNORMAL LOW (ref 4.0–10.5)
nRBC: 0 % (ref 0.0–0.2)

## 2022-06-29 NOTE — Telephone Encounter (Signed)
-----   Message from Ladell Pier, MD sent at 06/29/2022  3:03 PM EDT ----- Please call patient, platelets and white count remain low and are stable, call for a fever or bleeding, follow-up as scheduled

## 2022-06-29 NOTE — Telephone Encounter (Signed)
Called the patient to let him know his platelets and white count remain low and are stable, call for a fever or bleeding. Follow-up as scheduled

## 2022-07-08 ENCOUNTER — Encounter: Payer: Self-pay | Admitting: Cardiovascular Disease

## 2022-07-08 ENCOUNTER — Ambulatory Visit (INDEPENDENT_AMBULATORY_CARE_PROVIDER_SITE_OTHER): Payer: BC Managed Care – PPO | Admitting: Cardiovascular Disease

## 2022-07-08 VITALS — BP 110/60 | HR 54 | Ht 71.0 in | Wt 149.6 lb

## 2022-07-08 DIAGNOSIS — C250 Malignant neoplasm of head of pancreas: Secondary | ICD-10-CM | POA: Diagnosis not present

## 2022-07-08 DIAGNOSIS — I480 Paroxysmal atrial fibrillation: Secondary | ICD-10-CM

## 2022-07-08 DIAGNOSIS — E43 Unspecified severe protein-calorie malnutrition: Secondary | ICD-10-CM | POA: Diagnosis not present

## 2022-07-08 DIAGNOSIS — D6481 Anemia due to antineoplastic chemotherapy: Secondary | ICD-10-CM | POA: Diagnosis not present

## 2022-07-08 NOTE — Progress Notes (Signed)
Dean [name] returns today for follow-up of his noninvasive test.  2D echo was essentially normal.  Event monitor showed occasional PVCs and short runs of nonsustained ventricular tachycardia.  He has not had any palpitations since I saw him 3 months ago.  He remains on Eliquis because of his Port-A-Cath per Dr. Benay Spice.  I will see him back as needed.  Lorretta Harp, M.D., [place], [facility], [place] [address] [place], [place]  [address]  [phone] 07/08/2022 12:22 PM

## 2022-07-08 NOTE — Patient Instructions (Signed)
Medication Instructions:  No Changes In Medications at this time.  *If you need a refill on your cardiac medications before your next appointment, please call your pharmacy*  Lab Work: None Ordered At This Time.  If you have labs (blood work) drawn today and your tests are completely normal, you will receive your results only by: [employer] (if you have MyChart) OR A paper copy in the mail If you have any lab test that is abnormal or we need to change your treatment, we will call you to review the results.  Testing/Procedures: None Ordered At This Time.   Follow-Up: At [facility], you and your health needs are our priority.  As part of our continuing mission to provide you with exceptional heart care, we have created designated Provider Care Teams.  These Care Teams include your primary Cardiologist (physician) and Advanced Practice Providers (APPs -  Physician Assistants and Nurse Practitioners) who all work together to provide you with the care you need, when you need it.  Your next appointment:   AS NEEDED   The format for your next appointment:   In Person  Provider:   Quay Burow, MD

## 2022-07-13 ENCOUNTER — Inpatient Hospital Stay: Payer: BC Managed Care – PPO

## 2022-07-13 ENCOUNTER — Inpatient Hospital Stay: Payer: BC Managed Care – PPO | Attending: Oncology

## 2022-07-13 ENCOUNTER — Inpatient Hospital Stay: Payer: BC Managed Care – PPO | Admitting: Nutrition

## 2022-07-13 ENCOUNTER — Inpatient Hospital Stay (HOSPITAL_BASED_OUTPATIENT_CLINIC_OR_DEPARTMENT_OTHER): Payer: BC Managed Care – PPO | Admitting: Nurse Practitioner

## 2022-07-13 ENCOUNTER — Encounter: Payer: Self-pay | Admitting: Nurse Practitioner

## 2022-07-13 VITALS — BP 100/68 | HR 58 | Temp 98.1°F | Resp 20 | Ht 71.0 in | Wt 147.5 lb

## 2022-07-13 DIAGNOSIS — Z452 Encounter for adjustment and management of vascular access device: Secondary | ICD-10-CM | POA: Diagnosis not present

## 2022-07-13 DIAGNOSIS — I4891 Unspecified atrial fibrillation: Secondary | ICD-10-CM | POA: Diagnosis not present

## 2022-07-13 DIAGNOSIS — Z95828 Presence of other vascular implants and grafts: Secondary | ICD-10-CM

## 2022-07-13 DIAGNOSIS — Z86718 Personal history of other venous thrombosis and embolism: Secondary | ICD-10-CM | POA: Diagnosis not present

## 2022-07-13 DIAGNOSIS — C250 Malignant neoplasm of head of pancreas: Secondary | ICD-10-CM

## 2022-07-13 DIAGNOSIS — Z9221 Personal history of antineoplastic chemotherapy: Secondary | ICD-10-CM | POA: Diagnosis not present

## 2022-07-13 DIAGNOSIS — Z7901 Long term (current) use of anticoagulants: Secondary | ICD-10-CM | POA: Insufficient documentation

## 2022-07-13 DIAGNOSIS — F319 Bipolar disorder, unspecified: Secondary | ICD-10-CM | POA: Diagnosis not present

## 2022-07-13 DIAGNOSIS — Z923 Personal history of irradiation: Secondary | ICD-10-CM | POA: Insufficient documentation

## 2022-07-13 DIAGNOSIS — R748 Abnormal levels of other serum enzymes: Secondary | ICD-10-CM | POA: Diagnosis not present

## 2022-07-13 DIAGNOSIS — Z79899 Other long term (current) drug therapy: Secondary | ICD-10-CM | POA: Insufficient documentation

## 2022-07-13 LAB — CMP (CANCER CENTER ONLY)
ALT: 37 U/L (ref 0–44)
AST: 38 U/L (ref 15–41)
Albumin: 3.2 g/dL — ABNORMAL LOW (ref 3.5–5.0)
Alkaline Phosphatase: 231 U/L — ABNORMAL HIGH (ref 38–126)
Anion gap: 7 (ref 5–15)
BUN: 21 mg/dL — ABNORMAL HIGH (ref 6–20)
CO2: 25 mmol/L (ref 22–32)
Calcium: 8.5 mg/dL — ABNORMAL LOW (ref 8.9–10.3)
Chloride: 107 mmol/L (ref 98–111)
Creatinine: 0.63 mg/dL (ref 0.61–1.24)
GFR, Estimated: >60 mL/min (ref >60)
Glucose, Bld: 96 mg/dL (ref 70–99)
Potassium: 3.9 mmol/L (ref 3.5–5.1)
Sodium: 139 mmol/L (ref 135–145)
Total Bilirubin: 0.4 mg/dL (ref 0.3–1.2)
Total Protein: 5.9 g/dL — ABNORMAL LOW (ref 6.5–8.1)

## 2022-07-13 LAB — CBC WITH DIFFERENTIAL (CANCER CENTER ONLY)
Abs Immature Granulocytes: 0 10*3/uL (ref 0.00–0.07)
Basophils Absolute: 0 10*3/uL (ref 0.0–0.1)
Basophils Relative: 1 %
Eosinophils Absolute: 0 10*3/uL (ref 0.0–0.5)
Eosinophils Relative: 2 %
HCT: 25.8 % — ABNORMAL LOW (ref 39.0–52.0)
Hemoglobin: 8.7 g/dL — ABNORMAL LOW (ref 13.0–17.0)
Immature Granulocytes: 0 %
Lymphocytes Relative: 32 %
Lymphs Abs: 0.6 10*3/uL — ABNORMAL LOW (ref 0.7–4.0)
MCH: 37 pg — ABNORMAL HIGH (ref 26.0–34.0)
MCHC: 33.7 g/dL (ref 30.0–36.0)
MCV: 109.8 fL — ABNORMAL HIGH (ref 80.0–100.0)
Monocytes Absolute: 0.3 10*3/uL (ref 0.1–1.0)
Monocytes Relative: 17 %
Neutro Abs: 0.9 10*3/uL — ABNORMAL LOW (ref 1.7–7.7)
Neutrophils Relative %: 48 %
Platelet Count: 74 10*3/uL — ABNORMAL LOW (ref 150–400)
RBC: 2.35 MIL/uL — ABNORMAL LOW (ref 4.22–5.81)
RDW: 14.9 % (ref 11.5–15.5)
WBC Count: 1.9 10*3/uL — ABNORMAL LOW (ref 4.0–10.5)
nRBC: 0 % (ref 0.0–0.2)

## 2022-07-13 MED ORDER — HEPARIN SOD (PORK) LOCK FLUSH 100 UNIT/ML IV SOLN
500.0000 [IU] | Freq: Once | INTRAVENOUS | Status: AC
  Administered 2022-07-13: 500 [IU] via INTRAVENOUS

## 2022-07-13 MED ORDER — SODIUM CHLORIDE 0.9% FLUSH
10.0000 mL | Freq: Once | INTRAVENOUS | Status: AC
  Administered 2022-07-13: 10 mL via INTRAVENOUS

## 2022-07-13 NOTE — Progress Notes (Signed)
[place] OFFICE PROGRESS NOTE   Diagnosis: Pancreas cancer  INTERVAL HISTORY:   Dean [name] today returns as scheduled.  He completed the course of radiation/Xeloda 06/16/2022.  He reports a good appetite.  By his scale at home he is gaining weight.  No abdominal pain.  No nausea or vomiting.  Bowels moving regularly.  No numbness or tingling in the hands or feet.  Energy levels better.  No shortness of breath.  Objective:  Vital signs in last 24 hours:  Blood pressure 100/68, pulse (!) 58, temperature 98.1 F (36.7 C), temperature source Oral, resp. rate 20, height '5\' 11"'$  (1.803 m), weight 147 lb 8 oz (66.9 kg), SpO2 99 %.    HEENT: No thrush or ulcers. Lymphatics: No palpable cervical, supraclavicular or axillary lymph nodes. Resp: Lungs clear bilaterally. Cardio: Regular rate and rhythm. GI: No hepatosplenomegaly.  Nontender. Vascular: No leg edema. Port-A-Cath without erythema.  Lab Results:  Lab Results  Component Value Date   WBC 1.9 (L) 07/13/2022   HGB 8.7 (L) 07/13/2022   HCT 25.8 (L) 07/13/2022   MCV 109.8 (H) 07/13/2022   PLT 74 (L) 07/13/2022   NEUTROABS 0.9 (L) 07/13/2022    Imaging:  No results found.  Medications: I have reviewed the patient's current medications.  Assessment/Plan: Pancreas cancer, status post a pancreaticoduodenectomy 11/12/2021, stage IIb (pT1,pN1) 1/11 lymph nodes, perineural invasion positive, positive margin at the SMA and SMV MRCP 09/27/2021-diffuse intrahepatic and common bile duct dilatation with abrupt termination at the level of the head of the pancreas, indistinct area of hypoenhancement in the pancreas head ERCP with placement of pancreatic duct and bile duct stents 10/04/2021 EUS 1 10/04/2021-no pancreas mass identified, FNA of pancreas head-"atypical "cells, no adenopathy CT abdomen/pelvis 10/18/2021-no focal liver abnormality, intra and extrahepatic biliary ductal dilatation with narrowing of the  common duct and the pancreas head, no enlarged abdominal lymph nodes Cycle 1 FOLFIRINOX 12/27/2021, irinotecan held with cycle 1 Cycle 2 FOLFIRINOX 01/24/2022, oxaliplatin and irinotecan dose reduced secondary to elevated liver enzymes, Udenyca Cycle 3 FOLFIRINOX 02/07/2022, same doses as cycle 2 Cycle 4 FOLFIRINOX 02/21/2022 Cycle 5 FOLFIRINOX 03/07/2022 Cycle 6 FOLFIRINOX 03/22/2022 Cycle 7 FOLFIRINOX 04/05/2022 Cycle 8 FOLFIRINOX 04/19/2022 Radiation/Xeloda 05/09/2022-06/16/2022 Acute pancreatitis 10/04/2021 Bipolar disorder Left soleal DVT 11/26/2021-apixaban Port-A-Cath placement 12/22/2021  Atrial fibrillation 12/22/2021, converted to sinus rhythm, discharged home 12/23/2021 on metoprolol 12.5 mg twice daily, on Eliquis for prior DVT. Neutropenia secondary to chemotherapy 01/10/2022, Udenyca added with cycle 2 Elevated liver enzymes, predating chemotherapy      Disposition: Mr. Normington appears stable.  He is now about a month out from completing adjuvant Xeloda and radiation.  We discussed removal of the Port-A-Cath.  Dr. Marlowe Aschoff recommendation is to leave the port in for a year.  He agrees with this plan.  We reviewed the CBC from today.  He has persistent pancytopenia.  He understands to contact us with fever, chills, other signs of infection, bleeding.  He denies symptoms from the anemia.  He is on apixaban for the left soleal DVT 11/26/2021.  From our standpoint he can discontinue anticoagulation.  We will make sure Dr. Gwenlyn Found is okay with discontinuation based on the history of atrial fibrillation.  We will communicate with him once we hear back from Dr. Gwenlyn Found.  Lab and port flush in 4 weeks.  We will see him in follow-up in approximately 10 weeks.  He will contact the office in the interim as outlined above or with any other problems.  Plan reviewed with Dr. Benay Spice.   Ned Card ANP/GNP-BC   07/13/2022  9:21 AM

## 2022-07-14 ENCOUNTER — Encounter: Payer: Self-pay | Admitting: Nurse Practitioner

## 2022-07-14 ENCOUNTER — Telehealth: Payer: Self-pay

## 2022-07-14 NOTE — Telephone Encounter (Signed)
Patient gave verbal understanding and had no questions or concerns. 

## 2022-07-14 NOTE — Telephone Encounter (Signed)
-----   Message from Owens Shark, NP sent at 07/14/2022  8:14 AM EDT ----- Please let him know I heard back from Dr. Gwenlyn Found.  He can discontinue the blood thinner.

## 2022-07-15 LAB — CANCER ANTIGEN 19-9: CA 19-9: 44 U/mL — ABNORMAL HIGH (ref 0–35)

## 2022-07-19 ENCOUNTER — Telehealth: Payer: Self-pay | Admitting: *Deleted

## 2022-07-19 NOTE — Telephone Encounter (Signed)
RETURNED PATIENT'S PHONE CALL, SPOKE WITH PATIENT. ?

## 2022-07-21 NOTE — Progress Notes (Signed)
  Radiation Oncology         [phone] ________________________________  Name: Dean [name] MRN: [id number]  Date of Service: 07/25/2022  DOB: November 19, 1966  Post Treatment Telephone Note  Diagnosis:   Stage IIB, pT1cN1M0, adenocarcinoma of the head of pancreas  Intent: Curative  Radiation Treatment Dates: 05/09/2022 through 06/16/2022 Site Technique Total Dose (Gy) Dose per Fx (Gy) Completed Fx Beam Energies  Pancreas: Pancreas IMRT 45/45 1.8 25/25 6X  Pancreas: Pancreas_Bst IMRT 5.4/5.4 1.8 3/3 6X   Narrative: The patient tolerated radiation therapy relatively well. He developed fatigue and nausea during therapy. He was using Zofran to manage his symptoms. He reports his symptoms of nausea have resolved. He will be followed in surveillance as well in the next coming weeks with medical oncology.   Impression/Plan: 1. Stage IIB, pT1cN1M0, adenocarcinoma of the head of pancreas. The patient has been doing well since completion of radiotherapy. We discussed that we would be happy to continue to follow him as needed, but he will also continue to follow up with Dr. Benay Spice in medical oncology.      Carola Rhine, PAC

## 2022-07-25 ENCOUNTER — Ambulatory Visit
Admission: RE | Admit: 2022-07-25 | Discharge: 2022-07-25 | Disposition: A | Discharge status: Home / Self Care | Payer: BC Managed Care – PPO | Source: Ambulatory Visit | Attending: Radiation Oncology | Admitting: Radiation Oncology

## 2022-07-25 DIAGNOSIS — C250 Malignant neoplasm of head of pancreas: Secondary | ICD-10-CM

## 2022-08-01 ENCOUNTER — Other Ambulatory Visit: Payer: Self-pay | Admitting: Psychiatry

## 2022-08-01 ENCOUNTER — Telehealth: Payer: Self-pay | Admitting: Psychiatry

## 2022-08-01 MED ORDER — DIVALPROEX SODIUM 500 MG PO DR TAB: DELAYED_RELEASE_TABLET | ORAL | 1 refills | Status: DC

## 2022-08-01 NOTE — Telephone Encounter (Signed)
What he says does make sense.  It is likely that he is not fully absorbing the Depakote ER.  We will switch to regular Depakote but he will need to take it is 1 twice a day and 2 tablets at night.  That should solve the problem.  Let him know that my thoughts and prayer are with him as he deals with this cancer.

## 2022-08-01 NOTE — Telephone Encounter (Signed)
Patient had a Whipple procedure 12/22 for pancreatic cancer. He has completed chemo/radiation. He said he no longer has formed stools and has noticed he is not absorbing his Depakote fully. His family comments that he is short-tempered/snappy, and just not nice to be around. He suspects this is due to not absorbing the Depakote. He said he isn't sleeping well, but that is because he has to urinate about every 2 hours. He rarely needs to take alprazolam.

## 2022-08-01 NOTE — Telephone Encounter (Signed)
Pt called having issue with Depakote he takes for bipolar. Had surgery and since he has been  seeing more and more of the med in his stool that is not dissolving. Asking for return call to explain. Contact # [phone]

## 2022-08-02 NOTE — Telephone Encounter (Signed)
Patient notified of recommendations and Rx.

## 2022-08-10 ENCOUNTER — Inpatient Hospital Stay: Payer: BC Managed Care – PPO | Attending: Oncology

## 2022-08-10 ENCOUNTER — Inpatient Hospital Stay: Payer: BC Managed Care – PPO

## 2022-08-10 DIAGNOSIS — C250 Malignant neoplasm of head of pancreas: Secondary | ICD-10-CM | POA: Insufficient documentation

## 2022-08-10 LAB — CBC WITH DIFFERENTIAL (CANCER CENTER ONLY)
Abs Immature Granulocytes: 0.01 10*3/uL (ref 0.00–0.07)
Basophils Absolute: 0 10*3/uL (ref 0.0–0.1)
Basophils Relative: 0 %
Eosinophils Absolute: 0 10*3/uL (ref 0.0–0.5)
Eosinophils Relative: 0 %
HCT: 26.2 % — ABNORMAL LOW (ref 39.0–52.0)
Hemoglobin: 8.9 g/dL — ABNORMAL LOW (ref 13.0–17.0)
Immature Granulocytes: 0 %
Lymphocytes Relative: 15 %
Lymphs Abs: 0.5 10*3/uL — ABNORMAL LOW (ref 0.7–4.0)
MCH: 37.4 pg — ABNORMAL HIGH (ref 26.0–34.0)
MCHC: 34 g/dL (ref 30.0–36.0)
MCV: 110.1 fL — ABNORMAL HIGH (ref 80.0–100.0)
Monocytes Absolute: 0.5 10*3/uL (ref 0.1–1.0)
Monocytes Relative: 15 %
Neutro Abs: 2.4 10*3/uL (ref 1.7–7.7)
Neutrophils Relative %: 70 %
Platelet Count: 88 10*3/uL — ABNORMAL LOW (ref 150–400)
RBC: 2.38 MIL/uL — ABNORMAL LOW (ref 4.22–5.81)
RDW: 14 % (ref 11.5–15.5)
WBC Count: 3.5 10*3/uL — ABNORMAL LOW (ref 4.0–10.5)
nRBC: 0 % (ref 0.0–0.2)

## 2022-08-10 MED ORDER — SODIUM CHLORIDE 0.9% FLUSH
10.0000 mL | Freq: Once | INTRAVENOUS | Status: AC
  Administered 2022-08-10: 10 mL via INTRAVENOUS

## 2022-08-10 MED ORDER — HEPARIN SOD (PORK) LOCK FLUSH 100 UNIT/ML IV SOLN
500.0000 [IU] | Freq: Once | INTRAVENOUS | Status: AC
  Administered 2022-08-10: 500 [IU] via INTRAVENOUS

## 2022-08-11 ENCOUNTER — Telehealth: Payer: Self-pay | Admitting: *Deleted

## 2022-08-11 NOTE — Telephone Encounter (Signed)
Left VM that counts have improved. May see results in [place].

## 2022-08-11 NOTE — Telephone Encounter (Signed)
-----   Message from Owens Shark, NP sent at 08/11/2022  2:42 PM EDT ----- Please let him know blood counts are better. F/u as scheduled.

## 2022-08-22 DIAGNOSIS — E039 Hypothyroidism, unspecified: Secondary | ICD-10-CM | POA: Diagnosis not present

## 2022-08-24 ENCOUNTER — Other Ambulatory Visit: Payer: Self-pay | Admitting: Psychiatry

## 2022-09-02 ENCOUNTER — Telehealth: Payer: Self-pay | Admitting: *Deleted

## 2022-09-02 ENCOUNTER — Encounter: Payer: Self-pay | Admitting: *Deleted

## 2022-09-02 ENCOUNTER — Other Ambulatory Visit: Payer: Self-pay | Admitting: Oncology

## 2022-09-02 DIAGNOSIS — C259 Malignant neoplasm of pancreas, unspecified: Secondary | ICD-10-CM | POA: Diagnosis not present

## 2022-09-02 DIAGNOSIS — C250 Malignant neoplasm of head of pancreas: Secondary | ICD-10-CM

## 2022-09-02 DIAGNOSIS — R600 Localized edema: Secondary | ICD-10-CM | POA: Diagnosis not present

## 2022-09-02 NOTE — Telephone Encounter (Signed)
Notified Dean [name] of paracentesis at [facility] on 10/3 at 1030/1100 and that Dr. Benay Spice wants to see him on 10/02 at 1:40 pm. Also sent this via [place].

## 2022-09-05 ENCOUNTER — Inpatient Hospital Stay: Payer: BC Managed Care – PPO | Attending: Oncology | Admitting: Oncology

## 2022-09-05 ENCOUNTER — Telehealth: Payer: Self-pay | Admitting: Oncology

## 2022-09-05 ENCOUNTER — Other Ambulatory Visit: Payer: Self-pay | Admitting: Family Medicine

## 2022-09-05 ENCOUNTER — Encounter: Payer: Self-pay | Admitting: *Deleted

## 2022-09-05 VITALS — BP 112/71 | HR 60 | Temp 98.1°F | Resp 18 | Ht 71.0 in | Wt 165.8 lb

## 2022-09-05 DIAGNOSIS — R188 Other ascites: Secondary | ICD-10-CM | POA: Diagnosis not present

## 2022-09-05 DIAGNOSIS — F319 Bipolar disorder, unspecified: Secondary | ICD-10-CM | POA: Diagnosis not present

## 2022-09-05 DIAGNOSIS — D701 Agranulocytosis secondary to cancer chemotherapy: Secondary | ICD-10-CM | POA: Diagnosis not present

## 2022-09-05 DIAGNOSIS — Z9221 Personal history of antineoplastic chemotherapy: Secondary | ICD-10-CM | POA: Insufficient documentation

## 2022-09-05 DIAGNOSIS — Z23 Encounter for immunization: Secondary | ICD-10-CM | POA: Diagnosis not present

## 2022-09-05 DIAGNOSIS — R140 Abdominal distension (gaseous): Secondary | ICD-10-CM | POA: Insufficient documentation

## 2022-09-05 DIAGNOSIS — Z7901 Long term (current) use of anticoagulants: Secondary | ICD-10-CM | POA: Insufficient documentation

## 2022-09-05 DIAGNOSIS — I4891 Unspecified atrial fibrillation: Secondary | ICD-10-CM | POA: Insufficient documentation

## 2022-09-05 DIAGNOSIS — Z86718 Personal history of other venous thrombosis and embolism: Secondary | ICD-10-CM | POA: Diagnosis not present

## 2022-09-05 DIAGNOSIS — C259 Malignant neoplasm of pancreas, unspecified: Secondary | ICD-10-CM

## 2022-09-05 DIAGNOSIS — T451X5A Adverse effect of antineoplastic and immunosuppressive drugs, initial encounter: Secondary | ICD-10-CM | POA: Insufficient documentation

## 2022-09-05 DIAGNOSIS — Z452 Encounter for adjustment and management of vascular access device: Secondary | ICD-10-CM | POA: Diagnosis not present

## 2022-09-05 DIAGNOSIS — C250 Malignant neoplasm of head of pancreas: Secondary | ICD-10-CM | POA: Diagnosis not present

## 2022-09-05 NOTE — Progress Notes (Signed)
Labs received from [facility] on 09/02/22. Serum creatinine 0.67. Placed on MD desk for review.

## 2022-09-05 NOTE — Progress Notes (Signed)
[place] OFFICE PROGRESS NOTE   Diagnosis: Pancreas cancer  INTERVAL HISTORY:   Mr Flock reports a 3-week history of progressive abdominal distention and lower extremity edema.  He was evaluated at [place] on 09/02/2022.  I was contacted by the provider for medical medicine on 09/02/2022.  She feels he has developed ascites.  He reports a good appetite.  He has rectal urgency.  The leg edema has improved over the past few days.  He has difficulty taking a deep breath.  He discontinued apixaban in August.  Objective:  Vital signs in last 24 hours:  Blood pressure 112/71, pulse 60, temperature 98.1 F (36.7 C), temperature source Oral, resp. rate 18, height '5\' 11"'$  (1.803 m), weight 165 lb 12.8 oz (75.2 kg), SpO2 100 %.   Lymphatics: No cervical, supraclavicular, axillary, or inguinal nodes Resp: Coarse end inspiratory rales at the posterior base bilaterally, no respiratory distress Cardio: Regular rate and rhythm GI: No hepatosplenomegaly no mass, distended with ascites Vascular: 1+ edema at the lower leg bilaterally    Portacath/PICC-without erythema  Lab Results:  Lab Results  Component Value Date   WBC 3.5 (L) 08/10/2022   HGB 8.9 (L) 08/10/2022   HCT 26.2 (L) 08/10/2022   MCV 110.1 (H) 08/10/2022   PLT 88 (L) 08/10/2022   NEUTROABS 2.4 08/10/2022    CMP  Lab Results  Component Value Date   NA 139 07/13/2022   K 3.9 07/13/2022   CL 107 07/13/2022   CO2 25 07/13/2022   GLUCOSE 96 07/13/2022   BUN 21 (H) 07/13/2022   CREATININE 0.63 07/13/2022   CALCIUM 8.5 (L) 07/13/2022   PROT 5.9 (L) 07/13/2022   ALBUMIN 3.2 (L) 07/13/2022   AST 38 07/13/2022   ALT 37 07/13/2022   ALKPHOS 231 (H) 07/13/2022   BILITOT 0.4 07/13/2022   GFRNONAA >60 07/13/2022   GFRAA >60 08/19/2015    Lab Results  Component Value Date   XIP382 44 (H) 07/13/2022      Medications: I have reviewed the patient's current  medications.   Assessment/Plan: Pancreas cancer, status post a pancreaticoduodenectomy 11/12/2021, stage IIb (pT1,pN1) 1/11 lymph nodes, perineural invasion positive, positive margin at the SMA and SMV MRCP 09/27/2021-diffuse intrahepatic and common bile duct dilatation with abrupt termination at the level of the head of the pancreas, indistinct area of hypoenhancement in the pancreas head ERCP with placement of pancreatic duct and bile duct stents 10/04/2021 EUS 1 10/04/2021-no pancreas mass identified, FNA of pancreas head-"atypical "cells, no adenopathy CT abdomen/pelvis 10/18/2021-no focal liver abnormality, intra and extrahepatic biliary ductal dilatation with narrowing of the common duct and the pancreas head, no enlarged abdominal lymph nodes Cycle 1 FOLFIRINOX 12/27/2021, irinotecan held with cycle 1 Cycle 2 FOLFIRINOX 01/24/2022, oxaliplatin and irinotecan dose reduced secondary to elevated liver enzymes, Udenyca Cycle 3 FOLFIRINOX 02/07/2022, same doses as cycle 2 Cycle 4 FOLFIRINOX 02/21/2022 Cycle 5 FOLFIRINOX 03/07/2022 Cycle 6 FOLFIRINOX 03/22/2022 Cycle 7 FOLFIRINOX 04/05/2022 Cycle 8 FOLFIRINOX 04/19/2022 Radiation/Xeloda 05/09/2022-06/16/2022 Acute pancreatitis 10/04/2021 Bipolar disorder Left soleal DVT 11/26/2021-apixaban, discontinued 07/14/2022 Port-A-Cath placement 12/22/2021  Atrial fibrillation 12/22/2021, converted to sinus rhythm, discharged home 12/23/2021 on metoprolol 12.5 mg twice daily, on Eliquis for prior DVT. Neutropenia secondary to chemotherapy 01/10/2022, Udenyca added with cycle 2 Elevated liver enzymes, predating chemotherapy       Disposition: Mr Levandowski has a history of pancreas cancer.  He completed adjuvant therapy in July.  He has new onset ascites and lower extremity edema.  I  discussed the differential diagnosis with him.  His symptoms could be related to a thrombotic event, recurrent tumor, or another etiology.  He will be referred for a  diagnostic/therapeutic paracentesis.  He will be scheduled for a CT abdomen/pelvis this week.  He will return for an office visit on 09/12/2022.  Betsy Coder, MD  09/05/2022  12:51 PM

## 2022-09-05 NOTE — Telephone Encounter (Signed)
Per schedule message, Schedule with Dr. Benay Spice on 09/05/22 at 1:40 pm for 30 minutes please. Call him to confirm.  Called patient and he advised that he will not be able to come in at that time due to work. Advised that we will call back once we speak to Dr. Benay Spice

## 2022-09-06 ENCOUNTER — Ambulatory Visit (HOSPITAL_COMMUNITY)
Admission: RE | Admit: 2022-09-06 | Discharge: 2022-09-06 | Disposition: A | Discharge status: Home / Self Care | Payer: BC Managed Care – PPO | Source: Ambulatory Visit | Attending: Oncology | Admitting: Oncology

## 2022-09-06 DIAGNOSIS — C250 Malignant neoplasm of head of pancreas: Secondary | ICD-10-CM | POA: Insufficient documentation

## 2022-09-06 DIAGNOSIS — R188 Other ascites: Secondary | ICD-10-CM | POA: Diagnosis not present

## 2022-09-06 DIAGNOSIS — Z90410 Acquired total absence of pancreas: Secondary | ICD-10-CM | POA: Diagnosis not present

## 2022-09-06 MED ORDER — LIDOCAINE HCL 1 % IJ SOLN
INTRAMUSCULAR | Status: AC
  Administered 2022-09-06: 15 mL
  Filled 2022-09-06: qty 20

## 2022-09-06 NOTE — Procedures (Signed)
PROCEDURE SUMMARY:  Successful US guided diagnostic and therapeutic paracentesis from RLQ.  Yielded 3.6 L of clear, yellow fluid.  No immediate complications.  Pt tolerated well.   Specimen was sent for labs.  EBL < 1 mL  Tyson Alias, AGNP 09/06/2022 12:44 PM

## 2022-09-07 LAB — CYTOLOGY - NON PAP

## 2022-09-08 ENCOUNTER — Inpatient Hospital Stay: Payer: BC Managed Care – PPO

## 2022-09-08 ENCOUNTER — Encounter (HOSPITAL_BASED_OUTPATIENT_CLINIC_OR_DEPARTMENT_OTHER): Payer: Self-pay

## 2022-09-08 ENCOUNTER — Ambulatory Visit (HOSPITAL_BASED_OUTPATIENT_CLINIC_OR_DEPARTMENT_OTHER)
Admission: RE | Admit: 2022-09-08 | Discharge: 2022-09-08 | Disposition: A | Discharge status: Home / Self Care | Payer: BC Managed Care – PPO | Source: Ambulatory Visit | Attending: Oncology | Admitting: Oncology

## 2022-09-08 DIAGNOSIS — F319 Bipolar disorder, unspecified: Secondary | ICD-10-CM | POA: Diagnosis not present

## 2022-09-08 DIAGNOSIS — I4891 Unspecified atrial fibrillation: Secondary | ICD-10-CM | POA: Diagnosis not present

## 2022-09-08 DIAGNOSIS — R140 Abdominal distension (gaseous): Secondary | ICD-10-CM | POA: Diagnosis not present

## 2022-09-08 DIAGNOSIS — Z9221 Personal history of antineoplastic chemotherapy: Secondary | ICD-10-CM | POA: Diagnosis not present

## 2022-09-08 DIAGNOSIS — C259 Malignant neoplasm of pancreas, unspecified: Secondary | ICD-10-CM | POA: Diagnosis not present

## 2022-09-08 DIAGNOSIS — D701 Agranulocytosis secondary to cancer chemotherapy: Secondary | ICD-10-CM | POA: Diagnosis not present

## 2022-09-08 DIAGNOSIS — Z7901 Long term (current) use of anticoagulants: Secondary | ICD-10-CM | POA: Diagnosis not present

## 2022-09-08 DIAGNOSIS — Z86718 Personal history of other venous thrombosis and embolism: Secondary | ICD-10-CM | POA: Diagnosis not present

## 2022-09-08 DIAGNOSIS — T451X5A Adverse effect of antineoplastic and immunosuppressive drugs, initial encounter: Secondary | ICD-10-CM | POA: Diagnosis not present

## 2022-09-08 DIAGNOSIS — Z23 Encounter for immunization: Secondary | ICD-10-CM | POA: Diagnosis not present

## 2022-09-08 DIAGNOSIS — C250 Malignant neoplasm of head of pancreas: Secondary | ICD-10-CM | POA: Insufficient documentation

## 2022-09-08 DIAGNOSIS — Z452 Encounter for adjustment and management of vascular access device: Secondary | ICD-10-CM | POA: Diagnosis not present

## 2022-09-08 DIAGNOSIS — R188 Other ascites: Secondary | ICD-10-CM | POA: Diagnosis not present

## 2022-09-08 DIAGNOSIS — K769 Liver disease, unspecified: Secondary | ICD-10-CM | POA: Diagnosis not present

## 2022-09-08 LAB — POCT I-STAT CREATININE: Creatinine, Ser: 0.5 mg/dL — ABNORMAL LOW (ref 0.61–1.24)

## 2022-09-08 MED ORDER — HEPARIN SOD (PORK) LOCK FLUSH 100 UNIT/ML IV SOLN
500.0000 [IU] | Freq: Once | INTRAVENOUS | Status: AC
  Administered 2022-09-08: 500 [IU] via INTRAVENOUS

## 2022-09-08 MED ORDER — IOHEXOL 300 MG/ML  SOLN
100.0000 mL | Freq: Once | INTRAMUSCULAR | Status: AC | PRN
  Administered 2022-09-08: 85 mL via INTRAVENOUS

## 2022-09-12 ENCOUNTER — Other Ambulatory Visit: Payer: Self-pay

## 2022-09-12 ENCOUNTER — Encounter: Payer: Self-pay | Admitting: Nurse Practitioner

## 2022-09-12 ENCOUNTER — Inpatient Hospital Stay (HOSPITAL_BASED_OUTPATIENT_CLINIC_OR_DEPARTMENT_OTHER): Payer: BC Managed Care – PPO | Admitting: Nurse Practitioner

## 2022-09-12 VITALS — BP 114/70 | HR 55 | Temp 98.2°F | Resp 18 | Ht 71.0 in | Wt 158.2 lb

## 2022-09-12 DIAGNOSIS — T451X5A Adverse effect of antineoplastic and immunosuppressive drugs, initial encounter: Secondary | ICD-10-CM | POA: Diagnosis not present

## 2022-09-12 DIAGNOSIS — C250 Malignant neoplasm of head of pancreas: Secondary | ICD-10-CM | POA: Diagnosis not present

## 2022-09-12 DIAGNOSIS — Z86718 Personal history of other venous thrombosis and embolism: Secondary | ICD-10-CM | POA: Diagnosis not present

## 2022-09-12 DIAGNOSIS — Z7901 Long term (current) use of anticoagulants: Secondary | ICD-10-CM | POA: Diagnosis not present

## 2022-09-12 DIAGNOSIS — I4891 Unspecified atrial fibrillation: Secondary | ICD-10-CM | POA: Diagnosis not present

## 2022-09-12 DIAGNOSIS — D701 Agranulocytosis secondary to cancer chemotherapy: Secondary | ICD-10-CM | POA: Diagnosis not present

## 2022-09-12 DIAGNOSIS — Z23 Encounter for immunization: Secondary | ICD-10-CM | POA: Diagnosis not present

## 2022-09-12 DIAGNOSIS — F319 Bipolar disorder, unspecified: Secondary | ICD-10-CM | POA: Diagnosis not present

## 2022-09-12 DIAGNOSIS — Z452 Encounter for adjustment and management of vascular access device: Secondary | ICD-10-CM | POA: Diagnosis not present

## 2022-09-12 DIAGNOSIS — R188 Other ascites: Secondary | ICD-10-CM | POA: Diagnosis not present

## 2022-09-12 DIAGNOSIS — R140 Abdominal distension (gaseous): Secondary | ICD-10-CM | POA: Diagnosis not present

## 2022-09-12 DIAGNOSIS — Z9221 Personal history of antineoplastic chemotherapy: Secondary | ICD-10-CM | POA: Diagnosis not present

## 2022-09-12 MED ORDER — TRAMADOL HCL 50 MG PO TABS: 50.0000 mg | ORAL_TABLET | Freq: Three times a day (TID) | ORAL | 0 refills | Status: DC | PRN

## 2022-09-12 NOTE — Progress Notes (Signed)
[place] OFFICE PROGRESS NOTE   Diagnosis:  Pancreas cancer  INTERVAL HISTORY:   Dean [name] returns as scheduled.  He noted temporary relief after the paracentesis procedure was performed.  He feels the fluid has partially reaccumulated.  He has intermittent pain at the right lower abdomen.  Continued lower extremity edema.  Good appetite.  Objective:  Vital signs in last 24 hours:  Blood pressure 114/70, pulse (!) 55, temperature 98.2 F (36.8 C), temperature source Oral, resp. rate 18, height '5\' 11"'$  (1.803 m), weight 158 lb 3.2 oz (71.8 kg), SpO2 100 %.    HEENT: No thrush or ulcers. Resp: Lungs clear bilaterally. Cardio: Regular rate and rhythm. GI: Abdomen distended consistent with ascites. Vascular: Pitting edema lower leg bilaterally.  Calves soft and nontender. Neuro: Alert and oriented. Port-A-Cath without erythema.  Lab Results:  Lab Results  Component Value Date   WBC 3.5 (L) 08/10/2022   HGB 8.9 (L) 08/10/2022   HCT 26.2 (L) 08/10/2022   MCV 110.1 (H) 08/10/2022   PLT 88 (L) 08/10/2022   NEUTROABS 2.4 08/10/2022    Imaging:  No results found.  Medications: I have reviewed the patient's current medications.  Assessment/Plan: Pancreas cancer, status post a pancreaticoduodenectomy 11/12/2021, stage IIb (pT1,pN1) 1/11 lymph nodes, perineural invasion positive, positive margin at the SMA and SMV MRCP 09/27/2021-diffuse intrahepatic and common bile duct dilatation with abrupt termination at the level of the head of the pancreas, indistinct area of hypoenhancement in the pancreas head ERCP with placement of pancreatic duct and bile duct stents 10/04/2021 EUS 1 10/04/2021-no pancreas mass identified, FNA of pancreas head-"atypical "cells, no adenopathy CT abdomen/pelvis 10/18/2021-no focal liver abnormality, intra and extrahepatic biliary ductal dilatation with narrowing of the common duct and the pancreas head, no enlarged abdominal lymph  nodes Cycle 1 FOLFIRINOX 12/27/2021, irinotecan held with cycle 1 Cycle 2 FOLFIRINOX 01/24/2022, oxaliplatin and irinotecan dose reduced secondary to elevated liver enzymes, Udenyca Cycle 3 FOLFIRINOX 02/07/2022, same doses as cycle 2 Cycle 4 FOLFIRINOX 02/21/2022 Cycle 5 FOLFIRINOX 03/07/2022 Cycle 6 FOLFIRINOX 03/22/2022 Cycle 7 FOLFIRINOX 04/05/2022 Cycle 8 FOLFIRINOX 04/19/2022 Radiation/Xeloda 05/09/2022-06/16/2022 Paracentesis 09/06/2022-reactive mesothelial cells CTs 09/08/2022-multiple new right lobe liver lesions, ill-defined soft tissue pancreatic anastomosis, moderate volume ascites Acute pancreatitis 10/04/2021 Bipolar disorder Left soleal DVT 11/26/2021-apixaban, discontinued 07/14/2022 Port-A-Cath placement 12/22/2021  Atrial fibrillation 12/22/2021, converted to sinus rhythm, discharged home 12/23/2021 on metoprolol 12.5 mg twice daily, on Eliquis for prior DVT. Neutropenia secondary to chemotherapy 01/10/2022, Udenyca added with cycle 2 Elevated liver enzymes, predating chemotherapy    Disposition: Dean [name] has a history of pancreas cancer.  He recently presented with ascites.  A paracentesis performed 09/06/2022 showed reactive mesothelial cells.  CT scans 09/08/2022 showed multiple new right liver lesions.  CT report/images reviewed with him and his wife at today's visit.  They understand the liver lesions are suspicious for metastatic disease.  We are referring him for a biopsy to confirm.  He agrees with this plan.  He is having intermittent right-sided abdominal pain.  Prescription sent to his pharmacy for tramadol.  He understands he should not drive while taking pain medication.  He is scheduled to return for follow-up 09/21/2022.  We are available to see him sooner if needed.  Patient seen with Dr. Benay Spice.    Ned Card ANP/GNP-BC   09/12/2022  8:14 AM This was a shared visit with Ned Card.  Dean [name] is interviewed and examined.  We reviewed the CT findings and  images with  him.  The clinical presentation and images are most consistent with recurrent pancreas cancer.  He will be referred for a diagnostic biopsy of a liver lesion.  He will have another palliative/diagnostic paracentesis as needed.  We will see him after the biopsy to discuss treatment options.  I was present for greater than 50% of today's visit.  I performed medical decision making.  Julieanne Manson, MD

## 2022-09-13 ENCOUNTER — Telehealth: Payer: Self-pay

## 2022-09-13 ENCOUNTER — Encounter: Payer: Self-pay | Admitting: Nurse Practitioner

## 2022-09-13 ENCOUNTER — Other Ambulatory Visit: Payer: Self-pay

## 2022-09-13 ENCOUNTER — Encounter: Payer: Self-pay | Admitting: General Practice

## 2022-09-13 DIAGNOSIS — C250 Malignant neoplasm of head of pancreas: Secondary | ICD-10-CM

## 2022-09-13 NOTE — Progress Notes (Unsigned)
Arne Cleveland, MD  [place], Leotta Weingarten J, NT Ok   [place]  core seg 5 met  WILL NEED PREPROCEDURE/CONCURRENT  US PARACENTESIS (previous cyto only reactive)   DDH        Previous Messages    ----- Message -----  From: Allen Kell, NT  Sent: 09/12/2022   9:21 AM EDT  To: Roosvelt Maser; Ir Procedure Requests  Subject: CT Biopsy                                       Procedure:US CORE BIOPSY (LIVER)   Reason: liver lesions, h/o pancreas cancer   History: CT ABD and Pelvis completed in chart   Provider: Owens Shark   Contact: [phone]

## 2022-09-13 NOTE — Telephone Encounter (Signed)
Patient's wife called with some concerns. She want to see if she can get some counseling. She stated she need help with cope. I placed a referral to the social worker and mailed out in formation on support group.

## 2022-09-14 ENCOUNTER — Other Ambulatory Visit: Payer: Self-pay | Admitting: Student

## 2022-09-14 ENCOUNTER — Inpatient Hospital Stay: Payer: BC Managed Care – PPO | Admitting: Licensed Clinical Social Worker

## 2022-09-14 ENCOUNTER — Other Ambulatory Visit: Payer: Self-pay | Admitting: Radiology

## 2022-09-14 DIAGNOSIS — C250 Malignant neoplasm of head of pancreas: Secondary | ICD-10-CM

## 2022-09-14 NOTE — Progress Notes (Signed)
[facility] CSW Progress Note  Clinical Education officer, museum contacted caregiver by phone to assess emotional needs.  Received referral from nurse.  CSW has provided counseling to patient's wife, Dean [name], in the past.    She stated she is having difficulty because her husband is having a liver biopsy tomorrow and is worried about the outcome.  They are supposed to be going on vacation on 09/23/22 and are meeting with Dr. Benay Spice on 09/21/22.  She stated she wants to meet with CSW when she returns from vacation.  CSW provided supportive counseling.    [name] Kashay Cavenaugh, LCSW

## 2022-09-15 ENCOUNTER — Encounter (HOSPITAL_COMMUNITY): Payer: Self-pay

## 2022-09-15 ENCOUNTER — Ambulatory Visit (HOSPITAL_COMMUNITY)
Admission: RE | Admit: 2022-09-15 | Discharge: 2022-09-15 | Disposition: A | Discharge status: Home / Self Care | Payer: BC Managed Care – PPO | Source: Ambulatory Visit | Attending: Nurse Practitioner | Admitting: Nurse Practitioner

## 2022-09-15 ENCOUNTER — Other Ambulatory Visit: Payer: Self-pay

## 2022-09-15 DIAGNOSIS — C250 Malignant neoplasm of head of pancreas: Secondary | ICD-10-CM | POA: Insufficient documentation

## 2022-09-15 DIAGNOSIS — Z87891 Personal history of nicotine dependence: Secondary | ICD-10-CM | POA: Insufficient documentation

## 2022-09-15 DIAGNOSIS — C787 Secondary malignant neoplasm of liver and intrahepatic bile duct: Secondary | ICD-10-CM | POA: Diagnosis not present

## 2022-09-15 DIAGNOSIS — R188 Other ascites: Secondary | ICD-10-CM | POA: Diagnosis not present

## 2022-09-15 DIAGNOSIS — K7689 Other specified diseases of liver: Secondary | ICD-10-CM | POA: Diagnosis not present

## 2022-09-15 DIAGNOSIS — Z8507 Personal history of malignant neoplasm of pancreas: Secondary | ICD-10-CM | POA: Diagnosis not present

## 2022-09-15 LAB — CBC
HCT: 29.1 % — ABNORMAL LOW (ref 39.0–52.0)
Hemoglobin: 9.5 g/dL — ABNORMAL LOW (ref 13.0–17.0)
MCH: 36.3 pg — ABNORMAL HIGH (ref 26.0–34.0)
MCHC: 32.6 g/dL (ref 30.0–36.0)
MCV: 111.1 fL — ABNORMAL HIGH (ref 80.0–100.0)
Platelets: 91 10*3/uL — ABNORMAL LOW (ref 150–400)
RBC: 2.62 MIL/uL — ABNORMAL LOW (ref 4.22–5.81)
RDW: 14.2 % (ref 11.5–15.5)
WBC: 2.3 10*3/uL — ABNORMAL LOW (ref 4.0–10.5)
nRBC: 0 % (ref 0.0–0.2)

## 2022-09-15 LAB — PROTIME-INR
INR: 1.1 (ref 0.8–1.2)
Prothrombin Time: 14.1 seconds (ref 11.4–15.2)

## 2022-09-15 MED ORDER — FENTANYL CITRATE (PF) 100 MCG/2ML IJ SOLN
INTRAMUSCULAR | Status: AC | PRN
  Administered 2022-09-15 (×2): 25 ug via INTRAVENOUS

## 2022-09-15 MED ORDER — FENTANYL CITRATE (PF) 100 MCG/2ML IJ SOLN
INTRAMUSCULAR | Status: AC
  Filled 2022-09-15: qty 2

## 2022-09-15 MED ORDER — GELATIN ABSORBABLE 12-7 MM EX MISC
CUTANEOUS | Status: AC
  Filled 2022-09-15: qty 1

## 2022-09-15 MED ORDER — MIDAZOLAM HCL 2 MG/2ML IJ SOLN
INTRAMUSCULAR | Status: AC
  Filled 2022-09-15: qty 2

## 2022-09-15 MED ORDER — MIDAZOLAM HCL 2 MG/2ML IJ SOLN
INTRAMUSCULAR | Status: AC | PRN
  Administered 2022-09-15 (×3): .5 mg via INTRAVENOUS

## 2022-09-15 MED ORDER — LIDOCAINE HCL (PF) 1 % IJ SOLN
INTRAMUSCULAR | Status: AC
  Filled 2022-09-15: qty 30

## 2022-09-15 MED ORDER — SODIUM CHLORIDE 0.9 % IV SOLN: INTRAVENOUS | Status: DC

## 2022-09-15 NOTE — Procedures (Signed)
Interventional Radiology Procedure Note  Procedure: US guided paracentesis and US Guided Biopsy of right lobe liver lesion  Complications: None  Estimated Blood Loss: < 10 mL  Findings:  Paracentesis in right perihepatic space via 6 Fr Safety Centesis catheter yielding 4.3 L of ascites during procedure. 1.2 L sample sent for cytology.  79 G core biopsy of 2 cm right lobe liver lesion performed under US guidance.  Two core samples obtained and sent to Pathology.  Venetia Night. Kathlene Cote, M.D Pager:  [phone]

## 2022-09-15 NOTE — H&P (Signed)
Chief Complaint: Patient was seen in consultation today for liver lesion   at the request of Owens Shark  Referring Physician(s): Owens Shark  Supervising Physician: Aletta Edouard  Patient Status: [facility] - Out-pt  History of Present Illness: Dean [name] is a 56 y.o. male who is followed by oncology for diagnosis of pancreatic cancer 11/12/2021. He has recently developed ascites and underwent paracentesis 09/06/22 that showed reactive mesothelial cells. CT AP 09/08/22 showed multiple new right liver lesions suspicious for metastasis. Pt has been referred to IR for liver lesion biopsy by Ned Card, NP.   Past Medical History:  Diagnosis Date   Bipolar 1 disorder ([place])    DVT (deep venous thrombosis) ([place]) 11/2021   left leg   Family history of breast cancer 12/16/2021   GERD (gastroesophageal reflux disease)    Hypothyroidism    Pancreas cancer ([place]) 11/12/2021   Thyroid disease     Past Surgical History:  Procedure Laterality Date   BACK SURGERY     BILIARY BRUSHING  10/04/2021   Procedure: BILIARY BRUSHING;  Surgeon: Clarene Essex, MD;  Location: [facility];  Service: Endoscopy;;   BILIARY STENT PLACEMENT N/A 10/04/2021   Procedure: BILIARY STENT PLACEMENT;  Surgeon: Clarene Essex, MD;  Location: [facility];  Service: Endoscopy;  Laterality: N/A;   ERCP Bilateral 10/04/2021   Procedure: ENDOSCOPIC RETROGRADE CHOLANGIOPANCREATOGRAPHY (ERCP);  Surgeon: Clarene Essex, MD;  Location: [facility];  Service: Endoscopy;  Laterality: Bilateral;   ESOPHAGOGASTRODUODENOSCOPY (EGD) WITH PROPOFOL N/A 10/04/2021   Procedure: ESOPHAGOGASTRODUODENOSCOPY (EGD) WITH PROPOFOL;  Surgeon: Clarene Essex, MD;  [facility];  Service: Endoscopy;  Laterality: N/A;   FINE NEEDLE ASPIRATION  10/04/2021   Procedure: FINE NEEDLE ASPIRATION (FNA) LINEAR;  Surgeon: Clarene Essex, MD;  [facility]: [facility];  Service: Endoscopy;;   FOREIGN BODY REMOVAL  10/04/2021   Procedure:  FOREIGN BODY REMOVAL;  Surgeon: Clarene Essex, MD;  [facility];  Service: Endoscopy;;   KNEE SURGERY     LAPAROSCOPY N/A 11/12/2021   Procedure: DIAGNOSTIC LAPAROSCOPY;  Surgeon: Stark Klein, MD;  Location: [place];  Service: General;  Laterality: N/A;   NOSE SURGERY     PANCREATIC STENT PLACEMENT  10/04/2021   Procedure: PANCREATIC STENT PLACEMENT;  Surgeon: Clarene Essex, MD;  [facility];  Service: Endoscopy;;   PORTACATH PLACEMENT Left 12/22/2021   Procedure: INSERTION PORT-A-CATH;  Surgeon: Stark Klein, MD;  Location: [place];  Service: General;  Laterality: Left;   ROTATOR CUFF REPAIR     SPHINCTEROTOMY  10/04/2021   Procedure: Joan Mayans;  Surgeon: Clarene Essex, MD;  [facility];  Service: Endoscopy;;   STENT REMOVAL  10/04/2021   Procedure: STENT REMOVAL;  Surgeon: Clarene Essex, MD;  [facility];  Service: Endoscopy;;   UPPER ESOPHAGEAL ENDOSCOPIC ULTRASOUND (EUS)  10/04/2021   Procedure: UPPER ESOPHAGEAL ENDOSCOPIC ULTRASOUND (EUS);  Surgeon: Clarene Essex, MD;  Location: [facility];  Service: Endoscopy;;   WHIPPLE PROCEDURE N/A 11/12/2021   Procedure: WHIPPLE PROCEDURE;  Surgeon: Stark Klein, MD;  Location: [place];  Service: General;  Laterality: N/A;    Allergies: Erythromycin and Nsaids  Medications: Prior to Admission medications   Medication Sig Start Date End Date Taking? Authorizing Provider  ALPRAZolam Duanne Moron) 0.5 MG tablet TAKE 1 TABLET BY MOUTH AT BEDTIME AS NEEDED FOR ANXIETY 05/13/22  Yes Cottle, Billey Co., MD  CREON [phone] units CPEP capsule Take 2-3 capsules by mouth See admin instructions. Take 3 capsules with each meal and 2 capsules  with each snack 01/03/22  Yes [provider]  divalproex (DEPAKOTE) 500 MG DR tablet 1 tablet twice daily and 2 tablets at night 08/01/22  Yes Cottle, Billey Co., MD  levothyroxine (SYNTHROID) 175 MCG tablet Take 175 mcg by mouth daily before breakfast.   Yes [provider]  lidocaine-prilocaine (EMLA) cream Apply 1 application topically as needed. Apply 1/2 tablespoon to port site and cover with Press-and-Seal 2 hours prior to access to numb site. Do not start until 14 days after port is inserted. 12/20/21  Yes Ladell Pier, MD  ondansetron (ZOFRAN) 8 MG tablet TAKE 1 TABLET BY MOUTH EVERY 8 HOURS AS NEEDED FOR NAUSEA OR VOMITING. START TAKING 72 HOURS AFTER IV CHEMOTHERAPY TREATMENT GIVEN 06/16/22  Yes Kyung Rudd, MD  traMADol (ULTRAM) 50 MG tablet Take 1 tablet (50 mg total) by mouth every 8 (eight) hours as needed. Do not drive while taking 01/11/36  Yes Owens Shark, NP     Family History  Problem Relation Age of Onset   Thyroid cancer Father        dx unknown age   14 Maternal Uncle        x2 mat uncles; unknown type;  ?pancreatic/prostate; dx after 22   Breast cancer Paternal Aunt        dx after 62   Cancer Paternal Aunt        x2 paternal aunts; unknown type   Lung cancer Maternal Grandmother        dx after 54; smoking hx   Lung cancer Maternal Grandfather        dx after 35; smoking hx    Social History   Socioeconomic History   Marital status: Married    Spouse name: Not on file   Number of children: Not on file   Years of education: Not on file   Highest education level: Not on file  Occupational History   Not on file  Tobacco Use   Smoking status: Former    Types: Cigarettes    Quit date: 2012    Years since quitting: 11.7   Smokeless tobacco: Never  Vaping Use   Vaping Use: Never used  Substance and Sexual Activity   Alcohol use: Not Currently   Drug use: No   Sexual activity: Not on file  Other Topics Concern   Not on file  Social History Narrative   Not on file   Social Determinants of Health   Financial Resource Strain: Low Risk  (03/29/2022)   Overall Financial Resource Strain (CARDIA)    Difficulty of Paying Living Expenses: Not very hard  Food Insecurity: No Food Insecurity (03/29/2022)    Hunger Vital Sign    Worried About Running Out of Food in the Last Year: Never true    Ran Out of Food in the Last Year: Never true  Transportation Needs: No Transportation Needs (03/29/2022)   PRAPARE - Hydrologist (Medical): No    Lack of Transportation (Non-Medical): No  Physical Activity: Inactive (03/29/2022)   Exercise Vital Sign    Days of Exercise per Week: 0 days    Minutes of Exercise per Session: 0 min  Stress: Stress Concern Present (03/29/2022)   [place]    Feeling of Stress : To some extent  Social Connections: Moderately Integrated (03/29/2022)   Social Connection and Isolation Panel [NHANES]    Frequency of Communication with Friends  and Family: Three times a week    Frequency of Social Gatherings with Friends and Family: Twice a week    Attends Religious Services: 1 to 4 times per year    Active Member of [employer] or Organizations: No    Attends Archivist Meetings: Never    Marital Status: Married    Review of Systems: A 12 point ROS discussed and pertinent positives are indicated in the HPI above.  All other systems are negative.  Review of Systems  Constitutional:  Negative for appetite change, chills and fever.  Respiratory:  Negative for shortness of breath.   Cardiovascular:  Positive for leg swelling. Negative for chest pain.  Gastrointestinal:  Positive for abdominal distention and abdominal pain. Negative for nausea and vomiting.  Neurological:  Negative for dizziness and headaches.    Vital Signs: BP 103/66   Pulse (!) 51   Temp (!) 97.2 F (36.2 C) (Temporal)   Resp 16   Ht '5\' 11"'$  (1.803 m)   Wt 156 lb (70.8 kg)   SpO2 99%   BMI 21.76 kg/m     Physical Exam Vitals reviewed.  Constitutional:      General: He is not in acute distress.    Appearance: Normal appearance. He is ill-appearing.  HENT:     Head: Normocephalic and atraumatic.      Mouth/Throat:     Mouth: Mucous membranes are dry.     Pharynx: Oropharynx is clear.  Eyes:     Extraocular Movements: Extraocular movements intact.     Pupils: Pupils are equal, round, and reactive to light.  Cardiovascular:     Rate and Rhythm: Normal rate and regular rhythm.     Pulses: Normal pulses.     Heart sounds: Normal heart sounds. No murmur heard. Pulmonary:     Effort: Pulmonary effort is normal. No respiratory distress.     Breath sounds: Normal breath sounds.  Abdominal:     General: Bowel sounds are normal. There is distension.     Palpations: Abdomen is soft.     Tenderness: There is no abdominal tenderness. There is no guarding.  Musculoskeletal:     Right lower leg: Edema present.     Left lower leg: Edema present.  Skin:    General: Skin is warm and dry.  Neurological:     Mental Status: He is alert and oriented to person, place, and time.  Psychiatric:        Mood and Affect: Mood normal.        Behavior: Behavior normal.        Thought Content: Thought content normal.        Judgment: Judgment normal.     Imaging: CT ABDOMEN PELVIS W CONTRAST  Result Date: 09/09/2022 CLINICAL DATA:  Pancreatic cancer, new ascites status post paracentesis, status post Whipple procedure * Tracking Code: BO * EXAM: CT ABDOMEN AND PELVIS WITH CONTRAST TECHNIQUE: Multidetector CT imaging of the abdomen and pelvis was performed using the standard protocol following bolus administration of intravenous contrast. RADIATION DOSE REDUCTION: This exam was performed according to the departmental dose-optimization program which includes automated exposure control, adjustment of the mA and/or kV according to patient size and/or use of iterative reconstruction technique. CONTRAST:  90m OMNIPAQUE IOHEXOL 300 MG/ML SOLN additional oral enteric contrast COMPARISON:  11/21/2021 FINDINGS: Lower chest: No acute abnormality. Coronary artery calcifications. Trace pericardial effusion.  Hepatobiliary: Multiple new somewhat ill-defined hypoenhancing right lobe liver lesions, for example in the peripheral  inferior right lobe of the liver hepatic segment VI measuring 1.4 x 1.3 cm (series 2, image 49). Status post cholecystectomy and hepatojejunostomy. Pancreas: Status post Whipple pancreaticoduodenectomy. Ill-defined soft tissue at the pancreaticojejunostomy anastomosis with effacement of the portal vein at its confluence (series 2, image 26). Spleen: Normal in size without significant abnormality. Adrenals/Urinary Tract: Adrenal glands are unremarkable. Kidneys are normal, without renal calculi, solid lesion, or hydronephrosis. Bladder is unremarkable. Stomach/Bowel: Status post distal gastrectomy and gastrojejunostomy. Appendix appears normal. No evidence of bowel wall thickening, distention, or inflammatory changes. Vascular/Lymphatic: Aortic atherosclerosis. No enlarged abdominal or pelvic lymph nodes. Reproductive: No mass or other significant abnormality. Other: No abdominal wall hernia. Anasarca. Moderate volume ascites throughout the abdomen and pelvis. Some suggestion of peritoneal thickening without overt nodularity directly visualized. Musculoskeletal: No acute or significant osseous findings. IMPRESSION: 1. Multiple new somewhat ill-defined hypoenhancing right lobe liver lesions, consistent with hepatic metastatic disease. 2. Status post Whipple pancreaticoduodenectomy. Ill-defined soft tissue at the pancreatic anastomosis with effacement of the portal vein at its confluence. Although this may reflect decompressed jejunal limb, this appearance is highly concerning for local recurrence of malignancy given clear evidence of new metastatic disease in the liver and abnormal appearance of the portal vein. 3. Moderate volume ascites throughout the abdomen and pelvis, presumably malignant. Some suggestion of peritoneal thickening without overt nodularity directly visualized. 4. Anasarca. 5.  Coronary artery disease. These results will be called to the ordering clinician or representative by the Radiologist Assistant, and communication documented in the PACS or [employer]. Aortic Atherosclerosis (ICD10-I70.0). Electronically Signed   By: Delanna Ahmadi M.D.   On: 09/09/2022 14:48   US Paracentesis  Result Date: 09/06/2022 INDICATION: Patient with history of pancreatic cancer status post pancreaticoduodenectomy with new onset ascites. Request received for diagnostic and therapeutic paracentesis with a 4 L max. EXAM: ULTRASOUND GUIDED DIAGNOSTIC AND THERAPEUTIC RIGHT LOWER QUADRANT PARACENTESIS MEDICATIONS: 10 mL 1 % lidocaine COMPLICATIONS: None immediate. PROCEDURE: Informed written consent was obtained from the patient after a discussion of the risks, benefits and alternatives to treatment. A timeout was performed prior to the initiation of the procedure. Initial ultrasound scanning demonstrates a moderate amount of ascites within the right lower abdominal quadrant. The right lower abdomen was prepped and draped in the usual sterile fashion. 1% lidocaine was used for local anesthesia. Following this, a 19 gauge, 7-cm, Yueh catheter was introduced. An ultrasound image was saved for documentation purposes. The paracentesis was performed. The catheter was removed and a dressing was applied. The patient tolerated the procedure well without immediate post procedural complication. FINDINGS: A total of approximately 3.6 L of clear, yellow fluid was removed. Samples were sent to the laboratory as requested by the clinical team. IMPRESSION: Successful ultrasound-guided paracentesis yielding 3.6 liters of peritoneal fluid. Read by: Narda Rutherford, AGNP-BC Electronically Signed   By: Jacqulynn Cadet M.D.   On: 09/06/2022 15:41    Labs:  CBC: Recent Labs    06/29/22 0936 07/13/22 0845 08/10/22 0830 09/15/22 1056  WBC 2.5* 1.9* 3.5* 2.3*  HGB 9.7* 8.7* 8.9* 9.5*  HCT 28.1* 25.8* 26.2* 29.1*   PLT 61* 74* 88* 91*    COAGS: Recent Labs    11/14/21 0300 11/15/21 0454 12/22/21 2157 09/15/22 1056  INR 1.1 1.0 1.1 1.1    BMP: Recent Labs    05/20/22 0813 05/30/22 0933 06/10/22 0844 07/13/22 0845 09/08/22 1000  NA 138 137 139 139  --   K 4.0 3.5 3.8 3.9  --  CL 103 107 107 107  --   CO2 '28 26 25 25  '$ --   GLUCOSE 92 152* 98 96  --   BUN '15 16 18 '$ 21*  --   CALCIUM 9.7 9.2 9.3 8.5*  --   CREATININE 0.70 0.86 0.78 0.63 0.50*  GFRNONAA >60 >60 >60 >60  --     LIVER FUNCTION TESTS: Recent Labs    05/20/22 0813 05/30/22 0933 06/10/22 0844 07/13/22 0845  BILITOT 0.5 0.5 0.6 0.4  AST 34 55* 38 38  ALT 45* 48* 35 37  ALKPHOS 225* 239* 212* 231*  PROT 6.1* 6.1* 6.2* 5.9*  ALBUMIN 3.8 3.6 3.8 3.2*    TUMOR MARKERS: No results for input(s): "AFPTM", "CEA", "CA199", "CHROMGRNA" in the last 8760 hours.  Assessment and Plan:  56 yo male presents today for liver lesion biopsy.   Pt resting in bed.  He is A&O, calm and pleasant.  He is in no distress.  Pt is NPO per order. He denies the use of AC/AP medication.  Risks and benefits of liver lesion biopsy and paracentesis was discussed with the patient and/or patient's family including, but not limited to bleeding, infection, damage to adjacent structures or low yield requiring additional tests.  All of the questions were answered and there is agreement to proceed.  Consent signed and in chart.   Thank you for this interesting consult.  I greatly enjoyed meeting Yeiden D Stiefel and look forward to participating in their care.  A copy of this report was sent to the requesting provider on this date.  Electronically Signed: Tyson Alias, NP 09/15/2022, 12:20 PM   I spent a total of 20 minutes in face to face in clinical consultation, greater than 50% of which was counseling/coordinating care for ascites, liver lesion.

## 2022-09-16 LAB — SURGICAL PATHOLOGY

## 2022-09-18 ENCOUNTER — Other Ambulatory Visit: Payer: Self-pay | Admitting: Psychiatry

## 2022-09-19 LAB — CYTOLOGY - NON PAP

## 2022-09-20 NOTE — Telephone Encounter (Signed)
Please schedule appt

## 2022-09-21 ENCOUNTER — Inpatient Hospital Stay: Payer: BC Managed Care – PPO

## 2022-09-21 ENCOUNTER — Inpatient Hospital Stay (HOSPITAL_BASED_OUTPATIENT_CLINIC_OR_DEPARTMENT_OTHER): Payer: BC Managed Care – PPO | Admitting: Oncology

## 2022-09-21 VITALS — BP 112/74 | HR 55 | Temp 98.2°F | Resp 20 | Ht 71.0 in | Wt 154.4 lb

## 2022-09-21 DIAGNOSIS — Z23 Encounter for immunization: Secondary | ICD-10-CM

## 2022-09-21 DIAGNOSIS — C250 Malignant neoplasm of head of pancreas: Secondary | ICD-10-CM | POA: Diagnosis not present

## 2022-09-21 DIAGNOSIS — R188 Other ascites: Secondary | ICD-10-CM | POA: Diagnosis not present

## 2022-09-21 DIAGNOSIS — Z9221 Personal history of antineoplastic chemotherapy: Secondary | ICD-10-CM | POA: Diagnosis not present

## 2022-09-21 DIAGNOSIS — Z86718 Personal history of other venous thrombosis and embolism: Secondary | ICD-10-CM | POA: Diagnosis not present

## 2022-09-21 DIAGNOSIS — F319 Bipolar disorder, unspecified: Secondary | ICD-10-CM | POA: Diagnosis not present

## 2022-09-21 DIAGNOSIS — I4891 Unspecified atrial fibrillation: Secondary | ICD-10-CM | POA: Diagnosis not present

## 2022-09-21 DIAGNOSIS — Z7901 Long term (current) use of anticoagulants: Secondary | ICD-10-CM | POA: Diagnosis not present

## 2022-09-21 DIAGNOSIS — D701 Agranulocytosis secondary to cancer chemotherapy: Secondary | ICD-10-CM | POA: Diagnosis not present

## 2022-09-21 DIAGNOSIS — T451X5A Adverse effect of antineoplastic and immunosuppressive drugs, initial encounter: Secondary | ICD-10-CM | POA: Diagnosis not present

## 2022-09-21 DIAGNOSIS — R140 Abdominal distension (gaseous): Secondary | ICD-10-CM | POA: Diagnosis not present

## 2022-09-21 DIAGNOSIS — Z452 Encounter for adjustment and management of vascular access device: Secondary | ICD-10-CM | POA: Diagnosis not present

## 2022-09-21 LAB — CBC WITH DIFFERENTIAL (CANCER CENTER ONLY)
Abs Immature Granulocytes: 0 10*3/uL (ref 0.00–0.07)
Basophils Absolute: 0 10*3/uL (ref 0.0–0.1)
Basophils Relative: 0 %
Eosinophils Absolute: 0 10*3/uL (ref 0.0–0.5)
Eosinophils Relative: 2 %
HCT: 27.9 % — ABNORMAL LOW (ref 39.0–52.0)
Hemoglobin: 9.2 g/dL — ABNORMAL LOW (ref 13.0–17.0)
Immature Granulocytes: 0 %
Lymphocytes Relative: 20 %
Lymphs Abs: 0.5 10*3/uL — ABNORMAL LOW (ref 0.7–4.0)
MCH: 35.5 pg — ABNORMAL HIGH (ref 26.0–34.0)
MCHC: 33 g/dL (ref 30.0–36.0)
MCV: 107.7 fL — ABNORMAL HIGH (ref 80.0–100.0)
Monocytes Absolute: 0.2 10*3/uL (ref 0.1–1.0)
Monocytes Relative: 10 %
Neutro Abs: 1.6 10*3/uL — ABNORMAL LOW (ref 1.7–7.7)
Neutrophils Relative %: 68 %
Platelet Count: 81 10*3/uL — ABNORMAL LOW (ref 150–400)
RBC: 2.59 MIL/uL — ABNORMAL LOW (ref 4.22–5.81)
RDW: 14.2 % (ref 11.5–15.5)
WBC Count: 2.3 10*3/uL — ABNORMAL LOW (ref 4.0–10.5)
nRBC: 0 % (ref 0.0–0.2)

## 2022-09-21 MED ORDER — OXYCODONE-ACETAMINOPHEN 5-325 MG PO TABS: 1.0000 | ORAL_TABLET | ORAL | 0 refills | Status: DC | PRN

## 2022-09-21 MED ORDER — INFLUENZA VAC SPLIT QUAD 0.5 ML IM SUSY
0.5000 mL | PREFILLED_SYRINGE | Freq: Once | INTRAMUSCULAR | Status: AC
  Administered 2022-09-21: 0.5 mL via INTRAMUSCULAR
  Filled 2022-09-21: qty 0.5

## 2022-09-21 NOTE — Progress Notes (Signed)
[facility] OFFICE PROGRESS NOTE   Diagnosis: Pancreas cancer  INTERVAL HISTORY:   Mr. Burmester returns as scheduled.  He underwent a paracentesis and liver biopsy on 09/15/2022.  4.3 L of fluid were removed.  The liver core biopsy returned as moderate to poorly differentiated adenocarcinoma consistent with metastasis from pancreas cancer.  The peritoneal fluid cytology revealed reactive mesothelial cells.  He has discomfort secondary to abdominal distention.  Tramadol does not help.  Objective:  Vital signs in last 24 hours:  Blood pressure 112/74, pulse (!) 55, temperature 98.2 F (36.8 C), temperature source Oral, resp. rate 20, height $RemoveBe'5\' 11"'PFwRucnmu$  (1.803 m), weight 154 lb 6.4 oz (70 kg), SpO2 100 %.   Resp: Decreased breath sounds at the right lower posterior chest, no respiratory distress Cardio: Regular rate and rhythm GI: No hepatosplenomegaly, distended with ascites Vascular: Trace-1+ pitting edema to lower leg bilaterally   Portacath/PICC-without erythema  Lab Results:  Lab Results  Component Value Date   WBC 2.3 (L) 09/21/2022   HGB 9.2 (L) 09/21/2022   HCT 27.9 (L) 09/21/2022   MCV 107.7 (H) 09/21/2022   PLT 81 (L) 09/21/2022   NEUTROABS 1.6 (L) 09/21/2022    CMP  Lab Results  Component Value Date   NA 139 07/13/2022   K 3.9 07/13/2022   CL 107 07/13/2022   CO2 25 07/13/2022   GLUCOSE 96 07/13/2022   BUN 21 (H) 07/13/2022   CREATININE 0.50 (L) 09/08/2022   CALCIUM 8.5 (L) 07/13/2022   PROT 5.9 (L) 07/13/2022   ALBUMIN 3.2 (L) 07/13/2022   AST 38 07/13/2022   ALT 37 07/13/2022   ALKPHOS 231 (H) 07/13/2022   BILITOT 0.4 07/13/2022   GFRNONAA >60 07/13/2022   GFRAA >60 08/19/2015    Lab Results  Component Value Date   EUM353 44 (H) 07/13/2022      Medications: I have reviewed the patient's current medications.   Assessment/Plan: Pancreas cancer, status post a pancreaticoduodenectomy 11/12/2021, stage IIb (pT1,pN1) 1/11 lymph  nodes, perineural invasion positive, positive margin at the SMA and SMV MRCP 09/27/2021-diffuse intrahepatic and common bile duct dilatation with abrupt termination at the level of the head of the pancreas, indistinct area of hypoenhancement in the pancreas head ERCP with placement of pancreatic duct and bile duct stents 10/04/2021 EUS 1 10/04/2021-no pancreas mass identified, FNA of pancreas head-"atypical "cells, no adenopathy CT abdomen/pelvis 10/18/2021-no focal liver abnormality, intra and extrahepatic biliary ductal dilatation with narrowing of the common duct and the pancreas head, no enlarged abdominal lymph nodes Cycle 1 FOLFIRINOX 12/27/2021, irinotecan held with cycle 1 Cycle 2 FOLFIRINOX 01/24/2022, oxaliplatin and irinotecan dose reduced secondary to elevated liver enzymes, Udenyca Cycle 3 FOLFIRINOX 02/07/2022, same doses as cycle 2 Cycle 4 FOLFIRINOX 02/21/2022 Cycle 5 FOLFIRINOX 03/07/2022 Cycle 6 FOLFIRINOX 03/22/2022 Cycle 7 FOLFIRINOX 04/05/2022 Cycle 8 FOLFIRINOX 04/19/2022 Radiation/Xeloda 05/09/2022-06/16/2022 Paracentesis 09/06/2022-reactive mesothelial cells CTs 09/08/2022-multiple new right lobe liver lesions, ill-defined soft tissue pancreatic anastomosis, moderate volume ascites Ultrasound-guided biopsy of a right liver lesion 09/15/2022-moderate to poorly differentiated adenocarcinoma consistent with pancreas cancer Acute pancreatitis 10/04/2021 Bipolar disorder Left soleal DVT 11/26/2021-apixaban, discontinued 07/14/2022 Port-A-Cath placement 12/22/2021  Atrial fibrillation 12/22/2021, converted to sinus rhythm, discharged home 12/23/2021 on metoprolol 12.5 mg twice daily, on Eliquis for prior DVT. Neutropenia secondary to chemotherapy 01/10/2022, Udenyca added with cycle 2 Elevated liver enzymes, predating chemotherapy     Disposition: Mr Medlin has pancreas cancer.  The liver biopsy confirmed metastatic pancreas cancer.  I discussed the biopsy  finding, prognosis, and  treatment options with Mr. Malveaux and his wife.  He understands no therapy will be curative.  I estimated his expected survival to be months as compared to years.  We discussed treatment options including comfort care and salvage systemic therapy.  He would like to proceed with treatment.  The goals of treatment are to palliate his symptoms and potentially prolong survival.  I recommend gemcitabine/Abraxane as a standard systemic treatment regimen. We reviewed potential toxicities associated with the gemcitabine/Abraxane regimen including the chance of hematologic toxicity, infection, bleeding, and alopecia.  We discussed the fever, rash, and pneumonitis associated with gemcitabine.  We discussed the neuropathy seen with Abraxane.  He agrees to proceed.  He will be leaving for vacation on 09/23/2022.  He will be referred for a palliative therapeutic paracentesis on 09/22/2022.  Cycle 1 gemcitabine/Abraxane will be scheduled for 10/03/2022.  He has mild neutropenia and thrombocytopenia since completing the course of FOLFIRINOX.  The gemcitabine/Abraxane will be dose reduced.  We will submit tissue for next generation sequencing and mismatch repair protein testing.  A chemotherapy plan was entered today.  Betsy Coder, MD  09/21/2022  1:19 PM

## 2022-09-21 NOTE — Progress Notes (Signed)
DISCONTINUE ON PATHWAY REGIMEN - Pancreatic Adenocarcinoma     A cycle is every 14 days:     Oxaliplatin      Leucovorin      Irinotecan      Fluorouracil   **Always confirm dose/schedule in your pharmacy ordering system**  REASON: Disease Progression PRIOR TREATMENT: [id number]: mFOLFIRINOX q14 Days x 4-6 Months TREATMENT RESPONSE: Unable to Evaluate  START ON PATHWAY REGIMEN - Pancreatic Adenocarcinoma     A cycle is every 28 days:     Nab-paclitaxel (protein bound)      Gemcitabine   **Always confirm dose/schedule in your pharmacy ordering system**  Patient Characteristics: Metastatic Disease, First Line, PS = 0,1, BRCA1/2 and PALB2  Mutation Absent/Unknown Therapeutic Status: Metastatic Disease Line of Therapy: First Line ECOG Performance Status: 1 BRCA1/2 Mutation Status: Awaiting Test Results PALB2 Mutation Status: Awaiting Test Results Intent of Therapy: Non-Curative / Palliative Intent, Discussed with Patient

## 2022-09-22 ENCOUNTER — Other Ambulatory Visit: Payer: Self-pay

## 2022-09-22 ENCOUNTER — Ambulatory Visit (HOSPITAL_COMMUNITY): Payer: BC Managed Care – PPO

## 2022-09-22 ENCOUNTER — Inpatient Hospital Stay: Payer: BC Managed Care – PPO | Admitting: Licensed Clinical Social Worker

## 2022-09-22 ENCOUNTER — Ambulatory Visit (HOSPITAL_COMMUNITY)
Admission: RE | Admit: 2022-09-22 | Discharge: 2022-09-22 | Disposition: A | Discharge status: Home / Self Care | Payer: BC Managed Care – PPO | Source: Ambulatory Visit | Attending: Oncology | Admitting: Oncology

## 2022-09-22 DIAGNOSIS — C250 Malignant neoplasm of head of pancreas: Secondary | ICD-10-CM | POA: Diagnosis not present

## 2022-09-22 DIAGNOSIS — C259 Malignant neoplasm of pancreas, unspecified: Secondary | ICD-10-CM | POA: Diagnosis not present

## 2022-09-22 DIAGNOSIS — R188 Other ascites: Secondary | ICD-10-CM | POA: Diagnosis not present

## 2022-09-22 MED ORDER — LIDOCAINE HCL 1 % IJ SOLN
INTRAMUSCULAR | Status: AC
  Administered 2022-09-22: 15 mL
  Filled 2022-09-22: qty 20

## 2022-09-22 NOTE — Procedures (Signed)
PROCEDURE SUMMARY:  Successful US guided paracentesis from right lower quadrant.  Yielded 2.1 L of clear yellow fluid.  No immediate complications.  Pt tolerated well.   Specimen not sent for labs.  EBL < 2 mL  Theresa Duty, NP 09/22/2022 10:15 AM

## 2022-09-22 NOTE — Progress Notes (Signed)
[facility] CSW Counseling Note  Patient's spouse, [name], was referred by nurse. Treatment type: Individual  Presenting Concerns: Patient and/or family reports the following symptoms/concerns: anxiety and depression Duration of problem: 2 days; Severity of problem: moderate   Orientation:oriented to person, place, time/date, situation, day of week, month of year, and year.   Affect: Tearful Risk of harm to self or others: No plan to harm self or others  Patient and/or Family's Strengths/Protective Factors: Social connections, Concrete supports in place (healthy food, safe environments, etc.), and Physical Health (exercise, healthy diet, medication compliance, etc.)Average or above average intelligence  Communication skills  General fund of knowledge  Supportive family/friends      Goals Addressed: Patient will:  Reduce symptoms of: anxiety and depression Increase knowledge and/or ability of: stress reduction  Begin healthy grieving over loss   Progress towards Goals: Initial   Interventions: Interventions utilized:  Strength-based, Supportive, and Family Systems.  Mailed Disability Release of Information form to [employer], along with grant information from the [employer].     Assessment: Spouse currently experiencing adjustment issues related to patient's recent diagnosis.  Christy's understanding is that patient's cancer has metastasized and that his prognosis has lessened.  The couple has been together for forty years.  She is concerned about their future together.      Plan: Follow up with CSW: Wednesday, 10/25, at 1:30pm. Behavioral recommendations: Journal. Referral(s): [facility] [employer] for [employer].     [name] Zebastian Carico, LCSW

## 2022-09-23 LAB — CANCER ANTIGEN 19-9: CA 19-9: 733 U/mL — ABNORMAL HIGH (ref 0–35)

## 2022-09-26 ENCOUNTER — Other Ambulatory Visit: Payer: Self-pay

## 2022-09-28 ENCOUNTER — Telehealth: Payer: Self-pay

## 2022-09-28 ENCOUNTER — Inpatient Hospital Stay: Payer: BC Managed Care – PPO

## 2022-09-28 NOTE — Telephone Encounter (Signed)
CSW attempted to contact patient's spouse, [name], to assess adjustment issues as discussed.  Left vm.

## 2022-09-29 ENCOUNTER — Other Ambulatory Visit: Payer: Self-pay | Admitting: Family Medicine

## 2022-09-29 DIAGNOSIS — C250 Malignant neoplasm of head of pancreas: Secondary | ICD-10-CM | POA: Diagnosis not present

## 2022-09-29 DIAGNOSIS — C259 Malignant neoplasm of pancreas, unspecified: Secondary | ICD-10-CM

## 2022-09-29 NOTE — Telephone Encounter (Signed)
Pt has appt with Dr Clovis Pu now.  He is asking for the Depakote refill to be sent in to [facility].    Next appt 1/17  (Dr. Casimiro Needle first avail)

## 2022-09-30 ENCOUNTER — Other Ambulatory Visit: Payer: Self-pay

## 2022-10-02 ENCOUNTER — Other Ambulatory Visit: Payer: Self-pay | Admitting: Oncology

## 2022-10-04 ENCOUNTER — Inpatient Hospital Stay (HOSPITAL_BASED_OUTPATIENT_CLINIC_OR_DEPARTMENT_OTHER): Payer: BC Managed Care – PPO | Admitting: Nurse Practitioner

## 2022-10-04 ENCOUNTER — Inpatient Hospital Stay: Payer: BC Managed Care – PPO

## 2022-10-04 ENCOUNTER — Encounter (HOSPITAL_COMMUNITY): Payer: Self-pay

## 2022-10-04 ENCOUNTER — Encounter: Payer: Self-pay | Admitting: Nurse Practitioner

## 2022-10-04 VITALS — BP 108/63 | HR 58 | Temp 98.2°F | Resp 18 | Ht 71.0 in | Wt 154.2 lb

## 2022-10-04 VITALS — BP 107/75 | HR 59

## 2022-10-04 DIAGNOSIS — Z9221 Personal history of antineoplastic chemotherapy: Secondary | ICD-10-CM | POA: Diagnosis not present

## 2022-10-04 DIAGNOSIS — C250 Malignant neoplasm of head of pancreas: Secondary | ICD-10-CM | POA: Diagnosis not present

## 2022-10-04 DIAGNOSIS — T451X5A Adverse effect of antineoplastic and immunosuppressive drugs, initial encounter: Secondary | ICD-10-CM | POA: Diagnosis not present

## 2022-10-04 DIAGNOSIS — R188 Other ascites: Secondary | ICD-10-CM | POA: Diagnosis not present

## 2022-10-04 DIAGNOSIS — Z7901 Long term (current) use of anticoagulants: Secondary | ICD-10-CM | POA: Diagnosis not present

## 2022-10-04 DIAGNOSIS — D701 Agranulocytosis secondary to cancer chemotherapy: Secondary | ICD-10-CM | POA: Diagnosis not present

## 2022-10-04 DIAGNOSIS — Z86718 Personal history of other venous thrombosis and embolism: Secondary | ICD-10-CM | POA: Diagnosis not present

## 2022-10-04 DIAGNOSIS — F319 Bipolar disorder, unspecified: Secondary | ICD-10-CM | POA: Diagnosis not present

## 2022-10-04 DIAGNOSIS — R140 Abdominal distension (gaseous): Secondary | ICD-10-CM | POA: Diagnosis not present

## 2022-10-04 DIAGNOSIS — I4891 Unspecified atrial fibrillation: Secondary | ICD-10-CM | POA: Diagnosis not present

## 2022-10-04 DIAGNOSIS — Z23 Encounter for immunization: Secondary | ICD-10-CM | POA: Diagnosis not present

## 2022-10-04 DIAGNOSIS — Z452 Encounter for adjustment and management of vascular access device: Secondary | ICD-10-CM | POA: Diagnosis not present

## 2022-10-04 LAB — CBC WITH DIFFERENTIAL (CANCER CENTER ONLY)
Abs Immature Granulocytes: 0.01 10*3/uL (ref 0.00–0.07)
Basophils Absolute: 0 10*3/uL (ref 0.0–0.1)
Basophils Relative: 0 %
Eosinophils Absolute: 0.1 10*3/uL (ref 0.0–0.5)
Eosinophils Relative: 3 %
HCT: 26.2 % — ABNORMAL LOW (ref 39.0–52.0)
Hemoglobin: 8.7 g/dL — ABNORMAL LOW (ref 13.0–17.0)
Immature Granulocytes: 0 %
Lymphocytes Relative: 14 %
Lymphs Abs: 0.4 10*3/uL — ABNORMAL LOW (ref 0.7–4.0)
MCH: 36.6 pg — ABNORMAL HIGH (ref 26.0–34.0)
MCHC: 33.2 g/dL (ref 30.0–36.0)
MCV: 110.1 fL — ABNORMAL HIGH (ref 80.0–100.0)
Monocytes Absolute: 0.3 10*3/uL (ref 0.1–1.0)
Monocytes Relative: 8 %
Neutro Abs: 2.3 10*3/uL (ref 1.7–7.7)
Neutrophils Relative %: 75 %
Platelet Count: 86 10*3/uL — ABNORMAL LOW (ref 150–400)
RBC: 2.38 MIL/uL — ABNORMAL LOW (ref 4.22–5.81)
RDW: 15.2 % (ref 11.5–15.5)
WBC Count: 3.1 10*3/uL — ABNORMAL LOW (ref 4.0–10.5)
nRBC: 0 % (ref 0.0–0.2)

## 2022-10-04 LAB — CMP (CANCER CENTER ONLY)
ALT: 41 U/L (ref 0–44)
AST: 42 U/L — ABNORMAL HIGH (ref 15–41)
Albumin: 3.1 g/dL — ABNORMAL LOW (ref 3.5–5.0)
Alkaline Phosphatase: 209 U/L — ABNORMAL HIGH (ref 38–126)
Anion gap: 6 (ref 5–15)
BUN: 15 mg/dL (ref 6–20)
CO2: 28 mmol/L (ref 22–32)
Calcium: 8.9 mg/dL (ref 8.9–10.3)
Chloride: 104 mmol/L (ref 98–111)
Creatinine: 0.61 mg/dL (ref 0.61–1.24)
GFR, Estimated: >60 mL/min (ref >60)
Glucose, Bld: 92 mg/dL (ref 70–99)
Potassium: 3.9 mmol/L (ref 3.5–5.1)
Sodium: 138 mmol/L (ref 135–145)
Total Bilirubin: 0.5 mg/dL (ref 0.3–1.2)
Total Protein: 6.3 g/dL — ABNORMAL LOW (ref 6.5–8.1)

## 2022-10-04 MED ORDER — HEPARIN SOD (PORK) LOCK FLUSH 100 UNIT/ML IV SOLN: 500.0000 [IU] | Freq: Once | INTRAVENOUS | Status: DC | PRN

## 2022-10-04 MED ORDER — SODIUM CHLORIDE 0.9 % IV SOLN
700.0000 mg/m2 | Freq: Once | INTRAVENOUS | Status: AC
  Administered 2022-10-04: 1292 mg via INTRAVENOUS
  Filled 2022-10-04: qty 26.3

## 2022-10-04 MED ORDER — SODIUM CHLORIDE 0.9 % IV SOLN: Freq: Once | INTRAVENOUS | Status: AC

## 2022-10-04 MED ORDER — PROCHLORPERAZINE MALEATE 10 MG PO TABS
10.0000 mg | ORAL_TABLET | Freq: Once | ORAL | Status: AC
  Administered 2022-10-04: 10 mg via ORAL
  Filled 2022-10-04: qty 1

## 2022-10-04 MED ORDER — SODIUM CHLORIDE 0.9% FLUSH: 10.0000 mL | INTRAVENOUS | Status: DC | PRN

## 2022-10-04 MED ORDER — PACLITAXEL PROTEIN-BOUND CHEMO INJECTION 100 MG
75.0000 mg/m2 | Freq: Once | INTRAVENOUS | Status: AC
  Administered 2022-10-04: 150 mg via INTRAVENOUS
  Filled 2022-10-04: qty 30

## 2022-10-04 NOTE — Patient Instructions (Addendum)
[facility]  Discharge Instructions: Thank you for choosing [facility] to provide your oncology and hematology care.   If you have a lab appointment with the [facility], please go directly to the [facility] and check in at the registration area.   Wear comfortable clothing and clothing appropriate for easy access to any Portacath or PICC line.   We strive to give you quality time with your provider. You may need to reschedule your appointment if you arrive late (15 or more minutes).  Arriving late affects you and other patients whose appointments are after yours.  Also, if you miss three or more appointments without notifying the office, you may be dismissed from the clinic at the provider's discretion.      For prescription refill requests, have your pharmacy contact our office and allow 72 hours for refills to be completed.    Today you received the following chemotherapy and/or immunotherapy agents: paclitaxel protein bound, gemcitabine      To help prevent nausea and vomiting after your treatment, we encourage you to take your nausea medication as directed.  BELOW ARE SYMPTOMS THAT SHOULD BE REPORTED IMMEDIATELY: *FEVER GREATER THAN 100.4 F (38 C) OR HIGHER *CHILLS OR SWEATING *NAUSEA AND VOMITING THAT IS NOT CONTROLLED WITH YOUR NAUSEA MEDICATION *UNUSUAL SHORTNESS OF BREATH *UNUSUAL BRUISING OR BLEEDING *URINARY PROBLEMS (pain or burning when urinating, or frequent urination) *BOWEL PROBLEMS (unusual diarrhea, constipation, pain near the anus) TENDERNESS IN MOUTH AND THROAT WITH OR WITHOUT PRESENCE OF ULCERS (sore throat, sores in mouth, or a toothache) UNUSUAL RASH, SWELLING OR PAIN  UNUSUAL VAGINAL DISCHARGE OR ITCHING   Items with * indicate a potential emergency and should be followed up as soon as possible or go to the Emergency Department if any problems should occur.  Please show the CHEMOTHERAPY ALERT CARD or IMMUNOTHERAPY  ALERT CARD at check-in to the Emergency Department and triage nurse.  Should you have questions after your visit or need to cancel or reschedule your appointment, please contact [facility]  Dept: [phone]  and follow the prompts.  Office hours are 8:00 a.m. to 4:30 p.m. Monday - Friday. Please note that voicemails left after 4:00 p.m. may not be returned until the following business day.  We are closed weekends and major holidays. You have access to a nurse at all times for urgent questions. Please call the main number to the clinic Dept: [phone] and follow the prompts.   For any non-urgent questions, you may also contact your provider using MyChart. We now offer e-Visits for anyone 91 and older to request care online for non-urgent symptoms. For details visit [email or web address].   Also download the MyChart app! Go to the app store, search "MyChart", open the app, select [facility], and log in with your MyChart username and password.  Masks are optional in the cancer centers. If you would like for your care team to wear a mask while they are taking care of you, please let them know. You may have one support person who is at least 56 years old accompany you for your appointments.  Paclitaxel Nanoparticle Albumin-Bound Injection What is this medication? NANOPARTICLE ALBUMIN-BOUND PACLITAXEL (Na no PAHR ti kuhl al BYOO muhn-bound PAK li TAX el) treats some types of cancer. It works by slowing down the growth of cancer cells. This medicine may be used for other purposes; ask your health care provider or pharmacist if you have questions. COMMON  BRAND NAME(S): Abraxane What should I tell my care team before I take this medication? They need to know if you have any of these conditions: Liver disease Low white blood cell levels An unusual or allergic reaction to paclitaxel, albumin, other medications, foods, dyes, or preservatives If you or your partner are  pregnant or trying to get pregnant Breast-feeding How should I use this medication? This medication is injected into a vein. It is given by your care team in a hospital or clinic setting. Talk to your care team about the use of this medication in children. Special care may be needed. Overdosage: If you think you have taken too much of this medicine contact a poison control center or emergency room at once. NOTE: This medicine is only for you. Do not share this medicine with others. What if I miss a dose? Keep appointments for follow-up doses. It is important not to miss your dose. Call your care team if you are unable to keep an appointment. What may interact with this medication? Other medications may affect the way this medication works. Talk with your care team about all of the medications you take. They may suggest changes to your treatment plan to lower the risk of side effects and to make sure your medications work as intended. This list may not describe all possible interactions. Give your health care provider a list of all the medicines, herbs, non-prescription drugs, or dietary supplements you use. Also tell them if you smoke, drink alcohol, or use illegal drugs. Some items may interact with your medicine. What should I watch for while using this medication? Your condition will be monitored carefully while you are receiving this medication. You may need blood work while taking this medication. This medication may make you feel generally unwell. This is not uncommon as chemotherapy can affect healthy cells as well as cancer cells. Report any side effects. Continue your course of treatment even though you feel ill unless your care team tells you to stop. This medication can cause serious allergic reactions. To reduce the risk, your care team may give you other medications to take before receiving this one. Be sure to follow the directions from your care team. This medication may increase your  risk of getting an infection. Call your care team for advice if you get a fever, chills, sore throat, or other symptoms of a cold or flu. Do not treat yourself. Try to avoid being around people who are sick. This medication may increase your risk to bruise or bleed. Call your care team if you notice any unusual bleeding. Be careful brushing or flossing your teeth or using a toothpick because you may get an infection or bleed more easily. If you have any dental work done, tell your dentist you are receiving this medication. Talk to your care team if you or your partner may be pregnant. Serious birth defects can occur if you take this medication during pregnancy and for 6 months after the last dose. You will need a negative pregnancy test before starting this medication. Contraception is recommended while taking this medication and for 6 months after the last dose. Your care team can help you find the option that works for you. If your partner can get pregnant, use a condom during sex while taking this medication and for 3 months after the last dose. Do not breastfeed while taking this medication and for 2 weeks after the last dose. This medication may cause infertility. Talk to your care  team if you are concerned about your fertility. What side effects may I notice from receiving this medication? Side effects that you should report to your care team as soon as possible: Allergic reactions--skin rash, itching, hives, swelling of the face, lips, tongue, or throat Dry cough, shortness of breath or trouble breathing Infection--fever, chills, cough, sore throat, wounds that don't heal, pain or trouble when passing urine, general feeling of discomfort or being unwell Low red blood cell level--unusual weakness or fatigue, dizziness, headache, trouble breathing Pain, tingling, or numbness in the hands or feet Stomach pain, unusual weakness or fatigue, nausea, vomiting, diarrhea, or fever that lasts longer than  expected Unusual bruising or bleeding Side effects that usually do not require medical attention (report to your care team if they continue or are bothersome): Diarrhea Fatigue Hair loss Loss of appetite Nausea Vomiting This list may not describe all possible side effects. Call your doctor for medical advice about side effects. You may report side effects to FDA at 1-800-FDA-1088. Where should I keep my medication? This medication is given in a hospital or clinic. It will not be stored at home. NOTE: This sheet is a summary. It may not cover all possible information. If you have questions about this medicine, talk to your doctor, pharmacist, or health care provider.  2023 Elsevier/Gold Standard (2008-01-12 00:00:00)  Gemcitabine Injection What is this medication? GEMCITABINE (jem SYE ta been) treats some types of cancer. It works by slowing down the growth of cancer cells. This medicine may be used for other purposes; ask your health care provider or pharmacist if you have questions. COMMON BRAND NAME(S): Gemzar, Infugem What should I tell my care team before I take this medication? They need to know if you have any of these conditions: Blood disorders Infection Kidney disease Liver disease Lung or breathing disease, such as asthma or COPD Recent or ongoing radiation therapy An unusual or allergic reaction to gemcitabine, other medications, foods, dyes, or preservatives If you or your partner are pregnant or trying to get pregnant Breast-feeding How should I use this medication? This medication is injected into a vein. It is given by your care team in a hospital or clinic setting. Talk to your care team about the use of this medication in children. Special care may be needed. Overdosage: If you think you have taken too much of this medicine contact a poison control center or emergency room at once. NOTE: This medicine is only for you. Do not share this medicine with others. What  if I miss a dose? Keep appointments for follow-up doses. It is important not to miss your dose. Call your care team if you are unable to keep an appointment. What may interact with this medication? Interactions have not been studied. This list may not describe all possible interactions. Give your health care provider a list of all the medicines, herbs, non-prescription drugs, or dietary supplements you use. Also tell them if you smoke, drink alcohol, or use illegal drugs. Some items may interact with your medicine. What should I watch for while using this medication? Your condition will be monitored carefully while you are receiving this medication. This medication may make you feel generally unwell. This is not uncommon, as chemotherapy can affect healthy cells as well as cancer cells. Report any side effects. Continue your course of treatment even though you feel ill unless your care team tells you to stop. In some cases, you may be given additional medications to help with side effects.  Follow all directions for their use. This medication may increase your risk of getting an infection. Call your care team for advice if you get a fever, chills, sore throat, or other symptoms of a cold or flu. Do not treat yourself. Try to avoid being around people who are sick. This medication may increase your risk to bruise or bleed. Call your care team if you notice any unusual bleeding. Be careful brushing or flossing your teeth or using a toothpick because you may get an infection or bleed more easily. If you have any dental work done, tell your dentist you are receiving this medication. Avoid taking medications that contain aspirin, acetaminophen, ibuprofen, naproxen, or ketoprofen unless instructed by your care team. These medications may hide a fever. Talk to your care team if you or your partner wish to become pregnant or think you might be pregnant. This medication can cause serious birth defects if taken  during pregnancy and for 6 months after the last dose. A negative pregnancy test is required before starting this medication. A reliable form of contraception is recommended while taking this medication and for 6 months after the last dose. Talk to your care team about effective forms of contraception. Do not father a child while taking this medication and for 3 months after the last dose. Use a condom while having sex during this time period. Do not breastfeed while taking this medication and for at least 1 week after the last dose. This medication may cause infertility. Talk to your care team if you are concerned about your fertility. What side effects may I notice from receiving this medication? Side effects that you should report to your care team as soon as possible: Allergic reactions--skin rash, itching, hives, swelling of the face, lips, tongue, or throat Capillary leak syndrome--stomach or muscle pain, unusual weakness or fatigue, feeling faint or lightheaded, decrease in the amount of urine, swelling of the ankles, hands, or feet, trouble breathing Infection--fever, chills, cough, sore throat, wounds that don't heal, pain or trouble when passing urine, general feeling of discomfort or being unwell Liver injury--right upper belly pain, loss of appetite, nausea, light-colored stool, dark yellow or brown urine, yellowing skin or eyes, unusual weakness or fatigue Low red blood cell level--unusual weakness or fatigue, dizziness, headache, trouble breathing Lung injury--shortness of breath or trouble breathing, cough, spitting up blood, chest pain, fever Stomach pain, bloody diarrhea, pale skin, unusual weakness or fatigue, decrease in the amount of urine, which may be signs of hemolytic uremic syndrome Sudden and severe headache, confusion, change in vision, seizures, which may be signs of posterior reversible encephalopathy syndrome (PRES) Unusual bruising or bleeding Side effects that usually do  not require medical attention (report to your care team if they continue or are bothersome): Diarrhea Drowsiness Hair loss Nausea Pain, redness, or swelling with sores inside the mouth or throat Vomiting This list may not describe all possible side effects. Call your doctor for medical advice about side effects. You may report side effects to FDA at 1-800-FDA-1088. Where should I keep my medication? This medication is given in a hospital or clinic. It will not be stored at home. NOTE: This sheet is a summary. It may not cover all possible information. If you have questions about this medicine, talk to your doctor, pharmacist, or health care provider.  2023 Elsevier/Gold Standard (2008-01-12 00:00:00)

## 2022-10-04 NOTE — Progress Notes (Signed)
Patient seen by Ned Card NP today  Vitals are within treatment parameters.  Labs reviewed by Ned Card NP and are not all within treatment parameters. Platelets 86 K/uL today, per Ned Card, NP, ok to treat.   Per physician team, patient is ready for treatment and there are NO modifications to the treatment plan.

## 2022-10-04 NOTE — Progress Notes (Signed)
  [facility] OFFICE PROGRESS NOTE   Diagnosis: Pancreas cancer  INTERVAL HISTORY:   Dean [name] returns as scheduled.  He is seen prior to the first cycle of gemcitabine/Abraxane.  He continues to note abdominal distention.  He does not feel he needs a paracentesis at this time.  He has occasional sharp pain at the lower abdomen.  He has pain medication at home if needed.  Bowels are moving.  No nausea.  Objective:  Vital signs in last 24 hours:  Blood pressure 108/63, pulse (!) 58, temperature 98.2 F (36.8 C), temperature source Oral, resp. rate 18, height 5' 11" (1.803 m), weight 154 lb 3.2 oz (69.9 kg), SpO2 100 %.    HEENT: No thrush or ulcers. Resp: Lungs are clear, breath sounds diminished at the bases.  No respiratory distress. Cardio: Regular rate and rhythm. GI: Abdomen is distended, soft, consistent with ascites. Vascular: Pitting edema lower leg bilaterally. Port-A-Cath without erythema.  Lab Results:  Lab Results  Component Value Date   WBC 3.1 (L) 10/04/2022   HGB 8.7 (L) 10/04/2022   HCT 26.2 (L) 10/04/2022   MCV 110.1 (H) 10/04/2022   PLT 86 (L) 10/04/2022   NEUTROABS 2.3 10/04/2022    Imaging:  No results found.  Medications: I have reviewed the patient's current medications.  Assessment/Plan: Pancreas cancer, status post a pancreaticoduodenectomy 11/12/2021, stage IIb (pT1,pN1) 1/11 lymph nodes, perineural invasion positive, positive margin at the SMA and SMV MRCP 09/27/2021-diffuse intrahepatic and common bile duct dilatation with abrupt termination at the level of the head of the pancreas, indistinct area of hypoenhancement in the pancreas head ERCP with placement of pancreatic duct and bile duct stents 10/04/2021 EUS 1 10/04/2021-no pancreas mass identified, FNA of pancreas head-"atypical "cells, no adenopathy CT abdomen/pelvis 10/18/2021-no focal liver abnormality, intra and extrahepatic biliary ductal dilatation with narrowing  of the common duct and the pancreas head, no enlarged abdominal lymph nodes Cycle 1 FOLFIRINOX 12/27/2021, irinotecan held with cycle 1 Cycle 2 FOLFIRINOX 01/24/2022, oxaliplatin and irinotecan dose reduced secondary to elevated liver enzymes, Udenyca Cycle 3 FOLFIRINOX 02/07/2022, same doses as cycle 2 Cycle 4 FOLFIRINOX 02/21/2022 Cycle 5 FOLFIRINOX 03/07/2022 Cycle 6 FOLFIRINOX 03/22/2022 Cycle 7 FOLFIRINOX 04/05/2022 Cycle 8 FOLFIRINOX 04/19/2022 Radiation/Xeloda 05/09/2022-06/16/2022 Paracentesis 09/06/2022-reactive mesothelial cells CTs 09/08/2022-multiple new right lobe liver lesions, ill-defined soft tissue pancreatic anastomosis, moderate volume ascites Ultrasound-guided biopsy of a right liver lesion 09/15/2022-moderate to poorly differentiated adenocarcinoma consistent with pancreas cancer; foundation 1-microsatellite stable, tumor mutation burden 2, K-ras G12D Acute pancreatitis 10/04/2021 Bipolar disorder Left soleal DVT 11/26/2021-apixaban, discontinued 07/14/2022 Port-A-Cath placement 12/22/2021  Atrial fibrillation 12/22/2021, converted to sinus rhythm, discharged home 12/23/2021 on metoprolol 12.5 mg twice daily, on Eliquis for prior DVT. Neutropenia secondary to chemotherapy 01/10/2022, Udenyca added with cycle 2 Elevated liver enzymes, predating chemotherapy     Disposition: Dean [name] appears unchanged.  He is scheduled to begin chemotherapy today with gemcitabine/Abraxane.  We again reviewed potential toxicities.  He agrees to proceed.  Chemotherapy doses have been adjusted due to persistent mild neutropenia and thrombocytopenia since completing FOLFIRINOX.  Plan to proceed with cycle 1 gemcitabine/Abraxane today as scheduled.  CBC and chemistry panel reviewed.  Labs adequate to proceed as above.  He will return for follow-up and cycle 2 chemotherapy in 2 weeks.  He will contact the office in the interim with any problems.    Ned Card ANP/GNP-BC   10/04/2022  10:44  AM

## 2022-10-04 NOTE — Patient Instructions (Signed)

## 2022-10-05 ENCOUNTER — Encounter: Payer: Self-pay | Admitting: Oncology

## 2022-10-05 ENCOUNTER — Inpatient Hospital Stay: Payer: BC Managed Care – PPO | Attending: Oncology | Admitting: Licensed Clinical Social Worker

## 2022-10-05 DIAGNOSIS — D709 Neutropenia, unspecified: Secondary | ICD-10-CM | POA: Insufficient documentation

## 2022-10-05 DIAGNOSIS — C250 Malignant neoplasm of head of pancreas: Secondary | ICD-10-CM | POA: Insufficient documentation

## 2022-10-05 DIAGNOSIS — Z5111 Encounter for antineoplastic chemotherapy: Secondary | ICD-10-CM | POA: Insufficient documentation

## 2022-10-05 DIAGNOSIS — I4891 Unspecified atrial fibrillation: Secondary | ICD-10-CM | POA: Insufficient documentation

## 2022-10-05 DIAGNOSIS — R948 Abnormal results of function studies of other organs and systems: Secondary | ICD-10-CM | POA: Insufficient documentation

## 2022-10-05 DIAGNOSIS — Z5189 Encounter for other specified aftercare: Secondary | ICD-10-CM | POA: Insufficient documentation

## 2022-10-05 DIAGNOSIS — D696 Thrombocytopenia, unspecified: Secondary | ICD-10-CM | POA: Insufficient documentation

## 2022-10-05 DIAGNOSIS — Z86718 Personal history of other venous thrombosis and embolism: Secondary | ICD-10-CM | POA: Insufficient documentation

## 2022-10-05 DIAGNOSIS — C787 Secondary malignant neoplasm of liver and intrahepatic bile duct: Secondary | ICD-10-CM | POA: Insufficient documentation

## 2022-10-05 LAB — CANCER ANTIGEN 19-9: CA 19-9: 1363 U/mL — ABNORMAL HIGH (ref 0–35)

## 2022-10-05 NOTE — Progress Notes (Signed)
[facility] CSW Progress Note  Clinical Education officer, museum contacted caregiver by phone to assess questions regarding disability.  Dean [name], patient's spouse, received disability paperwork from [employer] and will return completed forms.  They are concerned about their financial future and taking appropriate actions to get their questions answered.  Christy expressed processing her anger and dealing with her husband's diagnosis.  She said he is eating and feels good.  CSW provided active listening and supportive counseling.  [name] Dailin Sosnowski, LCSW

## 2022-10-06 ENCOUNTER — Other Ambulatory Visit: Payer: Self-pay | Admitting: *Deleted

## 2022-10-06 ENCOUNTER — Encounter: Payer: Self-pay | Admitting: Nurse Practitioner

## 2022-10-06 ENCOUNTER — Telehealth: Payer: Self-pay

## 2022-10-06 ENCOUNTER — Encounter: Payer: Self-pay | Admitting: *Deleted

## 2022-10-06 ENCOUNTER — Other Ambulatory Visit: Payer: Self-pay

## 2022-10-06 ENCOUNTER — Inpatient Hospital Stay: Payer: BC Managed Care – PPO

## 2022-10-06 DIAGNOSIS — C250 Malignant neoplasm of head of pancreas: Secondary | ICD-10-CM

## 2022-10-06 DIAGNOSIS — Z95828 Presence of other vascular implants and grafts: Secondary | ICD-10-CM

## 2022-10-06 NOTE — Telephone Encounter (Signed)
Called and spoke to patient regarding first time chemo follow-up.  Patient reports having no side effects or issues, denied nausea and stated he was able to eat and drink fluids.  Patient instructed to contact office if any questions or concerns came up.  Patient verbalized understanding.  All questions were answered during phone call.

## 2022-10-06 NOTE — Progress Notes (Signed)
[facility] CSW Progress Note  Holiday representative returned call from patient's spouse, [name].  Patient spoke with his human resources department regarding disability.  [name] is concerned about making the correct financial choices for their future.  She said she spoke with a lawyer and did not feel she received accurate information.  She then spoke with someone at the [facility] and felt confident that moving forward with the disability application through them is the best route.  [name] reported feeling sad today because of having these conversations.  She has good support through her coworkers.  CSW provided active listening and supportive counseling.  She had no other needs.    Dean Pickle Cary Wilford, LCSW

## 2022-10-06 NOTE — Progress Notes (Signed)
Patient requesting paracentesis. Able to schedule for [facility] on 11/3

## 2022-10-07 ENCOUNTER — Ambulatory Visit (HOSPITAL_COMMUNITY)
Admission: RE | Admit: 2022-10-07 | Discharge: 2022-10-07 | Disposition: A | Discharge status: Home / Self Care | Payer: BC Managed Care – PPO | Source: Ambulatory Visit | Attending: Oncology | Admitting: Oncology

## 2022-10-07 ENCOUNTER — Encounter: Payer: Self-pay | Admitting: Nurse Practitioner

## 2022-10-07 ENCOUNTER — Inpatient Hospital Stay: Payer: BC Managed Care – PPO

## 2022-10-07 ENCOUNTER — Telehealth: Payer: Self-pay | Admitting: Nurse Practitioner

## 2022-10-07 DIAGNOSIS — C250 Malignant neoplasm of head of pancreas: Secondary | ICD-10-CM | POA: Diagnosis not present

## 2022-10-07 DIAGNOSIS — R180 Malignant ascites: Secondary | ICD-10-CM | POA: Diagnosis not present

## 2022-10-07 DIAGNOSIS — C259 Malignant neoplasm of pancreas, unspecified: Secondary | ICD-10-CM | POA: Diagnosis not present

## 2022-10-07 MED ORDER — LIDOCAINE HCL 1 % IJ SOLN
INTRAMUSCULAR | Status: AC
  Administered 2022-10-07: 15 mL
  Filled 2022-10-07: qty 20

## 2022-10-07 NOTE — Telephone Encounter (Signed)
At Mr. Druck's request through a MyChart message I contacted his wife to discuss foundation 1 results.  We reviewed that he is not a candidate for immunotherapy.  We discussed that there are no findings that would adjust/change his current treatment.  She understands the current recommendation is to continue with gemcitabine/Abraxane on a 2-week schedule.  We discussed the recent CA 19-9 result.  He will follow-up as scheduled.

## 2022-10-07 NOTE — Progress Notes (Signed)
[facility] CSW Progress Note  Holiday representative met with patient to obtain disability release of informationform.  CSW completed the disability application and securely emailed both to the [facility].  Patient stated he had no other needs.    [name] Juwon Scripter, LCSW

## 2022-10-07 NOTE — Procedures (Signed)
PROCEDURE SUMMARY:  Successful US guided paracentesis from right lateral abdomen.  Yielded 1.6 liters of clear yellow fluid.  No immediate complications.  Patient tolerated well.  EBL = trace  Ramzi Brathwaite S Janiel Derhammer PA-C 10/07/2022 2:37 PM

## 2022-10-10 DIAGNOSIS — R946 Abnormal results of thyroid function studies: Secondary | ICD-10-CM | POA: Diagnosis not present

## 2022-10-10 DIAGNOSIS — E039 Hypothyroidism, unspecified: Secondary | ICD-10-CM | POA: Diagnosis not present

## 2022-10-11 ENCOUNTER — Inpatient Hospital Stay: Admission: RE | Admit: 2022-10-11 | Payer: BC Managed Care – PPO | Source: Ambulatory Visit

## 2022-10-13 ENCOUNTER — Other Ambulatory Visit: Payer: Self-pay | Admitting: Oncology

## 2022-10-13 ENCOUNTER — Other Ambulatory Visit: Payer: Self-pay | Admitting: Nurse Practitioner

## 2022-10-13 DIAGNOSIS — C250 Malignant neoplasm of head of pancreas: Secondary | ICD-10-CM

## 2022-10-13 MED ORDER — OXYCODONE-ACETAMINOPHEN 5-325 MG PO TABS: 1.0000 | ORAL_TABLET | ORAL | 0 refills | Status: DC | PRN

## 2022-10-15 ENCOUNTER — Other Ambulatory Visit: Payer: Self-pay | Admitting: Psychiatry

## 2022-10-18 ENCOUNTER — Inpatient Hospital Stay: Payer: BC Managed Care – PPO

## 2022-10-18 ENCOUNTER — Other Ambulatory Visit: Payer: BC Managed Care – PPO | Admitting: Licensed Clinical Social Worker

## 2022-10-18 ENCOUNTER — Encounter: Payer: Self-pay | Admitting: *Deleted

## 2022-10-18 ENCOUNTER — Other Ambulatory Visit: Payer: Self-pay | Admitting: *Deleted

## 2022-10-18 ENCOUNTER — Inpatient Hospital Stay (HOSPITAL_BASED_OUTPATIENT_CLINIC_OR_DEPARTMENT_OTHER): Payer: BC Managed Care – PPO | Admitting: Oncology

## 2022-10-18 ENCOUNTER — Inpatient Hospital Stay: Payer: BC Managed Care – PPO | Admitting: Licensed Clinical Social Worker

## 2022-10-18 ENCOUNTER — Other Ambulatory Visit: Payer: Self-pay | Admitting: Pharmacist

## 2022-10-18 VITALS — BP 108/70 | HR 64 | Temp 98.1°F | Resp 20 | Ht 71.0 in | Wt 158.0 lb

## 2022-10-18 DIAGNOSIS — C250 Malignant neoplasm of head of pancreas: Secondary | ICD-10-CM

## 2022-10-18 DIAGNOSIS — I4891 Unspecified atrial fibrillation: Secondary | ICD-10-CM | POA: Diagnosis not present

## 2022-10-18 DIAGNOSIS — Z95828 Presence of other vascular implants and grafts: Secondary | ICD-10-CM

## 2022-10-18 DIAGNOSIS — R948 Abnormal results of function studies of other organs and systems: Secondary | ICD-10-CM | POA: Diagnosis not present

## 2022-10-18 DIAGNOSIS — Z5111 Encounter for antineoplastic chemotherapy: Secondary | ICD-10-CM | POA: Diagnosis not present

## 2022-10-18 DIAGNOSIS — Z86718 Personal history of other venous thrombosis and embolism: Secondary | ICD-10-CM | POA: Diagnosis not present

## 2022-10-18 DIAGNOSIS — Z5189 Encounter for other specified aftercare: Secondary | ICD-10-CM | POA: Diagnosis not present

## 2022-10-18 DIAGNOSIS — C787 Secondary malignant neoplasm of liver and intrahepatic bile duct: Secondary | ICD-10-CM | POA: Diagnosis not present

## 2022-10-18 DIAGNOSIS — D709 Neutropenia, unspecified: Secondary | ICD-10-CM | POA: Diagnosis not present

## 2022-10-18 DIAGNOSIS — D696 Thrombocytopenia, unspecified: Secondary | ICD-10-CM | POA: Diagnosis not present

## 2022-10-18 LAB — CBC WITH DIFFERENTIAL (CANCER CENTER ONLY)
Abs Immature Granulocytes: 0 10*3/uL (ref 0.00–0.07)
Basophils Absolute: 0 10*3/uL (ref 0.0–0.1)
Basophils Relative: 1 %
Eosinophils Absolute: 0 10*3/uL (ref 0.0–0.5)
Eosinophils Relative: 2 %
HCT: 23.7 % — ABNORMAL LOW (ref 39.0–52.0)
Hemoglobin: 7.8 g/dL — ABNORMAL LOW (ref 13.0–17.0)
Immature Granulocytes: 0 %
Lymphocytes Relative: 24 %
Lymphs Abs: 0.5 10*3/uL — ABNORMAL LOW (ref 0.7–4.0)
MCH: 36.4 pg — ABNORMAL HIGH (ref 26.0–34.0)
MCHC: 32.9 g/dL (ref 30.0–36.0)
MCV: 110.7 fL — ABNORMAL HIGH (ref 80.0–100.0)
Monocytes Absolute: 0.2 10*3/uL (ref 0.1–1.0)
Monocytes Relative: 12 %
Neutro Abs: 1.2 10*3/uL — ABNORMAL LOW (ref 1.7–7.7)
Neutrophils Relative %: 61 %
Platelet Count: 66 10*3/uL — ABNORMAL LOW (ref 150–400)
RBC: 2.14 MIL/uL — ABNORMAL LOW (ref 4.22–5.81)
RDW: 15.3 % (ref 11.5–15.5)
WBC Count: 1.9 10*3/uL — ABNORMAL LOW (ref 4.0–10.5)
nRBC: 0 % (ref 0.0–0.2)

## 2022-10-18 LAB — CMP (CANCER CENTER ONLY)
ALT: 27 U/L (ref 0–44)
AST: 28 U/L (ref 15–41)
Albumin: 3 g/dL — ABNORMAL LOW (ref 3.5–5.0)
Alkaline Phosphatase: 212 U/L — ABNORMAL HIGH (ref 38–126)
Anion gap: 6 (ref 5–15)
BUN: 18 mg/dL (ref 6–20)
CO2: 28 mmol/L (ref 22–32)
Calcium: 8.3 mg/dL — ABNORMAL LOW (ref 8.9–10.3)
Chloride: 106 mmol/L (ref 98–111)
Creatinine: 0.61 mg/dL (ref 0.61–1.24)
GFR, Estimated: >60 mL/min (ref >60)
Glucose, Bld: 99 mg/dL (ref 70–99)
Potassium: 3.6 mmol/L (ref 3.5–5.1)
Sodium: 140 mmol/L (ref 135–145)
Total Bilirubin: 0.4 mg/dL (ref 0.3–1.2)
Total Protein: 5.9 g/dL — ABNORMAL LOW (ref 6.5–8.1)

## 2022-10-18 MED ORDER — SODIUM CHLORIDE 0.9% FLUSH
10.0000 mL | INTRAVENOUS | Status: DC | PRN
  Administered 2022-10-18: 10 mL via INTRAVENOUS

## 2022-10-18 MED ORDER — HEPARIN SOD (PORK) LOCK FLUSH 100 UNIT/ML IV SOLN
500.0000 [IU] | Freq: Once | INTRAVENOUS | Status: AC
  Administered 2022-10-18: 500 [IU] via INTRAVENOUS

## 2022-10-18 NOTE — Progress Notes (Signed)
[place] OFFICE PROGRESS NOTE   Diagnosis: Pancreas cancer  INTERVAL HISTORY:   Mr.Erisman returns as scheduled.  He completed a cycle of gemcitabine/Abraxane 10/04/2022.  He reports tolerating treatment well.  No fever, rash, nausea, or change in neuropathy symptoms.  He takes oxycodone as needed for abdominal pain.  Reports 2 hours of pain relief when he takes 1 Percocet tablet.  No difficulty with bowel or bladder function.  He last underwent a paracentesis for 1.7 L on 10/07/2022.  Good appetite.  He is working.   Objective:  Vital signs in last 24 hours:  Blood pressure 108/70, pulse 64, temperature 98.1 F (36.7 C), temperature source Oral, resp. rate 20, height _0  (1.803 m), weight 158 lb (71.7 kg), SpO2 100 %.    HEENT: No thrush or ulcers Resp: Decreased breath sounds at the lower chest bilaterally, no respiratory distress Cardio: Regular rate and rhythm GI: Distended with ascites, nontender Vascular: No leg edema    Portacath/PICC-without erythema  Lab Results:  Lab Results  Component Value Date   WBC 1.9 (L) 10/18/2022   HGB 7.8 (L) 10/18/2022   HCT 23.7 (L) 10/18/2022   MCV 110.7 (H) 10/18/2022   PLT 66 (L) 10/18/2022   NEUTROABS 1.2 (L) 10/18/2022    CMP  Lab Results  Component Value Date   NA 140 10/18/2022   K 3.6 10/18/2022   CL 106 10/18/2022   CO2 28 10/18/2022   GLUCOSE 99 10/18/2022   BUN 18 10/18/2022   CREATININE 0.61 10/18/2022   CALCIUM 8.3 (L) 10/18/2022   PROT 5.9 (L) 10/18/2022   ALBUMIN 3.0 (L) 10/18/2022   AST 28 10/18/2022   ALT 27 10/18/2022   ALKPHOS 212 (H) 10/18/2022   BILITOT 0.4 10/18/2022   GFRNONAA >60 10/18/2022   GFRAA >60 08/19/2015    Lab Results  Component Value Date   IBB048 1,363 (H) 10/04/2022    Lab Results  Component Value Date   INR 1.1 09/15/2022   LABPROT 14.1 09/15/2022    Imaging:  No results found.  Medications: I have reviewed the patient's current  medications.   Assessment/Plan: Pancreas cancer, status post a pancreaticoduodenectomy 11/12/2021, stage IIb (pT1,pN1) 1/11 lymph nodes, perineural invasion positive, positive margin at the SMA and SMV MRCP 09/27/2021-diffuse intrahepatic and common bile duct dilatation with abrupt termination at the level of the head of the pancreas, indistinct area of hypoenhancement in the pancreas head ERCP with placement of pancreatic duct and bile duct stents 10/04/2021 EUS 1 10/04/2021-no pancreas mass identified, FNA of pancreas head-"atypical "cells, no adenopathy CT abdomen/pelvis 10/18/2021-no focal liver abnormality, intra and extrahepatic biliary ductal dilatation with narrowing of the common duct and the pancreas head, no enlarged abdominal lymph nodes Cycle 1 FOLFIRINOX 12/27/2021, irinotecan held with cycle 1 Cycle 2 FOLFIRINOX 01/24/2022, oxaliplatin and irinotecan dose reduced secondary to elevated liver enzymes, Udenyca Cycle 3 FOLFIRINOX 02/07/2022, same doses as cycle 2 Cycle 4 FOLFIRINOX 02/21/2022 Cycle 5 FOLFIRINOX 03/07/2022 Cycle 6 FOLFIRINOX 03/22/2022 Cycle 7 FOLFIRINOX 04/05/2022 Cycle 8 FOLFIRINOX 04/19/2022 Radiation/Xeloda 05/09/2022-06/16/2022 Paracentesis 09/06/2022-reactive mesothelial cells CTs 09/08/2022-multiple new right lobe liver lesions, ill-defined soft tissue pancreatic anastomosis, moderate volume ascites Ultrasound-guided biopsy of a right liver lesion 09/15/2022-moderate to poorly differentiated adenocarcinoma consistent with pancreas cancer; foundation 1-microsatellite stable, tumor mutation burden 2, K-ras G12D Acute pancreatitis 10/04/2021 Bipolar disorder Left soleal DVT 11/26/2021-apixaban, discontinued 07/14/2022 Port-A-Cath placement 12/22/2021  Atrial fibrillation 12/22/2021, converted to sinus rhythm, discharged home 12/23/2021 on metoprolol 12.5 mg twice daily,  on Eliquis for prior DVT. Neutropenia secondary to chemotherapy 01/10/2022, Udenyca added with cycle 2 Elevated  liver enzymes, predating chemotherapy Neutropenia/progressive thrombocytopenia following cycle 1 gemcitabine/Abraxane-Udenyca added, chemotherapy dose reduced       Disposition: Mr Chapdelaine appears stable.  He tolerated the first cycle of gemcitabine/Abraxane well.  He has neutropenia and progressive thrombocytopenia today.  We discussed the risk/benefit of continuing chemotherapy.  He will call for a fever or bleeding.  I dose reduced Abraxane and gemcitabine.  He would like to proceed with chemotherapy.  He will receive G-CSF following chemotherapy.  He tolerated Udenyca well in the past.  He will be scheduled for a palliative paracentesis next week.  He will return for an office visit and chemotherapy in 2 weeks.  We will delay today's chemotherapy if we cannot obtain insurance approval for Udenyca.  He will take 1-2 Percocet tablets every 4 hours as needed for pain.  Betsy Coder, MD  10/18/2022  10:53 AM

## 2022-10-18 NOTE — Progress Notes (Signed)
Patient seen by Dr. Benay Spice today  Vitals are within treatment parameters.  Labs reviewed by Dr. Benay Spice and are not all within treatment parameters. Platelets 66,000; ANC 1.2 and Hgb 7.8  Per physician team, patient will not be receiving treatment today.  MD will complete peer-2-peer on 10/19/22 at 12:30 to have Udenyca approved.

## 2022-10-18 NOTE — Addendum Note (Signed)
Addended by: Tania Ade on: 10/18/2022 12:49 PM   Modules accepted: Orders

## 2022-10-18 NOTE — Progress Notes (Signed)
[facility] CSW Progress Note  Holiday representative returned call from patient's wife, Dean [name].  She was attempting to contact the [employer] regarding patient's disability application.  She was able to contact them and obtain the information she needed.  Patient is having his interview next week.  Dean [name] is concerned that patient was unable to receive his treatment today per the MD.  She continues adjusting to the idea of patient's prognosis.  CSW provided active listening and supportive counseling.   [name] [name], LCSW

## 2022-10-19 ENCOUNTER — Ambulatory Visit (HOSPITAL_COMMUNITY)
Admission: RE | Admit: 2022-10-19 | Discharge: 2022-10-19 | Disposition: A | Discharge status: Home / Self Care | Payer: BC Managed Care – PPO | Source: Ambulatory Visit | Attending: Oncology | Admitting: Oncology

## 2022-10-19 ENCOUNTER — Encounter: Payer: Self-pay | Admitting: *Deleted

## 2022-10-19 DIAGNOSIS — C250 Malignant neoplasm of head of pancreas: Secondary | ICD-10-CM | POA: Insufficient documentation

## 2022-10-19 DIAGNOSIS — R188 Other ascites: Secondary | ICD-10-CM | POA: Diagnosis not present

## 2022-10-19 DIAGNOSIS — C259 Malignant neoplasm of pancreas, unspecified: Secondary | ICD-10-CM | POA: Diagnosis not present

## 2022-10-19 DIAGNOSIS — R180 Malignant ascites: Secondary | ICD-10-CM | POA: Diagnosis not present

## 2022-10-19 HISTORY — PX: IR PARACENTESIS: IMG2679

## 2022-10-19 MED ORDER — LIDOCAINE HCL 1 % IJ SOLN
INTRAMUSCULAR | Status: AC
  Administered 2022-10-19: 10 mL
  Filled 2022-10-19: qty 20

## 2022-10-19 NOTE — Procedures (Signed)
PROCEDURE SUMMARY:  Successful US guided diagnostic and therapeutic paracentesis from RLQ.  Yielded 4L of clear, yellow fluid.  No immediate complications.  Pt tolerated well.   Specimen was sent for labs.  EBL < 1 mL  Tyson Alias, AGNP 10/19/2022 10:59 AM

## 2022-10-21 LAB — CYTOLOGY - NON PAP

## 2022-10-24 ENCOUNTER — Encounter: Payer: Self-pay | Admitting: Nurse Practitioner

## 2022-10-25 ENCOUNTER — Other Ambulatory Visit: Payer: Self-pay | Admitting: Nurse Practitioner

## 2022-10-25 ENCOUNTER — Inpatient Hospital Stay: Payer: BC Managed Care – PPO

## 2022-10-25 VITALS — BP 107/71 | HR 57 | Temp 98.2°F | Resp 19 | Ht 71.0 in | Wt 156.8 lb

## 2022-10-25 DIAGNOSIS — R948 Abnormal results of function studies of other organs and systems: Secondary | ICD-10-CM | POA: Diagnosis not present

## 2022-10-25 DIAGNOSIS — C250 Malignant neoplasm of head of pancreas: Secondary | ICD-10-CM

## 2022-10-25 DIAGNOSIS — D696 Thrombocytopenia, unspecified: Secondary | ICD-10-CM | POA: Diagnosis not present

## 2022-10-25 DIAGNOSIS — D709 Neutropenia, unspecified: Secondary | ICD-10-CM | POA: Diagnosis not present

## 2022-10-25 DIAGNOSIS — Z5189 Encounter for other specified aftercare: Secondary | ICD-10-CM | POA: Diagnosis not present

## 2022-10-25 DIAGNOSIS — Z5111 Encounter for antineoplastic chemotherapy: Secondary | ICD-10-CM | POA: Diagnosis not present

## 2022-10-25 DIAGNOSIS — C787 Secondary malignant neoplasm of liver and intrahepatic bile duct: Secondary | ICD-10-CM | POA: Diagnosis not present

## 2022-10-25 DIAGNOSIS — I4891 Unspecified atrial fibrillation: Secondary | ICD-10-CM | POA: Diagnosis not present

## 2022-10-25 DIAGNOSIS — Z86718 Personal history of other venous thrombosis and embolism: Secondary | ICD-10-CM | POA: Diagnosis not present

## 2022-10-25 LAB — CBC WITH DIFFERENTIAL (CANCER CENTER ONLY)
Abs Immature Granulocytes: 0.01 10*3/uL (ref 0.00–0.07)
Basophils Absolute: 0 10*3/uL (ref 0.0–0.1)
Basophils Relative: 1 %
Eosinophils Absolute: 0 10*3/uL (ref 0.0–0.5)
Eosinophils Relative: 2 %
HCT: 25.1 % — ABNORMAL LOW (ref 39.0–52.0)
Hemoglobin: 8.2 g/dL — ABNORMAL LOW (ref 13.0–17.0)
Immature Granulocytes: 1 %
Lymphocytes Relative: 19 %
Lymphs Abs: 0.4 10*3/uL — ABNORMAL LOW (ref 0.7–4.0)
MCH: 36.1 pg — ABNORMAL HIGH (ref 26.0–34.0)
MCHC: 32.7 g/dL (ref 30.0–36.0)
MCV: 110.6 fL — ABNORMAL HIGH (ref 80.0–100.0)
Monocytes Absolute: 0.4 10*3/uL (ref 0.1–1.0)
Monocytes Relative: 21 %
Neutro Abs: 1.1 10*3/uL — ABNORMAL LOW (ref 1.7–7.7)
Neutrophils Relative %: 56 %
Platelet Count: 132 10*3/uL — ABNORMAL LOW (ref 150–400)
RBC: 2.27 MIL/uL — ABNORMAL LOW (ref 4.22–5.81)
RDW: 15.7 % — ABNORMAL HIGH (ref 11.5–15.5)
WBC Count: 1.8 10*3/uL — ABNORMAL LOW (ref 4.0–10.5)
nRBC: 0 % (ref 0.0–0.2)

## 2022-10-25 LAB — CMP (CANCER CENTER ONLY)
ALT: 21 U/L (ref 0–44)
AST: 24 U/L (ref 15–41)
Albumin: 2.9 g/dL — ABNORMAL LOW (ref 3.5–5.0)
Alkaline Phosphatase: 219 U/L — ABNORMAL HIGH (ref 38–126)
Anion gap: 7 (ref 5–15)
BUN: 20 mg/dL (ref 6–20)
CO2: 27 mmol/L (ref 22–32)
Calcium: 8.8 mg/dL — ABNORMAL LOW (ref 8.9–10.3)
Chloride: 104 mmol/L (ref 98–111)
Creatinine: 0.59 mg/dL — ABNORMAL LOW (ref 0.61–1.24)
GFR, Estimated: >60 mL/min (ref >60)
Glucose, Bld: 90 mg/dL (ref 70–99)
Potassium: 3.6 mmol/L (ref 3.5–5.1)
Sodium: 138 mmol/L (ref 135–145)
Total Bilirubin: 0.4 mg/dL (ref 0.3–1.2)
Total Protein: 5.8 g/dL — ABNORMAL LOW (ref 6.5–8.1)

## 2022-10-25 MED ORDER — PROCHLORPERAZINE MALEATE 10 MG PO TABS
10.0000 mg | ORAL_TABLET | Freq: Once | ORAL | Status: AC
  Administered 2022-10-25: 10 mg via ORAL
  Filled 2022-10-25: qty 1

## 2022-10-25 MED ORDER — PACLITAXEL PROTEIN-BOUND CHEMO INJECTION 100 MG
50.0000 mg/m2 | Freq: Once | INTRAVENOUS | Status: AC
  Administered 2022-10-25: 100 mg via INTRAVENOUS
  Filled 2022-10-25: qty 20

## 2022-10-25 MED ORDER — SODIUM CHLORIDE 0.9% FLUSH
10.0000 mL | INTRAVENOUS | Status: DC | PRN
  Administered 2022-10-25: 10 mL

## 2022-10-25 MED ORDER — SODIUM CHLORIDE 0.9 % IV SOLN
500.0000 mg/m2 | Freq: Once | INTRAVENOUS | Status: AC
  Administered 2022-10-25: 950 mg via INTRAVENOUS
  Filled 2022-10-25: qty 24.98

## 2022-10-25 MED ORDER — HEPARIN SOD (PORK) LOCK FLUSH 100 UNIT/ML IV SOLN
500.0000 [IU] | Freq: Once | INTRAVENOUS | Status: AC | PRN
  Administered 2022-10-25: 500 [IU]

## 2022-10-25 MED ORDER — SODIUM CHLORIDE 0.9 % IV SOLN: Freq: Once | INTRAVENOUS | Status: AC

## 2022-10-25 NOTE — Patient Instructions (Signed)
[facility]   Discharge Instructions: Thank you for choosing [facility] to provide your oncology and hematology care.   If you have a lab appointment with the Cancer Center, please go directly to the Cancer Center and check in at the registration area.   Wear comfortable clothing and clothing appropriate for easy access to any Portacath or PICC line.   We strive to give you quality time with your provider. You may need to reschedule your appointment if you arrive late (15 or more minutes).  Arriving late affects you and other patients whose appointments are after yours.  Also, if you miss three or more appointments without notifying the office, you may be dismissed from the clinic at the provider's discretion.      For prescription refill requests, have your pharmacy contact our office and allow 72 hours for refills to be completed.    Today you received the following chemotherapy and/or immunotherapy agents Abraxane, Gemzar      To help prevent nausea and vomiting after your treatment, we encourage you to take your nausea medication as directed.  BELOW ARE SYMPTOMS THAT SHOULD BE REPORTED IMMEDIATELY: *FEVER GREATER THAN 100.4 F (38 C) OR HIGHER *CHILLS OR SWEATING *NAUSEA AND VOMITING THAT IS NOT CONTROLLED WITH YOUR NAUSEA MEDICATION *UNUSUAL SHORTNESS OF BREATH *UNUSUAL BRUISING OR BLEEDING *URINARY PROBLEMS (pain or burning when urinating, or frequent urination) *BOWEL PROBLEMS (unusual diarrhea, constipation, pain near the anus) TENDERNESS IN MOUTH AND THROAT WITH OR WITHOUT PRESENCE OF ULCERS (sore throat, sores in mouth, or a toothache) UNUSUAL RASH, SWELLING OR PAIN  UNUSUAL VAGINAL DISCHARGE OR ITCHING   Items with * indicate a potential emergency and should be followed up as soon as possible or go to the Emergency Department if any problems should occur.  Please show the CHEMOTHERAPY ALERT CARD or IMMUNOTHERAPY ALERT CARD at  check-in to the Emergency Department and triage nurse.  Should you have questions after your visit or need to cancel or reschedule your appointment, please contact [facility]  Dept: [phone]  and follow the prompts.  Office hours are 8:00 a.m. to 4:30 p.m. Monday - Friday. Please note that voicemails left after 4:00 p.m. may not be returned until the following business day.  We are closed weekends and major holidays. You have access to a nurse at all times for urgent questions. Please call the main number to the clinic Dept: [phone] and follow the prompts.   For any non-urgent questions, you may also contact your provider using MyChart. We now offer e-Visits for anyone 18 and older to request care online for non-urgent symptoms. For details visit [email or web address].   Also download the MyChart app! Go to the app store, search "MyChart", open the app, select [facility], and log in with your MyChart username and password.  Masks are optional in the cancer centers. If you would like for your care team to wear a mask while they are taking care of you, please let them know. You may have one support person who is at least 56 years old accompany you for your appointments.  Paclitaxel Nanoparticle Albumin-Bound Injection What is this medication? NANOPARTICLE ALBUMIN-BOUND PACLITAXEL (Na no PAHR ti kuhl al BYOO muhn-bound PAK li TAX el) treats some types of cancer. It works by slowing down the growth of cancer cells. This medicine may be used for other purposes; ask your health care provider or pharmacist if you have questions. COMMON BRAND   NAME(S): Abraxane What should I tell my care team before I take this medication? They need to know if you have any of these conditions: Liver disease Low white blood cell levels An unusual or allergic reaction to paclitaxel, albumin, other medications, foods, dyes, or preservatives If you or your partner are pregnant or  trying to get pregnant Breast-feeding How should I use this medication? This medication is injected into a vein. It is given by your care team in a hospital or clinic setting. Talk to your care team about the use of this medication in children. Special care may be needed. Overdosage: If you think you have taken too much of this medicine contact a poison control center or emergency room at once. NOTE: This medicine is only for you. Do not share this medicine with others. What if I miss a dose? Keep appointments for follow-up doses. It is important not to miss your dose. Call your care team if you are unable to keep an appointment. What may interact with this medication? Other medications may affect the way this medication works. Talk with your care team about all of the medications you take. They may suggest changes to your treatment plan to lower the risk of side effects and to make sure your medications work as intended. This list may not describe all possible interactions. Give your health care provider a list of all the medicines, herbs, non-prescription drugs, or dietary supplements you use. Also tell them if you smoke, drink alcohol, or use illegal drugs. Some items may interact with your medicine. What should I watch for while using this medication? Your condition will be monitored carefully while you are receiving this medication. You may need blood work while taking this medication. This medication may make you feel generally unwell. This is not uncommon as chemotherapy can affect healthy cells as well as cancer cells. Report any side effects. Continue your course of treatment even though you feel ill unless your care team tells you to stop. This medication can cause serious allergic reactions. To reduce the risk, your care team may give you other medications to take before receiving this one. Be sure to follow the directions from your care team. This medication may increase your risk of  getting an infection. Call your care team for advice if you get a fever, chills, sore throat, or other symptoms of a cold or flu. Do not treat yourself. Try to avoid being around people who are sick. This medication may increase your risk to bruise or bleed. Call your care team if you notice any unusual bleeding. Be careful brushing or flossing your teeth or using a toothpick because you may get an infection or bleed more easily. If you have any dental work done, tell your dentist you are receiving this medication. Talk to your care team if you or your partner may be pregnant. Serious birth defects can occur if you take this medication during pregnancy and for 6 months after the last dose. You will need a negative pregnancy test before starting this medication. Contraception is recommended while taking this medication and for 6 months after the last dose. Your care team can help you find the option that works for you. If your partner can get pregnant, use a condom during sex while taking this medication and for 3 months after the last dose. Do not breastfeed while taking this medication and for 2 weeks after the last dose. This medication may cause infertility. Talk to your care team   if you are concerned about your fertility. What side effects may I notice from receiving this medication? Side effects that you should report to your care team as soon as possible: Allergic reactions--skin rash, itching, hives, swelling of the face, lips, tongue, or throat Dry cough, shortness of breath or trouble breathing Infection--fever, chills, cough, sore throat, wounds that don't heal, pain or trouble when passing urine, general feeling of discomfort or being unwell Low red blood cell level--unusual weakness or fatigue, dizziness, headache, trouble breathing Pain, tingling, or numbness in the hands or feet Stomach pain, unusual weakness or fatigue, nausea, vomiting, diarrhea, or fever that lasts longer than  expected Unusual bruising or bleeding Side effects that usually do not require medical attention (report to your care team if they continue or are bothersome): Diarrhea Fatigue Hair loss Loss of appetite Nausea Vomiting This list may not describe all possible side effects. Call your doctor for medical advice about side effects. You may report side effects to FDA at 1-800-FDA-1088. Where should I keep my medication? This medication is given in a hospital or clinic. It will not be stored at home. NOTE: This sheet is a summary. It may not cover all possible information. If you have questions about this medicine, talk to your doctor, pharmacist, or health care provider.  2023 Elsevier/Gold Standard (2008-01-12 00:00:00)  Gemcitabine Injection What is this medication? GEMCITABINE (jem SYE ta been) treats some types of cancer. It works by slowing down the growth of cancer cells. This medicine may be used for other purposes; ask your health care provider or pharmacist if you have questions. COMMON BRAND NAME(S): Gemzar, Infugem What should I tell my care team before I take this medication? They need to know if you have any of these conditions: Blood disorders Infection Kidney disease Liver disease Lung or breathing disease, such as asthma or COPD Recent or ongoing radiation therapy An unusual or allergic reaction to gemcitabine, other medications, foods, dyes, or preservatives If you or your partner are pregnant or trying to get pregnant Breast-feeding How should I use this medication? This medication is injected into a vein. It is given by your care team in a hospital or clinic setting. Talk to your care team about the use of this medication in children. Special care may be needed. Overdosage: If you think you have taken too much of this medicine contact a poison control center or emergency room at once. NOTE: This medicine is only for you. Do not share this medicine with others. What  if I miss a dose? Keep appointments for follow-up doses. It is important not to miss your dose. Call your care team if you are unable to keep an appointment. What may interact with this medication? Interactions have not been studied. This list may not describe all possible interactions. Give your health care provider a list of all the medicines, herbs, non-prescription drugs, or dietary supplements you use. Also tell them if you smoke, drink alcohol, or use illegal drugs. Some items may interact with your medicine. What should I watch for while using this medication? Your condition will be monitored carefully while you are receiving this medication. This medication may make you feel generally unwell. This is not uncommon, as chemotherapy can affect healthy cells as well as cancer cells. Report any side effects. Continue your course of treatment even though you feel ill unless your care team tells you to stop. In some cases, you may be given additional medications to help with side effects. Follow  all directions for their use. This medication may increase your risk of getting an infection. Call your care team for advice if you get a fever, chills, sore throat, or other symptoms of a cold or flu. Do not treat yourself. Try to avoid being around people who are sick. This medication may increase your risk to bruise or bleed. Call your care team if you notice any unusual bleeding. Be careful brushing or flossing your teeth or using a toothpick because you may get an infection or bleed more easily. If you have any dental work done, tell your dentist you are receiving this medication. Avoid taking medications that contain aspirin, acetaminophen, ibuprofen, naproxen, or ketoprofen unless instructed by your care team. These medications may hide a fever. Talk to your care team if you or your partner wish to become pregnant or think you might be pregnant. This medication can cause serious birth defects if taken  during pregnancy and for 6 months after the last dose. A negative pregnancy test is required before starting this medication. A reliable form of contraception is recommended while taking this medication and for 6 months after the last dose. Talk to your care team about effective forms of contraception. Do not father a child while taking this medication and for 3 months after the last dose. Use a condom while having sex during this time period. Do not breastfeed while taking this medication and for at least 1 week after the last dose. This medication may cause infertility. Talk to your care team if you are concerned about your fertility. What side effects may I notice from receiving this medication? Side effects that you should report to your care team as soon as possible: Allergic reactions--skin rash, itching, hives, swelling of the face, lips, tongue, or throat Capillary leak syndrome--stomach or muscle pain, unusual weakness or fatigue, feeling faint or lightheaded, decrease in the amount of urine, swelling of the ankles, hands, or feet, trouble breathing Infection--fever, chills, cough, sore throat, wounds that don't heal, pain or trouble when passing urine, general feeling of discomfort or being unwell Liver injury--right upper belly pain, loss of appetite, nausea, light-colored stool, dark yellow or brown urine, yellowing skin or eyes, unusual weakness or fatigue Low red blood cell level--unusual weakness or fatigue, dizziness, headache, trouble breathing Lung injury--shortness of breath or trouble breathing, cough, spitting up blood, chest pain, fever Stomach pain, bloody diarrhea, pale skin, unusual weakness or fatigue, decrease in the amount of urine, which may be signs of hemolytic uremic syndrome Sudden and severe headache, confusion, change in vision, seizures, which may be signs of posterior reversible encephalopathy syndrome (PRES) Unusual bruising or bleeding Side effects that usually do  not require medical attention (report to your care team if they continue or are bothersome): Diarrhea Drowsiness Hair loss Nausea Pain, redness, or swelling with sores inside the mouth or throat Vomiting This list may not describe all possible side effects. Call your doctor for medical advice about side effects. You may report side effects to FDA at 1-800-FDA-1088. Where should I keep my medication? This medication is given in a hospital or clinic. It will not be stored at home. NOTE: This sheet is a summary. It may not cover all possible information. If you have questions about this medicine, talk to your doctor, pharmacist, or health care provider.  2023 Elsevier/Gold Standard (2008-01-12 00:00:00)

## 2022-10-25 NOTE — Progress Notes (Signed)
Per Dr. Benay Spice, ok to treat with ANC 1.1.  Gemzar and Abraxane have been dose reduced and patient to get udenyca injection on 10/26/22.

## 2022-10-26 ENCOUNTER — Inpatient Hospital Stay: Payer: BC Managed Care – PPO

## 2022-10-26 VITALS — BP 103/60 | HR 62 | Temp 98.0°F | Resp 20

## 2022-10-26 DIAGNOSIS — Z86718 Personal history of other venous thrombosis and embolism: Secondary | ICD-10-CM | POA: Diagnosis not present

## 2022-10-26 DIAGNOSIS — R948 Abnormal results of function studies of other organs and systems: Secondary | ICD-10-CM | POA: Diagnosis not present

## 2022-10-26 DIAGNOSIS — Z5189 Encounter for other specified aftercare: Secondary | ICD-10-CM | POA: Diagnosis not present

## 2022-10-26 DIAGNOSIS — I4891 Unspecified atrial fibrillation: Secondary | ICD-10-CM | POA: Diagnosis not present

## 2022-10-26 DIAGNOSIS — C250 Malignant neoplasm of head of pancreas: Secondary | ICD-10-CM | POA: Diagnosis not present

## 2022-10-26 DIAGNOSIS — Z5111 Encounter for antineoplastic chemotherapy: Secondary | ICD-10-CM | POA: Diagnosis not present

## 2022-10-26 DIAGNOSIS — D696 Thrombocytopenia, unspecified: Secondary | ICD-10-CM | POA: Diagnosis not present

## 2022-10-26 DIAGNOSIS — C787 Secondary malignant neoplasm of liver and intrahepatic bile duct: Secondary | ICD-10-CM | POA: Diagnosis not present

## 2022-10-26 DIAGNOSIS — D709 Neutropenia, unspecified: Secondary | ICD-10-CM | POA: Diagnosis not present

## 2022-10-26 MED ORDER — PEGFILGRASTIM-CBQV 6 MG/0.6ML ~~LOC~~ SOSY
6.0000 mg | PREFILLED_SYRINGE | Freq: Once | SUBCUTANEOUS | Status: AC
  Administered 2022-10-26: 6 mg via SUBCUTANEOUS
  Filled 2022-10-26: qty 0.6

## 2022-10-26 NOTE — Patient Instructions (Signed)

## 2022-10-31 ENCOUNTER — Other Ambulatory Visit: Payer: Self-pay | Admitting: Nurse Practitioner

## 2022-10-31 ENCOUNTER — Telehealth: Payer: Self-pay | Admitting: *Deleted

## 2022-10-31 ENCOUNTER — Encounter: Payer: Self-pay | Admitting: Nurse Practitioner

## 2022-10-31 ENCOUNTER — Inpatient Hospital Stay: Payer: BC Managed Care – PPO

## 2022-10-31 ENCOUNTER — Inpatient Hospital Stay: Payer: BC Managed Care – PPO | Admitting: Nurse Practitioner

## 2022-10-31 DIAGNOSIS — C250 Malignant neoplasm of head of pancreas: Secondary | ICD-10-CM

## 2022-10-31 MED ORDER — OXYCODONE-ACETAMINOPHEN 5-325 MG PO TABS: 1.0000 | ORAL_TABLET | ORAL | 0 refills | Status: DC | PRN

## 2022-10-31 NOTE — Telephone Encounter (Signed)
Mrs. Conradt is requesting call from MD or NP to go over his recent paracentesis results when fluid was tested. She is asking if we are testing for different things since the wording on the results is different.

## 2022-10-31 NOTE — Telephone Encounter (Addendum)
Called Mrs. Beringer at request of patient and she has the following questions: What is causing the ascites? Is is due to cancer in liver? Would a diuretic help the swelling any? Requesting to try. Patient needs a paracentesis this week and would like cytology again. Asking if negative cytology means it is not the cancer? Asking what objective things can be seen to determine if he is responding to the chemo? He has also decided to file for SS Disability now and is no longer working after today. PER DR. SHERRILL: Likely cancer in lining of abdomen, often takes 2-3 cytology evaluations to diagnose. Diuretics usually do not help. Can consider spironolactone if next cytology is negative. Response to therapy can be noted by CA19-9 and CT scan. Plan to scan after 5 cycles of chemotherapy. US paracentesis scheduled for [facility] on 11/29 at 2:45/3:00 pm.

## 2022-11-02 ENCOUNTER — Ambulatory Visit (HOSPITAL_COMMUNITY)
Admission: RE | Admit: 2022-11-02 | Discharge: 2022-11-02 | Disposition: A | Discharge status: Home / Self Care | Payer: BC Managed Care – PPO | Source: Ambulatory Visit | Attending: Oncology | Admitting: Oncology

## 2022-11-02 ENCOUNTER — Telehealth: Payer: Self-pay | Admitting: *Deleted

## 2022-11-02 DIAGNOSIS — C259 Malignant neoplasm of pancreas, unspecified: Secondary | ICD-10-CM | POA: Diagnosis not present

## 2022-11-02 DIAGNOSIS — C250 Malignant neoplasm of head of pancreas: Secondary | ICD-10-CM | POA: Insufficient documentation

## 2022-11-02 DIAGNOSIS — R188 Other ascites: Secondary | ICD-10-CM | POA: Diagnosis not present

## 2022-11-02 MED ORDER — LIDOCAINE HCL 1 % IJ SOLN
INTRAMUSCULAR | Status: AC
  Administered 2022-11-02: 12 mL
  Filled 2022-11-02: qty 20

## 2022-11-02 NOTE — Procedures (Signed)
Ultrasound-guided diagnostic and therapeutic paracentesis performed yielding 4 liters of yellow  fluid. No immediate complications. A portion of the fluid was sent for cytology. EBL none.

## 2022-11-02 NOTE — Telephone Encounter (Signed)
Dean [name] called this morning to report he is having a lot more abdominal pain than usual and is very nauseated as well. Just gave him anti-emetic and pain pill 15 minutes ago. Reports that he seems to be slightly more short of breath today as well. No chest pain. Abdomen is swollen and he has a paracentesis scheduled for today. Has not had BM in several days and is trying to go. Asking what to do? Informed her to wait and see what the meds do and to keep paracentesis appointment. SOB could be from pressure on diaphragm and also due to pain crisis. Will call her back in ~ 1 hour. @ 33 called Dean [name] back: His nausea has resolved and pain is much better and breathing has settled down. Suggested they work on getting his bowels moving after he comes back from paracentesis today and she agrees.

## 2022-11-02 NOTE — Telephone Encounter (Signed)
Wife called back requesting a lightweight folding wheelchair be ordered for husband. Ordered w/c on [facility] for delivery on 11/30.

## 2022-11-03 ENCOUNTER — Other Ambulatory Visit: Payer: Self-pay | Admitting: *Deleted

## 2022-11-03 DIAGNOSIS — C250 Malignant neoplasm of head of pancreas: Secondary | ICD-10-CM

## 2022-11-03 NOTE — Progress Notes (Signed)
Patient requesting via MyChart paracentesis on 12/6 before going out of town.Scheduled at [facility] at 1300/1330. Patient notified.

## 2022-11-06 ENCOUNTER — Other Ambulatory Visit: Payer: Self-pay | Admitting: Oncology

## 2022-11-07 ENCOUNTER — Other Ambulatory Visit: Payer: Self-pay | Admitting: Nurse Practitioner

## 2022-11-07 DIAGNOSIS — C250 Malignant neoplasm of head of pancreas: Secondary | ICD-10-CM

## 2022-11-07 MED ORDER — OXYCODONE-ACETAMINOPHEN 5-325 MG PO TABS: 1.0000 | ORAL_TABLET | ORAL | 0 refills | Status: DC | PRN

## 2022-11-08 ENCOUNTER — Inpatient Hospital Stay (HOSPITAL_BASED_OUTPATIENT_CLINIC_OR_DEPARTMENT_OTHER): Payer: BC Managed Care – PPO | Admitting: Nurse Practitioner

## 2022-11-08 ENCOUNTER — Encounter: Payer: Self-pay | Admitting: Nurse Practitioner

## 2022-11-08 ENCOUNTER — Inpatient Hospital Stay: Payer: BC Managed Care – PPO | Admitting: Licensed Clinical Social Worker

## 2022-11-08 ENCOUNTER — Inpatient Hospital Stay: Payer: BC Managed Care – PPO

## 2022-11-08 ENCOUNTER — Inpatient Hospital Stay: Payer: BC Managed Care – PPO | Attending: Oncology

## 2022-11-08 VITALS — BP 112/69 | HR 68 | Temp 98.4°F | Resp 16 | Wt 154.0 lb

## 2022-11-08 VITALS — BP 103/69 | HR 55

## 2022-11-08 DIAGNOSIS — R140 Abdominal distension (gaseous): Secondary | ICD-10-CM | POA: Insufficient documentation

## 2022-11-08 DIAGNOSIS — T451X5A Adverse effect of antineoplastic and immunosuppressive drugs, initial encounter: Secondary | ICD-10-CM | POA: Diagnosis not present

## 2022-11-08 DIAGNOSIS — Z7901 Long term (current) use of anticoagulants: Secondary | ICD-10-CM | POA: Diagnosis not present

## 2022-11-08 DIAGNOSIS — I4891 Unspecified atrial fibrillation: Secondary | ICD-10-CM | POA: Insufficient documentation

## 2022-11-08 DIAGNOSIS — Z5189 Encounter for other specified aftercare: Secondary | ICD-10-CM | POA: Insufficient documentation

## 2022-11-08 DIAGNOSIS — I82462 Acute embolism and thrombosis of left calf muscular vein: Secondary | ICD-10-CM | POA: Insufficient documentation

## 2022-11-08 DIAGNOSIS — K859 Acute pancreatitis without necrosis or infection, unspecified: Secondary | ICD-10-CM | POA: Insufficient documentation

## 2022-11-08 DIAGNOSIS — Z923 Personal history of irradiation: Secondary | ICD-10-CM | POA: Diagnosis not present

## 2022-11-08 DIAGNOSIS — C250 Malignant neoplasm of head of pancreas: Secondary | ICD-10-CM

## 2022-11-08 DIAGNOSIS — D701 Agranulocytosis secondary to cancer chemotherapy: Secondary | ICD-10-CM | POA: Insufficient documentation

## 2022-11-08 DIAGNOSIS — Z5111 Encounter for antineoplastic chemotherapy: Secondary | ICD-10-CM | POA: Diagnosis not present

## 2022-11-08 DIAGNOSIS — D696 Thrombocytopenia, unspecified: Secondary | ICD-10-CM | POA: Insufficient documentation

## 2022-11-08 DIAGNOSIS — Z86718 Personal history of other venous thrombosis and embolism: Secondary | ICD-10-CM | POA: Diagnosis not present

## 2022-11-08 DIAGNOSIS — F319 Bipolar disorder, unspecified: Secondary | ICD-10-CM | POA: Insufficient documentation

## 2022-11-08 DIAGNOSIS — Z79899 Other long term (current) drug therapy: Secondary | ICD-10-CM | POA: Diagnosis not present

## 2022-11-08 LAB — CMP (CANCER CENTER ONLY)
ALT: 18 U/L (ref 0–44)
AST: 21 U/L (ref 15–41)
Albumin: 2.9 g/dL — ABNORMAL LOW (ref 3.5–5.0)
Alkaline Phosphatase: 335 U/L — ABNORMAL HIGH (ref 38–126)
Anion gap: 7 (ref 5–15)
BUN: 17 mg/dL (ref 6–20)
CO2: 27 mmol/L (ref 22–32)
Calcium: 8.4 mg/dL — ABNORMAL LOW (ref 8.9–10.3)
Chloride: 102 mmol/L (ref 98–111)
Creatinine: 0.53 mg/dL — ABNORMAL LOW (ref 0.61–1.24)
GFR, Estimated: >60 mL/min (ref >60)
Glucose, Bld: 95 mg/dL (ref 70–99)
Potassium: 3.7 mmol/L (ref 3.5–5.1)
Sodium: 136 mmol/L (ref 135–145)
Total Bilirubin: 0.6 mg/dL (ref 0.3–1.2)
Total Protein: 6.1 g/dL — ABNORMAL LOW (ref 6.5–8.1)

## 2022-11-08 LAB — CBC WITH DIFFERENTIAL (CANCER CENTER ONLY)
Abs Immature Granulocytes: 0.1 10*3/uL — ABNORMAL HIGH (ref 0.00–0.07)
Basophils Absolute: 0 10*3/uL (ref 0.0–0.1)
Basophils Relative: 0 %
Eosinophils Absolute: 0 10*3/uL (ref 0.0–0.5)
Eosinophils Relative: 0 %
HCT: 26.3 % — ABNORMAL LOW (ref 39.0–52.0)
Hemoglobin: 8.6 g/dL — ABNORMAL LOW (ref 13.0–17.0)
Immature Granulocytes: 1 %
Lymphocytes Relative: 5 %
Lymphs Abs: 0.5 10*3/uL — ABNORMAL LOW (ref 0.7–4.0)
MCH: 36 pg — ABNORMAL HIGH (ref 26.0–34.0)
MCHC: 32.7 g/dL (ref 30.0–36.0)
MCV: 110 fL — ABNORMAL HIGH (ref 80.0–100.0)
Monocytes Absolute: 0.6 10*3/uL (ref 0.1–1.0)
Monocytes Relative: 5 %
Neutro Abs: 9.9 10*3/uL — ABNORMAL HIGH (ref 1.7–7.7)
Neutrophils Relative %: 89 %
Platelet Count: 111 10*3/uL — ABNORMAL LOW (ref 150–400)
RBC: 2.39 MIL/uL — ABNORMAL LOW (ref 4.22–5.81)
RDW: 15.9 % — ABNORMAL HIGH (ref 11.5–15.5)
WBC Count: 11.1 10*3/uL — ABNORMAL HIGH (ref 4.0–10.5)
nRBC: 0 % (ref 0.0–0.2)

## 2022-11-08 LAB — CYTOLOGY - NON PAP

## 2022-11-08 MED ORDER — PACLITAXEL PROTEIN-BOUND CHEMO INJECTION 100 MG
50.0000 mg/m2 | Freq: Once | INTRAVENOUS | Status: AC
  Administered 2022-11-08: 100 mg via INTRAVENOUS
  Filled 2022-11-08: qty 20

## 2022-11-08 MED ORDER — SODIUM CHLORIDE 0.9% FLUSH
10.0000 mL | INTRAVENOUS | Status: DC | PRN
  Administered 2022-11-08: 10 mL

## 2022-11-08 MED ORDER — SODIUM CHLORIDE 0.9 % IV SOLN
500.0000 mg/m2 | Freq: Once | INTRAVENOUS | Status: AC
  Administered 2022-11-08: 950 mg via INTRAVENOUS
  Filled 2022-11-08: qty 24.98

## 2022-11-08 MED ORDER — HEPARIN SOD (PORK) LOCK FLUSH 100 UNIT/ML IV SOLN
500.0000 [IU] | Freq: Once | INTRAVENOUS | Status: AC | PRN
  Administered 2022-11-08: 500 [IU]

## 2022-11-08 MED ORDER — SODIUM CHLORIDE 0.9 % IV SOLN: Freq: Once | INTRAVENOUS | Status: AC

## 2022-11-08 MED ORDER — PROCHLORPERAZINE MALEATE 10 MG PO TABS
10.0000 mg | ORAL_TABLET | Freq: Once | ORAL | Status: AC
  Administered 2022-11-08: 10 mg via ORAL
  Filled 2022-11-08: qty 1

## 2022-11-08 NOTE — Patient Instructions (Signed)
[facility]   Discharge Instructions: Thank you for choosing [facility] to provide your oncology and hematology care.   If you have a lab appointment with the Cancer Center, please go directly to the Cancer Center and check in at the registration area.   Wear comfortable clothing and clothing appropriate for easy access to any Portacath or PICC line.   We strive to give you quality time with your provider. You may need to reschedule your appointment if you arrive late (15 or more minutes).  Arriving late affects you and other patients whose appointments are after yours.  Also, if you miss three or more appointments without notifying the office, you may be dismissed from the clinic at the provider's discretion.      For prescription refill requests, have your pharmacy contact our office and allow 72 hours for refills to be completed.    Today you received the following chemotherapy and/or immunotherapy agents Abraxane, Gemzar      To help prevent nausea and vomiting after your treatment, we encourage you to take your nausea medication as directed.  BELOW ARE SYMPTOMS THAT SHOULD BE REPORTED IMMEDIATELY: *FEVER GREATER THAN 100.4 F (38 C) OR HIGHER *CHILLS OR SWEATING *NAUSEA AND VOMITING THAT IS NOT CONTROLLED WITH YOUR NAUSEA MEDICATION *UNUSUAL SHORTNESS OF BREATH *UNUSUAL BRUISING OR BLEEDING *URINARY PROBLEMS (pain or burning when urinating, or frequent urination) *BOWEL PROBLEMS (unusual diarrhea, constipation, pain near the anus) TENDERNESS IN MOUTH AND THROAT WITH OR WITHOUT PRESENCE OF ULCERS (sore throat, sores in mouth, or a toothache) UNUSUAL RASH, SWELLING OR PAIN  UNUSUAL VAGINAL DISCHARGE OR ITCHING   Items with * indicate a potential emergency and should be followed up as soon as possible or go to the Emergency Department if any problems should occur.  Please show the CHEMOTHERAPY ALERT CARD or IMMUNOTHERAPY ALERT CARD at  check-in to the Emergency Department and triage nurse.  Should you have questions after your visit or need to cancel or reschedule your appointment, please contact [facility]  Dept: [phone]  and follow the prompts.  Office hours are 8:00 a.m. to 4:30 p.m. Monday - Friday. Please note that voicemails left after 4:00 p.m. may not be returned until the following business day.  We are closed weekends and major holidays. You have access to a nurse at all times for urgent questions. Please call the main number to the clinic Dept: [phone] and follow the prompts.   For any non-urgent questions, you may also contact your provider using MyChart. We now offer e-Visits for anyone 18 and older to request care online for non-urgent symptoms. For details visit [email or web address].   Also download the MyChart app! Go to the app store, search "MyChart", open the app, select [facility], and log in with your MyChart username and password.  Masks are optional in the cancer centers. If you would like for your care team to wear a mask while they are taking care of you, please let them know. You may have one support person who is at least 56 years old accompany you for your appointments.  Paclitaxel Nanoparticle Albumin-Bound Injection What is this medication? NANOPARTICLE ALBUMIN-BOUND PACLITAXEL (Na no PAHR ti kuhl al BYOO muhn-bound PAK li TAX el) treats some types of cancer. It works by slowing down the growth of cancer cells. This medicine may be used for other purposes; ask your health care provider or pharmacist if you have questions. COMMON BRAND   NAME(S): Abraxane What should I tell my care team before I take this medication? They need to know if you have any of these conditions: Liver disease Low white blood cell levels An unusual or allergic reaction to paclitaxel, albumin, other medications, foods, dyes, or preservatives If you or your partner are pregnant or  trying to get pregnant Breast-feeding How should I use this medication? This medication is injected into a vein. It is given by your care team in a hospital or clinic setting. Talk to your care team about the use of this medication in children. Special care may be needed. Overdosage: If you think you have taken too much of this medicine contact a poison control center or emergency room at once. NOTE: This medicine is only for you. Do not share this medicine with others. What if I miss a dose? Keep appointments for follow-up doses. It is important not to miss your dose. Call your care team if you are unable to keep an appointment. What may interact with this medication? Other medications may affect the way this medication works. Talk with your care team about all of the medications you take. They may suggest changes to your treatment plan to lower the risk of side effects and to make sure your medications work as intended. This list may not describe all possible interactions. Give your health care provider a list of all the medicines, herbs, non-prescription drugs, or dietary supplements you use. Also tell them if you smoke, drink alcohol, or use illegal drugs. Some items may interact with your medicine. What should I watch for while using this medication? Your condition will be monitored carefully while you are receiving this medication. You may need blood work while taking this medication. This medication may make you feel generally unwell. This is not uncommon as chemotherapy can affect healthy cells as well as cancer cells. Report any side effects. Continue your course of treatment even though you feel ill unless your care team tells you to stop. This medication can cause serious allergic reactions. To reduce the risk, your care team may give you other medications to take before receiving this one. Be sure to follow the directions from your care team. This medication may increase your risk of  getting an infection. Call your care team for advice if you get a fever, chills, sore throat, or other symptoms of a cold or flu. Do not treat yourself. Try to avoid being around people who are sick. This medication may increase your risk to bruise or bleed. Call your care team if you notice any unusual bleeding. Be careful brushing or flossing your teeth or using a toothpick because you may get an infection or bleed more easily. If you have any dental work done, tell your dentist you are receiving this medication. Talk to your care team if you or your partner may be pregnant. Serious birth defects can occur if you take this medication during pregnancy and for 6 months after the last dose. You will need a negative pregnancy test before starting this medication. Contraception is recommended while taking this medication and for 6 months after the last dose. Your care team can help you find the option that works for you. If your partner can get pregnant, use a condom during sex while taking this medication and for 3 months after the last dose. Do not breastfeed while taking this medication and for 2 weeks after the last dose. This medication may cause infertility. Talk to your care team   if you are concerned about your fertility. What side effects may I notice from receiving this medication? Side effects that you should report to your care team as soon as possible: Allergic reactions--skin rash, itching, hives, swelling of the face, lips, tongue, or throat Dry cough, shortness of breath or trouble breathing Infection--fever, chills, cough, sore throat, wounds that don't heal, pain or trouble when passing urine, general feeling of discomfort or being unwell Low red blood cell level--unusual weakness or fatigue, dizziness, headache, trouble breathing Pain, tingling, or numbness in the hands or feet Stomach pain, unusual weakness or fatigue, nausea, vomiting, diarrhea, or fever that lasts longer than  expected Unusual bruising or bleeding Side effects that usually do not require medical attention (report to your care team if they continue or are bothersome): Diarrhea Fatigue Hair loss Loss of appetite Nausea Vomiting This list may not describe all possible side effects. Call your doctor for medical advice about side effects. You may report side effects to FDA at 1-800-FDA-1088. Where should I keep my medication? This medication is given in a hospital or clinic. It will not be stored at home. NOTE: This sheet is a summary. It may not cover all possible information. If you have questions about this medicine, talk to your doctor, pharmacist, or health care provider.  2023 Elsevier/Gold Standard (2008-01-12 00:00:00)  Gemcitabine Injection What is this medication? GEMCITABINE (jem SYE ta been) treats some types of cancer. It works by slowing down the growth of cancer cells. This medicine may be used for other purposes; ask your health care provider or pharmacist if you have questions. COMMON BRAND NAME(S): Gemzar, Infugem What should I tell my care team before I take this medication? They need to know if you have any of these conditions: Blood disorders Infection Kidney disease Liver disease Lung or breathing disease, such as asthma or COPD Recent or ongoing radiation therapy An unusual or allergic reaction to gemcitabine, other medications, foods, dyes, or preservatives If you or your partner are pregnant or trying to get pregnant Breast-feeding How should I use this medication? This medication is injected into a vein. It is given by your care team in a hospital or clinic setting. Talk to your care team about the use of this medication in children. Special care may be needed. Overdosage: If you think you have taken too much of this medicine contact a poison control center or emergency room at once. NOTE: This medicine is only for you. Do not share this medicine with others. What  if I miss a dose? Keep appointments for follow-up doses. It is important not to miss your dose. Call your care team if you are unable to keep an appointment. What may interact with this medication? Interactions have not been studied. This list may not describe all possible interactions. Give your health care provider a list of all the medicines, herbs, non-prescription drugs, or dietary supplements you use. Also tell them if you smoke, drink alcohol, or use illegal drugs. Some items may interact with your medicine. What should I watch for while using this medication? Your condition will be monitored carefully while you are receiving this medication. This medication may make you feel generally unwell. This is not uncommon, as chemotherapy can affect healthy cells as well as cancer cells. Report any side effects. Continue your course of treatment even though you feel ill unless your care team tells you to stop. In some cases, you may be given additional medications to help with side effects. Follow  all directions for their use. This medication may increase your risk of getting an infection. Call your care team for advice if you get a fever, chills, sore throat, or other symptoms of a cold or flu. Do not treat yourself. Try to avoid being around people who are sick. This medication may increase your risk to bruise or bleed. Call your care team if you notice any unusual bleeding. Be careful brushing or flossing your teeth or using a toothpick because you may get an infection or bleed more easily. If you have any dental work done, tell your dentist you are receiving this medication. Avoid taking medications that contain aspirin, acetaminophen, ibuprofen, naproxen, or ketoprofen unless instructed by your care team. These medications may hide a fever. Talk to your care team if you or your partner wish to become pregnant or think you might be pregnant. This medication can cause serious birth defects if taken  during pregnancy and for 6 months after the last dose. A negative pregnancy test is required before starting this medication. A reliable form of contraception is recommended while taking this medication and for 6 months after the last dose. Talk to your care team about effective forms of contraception. Do not father a child while taking this medication and for 3 months after the last dose. Use a condom while having sex during this time period. Do not breastfeed while taking this medication and for at least 1 week after the last dose. This medication may cause infertility. Talk to your care team if you are concerned about your fertility. What side effects may I notice from receiving this medication? Side effects that you should report to your care team as soon as possible: Allergic reactions--skin rash, itching, hives, swelling of the face, lips, tongue, or throat Capillary leak syndrome--stomach or muscle pain, unusual weakness or fatigue, feeling faint or lightheaded, decrease in the amount of urine, swelling of the ankles, hands, or feet, trouble breathing Infection--fever, chills, cough, sore throat, wounds that don't heal, pain or trouble when passing urine, general feeling of discomfort or being unwell Liver injury--right upper belly pain, loss of appetite, nausea, light-colored stool, dark yellow or brown urine, yellowing skin or eyes, unusual weakness or fatigue Low red blood cell level--unusual weakness or fatigue, dizziness, headache, trouble breathing Lung injury--shortness of breath or trouble breathing, cough, spitting up blood, chest pain, fever Stomach pain, bloody diarrhea, pale skin, unusual weakness or fatigue, decrease in the amount of urine, which may be signs of hemolytic uremic syndrome Sudden and severe headache, confusion, change in vision, seizures, which may be signs of posterior reversible encephalopathy syndrome (PRES) Unusual bruising or bleeding Side effects that usually do  not require medical attention (report to your care team if they continue or are bothersome): Diarrhea Drowsiness Hair loss Nausea Pain, redness, or swelling with sores inside the mouth or throat Vomiting This list may not describe all possible side effects. Call your doctor for medical advice about side effects. You may report side effects to FDA at 1-800-FDA-1088. Where should I keep my medication? This medication is given in a hospital or clinic. It will not be stored at home. NOTE: This sheet is a summary. It may not cover all possible information. If you have questions about this medicine, talk to your doctor, pharmacist, or health care provider.  2023 Elsevier/Gold Standard (2008-01-12 00:00:00)

## 2022-11-08 NOTE — Progress Notes (Signed)
[facility] OFFICE PROGRESS NOTE   Diagnosis: Pancreas cancer  INTERVAL HISTORY:   Mr. Cockerell returns as scheduled.  He completed cycle 2 Gemcitabine/Abraxane 10/25/2022.  Chemotherapy doses were reduced due to neutropenia and thrombocytopenia.  No nausea or vomiting following chemotherapy.  He does have intermittent nausea which he attributes to abdominal pain.  No mouth sores.  No diarrhea.  No fever or rash after treatment.  Abdominal distention is "same".  He notes significant improvement lasting for about 1 week after each paracentesis.  Objective:  Vital signs in last 24 hours:  Blood pressure 112/69, pulse 68, temperature 98.4 F (36.9 C), temperature source Oral, resp. rate 16, weight 154 lb (69.9 kg), SpO2 99 %.    HEENT: No thrush or ulcers. Resp: Lungs are clear, breath sounds diminished at the bases. Cardio: Regular rate and rhythm. GI: Abdomen distended consistent with ascites. Vascular: Pitting edema lower leg bilaterally. Neuro: Alert and oriented. Skin: No rash. Port-A-Cath without erythema.   Lab Results:  Lab Results  Component Value Date   WBC 11.1 (H) 11/08/2022   HGB 8.6 (L) 11/08/2022   HCT 26.3 (L) 11/08/2022   MCV 110.0 (H) 11/08/2022   PLT 111 (L) 11/08/2022   NEUTROABS 9.9 (H) 11/08/2022    Imaging:  No results found.  Medications: I have reviewed the patient's current medications.  Assessment/Plan: Pancreas cancer, status post a pancreaticoduodenectomy 11/12/2021, stage IIb (pT1,pN1) 1/11 lymph nodes, perineural invasion positive, positive margin at the SMA and SMV MRCP 09/27/2021-diffuse intrahepatic and common bile duct dilatation with abrupt termination at the level of the head of the pancreas, indistinct area of hypoenhancement in the pancreas head ERCP with placement of pancreatic duct and bile duct stents 10/04/2021 EUS 1 10/04/2021-no pancreas mass identified, FNA of pancreas head-"atypical "cells, no  adenopathy CT abdomen/pelvis 10/18/2021-no focal liver abnormality, intra and extrahepatic biliary ductal dilatation with narrowing of the common duct and the pancreas head, no enlarged abdominal lymph nodes Cycle 1 FOLFIRINOX 12/27/2021, irinotecan held with cycle 1 Cycle 2 FOLFIRINOX 01/24/2022, oxaliplatin and irinotecan dose reduced secondary to elevated liver enzymes, Udenyca Cycle 3 FOLFIRINOX 02/07/2022, same doses as cycle 2 Cycle 4 FOLFIRINOX 02/21/2022 Cycle 5 FOLFIRINOX 03/07/2022 Cycle 6 FOLFIRINOX 03/22/2022 Cycle 7 FOLFIRINOX 04/05/2022 Cycle 8 FOLFIRINOX 04/19/2022 Radiation/Xeloda 05/09/2022-06/16/2022 Paracentesis 09/06/2022-reactive mesothelial cells CTs 09/08/2022-multiple new right lobe liver lesions, ill-defined soft tissue pancreatic anastomosis, moderate volume ascites Ultrasound-guided biopsy of a right liver lesion 09/15/2022-moderate to poorly differentiated adenocarcinoma consistent with pancreas cancer; foundation 1-microsatellite stable, tumor mutation burden 2, K-ras G12D Cycle 1 gemcitabine/Abraxane 10/04/2022 Cycle 2 gemcitabine/Abraxane 10/25/2022, chemotherapy dose reduced due to neutropenia and thrombocytopenia, Udenyca Cycle 3 gemcitabine/Abraxane 11/08/2022, Udenyca Acute pancreatitis 10/04/2021 Bipolar disorder Left soleal DVT 11/26/2021-apixaban, discontinued 07/14/2022 Port-A-Cath placement 12/22/2021  Atrial fibrillation 12/22/2021, converted to sinus rhythm, discharged home 12/23/2021 on metoprolol 12.5 mg twice daily, on Eliquis for prior DVT. Neutropenia secondary to chemotherapy 01/10/2022, Udenyca added with cycle 2 Elevated liver enzymes, predating chemotherapy Neutropenia/progressive thrombocytopenia following cycle 1 gemcitabine/Abraxane-Udenyca added, chemotherapy dose reduced    Disposition: Mr. Levick appears unchanged.  He has completed 2 cycles of gemcitabine/Abraxane.  Plan to proceed with cycle 3 today as scheduled.  Udenyca day 2.  CBC and chemistry  panel reviewed.  Labs adequate to proceed as above.  He will return for follow-up and cycle 4 gemcitabine/Abraxane in 2 weeks.  We are available to see him sooner if needed.    Ned Card ANP/GNP-BC   11/08/2022  9:44 AM

## 2022-11-08 NOTE — Progress Notes (Signed)
[facility] CSW Progress Note  Holiday representative met with patient to assess psychosocial needs.  He was receiving infusion and expressed being comfortable.  Discussed how he and his wife, [name], are adjusting to patient's illness.  He said they take it a day at a time.  Explored recording his life story or message to his family.  He is working with the [employer] to obtain his disability.  CSW provided active listening and supportive counseling.  Patient expressed no needs at this time.    [name] Manson [place], LCSW

## 2022-11-09 ENCOUNTER — Telehealth: Payer: Self-pay | Admitting: *Deleted

## 2022-11-09 ENCOUNTER — Ambulatory Visit (HOSPITAL_COMMUNITY)
Admission: RE | Admit: 2022-11-09 | Discharge: 2022-11-09 | Disposition: A | Discharge status: Home / Self Care | Payer: BC Managed Care – PPO | Source: Ambulatory Visit | Attending: Oncology | Admitting: Oncology

## 2022-11-09 ENCOUNTER — Inpatient Hospital Stay: Payer: BC Managed Care – PPO

## 2022-11-09 VITALS — BP 102/66 | HR 75 | Temp 97.6°F | Resp 17

## 2022-11-09 DIAGNOSIS — R140 Abdominal distension (gaseous): Secondary | ICD-10-CM | POA: Diagnosis not present

## 2022-11-09 DIAGNOSIS — C259 Malignant neoplasm of pancreas, unspecified: Secondary | ICD-10-CM | POA: Diagnosis not present

## 2022-11-09 DIAGNOSIS — C250 Malignant neoplasm of head of pancreas: Secondary | ICD-10-CM | POA: Insufficient documentation

## 2022-11-09 DIAGNOSIS — Z7901 Long term (current) use of anticoagulants: Secondary | ICD-10-CM | POA: Diagnosis not present

## 2022-11-09 DIAGNOSIS — Z86718 Personal history of other venous thrombosis and embolism: Secondary | ICD-10-CM | POA: Diagnosis not present

## 2022-11-09 DIAGNOSIS — K859 Acute pancreatitis without necrosis or infection, unspecified: Secondary | ICD-10-CM | POA: Diagnosis not present

## 2022-11-09 DIAGNOSIS — F319 Bipolar disorder, unspecified: Secondary | ICD-10-CM | POA: Diagnosis not present

## 2022-11-09 DIAGNOSIS — Z5111 Encounter for antineoplastic chemotherapy: Secondary | ICD-10-CM | POA: Diagnosis not present

## 2022-11-09 DIAGNOSIS — I4891 Unspecified atrial fibrillation: Secondary | ICD-10-CM | POA: Diagnosis not present

## 2022-11-09 DIAGNOSIS — Z923 Personal history of irradiation: Secondary | ICD-10-CM | POA: Diagnosis not present

## 2022-11-09 DIAGNOSIS — I82462 Acute embolism and thrombosis of left calf muscular vein: Secondary | ICD-10-CM | POA: Diagnosis not present

## 2022-11-09 DIAGNOSIS — Z79899 Other long term (current) drug therapy: Secondary | ICD-10-CM | POA: Diagnosis not present

## 2022-11-09 DIAGNOSIS — R188 Other ascites: Secondary | ICD-10-CM | POA: Diagnosis not present

## 2022-11-09 DIAGNOSIS — D701 Agranulocytosis secondary to cancer chemotherapy: Secondary | ICD-10-CM | POA: Diagnosis not present

## 2022-11-09 DIAGNOSIS — D696 Thrombocytopenia, unspecified: Secondary | ICD-10-CM | POA: Diagnosis not present

## 2022-11-09 DIAGNOSIS — Z5189 Encounter for other specified aftercare: Secondary | ICD-10-CM | POA: Diagnosis not present

## 2022-11-09 DIAGNOSIS — T451X5A Adverse effect of antineoplastic and immunosuppressive drugs, initial encounter: Secondary | ICD-10-CM | POA: Diagnosis not present

## 2022-11-09 LAB — CANCER ANTIGEN 19-9: CA 19-9: 2877 U/mL — ABNORMAL HIGH (ref 0–35)

## 2022-11-09 MED ORDER — PEGFILGRASTIM-CBQV 6 MG/0.6ML ~~LOC~~ SOSY
6.0000 mg | PREFILLED_SYRINGE | Freq: Once | SUBCUTANEOUS | Status: AC
  Administered 2022-11-09: 6 mg via SUBCUTANEOUS
  Filled 2022-11-09: qty 0.6

## 2022-11-09 MED ORDER — LIDOCAINE HCL 1 % IJ SOLN
INTRAMUSCULAR | Status: AC
  Filled 2022-11-09: qty 10

## 2022-11-09 MED ORDER — LIDOCAINE HCL 1 % IJ SOLN
INTRAMUSCULAR | Status: AC
  Administered 2022-11-09: 10 mL
  Filled 2022-11-09: qty 20

## 2022-11-09 NOTE — Patient Instructions (Signed)

## 2022-11-09 NOTE — Procedures (Signed)
PROCEDURE SUMMARY:  Successful US guided paracentesis from right abdomen.  Yielded 4 L of yellow fluid.  No immediate complications.  Pt tolerated well.   Specimen not sent for labs.  EBL < 2 mL  Theresa Duty, NP 11/09/2022 3:32 PM

## 2022-11-09 NOTE — Telephone Encounter (Signed)
Dean [name] called requesting MD input on the CA 19-9 result of 11/08/22. Informed her that per MD, it is too early for this to be a true indicator of response or progression (has only had #3 treatments). Plan is to do CT scan after 5 cycles of chemo. She understands and agrees to this plan.

## 2022-11-11 LAB — CYTOLOGY - NON PAP

## 2022-11-14 ENCOUNTER — Inpatient Hospital Stay: Payer: BC Managed Care – PPO

## 2022-11-14 ENCOUNTER — Other Ambulatory Visit: Payer: BC Managed Care – PPO

## 2022-11-14 ENCOUNTER — Ambulatory Visit: Payer: BC Managed Care – PPO

## 2022-11-14 ENCOUNTER — Ambulatory Visit: Payer: BC Managed Care – PPO | Admitting: Oncology

## 2022-11-15 ENCOUNTER — Encounter: Payer: Self-pay | Admitting: Nurse Practitioner

## 2022-11-15 ENCOUNTER — Other Ambulatory Visit: Payer: Self-pay | Admitting: Nurse Practitioner

## 2022-11-15 ENCOUNTER — Other Ambulatory Visit: Payer: Self-pay

## 2022-11-15 DIAGNOSIS — C250 Malignant neoplasm of head of pancreas: Secondary | ICD-10-CM

## 2022-11-15 MED ORDER — OXYCODONE-ACETAMINOPHEN 5-325 MG PO TABS: 1.0000 | ORAL_TABLET | ORAL | 0 refills | Status: DC | PRN

## 2022-11-17 ENCOUNTER — Ambulatory Visit (HOSPITAL_COMMUNITY)
Admission: RE | Admit: 2022-11-17 | Discharge: 2022-11-17 | Disposition: A | Discharge status: Home / Self Care | Payer: BC Managed Care – PPO | Source: Ambulatory Visit | Attending: Oncology | Admitting: Oncology

## 2022-11-17 DIAGNOSIS — C259 Malignant neoplasm of pancreas, unspecified: Secondary | ICD-10-CM | POA: Diagnosis not present

## 2022-11-17 DIAGNOSIS — R188 Other ascites: Secondary | ICD-10-CM | POA: Insufficient documentation

## 2022-11-17 DIAGNOSIS — C250 Malignant neoplasm of head of pancreas: Secondary | ICD-10-CM | POA: Diagnosis not present

## 2022-11-17 HISTORY — PX: IR PARACENTESIS: IMG2679

## 2022-11-17 MED ORDER — LIDOCAINE HCL 1 % IJ SOLN
INTRAMUSCULAR | Status: AC
  Filled 2022-11-17: qty 20

## 2022-11-17 NOTE — Procedures (Signed)
PROCEDURE SUMMARY:  Successful image-guided paracentesis from the right lower abdomen.  Yielded 5 liters of hazy yellow fluid.  No immediate complications.  EBL = trace. Patient tolerated well.   Specimen was not sent for labs.  Please see imaging section of Epic for full dictation.   Armando Gang Jaymason Ledesma PA-C 11/17/2022 1:35 PM

## 2022-11-21 ENCOUNTER — Other Ambulatory Visit: Payer: Self-pay | Admitting: Nurse Practitioner

## 2022-11-21 ENCOUNTER — Encounter: Payer: Self-pay | Admitting: Nurse Practitioner

## 2022-11-21 DIAGNOSIS — C250 Malignant neoplasm of head of pancreas: Secondary | ICD-10-CM

## 2022-11-22 ENCOUNTER — Inpatient Hospital Stay: Payer: BC Managed Care – PPO

## 2022-11-22 ENCOUNTER — Inpatient Hospital Stay (HOSPITAL_BASED_OUTPATIENT_CLINIC_OR_DEPARTMENT_OTHER): Payer: BC Managed Care – PPO | Admitting: Oncology

## 2022-11-22 ENCOUNTER — Other Ambulatory Visit: Payer: Self-pay | Admitting: *Deleted

## 2022-11-22 ENCOUNTER — Encounter: Payer: Self-pay | Admitting: Oncology

## 2022-11-22 ENCOUNTER — Encounter: Payer: Self-pay | Admitting: *Deleted

## 2022-11-22 VITALS — BP 103/72 | HR 66 | Temp 98.1°F | Resp 20 | Ht 71.0 in | Wt 147.2 lb

## 2022-11-22 VITALS — BP 104/69 | HR 62

## 2022-11-22 DIAGNOSIS — C250 Malignant neoplasm of head of pancreas: Secondary | ICD-10-CM

## 2022-11-22 DIAGNOSIS — K859 Acute pancreatitis without necrosis or infection, unspecified: Secondary | ICD-10-CM | POA: Diagnosis not present

## 2022-11-22 DIAGNOSIS — T451X5A Adverse effect of antineoplastic and immunosuppressive drugs, initial encounter: Secondary | ICD-10-CM | POA: Diagnosis not present

## 2022-11-22 DIAGNOSIS — Z7901 Long term (current) use of anticoagulants: Secondary | ICD-10-CM | POA: Diagnosis not present

## 2022-11-22 DIAGNOSIS — I82462 Acute embolism and thrombosis of left calf muscular vein: Secondary | ICD-10-CM | POA: Diagnosis not present

## 2022-11-22 DIAGNOSIS — Z5111 Encounter for antineoplastic chemotherapy: Secondary | ICD-10-CM | POA: Diagnosis not present

## 2022-11-22 DIAGNOSIS — D696 Thrombocytopenia, unspecified: Secondary | ICD-10-CM | POA: Diagnosis not present

## 2022-11-22 DIAGNOSIS — Z5189 Encounter for other specified aftercare: Secondary | ICD-10-CM | POA: Diagnosis not present

## 2022-11-22 DIAGNOSIS — R140 Abdominal distension (gaseous): Secondary | ICD-10-CM | POA: Diagnosis not present

## 2022-11-22 DIAGNOSIS — I4891 Unspecified atrial fibrillation: Secondary | ICD-10-CM | POA: Diagnosis not present

## 2022-11-22 DIAGNOSIS — F319 Bipolar disorder, unspecified: Secondary | ICD-10-CM | POA: Diagnosis not present

## 2022-11-22 DIAGNOSIS — D701 Agranulocytosis secondary to cancer chemotherapy: Secondary | ICD-10-CM | POA: Diagnosis not present

## 2022-11-22 DIAGNOSIS — Z923 Personal history of irradiation: Secondary | ICD-10-CM | POA: Diagnosis not present

## 2022-11-22 DIAGNOSIS — Z79899 Other long term (current) drug therapy: Secondary | ICD-10-CM | POA: Diagnosis not present

## 2022-11-22 DIAGNOSIS — Z86718 Personal history of other venous thrombosis and embolism: Secondary | ICD-10-CM | POA: Diagnosis not present

## 2022-11-22 LAB — CBC WITH DIFFERENTIAL (CANCER CENTER ONLY)
Abs Immature Granulocytes: 0.16 10*3/uL — ABNORMAL HIGH (ref 0.00–0.07)
Basophils Absolute: 0 10*3/uL (ref 0.0–0.1)
Basophils Relative: 0 %
Eosinophils Absolute: 0 10*3/uL (ref 0.0–0.5)
Eosinophils Relative: 0 %
HCT: 28.2 % — ABNORMAL LOW (ref 39.0–52.0)
Hemoglobin: 9.2 g/dL — ABNORMAL LOW (ref 13.0–17.0)
Immature Granulocytes: 1 %
Lymphocytes Relative: 4 %
Lymphs Abs: 0.6 10*3/uL — ABNORMAL LOW (ref 0.7–4.0)
MCH: 36.1 pg — ABNORMAL HIGH (ref 26.0–34.0)
MCHC: 32.6 g/dL (ref 30.0–36.0)
MCV: 110.6 fL — ABNORMAL HIGH (ref 80.0–100.0)
Monocytes Absolute: 0.8 10*3/uL (ref 0.1–1.0)
Monocytes Relative: 5 %
Neutro Abs: 15 10*3/uL — ABNORMAL HIGH (ref 1.7–7.7)
Neutrophils Relative %: 90 %
Platelet Count: 195 10*3/uL (ref 150–400)
RBC: 2.55 MIL/uL — ABNORMAL LOW (ref 4.22–5.81)
RDW: 16.9 % — ABNORMAL HIGH (ref 11.5–15.5)
WBC Count: 16.6 10*3/uL — ABNORMAL HIGH (ref 4.0–10.5)
nRBC: 0 % (ref 0.0–0.2)

## 2022-11-22 LAB — CMP (CANCER CENTER ONLY)
ALT: 20 U/L (ref 0–44)
AST: 29 U/L (ref 15–41)
Albumin: 2.9 g/dL — ABNORMAL LOW (ref 3.5–5.0)
Alkaline Phosphatase: 542 U/L — ABNORMAL HIGH (ref 38–126)
Anion gap: 7 (ref 5–15)
BUN: 17 mg/dL (ref 6–20)
CO2: 28 mmol/L (ref 22–32)
Calcium: 8.5 mg/dL — ABNORMAL LOW (ref 8.9–10.3)
Chloride: 97 mmol/L — ABNORMAL LOW (ref 98–111)
Creatinine: 0.63 mg/dL (ref 0.61–1.24)
GFR, Estimated: >60 mL/min (ref >60)
Glucose, Bld: 100 mg/dL — ABNORMAL HIGH (ref 70–99)
Potassium: 3.9 mmol/L (ref 3.5–5.1)
Sodium: 132 mmol/L — ABNORMAL LOW (ref 135–145)
Total Bilirubin: 0.6 mg/dL (ref 0.3–1.2)
Total Protein: 6 g/dL — ABNORMAL LOW (ref 6.5–8.1)

## 2022-11-22 MED ORDER — PROCHLORPERAZINE MALEATE 10 MG PO TABS
10.0000 mg | ORAL_TABLET | Freq: Once | ORAL | Status: AC
  Administered 2022-11-22: 10 mg via ORAL
  Filled 2022-11-22: qty 1

## 2022-11-22 MED ORDER — SODIUM CHLORIDE 0.9% FLUSH
10.0000 mL | INTRAVENOUS | Status: DC | PRN
  Administered 2022-11-22: 10 mL

## 2022-11-22 MED ORDER — MORPHINE SULFATE ER 15 MG PO TBCR: 15.0000 mg | EXTENDED_RELEASE_TABLET | Freq: Two times a day (BID) | ORAL | 0 refills | Status: DC

## 2022-11-22 MED ORDER — SODIUM CHLORIDE 0.9 % IV SOLN: Freq: Once | INTRAVENOUS | Status: AC

## 2022-11-22 MED ORDER — SODIUM CHLORIDE 0.9 % IV SOLN
600.0000 mg/m2 | Freq: Once | INTRAVENOUS | Status: AC
  Administered 2022-11-22: 1140 mg via INTRAVENOUS
  Filled 2022-11-22: qty 5.26

## 2022-11-22 MED ORDER — OXYCODONE HCL 5 MG PO TABS: 10.0000 mg | ORAL_TABLET | ORAL | 0 refills | Status: DC | PRN

## 2022-11-22 MED ORDER — PACLITAXEL PROTEIN-BOUND CHEMO INJECTION 100 MG
75.0000 mg/m2 | Freq: Once | INTRAVENOUS | Status: AC
  Administered 2022-11-22: 150 mg via INTRAVENOUS
  Filled 2022-11-22: qty 30

## 2022-11-22 MED ORDER — HEPARIN SOD (PORK) LOCK FLUSH 100 UNIT/ML IV SOLN
500.0000 [IU] | Freq: Once | INTRAVENOUS | Status: AC | PRN
  Administered 2022-11-22: 500 [IU]

## 2022-11-22 NOTE — Patient Instructions (Signed)
[facility]   Discharge Instructions: Thank you for choosing [facility] to provide your oncology and hematology care.   If you have a lab appointment with the Cancer Center, please go directly to the Cancer Center and check in at the registration area.   Wear comfortable clothing and clothing appropriate for easy access to any Portacath or PICC line.   We strive to give you quality time with your provider. You may need to reschedule your appointment if you arrive late (15 or more minutes).  Arriving late affects you and other patients whose appointments are after yours.  Also, if you miss three or more appointments without notifying the office, you may be dismissed from the clinic at the provider's discretion.      For prescription refill requests, have your pharmacy contact our office and allow 72 hours for refills to be completed.    Today you received the following chemotherapy and/or immunotherapy agents Abraxane, Gemzar      To help prevent nausea and vomiting after your treatment, we encourage you to take your nausea medication as directed.  BELOW ARE SYMPTOMS THAT SHOULD BE REPORTED IMMEDIATELY: *FEVER GREATER THAN 100.4 F (38 C) OR HIGHER *CHILLS OR SWEATING *NAUSEA AND VOMITING THAT IS NOT CONTROLLED WITH YOUR NAUSEA MEDICATION *UNUSUAL SHORTNESS OF BREATH *UNUSUAL BRUISING OR BLEEDING *URINARY PROBLEMS (pain or burning when urinating, or frequent urination) *BOWEL PROBLEMS (unusual diarrhea, constipation, pain near the anus) TENDERNESS IN MOUTH AND THROAT WITH OR WITHOUT PRESENCE OF ULCERS (sore throat, sores in mouth, or a toothache) UNUSUAL RASH, SWELLING OR PAIN  UNUSUAL VAGINAL DISCHARGE OR ITCHING   Items with * indicate a potential emergency and should be followed up as soon as possible or go to the Emergency Department if any problems should occur.  Please show the CHEMOTHERAPY ALERT CARD or IMMUNOTHERAPY ALERT CARD at  check-in to the Emergency Department and triage nurse.  Should you have questions after your visit or need to cancel or reschedule your appointment, please contact [facility]  Dept: [phone]  and follow the prompts.  Office hours are 8:00 a.m. to 4:30 p.m. Monday - Friday. Please note that voicemails left after 4:00 p.m. may not be returned until the following business day.  We are closed weekends and major holidays. You have access to a nurse at all times for urgent questions. Please call the main number to the clinic Dept: [phone] and follow the prompts.   For any non-urgent questions, you may also contact your provider using MyChart. We now offer e-Visits for anyone 18 and older to request care online for non-urgent symptoms. For details visit [email or web address].   Also download the MyChart app! Go to the app store, search "MyChart", open the app, select [facility], and log in with your MyChart username and password.  Masks are optional in the cancer centers. If you would like for your care team to wear a mask while they are taking care of you, please let them know. You may have one support person who is at least 56 years old accompany you for your appointments.  Paclitaxel Nanoparticle Albumin-Bound Injection What is this medication? NANOPARTICLE ALBUMIN-BOUND PACLITAXEL (Na no PAHR ti kuhl al BYOO muhn-bound PAK li TAX el) treats some types of cancer. It works by slowing down the growth of cancer cells. This medicine may be used for other purposes; ask your health care provider or pharmacist if you have questions. COMMON BRAND   NAME(S): Abraxane What should I tell my care team before I take this medication? They need to know if you have any of these conditions: Liver disease Low white blood cell levels An unusual or allergic reaction to paclitaxel, albumin, other medications, foods, dyes, or preservatives If you or your partner are pregnant or  trying to get pregnant Breast-feeding How should I use this medication? This medication is injected into a vein. It is given by your care team in a hospital or clinic setting. Talk to your care team about the use of this medication in children. Special care may be needed. Overdosage: If you think you have taken too much of this medicine contact a poison control center or emergency room at once. NOTE: This medicine is only for you. Do not share this medicine with others. What if I miss a dose? Keep appointments for follow-up doses. It is important not to miss your dose. Call your care team if you are unable to keep an appointment. What may interact with this medication? Other medications may affect the way this medication works. Talk with your care team about all of the medications you take. They may suggest changes to your treatment plan to lower the risk of side effects and to make sure your medications work as intended. This list may not describe all possible interactions. Give your health care provider a list of all the medicines, herbs, non-prescription drugs, or dietary supplements you use. Also tell them if you smoke, drink alcohol, or use illegal drugs. Some items may interact with your medicine. What should I watch for while using this medication? Your condition will be monitored carefully while you are receiving this medication. You may need blood work while taking this medication. This medication may make you feel generally unwell. This is not uncommon as chemotherapy can affect healthy cells as well as cancer cells. Report any side effects. Continue your course of treatment even though you feel ill unless your care team tells you to stop. This medication can cause serious allergic reactions. To reduce the risk, your care team may give you other medications to take before receiving this one. Be sure to follow the directions from your care team. This medication may increase your risk of  getting an infection. Call your care team for advice if you get a fever, chills, sore throat, or other symptoms of a cold or flu. Do not treat yourself. Try to avoid being around people who are sick. This medication may increase your risk to bruise or bleed. Call your care team if you notice any unusual bleeding. Be careful brushing or flossing your teeth or using a toothpick because you may get an infection or bleed more easily. If you have any dental work done, tell your dentist you are receiving this medication. Talk to your care team if you or your partner may be pregnant. Serious birth defects can occur if you take this medication during pregnancy and for 6 months after the last dose. You will need a negative pregnancy test before starting this medication. Contraception is recommended while taking this medication and for 6 months after the last dose. Your care team can help you find the option that works for you. If your partner can get pregnant, use a condom during sex while taking this medication and for 3 months after the last dose. Do not breastfeed while taking this medication and for 2 weeks after the last dose. This medication may cause infertility. Talk to your care team   if you are concerned about your fertility. What side effects may I notice from receiving this medication? Side effects that you should report to your care team as soon as possible: Allergic reactions--skin rash, itching, hives, swelling of the face, lips, tongue, or throat Dry cough, shortness of breath or trouble breathing Infection--fever, chills, cough, sore throat, wounds that don't heal, pain or trouble when passing urine, general feeling of discomfort or being unwell Low red blood cell level--unusual weakness or fatigue, dizziness, headache, trouble breathing Pain, tingling, or numbness in the hands or feet Stomach pain, unusual weakness or fatigue, nausea, vomiting, diarrhea, or fever that lasts longer than  expected Unusual bruising or bleeding Side effects that usually do not require medical attention (report to your care team if they continue or are bothersome): Diarrhea Fatigue Hair loss Loss of appetite Nausea Vomiting This list may not describe all possible side effects. Call your doctor for medical advice about side effects. You may report side effects to FDA at 1-800-FDA-1088. Where should I keep my medication? This medication is given in a hospital or clinic. It will not be stored at home. NOTE: This sheet is a summary. It may not cover all possible information. If you have questions about this medicine, talk to your doctor, pharmacist, or health care provider.  2023 Elsevier/Gold Standard (2008-01-12 00:00:00)  Gemcitabine Injection What is this medication? GEMCITABINE (jem SYE ta been) treats some types of cancer. It works by slowing down the growth of cancer cells. This medicine may be used for other purposes; ask your health care provider or pharmacist if you have questions. COMMON BRAND NAME(S): Gemzar, Infugem What should I tell my care team before I take this medication? They need to know if you have any of these conditions: Blood disorders Infection Kidney disease Liver disease Lung or breathing disease, such as asthma or COPD Recent or ongoing radiation therapy An unusual or allergic reaction to gemcitabine, other medications, foods, dyes, or preservatives If you or your partner are pregnant or trying to get pregnant Breast-feeding How should I use this medication? This medication is injected into a vein. It is given by your care team in a hospital or clinic setting. Talk to your care team about the use of this medication in children. Special care may be needed. Overdosage: If you think you have taken too much of this medicine contact a poison control center or emergency room at once. NOTE: This medicine is only for you. Do not share this medicine with others. What  if I miss a dose? Keep appointments for follow-up doses. It is important not to miss your dose. Call your care team if you are unable to keep an appointment. What may interact with this medication? Interactions have not been studied. This list may not describe all possible interactions. Give your health care provider a list of all the medicines, herbs, non-prescription drugs, or dietary supplements you use. Also tell them if you smoke, drink alcohol, or use illegal drugs. Some items may interact with your medicine. What should I watch for while using this medication? Your condition will be monitored carefully while you are receiving this medication. This medication may make you feel generally unwell. This is not uncommon, as chemotherapy can affect healthy cells as well as cancer cells. Report any side effects. Continue your course of treatment even though you feel ill unless your care team tells you to stop. In some cases, you may be given additional medications to help with side effects. Follow  all directions for their use. This medication may increase your risk of getting an infection. Call your care team for advice if you get a fever, chills, sore throat, or other symptoms of a cold or flu. Do not treat yourself. Try to avoid being around people who are sick. This medication may increase your risk to bruise or bleed. Call your care team if you notice any unusual bleeding. Be careful brushing or flossing your teeth or using a toothpick because you may get an infection or bleed more easily. If you have any dental work done, tell your dentist you are receiving this medication. Avoid taking medications that contain aspirin, acetaminophen, ibuprofen, naproxen, or ketoprofen unless instructed by your care team. These medications may hide a fever. Talk to your care team if you or your partner wish to become pregnant or think you might be pregnant. This medication can cause serious birth defects if taken  during pregnancy and for 6 months after the last dose. A negative pregnancy test is required before starting this medication. A reliable form of contraception is recommended while taking this medication and for 6 months after the last dose. Talk to your care team about effective forms of contraception. Do not father a child while taking this medication and for 3 months after the last dose. Use a condom while having sex during this time period. Do not breastfeed while taking this medication and for at least 1 week after the last dose. This medication may cause infertility. Talk to your care team if you are concerned about your fertility. What side effects may I notice from receiving this medication? Side effects that you should report to your care team as soon as possible: Allergic reactions--skin rash, itching, hives, swelling of the face, lips, tongue, or throat Capillary leak syndrome--stomach or muscle pain, unusual weakness or fatigue, feeling faint or lightheaded, decrease in the amount of urine, swelling of the ankles, hands, or feet, trouble breathing Infection--fever, chills, cough, sore throat, wounds that don't heal, pain or trouble when passing urine, general feeling of discomfort or being unwell Liver injury--right upper belly pain, loss of appetite, nausea, light-colored stool, dark yellow or brown urine, yellowing skin or eyes, unusual weakness or fatigue Low red blood cell level--unusual weakness or fatigue, dizziness, headache, trouble breathing Lung injury--shortness of breath or trouble breathing, cough, spitting up blood, chest pain, fever Stomach pain, bloody diarrhea, pale skin, unusual weakness or fatigue, decrease in the amount of urine, which may be signs of hemolytic uremic syndrome Sudden and severe headache, confusion, change in vision, seizures, which may be signs of posterior reversible encephalopathy syndrome (PRES) Unusual bruising or bleeding Side effects that usually do  not require medical attention (report to your care team if they continue or are bothersome): Diarrhea Drowsiness Hair loss Nausea Pain, redness, or swelling with sores inside the mouth or throat Vomiting This list may not describe all possible side effects. Call your doctor for medical advice about side effects. You may report side effects to FDA at 1-800-FDA-1088. Where should I keep my medication? This medication is given in a hospital or clinic. It will not be stored at home. NOTE: This sheet is a summary. It may not cover all possible information. If you have questions about this medicine, talk to your doctor, pharmacist, or health care provider.  2023 Elsevier/Gold Standard (2008-01-12 00:00:00)

## 2022-11-22 NOTE — Progress Notes (Signed)
Patient seen by Dr. Benay Spice today  Vitals are within treatment parameters.  Labs reviewed by Dr. Benay Spice and are within treatment parameters.  Per physician team, patient is ready for treatment. Please note that modifications are being made to the treatment plan including Dose escalation of Gemzar and Abraxane.

## 2022-11-22 NOTE — Progress Notes (Signed)
[facility] OFFICE PROGRESS NOTE   Diagnosis: Pancreas cancer  INTERVAL HISTORY:   Dean [name] complete another cycle of gemcitabine/Abraxane on 11/08/2022.  No rash, nausea, or fever.  He continues to have cold sensitivity.  No new neuropathy symptoms.  He complains of abdominal pain.  The pain is partially relieved with oxycodone.  He takes 2 oxycodones every 4 hours.  He has constipation. He last underwent a paracentesis for 5 L on 11/17/2022.  He feels better after each paracentesis procedure.  No pain following the CSF.  Objective:  Vital signs in last 24 hours:  Blood pressure 103/72, pulse 66, temperature 98.1 F (36.7 C), temperature source Oral, resp. rate 20, height _0  (1.803 m), weight 147 lb 3.2 oz (66.8 kg), SpO2 99 %.    HEENT: No thrush or ulcers Resp: Lungs clear bilaterally Cardio: Regular rate and rhythm GI: Distended with ascites, no mass Vascular: Pitting edema below the knee bilaterally     Portacath/PICC-without erythema  Lab Results:  Lab Results  Component Value Date   WBC 16.6 (H) 11/22/2022   HGB 9.2 (L) 11/22/2022   HCT 28.2 (L) 11/22/2022   MCV 110.6 (H) 11/22/2022   PLT 195 11/22/2022   NEUTROABS 15.0 (H) 11/22/2022    CMP  Lab Results  Component Value Date   NA 132 (L) 11/22/2022   K 3.9 11/22/2022   CL 97 (L) 11/22/2022   CO2 28 11/22/2022   GLUCOSE 100 (H) 11/22/2022   BUN 17 11/22/2022   CREATININE 0.63 11/22/2022   CALCIUM 8.5 (L) 11/22/2022   PROT 6.0 (L) 11/22/2022   ALBUMIN 2.9 (L) 11/22/2022   AST 29 11/22/2022   ALT 20 11/22/2022   ALKPHOS 542 (H) 11/22/2022   BILITOT 0.6 11/22/2022   GFRNONAA >60 11/22/2022   GFRAA >60 08/19/2015    Lab Results  Component Value Date   YSA630 2,877 (H) 11/08/2022    Medications: I have reviewed the patient's current medications.   Assessment/Plan: Pancreas cancer, status post a pancreaticoduodenectomy 11/12/2021, stage IIb (pT1,pN1) 1/11 lymph nodes,  perineural invasion positive, positive margin at the SMA and SMV MRCP 09/27/2021-diffuse intrahepatic and common bile duct dilatation with abrupt termination at the level of the head of the pancreas, indistinct area of hypoenhancement in the pancreas head ERCP with placement of pancreatic duct and bile duct stents 10/04/2021 EUS 1 10/04/2021-no pancreas mass identified, FNA of pancreas head-"atypical "cells, no adenopathy CT abdomen/pelvis 10/18/2021-no focal liver abnormality, intra and extrahepatic biliary ductal dilatation with narrowing of the common duct and the pancreas head, no enlarged abdominal lymph nodes Cycle 1 FOLFIRINOX 12/27/2021, irinotecan held with cycle 1 Cycle 2 FOLFIRINOX 01/24/2022, oxaliplatin and irinotecan dose reduced secondary to elevated liver enzymes, Udenyca Cycle 3 FOLFIRINOX 02/07/2022, same doses as cycle 2 Cycle 4 FOLFIRINOX 02/21/2022 Cycle 5 FOLFIRINOX 03/07/2022 Cycle 6 FOLFIRINOX 03/22/2022 Cycle 7 FOLFIRINOX 04/05/2022 Cycle 8 FOLFIRINOX 04/19/2022 Radiation/Xeloda 05/09/2022-06/16/2022 Paracentesis 09/06/2022-reactive mesothelial cells CTs 09/08/2022-multiple new right lobe liver lesions, ill-defined soft tissue pancreatic anastomosis, moderate volume ascites Ultrasound-guided biopsy of a right liver lesion 09/15/2022-moderate to poorly differentiated adenocarcinoma consistent with pancreas cancer; foundation 1-microsatellite stable, tumor mutation burden 2, K-ras G12D Cycle 1 gemcitabine/Abraxane 10/04/2022 Cycle 2 gemcitabine/Abraxane 10/25/2022, chemotherapy dose reduced due to neutropenia and thrombocytopenia, Udenyca Cycle 3 gemcitabine/Abraxane 11/08/2022, Udenyca Cycle 4 gemcitabine/Abraxane 11/22/2022, Udenyca, gemcitabine and Abraxane doses escalated Acute pancreatitis 10/04/2021 Bipolar disorder Left soleal DVT 11/26/2021-apixaban, discontinued 07/14/2022 Port-A-Cath placement 12/22/2021  Atrial fibrillation 12/22/2021, converted to sinus rhythm, discharged  home  12/23/2021 on metoprolol 12.5 mg twice daily, on Eliquis for prior DVT. Neutropenia secondary to chemotherapy 01/10/2022, Udenyca added with cycle 2 Elevated liver enzymes, predating chemotherapy Neutropenia/progressive thrombocytopenia following cycle 1 gemcitabine/Abraxane-Udenyca added, chemotherapy dose reduced      Disposition: Dean Covault has metastatic pancreas cancer.  He is symptomatic with refractory ascites and pain.  He will increase the oxycodone to 3 tablets every 4 hours as needed.  He will begin MS Contin.  He will MiraLAX daily for constipation.  The white count and platelets have increased.  We increased the gemcitabine and Abraxane doses today.  We will follow-up on the CA 19-9 from today.  He will return for an office visit and chemotherapy in 2 weeks.  He will be referred for a restaging CT after the next cycle of chemotherapy.  Betsy Coder, MD  11/22/2022  9:28 AM

## 2022-11-22 NOTE — Patient Instructions (Signed)

## 2022-11-23 ENCOUNTER — Inpatient Hospital Stay: Payer: BC Managed Care – PPO

## 2022-11-23 VITALS — BP 102/70 | HR 75 | Temp 98.8°F | Resp 20

## 2022-11-23 DIAGNOSIS — T451X5A Adverse effect of antineoplastic and immunosuppressive drugs, initial encounter: Secondary | ICD-10-CM | POA: Diagnosis not present

## 2022-11-23 DIAGNOSIS — Z79899 Other long term (current) drug therapy: Secondary | ICD-10-CM | POA: Diagnosis not present

## 2022-11-23 DIAGNOSIS — R140 Abdominal distension (gaseous): Secondary | ICD-10-CM | POA: Diagnosis not present

## 2022-11-23 DIAGNOSIS — D701 Agranulocytosis secondary to cancer chemotherapy: Secondary | ICD-10-CM | POA: Diagnosis not present

## 2022-11-23 DIAGNOSIS — D696 Thrombocytopenia, unspecified: Secondary | ICD-10-CM | POA: Diagnosis not present

## 2022-11-23 DIAGNOSIS — I4891 Unspecified atrial fibrillation: Secondary | ICD-10-CM | POA: Diagnosis not present

## 2022-11-23 DIAGNOSIS — Z923 Personal history of irradiation: Secondary | ICD-10-CM | POA: Diagnosis not present

## 2022-11-23 DIAGNOSIS — F319 Bipolar disorder, unspecified: Secondary | ICD-10-CM | POA: Diagnosis not present

## 2022-11-23 DIAGNOSIS — Z86718 Personal history of other venous thrombosis and embolism: Secondary | ICD-10-CM | POA: Diagnosis not present

## 2022-11-23 DIAGNOSIS — C250 Malignant neoplasm of head of pancreas: Secondary | ICD-10-CM | POA: Diagnosis not present

## 2022-11-23 DIAGNOSIS — K859 Acute pancreatitis without necrosis or infection, unspecified: Secondary | ICD-10-CM | POA: Diagnosis not present

## 2022-11-23 DIAGNOSIS — Z7901 Long term (current) use of anticoagulants: Secondary | ICD-10-CM | POA: Diagnosis not present

## 2022-11-23 DIAGNOSIS — Z5111 Encounter for antineoplastic chemotherapy: Secondary | ICD-10-CM | POA: Diagnosis not present

## 2022-11-23 DIAGNOSIS — I82462 Acute embolism and thrombosis of left calf muscular vein: Secondary | ICD-10-CM | POA: Diagnosis not present

## 2022-11-23 DIAGNOSIS — Z5189 Encounter for other specified aftercare: Secondary | ICD-10-CM | POA: Diagnosis not present

## 2022-11-23 MED ORDER — PEGFILGRASTIM-CBQV 6 MG/0.6ML ~~LOC~~ SOSY
6.0000 mg | PREFILLED_SYRINGE | Freq: Once | SUBCUTANEOUS | Status: AC
  Administered 2022-11-23: 6 mg via SUBCUTANEOUS
  Filled 2022-11-23: qty 0.6

## 2022-11-23 NOTE — Patient Instructions (Signed)

## 2022-11-24 ENCOUNTER — Telehealth: Payer: Self-pay

## 2022-11-24 LAB — CANCER ANTIGEN 19-9: CA 19-9: 3505 U/mL — ABNORMAL HIGH (ref 0–35)

## 2022-11-24 NOTE — Telephone Encounter (Signed)
Spouse left voicemail. Saw CA 19-9 on Mychart and is very worried about increase. Will this cause any changes in plan for tx or scans?

## 2022-11-24 NOTE — Telephone Encounter (Signed)
Spouse had requested leave voicemail if no answer as is running erransa. Per Ned Card, the rate of increase has slowed so this was not a big increase and may be showing a response to tx. Left voice mail as requested

## 2022-11-25 ENCOUNTER — Other Ambulatory Visit: Payer: Self-pay | Admitting: Nurse Practitioner

## 2022-11-25 ENCOUNTER — Encounter: Payer: Self-pay | Admitting: Nurse Practitioner

## 2022-11-25 ENCOUNTER — Ambulatory Visit (HOSPITAL_COMMUNITY)
Admission: RE | Admit: 2022-11-25 | Discharge: 2022-11-25 | Disposition: A | Discharge status: Home / Self Care | Payer: BC Managed Care – PPO | Source: Ambulatory Visit | Attending: Oncology | Admitting: Oncology

## 2022-11-25 ENCOUNTER — Encounter: Payer: Self-pay | Admitting: Oncology

## 2022-11-25 ENCOUNTER — Other Ambulatory Visit (HOSPITAL_COMMUNITY): Payer: Self-pay

## 2022-11-25 DIAGNOSIS — C259 Malignant neoplasm of pancreas, unspecified: Secondary | ICD-10-CM | POA: Diagnosis not present

## 2022-11-25 DIAGNOSIS — C250 Malignant neoplasm of head of pancreas: Secondary | ICD-10-CM

## 2022-11-25 DIAGNOSIS — R188 Other ascites: Secondary | ICD-10-CM | POA: Diagnosis not present

## 2022-11-25 MED ORDER — OXYCODONE HCL 5 MG PO TABS
10.0000 mg | ORAL_TABLET | ORAL | 0 refills | Status: DC | PRN
  Filled 2022-11-25: qty 150, 9d supply, fill #0

## 2022-11-25 MED ORDER — LIDOCAINE HCL 1 % IJ SOLN
INTRAMUSCULAR | Status: AC
  Administered 2022-11-25: 10 mL
  Filled 2022-11-25: qty 20

## 2022-11-25 NOTE — Procedures (Signed)
PROCEDURE SUMMARY:  Successful US guided paracentesis from right lateral abdomen.  Yielded 3.6 liters of yellow fluid.  No immediate complications.  Pt tolerated well.   Specimen was not sent for labs.  EBL < 67m  KDocia BarrierPA-C 11/25/2022 3:50 PM

## 2022-11-29 ENCOUNTER — Other Ambulatory Visit: Payer: Self-pay

## 2022-11-30 LAB — CYTOLOGY - NON PAP

## 2022-12-03 ENCOUNTER — Encounter: Payer: Self-pay | Admitting: Nurse Practitioner

## 2022-12-05 ENCOUNTER — Other Ambulatory Visit: Payer: Self-pay | Admitting: Oncology

## 2022-12-06 ENCOUNTER — Other Ambulatory Visit: Payer: Self-pay | Admitting: *Deleted

## 2022-12-06 ENCOUNTER — Encounter: Payer: Self-pay | Admitting: Oncology

## 2022-12-06 DIAGNOSIS — C250 Malignant neoplasm of head of pancreas: Secondary | ICD-10-CM

## 2022-12-06 NOTE — Progress Notes (Signed)
MyChart message requesting paracentesis this week. Scheduled for 1/4 at 2:00/2:30 at [facility].

## 2022-12-07 ENCOUNTER — Encounter: Payer: Self-pay | Admitting: *Deleted

## 2022-12-07 ENCOUNTER — Inpatient Hospital Stay: Payer: No Typology Code available for payment source | Admitting: Nutrition

## 2022-12-07 ENCOUNTER — Inpatient Hospital Stay: Payer: No Typology Code available for payment source

## 2022-12-07 ENCOUNTER — Inpatient Hospital Stay (HOSPITAL_BASED_OUTPATIENT_CLINIC_OR_DEPARTMENT_OTHER): Payer: No Typology Code available for payment source | Admitting: Oncology

## 2022-12-07 ENCOUNTER — Encounter: Payer: Self-pay | Admitting: Oncology

## 2022-12-07 ENCOUNTER — Inpatient Hospital Stay: Payer: No Typology Code available for payment source | Attending: Oncology

## 2022-12-07 VITALS — BP 102/75 | HR 66 | Resp 18

## 2022-12-07 DIAGNOSIS — Z79899 Other long term (current) drug therapy: Secondary | ICD-10-CM | POA: Diagnosis not present

## 2022-12-07 DIAGNOSIS — Z5111 Encounter for antineoplastic chemotherapy: Secondary | ICD-10-CM | POA: Diagnosis present

## 2022-12-07 DIAGNOSIS — Z7901 Long term (current) use of anticoagulants: Secondary | ICD-10-CM | POA: Diagnosis not present

## 2022-12-07 DIAGNOSIS — C250 Malignant neoplasm of head of pancreas: Secondary | ICD-10-CM

## 2022-12-07 DIAGNOSIS — I4891 Unspecified atrial fibrillation: Secondary | ICD-10-CM | POA: Diagnosis not present

## 2022-12-07 DIAGNOSIS — R948 Abnormal results of function studies of other organs and systems: Secondary | ICD-10-CM | POA: Diagnosis not present

## 2022-12-07 DIAGNOSIS — D709 Neutropenia, unspecified: Secondary | ICD-10-CM | POA: Diagnosis not present

## 2022-12-07 DIAGNOSIS — C787 Secondary malignant neoplasm of liver and intrahepatic bile duct: Secondary | ICD-10-CM | POA: Insufficient documentation

## 2022-12-07 DIAGNOSIS — Z86718 Personal history of other venous thrombosis and embolism: Secondary | ICD-10-CM | POA: Diagnosis not present

## 2022-12-07 LAB — CBC WITH DIFFERENTIAL (CANCER CENTER ONLY)
Abs Immature Granulocytes: 0.74 10*3/uL — ABNORMAL HIGH (ref 0.00–0.07)
Basophils Absolute: 0.1 10*3/uL (ref 0.0–0.1)
Basophils Relative: 1 %
Eosinophils Absolute: 0 10*3/uL (ref 0.0–0.5)
Eosinophils Relative: 0 %
HCT: 26.6 % — ABNORMAL LOW (ref 39.0–52.0)
Hemoglobin: 8.8 g/dL — ABNORMAL LOW (ref 13.0–17.0)
Immature Granulocytes: 3 %
Lymphocytes Relative: 3 %
Lymphs Abs: 0.7 10*3/uL (ref 0.7–4.0)
MCH: 35.9 pg — ABNORMAL HIGH (ref 26.0–34.0)
MCHC: 33.1 g/dL (ref 30.0–36.0)
MCV: 108.6 fL — ABNORMAL HIGH (ref 80.0–100.0)
Monocytes Absolute: 1 10*3/uL (ref 0.1–1.0)
Monocytes Relative: 5 %
Neutro Abs: 19 10*3/uL — ABNORMAL HIGH (ref 1.7–7.7)
Neutrophils Relative %: 88 %
Platelet Count: 185 10*3/uL (ref 150–400)
RBC: 2.45 MIL/uL — ABNORMAL LOW (ref 4.22–5.81)
RDW: 16.6 % — ABNORMAL HIGH (ref 11.5–15.5)
WBC Count: 21.6 10*3/uL — ABNORMAL HIGH (ref 4.0–10.5)
nRBC: 0 % (ref 0.0–0.2)

## 2022-12-07 LAB — CMP (CANCER CENTER ONLY)
ALT: 13 U/L (ref 10–47)
AST: 20 U/L (ref 11–38)
Albumin: 2.8 g/dL — ABNORMAL LOW (ref 3.5–5.0)
Alkaline Phosphatase: 300 U/L — ABNORMAL HIGH (ref 38–126)
Anion gap: 7 (ref 5–15)
BUN: 18 mg/dL (ref 6–20)
CO2: 29 mmol/L (ref 22–32)
Calcium: 8.7 mg/dL — ABNORMAL LOW (ref 8.9–10.3)
Chloride: 90 mmol/L — ABNORMAL LOW (ref 98–111)
Creatinine: 0.63 mg/dL (ref 0.60–1.20)
GFR, Estimated: >60 mL/min (ref >60)
Glucose, Bld: 96 mg/dL (ref 70–99)
Potassium: 4.5 mmol/L (ref 3.5–5.1)
Sodium: 126 mmol/L — ABNORMAL LOW (ref 135–145)
Total Bilirubin: 0.6 mg/dL (ref 0.2–1.6)
Total Protein: 5.9 g/dL — ABNORMAL LOW (ref 6.5–8.1)

## 2022-12-07 MED ORDER — SODIUM CHLORIDE 0.9% FLUSH
10.0000 mL | INTRAVENOUS | Status: DC | PRN
  Administered 2022-12-07: 10 mL

## 2022-12-07 MED ORDER — SODIUM CHLORIDE 0.9 % IV SOLN: Freq: Once | INTRAVENOUS | Status: AC

## 2022-12-07 MED ORDER — PACLITAXEL PROTEIN-BOUND CHEMO INJECTION 100 MG
75.0000 mg/m2 | Freq: Once | INTRAVENOUS | Status: AC
  Administered 2022-12-07: 150 mg via INTRAVENOUS
  Filled 2022-12-07: qty 30

## 2022-12-07 MED ORDER — PROCHLORPERAZINE MALEATE 10 MG PO TABS
10.0000 mg | ORAL_TABLET | Freq: Once | ORAL | Status: AC
  Administered 2022-12-07: 10 mg via ORAL
  Filled 2022-12-07: qty 1

## 2022-12-07 MED ORDER — HEPARIN SOD (PORK) LOCK FLUSH 100 UNIT/ML IV SOLN
500.0000 [IU] | Freq: Once | INTRAVENOUS | Status: AC | PRN
  Administered 2022-12-07: 500 [IU]

## 2022-12-07 MED ORDER — SODIUM CHLORIDE 0.9 % IV SOLN
600.0000 mg/m2 | Freq: Once | INTRAVENOUS | Status: AC
  Administered 2022-12-07: 1141 mg via INTRAVENOUS
  Filled 2022-12-07: qty 5.26

## 2022-12-07 NOTE — Progress Notes (Signed)
[place] OFFICE PROGRESS NOTE   Diagnosis: Pancreas cancer  INTERVAL HISTORY:   [name] complete another treatment with gemcitabine/Abraxane on 11/22/2022.  No fever or rash.  No change in cold sensitivity.  He reports a good appetite.  Abdominal pain is controlled with the current narcotic regimen.  He continues to have abdominal and leg swelling.  He last underwent a paracentesis 11/25/2022.  The cytology revealed malignant cells.  He is scheduled for a paracentesis tomorrow.  Objective:  Vital signs in last 24 hours:  Blood pressure 107/78, pulse 78, temperature 98.2 F (36.8 C), temperature source Oral, resp. rate 18, height _0  (1.803 m), weight 156 lb 3.2 oz (70.9 kg), SpO2 100 %.    HEENT: No thrush, 3-4 mm healing ulcer of the left buccal mucosa (he reports biting his cheek) Resp: Decreased breath sounds at the lower posterior chest bilaterally, end inspiratory rhonchi at the right lower posterior chest, no respiratory distress Cardio: Regular rate and rhythm GI: Distended with ascites, ventral hernia Vascular: Pitting edema throughout the legs bilaterally    Portacath/PICC-without erythema  Lab Results:  Lab Results  Component Value Date   WBC 21.6 (H) 12/07/2022   HGB 8.8 (L) 12/07/2022   HCT 26.6 (L) 12/07/2022   MCV 108.6 (H) 12/07/2022   PLT 185 12/07/2022   NEUTROABS 19.0 (H) 12/07/2022    CMP  Lab Results  Component Value Date   NA 126 (L) 12/07/2022   K 4.5 12/07/2022   CL 90 (L) 12/07/2022   CO2 29 12/07/2022   GLUCOSE 96 12/07/2022   BUN 18 12/07/2022   CREATININE 0.63 12/07/2022   CALCIUM 8.7 (L) 12/07/2022   PROT 5.9 (L) 12/07/2022   ALBUMIN 2.8 (L) 12/07/2022   AST 20 12/07/2022   ALT 13 12/07/2022   ALKPHOS 300 (H) 12/07/2022   BILITOT 0.6 12/07/2022   GFRNONAA >60 12/07/2022   GFRAA >60 08/19/2015    Lab Results  Component Value Date   CMK349 3,505 (H) 11/22/2022    No results found.  Medications:  I have reviewed the patient's current medications.   Assessment/Plan: Pancreas cancer, status post a pancreaticoduodenectomy 11/12/2021, stage IIb (pT1,pN1) 1/11 lymph nodes, perineural invasion positive, positive margin at the SMA and SMV MRCP 09/27/2021-diffuse intrahepatic and common bile duct dilatation with abrupt termination at the level of the head of the pancreas, indistinct area of hypoenhancement in the pancreas head ERCP with placement of pancreatic duct and bile duct stents 10/04/2021 EUS 1 10/04/2021-no pancreas mass identified, FNA of pancreas head-"atypical "cells, no adenopathy CT abdomen/pelvis 10/18/2021-no focal liver abnormality, intra and extrahepatic biliary ductal dilatation with narrowing of the common duct and the pancreas head, no enlarged abdominal lymph nodes Cycle 1 FOLFIRINOX 12/27/2021, irinotecan held with cycle 1 Cycle 2 FOLFIRINOX 01/24/2022, oxaliplatin and irinotecan dose reduced secondary to elevated liver enzymes, Udenyca Cycle 3 FOLFIRINOX 02/07/2022, same doses as cycle 2 Cycle 4 FOLFIRINOX 02/21/2022 Cycle 5 FOLFIRINOX 03/07/2022 Cycle 6 FOLFIRINOX 03/22/2022 Cycle 7 FOLFIRINOX 04/05/2022 Cycle 8 FOLFIRINOX 04/19/2022 Radiation/Xeloda 05/09/2022-06/16/2022 Paracentesis 09/06/2022-reactive mesothelial cells CTs 09/08/2022-multiple new right lobe liver lesions, ill-defined soft tissue pancreatic anastomosis, moderate volume ascites Ultrasound-guided biopsy of a right liver lesion 09/15/2022-moderate to poorly differentiated adenocarcinoma consistent with pancreas cancer; foundation 1-microsatellite stable, tumor mutation burden 2, K-ras G12D Cycle 1 gemcitabine/Abraxane 10/04/2022 Cycle 2 gemcitabine/Abraxane 10/25/2022, chemotherapy dose reduced due to neutropenia and thrombocytopenia, Udenyca Cycle 3 gemcitabine/Abraxane 11/08/2022, Udenyca Cycle 4 gemcitabine/Abraxane 11/22/2022, Udenyca, gemcitabine and Abraxane doses escalated Cycle 5  gemcitabine/Abraxane 12/07/2022,  Udenyca Acute pancreatitis 10/04/2021 Bipolar disorder Left soleal DVT 11/26/2021-apixaban, discontinued 07/14/2022 Port-A-Cath placement 12/22/2021  Atrial fibrillation 12/22/2021, converted to sinus rhythm, discharged home 12/23/2021 on metoprolol 12.5 mg twice daily, on Eliquis for prior DVT. Neutropenia secondary to chemotherapy 01/10/2022, Udenyca added with cycle 2 Elevated liver enzymes, predating chemotherapy Neutropenia/progressive thrombocytopenia following cycle 1 gemcitabine/Abraxane-Udenyca added, chemotherapy dose reduced       Disposition: Mr Reif appears stable.  He will continue the current narcotic pain regimen.  The sodium is low, likely related to a degree of dehydration with lack of oral intake and paracentesis procedure.  He could also have a component of SIADH.  He not appear symptomatic from the hyponatremia.  We will check a sodium level when he returns for a CT next week.  He will undergo a restaging CT 12/16/2022 and return for a scheduled visit on 12/20/2022.  The CA 19-9 has been higher over the past month.  We will follow-up on the CA 19-9 from today.  He will complete another cycle of gemcitabine/Abraxane today.  Betsy Coder, MD  12/07/2022  9:28 AM

## 2022-12-07 NOTE — Progress Notes (Signed)
Provided verbal and printed information for CT scan on 12/16/21 at [facility]. Arrive at 0945 for 10:00 scan. NPO 4 hours prior and will drink 2 cups of water on arrival to scan. Come to [facility] at 0900 for port access.

## 2022-12-07 NOTE — Progress Notes (Signed)
Nutrition follow up completed with patient during infusion for Pancreas Cancer.  Weight decreased and documented as 156# 3.2 oz, down from 163.4 # Jan 23.   Labs Na 126 and Alb 2.8.  His appetite is good and he eats frequently. Patient has ascites and leg swelling and is scheduled for another paracentesis on 1/4. His pain is controlled however, pain medications caused constipation. He is working on resolution to constipation. He drinks Premier Protein. Reports he avoids sodium and processed foods. He has started to drink some bone broth and enjoys it. Likely has a degree of malnutrition as he has notable fat and muscle losses and ascites.   Nutrition Diagnosis: Food and Nutrition Related Knowledge Deficit improved.  Intervention: Educated on importance of adequate protein and fluids. Continue at least one ONS daily. Bowel Regimen.  Monitoring, Evaluation, Goals: Consume adequate calories and protein to maintain lean body mass.  Next Visit: To be scheduled as needed.

## 2022-12-07 NOTE — Progress Notes (Signed)
Patient seen by Dr. Benay Spice today  Vitals are within treatment parameters.  Labs reviewed by Dr. Benay Spice and are not all within treatment parameters. Na+ low at 126--OK to treat  Per physician team, patient is ready for treatment and there are NO modifications to the treatment plan.

## 2022-12-07 NOTE — Patient Instructions (Signed)
[place]   Discharge Instructions: Thank you for choosing [facility] to provide your oncology and hematology care.   If you have a lab appointment with the [facility], please go directly to the [facility] and check in at the registration area.   Wear comfortable clothing and clothing appropriate for easy access to any Portacath or PICC line.   We strive to give you quality time with your provider. You may need to reschedule your appointment if you arrive late (15 or more minutes).  Arriving late affects you and other patients whose appointments are after yours.  Also, if you miss three or more appointments without notifying the office, you may be dismissed from the clinic at the provider's discretion.      For prescription refill requests, have your pharmacy contact our office and allow 72 hours for refills to be completed.    Today you received the following chemotherapy and/or immunotherapy agents Paclitaxel-protein bound (ABRAXANE) & Gemcitabine (GEMZAR).      To help prevent nausea and vomiting after your treatment, we encourage you to take your nausea medication as directed.  BELOW ARE SYMPTOMS THAT SHOULD BE REPORTED IMMEDIATELY: *FEVER GREATER THAN 100.4 F (38 C) OR HIGHER *CHILLS OR SWEATING *NAUSEA AND VOMITING THAT IS NOT CONTROLLED WITH YOUR NAUSEA MEDICATION *UNUSUAL SHORTNESS OF BREATH *UNUSUAL BRUISING OR BLEEDING *URINARY PROBLEMS (pain or burning when urinating, or frequent urination) *BOWEL PROBLEMS (unusual diarrhea, constipation, pain near the anus) TENDERNESS IN MOUTH AND THROAT WITH OR WITHOUT PRESENCE OF ULCERS (sore throat, sores in mouth, or a toothache) UNUSUAL RASH, SWELLING OR PAIN  UNUSUAL VAGINAL DISCHARGE OR ITCHING   Items with * indicate a potential emergency and should be followed up as soon as possible or go to the Emergency Department if any problems should occur.  Please show the CHEMOTHERAPY ALERT  CARD or IMMUNOTHERAPY ALERT CARD at check-in to the Emergency Department and triage nurse.  Should you have questions after your visit or need to cancel or reschedule your appointment, please contact [facility]  Dept: [phone]  and follow the prompts.  Office hours are 8:00 a.m. to 4:30 p.m. Monday - Friday. Please note that voicemails left after 4:00 p.m. may not be returned until the following business day.  We are closed weekends and major holidays. You have access to a nurse at all times for urgent questions. Please call the main number to the clinic Dept: [phone] and follow the prompts.   For any non-urgent questions, you may also contact your provider using MyChart. We now offer e-Visits for anyone 47 and older to request care online for non-urgent symptoms. For details visit [email or web address].   Also download the MyChart app! Go to the app store, search "MyChart", open the app, select [facility], and log in with your MyChart username and password.  Paclitaxel Nanoparticle Albumin-Bound Injection What is this medication? NANOPARTICLE ALBUMIN-BOUND PACLITAXEL (Na no PAHR ti kuhl al BYOO muhn-bound PAK li TAX el) treats some types of cancer. It works by slowing down the growth of cancer cells. This medicine may be used for other purposes; ask your health care provider or pharmacist if you have questions. COMMON BRAND NAME(S): Abraxane What should I tell my care team before I take this medication? They need to know if you have any of these conditions: Liver disease Low white blood cell levels An unusual or allergic reaction to paclitaxel, albumin, other medications, foods, dyes, or  preservatives If you or your partner are pregnant or trying to get pregnant Breast-feeding How should I use this medication? This medication is injected into a vein. It is given by your care team in a hospital or clinic setting. Talk to your care team about the use of  this medication in children. Special care may be needed. Overdosage: If you think you have taken too much of this medicine contact a poison control center or emergency room at once. NOTE: This medicine is only for you. Do not share this medicine with others. What if I miss a dose? Keep appointments for follow-up doses. It is important not to miss your dose. Call your care team if you are unable to keep an appointment. What may interact with this medication? Other medications may affect the way this medication works. Talk with your care team about all of the medications you take. They may suggest changes to your treatment plan to lower the risk of side effects and to make sure your medications work as intended. This list may not describe all possible interactions. Give your health care provider a list of all the medicines, herbs, non-prescription drugs, or dietary supplements you use. Also tell them if you smoke, drink alcohol, or use illegal drugs. Some items may interact with your medicine. What should I watch for while using this medication? Your condition will be monitored carefully while you are receiving this medication. You may need blood work while taking this medication. This medication may make you feel generally unwell. This is not uncommon as chemotherapy can affect healthy cells as well as cancer cells. Report any side effects. Continue your course of treatment even though you feel ill unless your care team tells you to stop. This medication can cause serious allergic reactions. To reduce the risk, your care team may give you other medications to take before receiving this one. Be sure to follow the directions from your care team. This medication may increase your risk of getting an infection. Call your care team for advice if you get a fever, chills, sore throat, or other symptoms of a cold or flu. Do not treat yourself. Try to avoid being around people who are sick. This medication may  increase your risk to bruise or bleed. Call your care team if you notice any unusual bleeding. Be careful brushing or flossing your teeth or using a toothpick because you may get an infection or bleed more easily. If you have any dental work done, tell your dentist you are receiving this medication. Talk to your care team if you or your partner may be pregnant. Serious birth defects can occur if you take this medication during pregnancy and for 6 months after the last dose. You will need a negative pregnancy test before starting this medication. Contraception is recommended while taking this medication and for 6 months after the last dose. Your care team can help you find the option that works for you. If your partner can get pregnant, use a condom during sex while taking this medication and for 3 months after the last dose. Do not breastfeed while taking this medication and for 2 weeks after the last dose. This medication may cause infertility. Talk to your care team if you are concerned about your fertility. What side effects may I notice from receiving this medication? Side effects that you should report to your care team as soon as possible: Allergic reactions--skin rash, itching, hives, swelling of the face, lips, tongue, or throat Dry  cough, shortness of breath or trouble breathing Infection--fever, chills, cough, sore throat, wounds that don't heal, pain or trouble when passing urine, general feeling of discomfort or being unwell Low red blood cell level--unusual weakness or fatigue, dizziness, headache, trouble breathing Pain, tingling, or numbness in the hands or feet Stomach pain, unusual weakness or fatigue, nausea, vomiting, diarrhea, or fever that lasts longer than expected Unusual bruising or bleeding Side effects that usually do not require medical attention (report to your care team if they continue or are bothersome): Diarrhea Fatigue Hair loss Loss of  appetite Nausea Vomiting This list may not describe all possible side effects. Call your doctor for medical advice about side effects. You may report side effects to FDA at 1-800-FDA-1088. Where should I keep my medication? This medication is given in a hospital or clinic. It will not be stored at home. NOTE: This sheet is a summary. It may not cover all possible information. If you have questions about this medicine, talk to your doctor, pharmacist, or health care provider.  2023 Elsevier/Gold Standard (2008-01-12 00:00:00) Gemcitabine Injection What is this medication? GEMCITABINE (jem SYE ta been) treats some types of cancer. It works by slowing down the growth of cancer cells. This medicine may be used for other purposes; ask your health care provider or pharmacist if you have questions. COMMON BRAND NAME(S): Gemzar, Infugem What should I tell my care team before I take this medication? They need to know if you have any of these conditions: Blood disorders Infection Kidney disease Liver disease Lung or breathing disease, such as asthma or COPD Recent or ongoing radiation therapy An unusual or allergic reaction to gemcitabine, other medications, foods, dyes, or preservatives If you or your partner are pregnant or trying to get pregnant Breast-feeding How should I use this medication? This medication is injected into a vein. It is given by your care team in a hospital or clinic setting. Talk to your care team about the use of this medication in children. Special care may be needed. Overdosage: If you think you have taken too much of this medicine contact a poison control center or emergency room at once. NOTE: This medicine is only for you. Do not share this medicine with others. What if I miss a dose? Keep appointments for follow-up doses. It is important not to miss your dose. Call your care team if you are unable to keep an appointment. What may interact with this  medication? Interactions have not been studied. This list may not describe all possible interactions. Give your health care provider a list of all the medicines, herbs, non-prescription drugs, or dietary supplements you use. Also tell them if you smoke, drink alcohol, or use illegal drugs. Some items may interact with your medicine. What should I watch for while using this medication? Your condition will be monitored carefully while you are receiving this medication. This medication may make you feel generally unwell. This is not uncommon, as chemotherapy can affect healthy cells as well as cancer cells. Report any side effects. Continue your course of treatment even though you feel ill unless your care team tells you to stop. In some cases, you may be given additional medications to help with side effects. Follow all directions for their use. This medication may increase your risk of getting an infection. Call your care team for advice if you get a fever, chills, sore throat, or other symptoms of a cold or flu. Do not treat yourself. Try to avoid being around  people who are sick. This medication may increase your risk to bruise or bleed. Call your care team if you notice any unusual bleeding. Be careful brushing or flossing your teeth or using a toothpick because you may get an infection or bleed more easily. If you have any dental work done, tell your dentist you are receiving this medication. Avoid taking medications that contain aspirin, acetaminophen, ibuprofen, naproxen, or ketoprofen unless instructed by your care team. These medications may hide a fever. Talk to your care team if you or your partner wish to become pregnant or think you might be pregnant. This medication can cause serious birth defects if taken during pregnancy and for 6 months after the last dose. A negative pregnancy test is required before starting this medication. A reliable form of contraception is recommended while taking  this medication and for 6 months after the last dose. Talk to your care team about effective forms of contraception. Do not father a child while taking this medication and for 3 months after the last dose. Use a condom while having sex during this time period. Do not breastfeed while taking this medication and for at least 1 week after the last dose. This medication may cause infertility. Talk to your care team if you are concerned about your fertility. What side effects may I notice from receiving this medication? Side effects that you should report to your care team as soon as possible: Allergic reactions--skin rash, itching, hives, swelling of the face, lips, tongue, or throat Capillary leak syndrome--stomach or muscle pain, unusual weakness or fatigue, feeling faint or lightheaded, decrease in the amount of urine, swelling of the ankles, hands, or feet, trouble breathing Infection--fever, chills, cough, sore throat, wounds that don't heal, pain or trouble when passing urine, general feeling of discomfort or being unwell Liver injury--right upper belly pain, loss of appetite, nausea, light-colored stool, dark yellow or brown urine, yellowing skin or eyes, unusual weakness or fatigue Low red blood cell level--unusual weakness or fatigue, dizziness, headache, trouble breathing Lung injury--shortness of breath or trouble breathing, cough, spitting up blood, chest pain, fever Stomach pain, bloody diarrhea, pale skin, unusual weakness or fatigue, decrease in the amount of urine, which may be signs of hemolytic uremic syndrome Sudden and severe headache, confusion, change in vision, seizures, which may be signs of posterior reversible encephalopathy syndrome (PRES) Unusual bruising or bleeding Side effects that usually do not require medical attention (report to your care team if they continue or are bothersome): Diarrhea Drowsiness Hair loss Nausea Pain, redness, or swelling with sores inside the  mouth or throat Vomiting This list may not describe all possible side effects. Call your doctor for medical advice about side effects. You may report side effects to FDA at 1-800-FDA-1088. Where should I keep my medication? This medication is given in a hospital or clinic. It will not be stored at home. NOTE: This sheet is a summary. It may not cover all possible information. If you have questions about this medicine, talk to your doctor, pharmacist, or health care provider.  2023 Elsevier/Gold Standard (2008-01-12 00:00:00)

## 2022-12-08 ENCOUNTER — Ambulatory Visit (HOSPITAL_COMMUNITY)
Admission: RE | Admit: 2022-12-08 | Discharge: 2022-12-08 | Disposition: A | Discharge status: Home / Self Care | Payer: No Typology Code available for payment source | Source: Ambulatory Visit | Attending: Oncology | Admitting: Oncology

## 2022-12-08 ENCOUNTER — Inpatient Hospital Stay: Payer: No Typology Code available for payment source

## 2022-12-08 ENCOUNTER — Telehealth: Payer: Self-pay | Admitting: *Deleted

## 2022-12-08 VITALS — BP 106/72 | HR 91 | Temp 97.7°F | Resp 20

## 2022-12-08 DIAGNOSIS — C250 Malignant neoplasm of head of pancreas: Secondary | ICD-10-CM | POA: Insufficient documentation

## 2022-12-08 LAB — CANCER ANTIGEN 19-9: CA 19-9: 2859 U/mL — ABNORMAL HIGH (ref 0–35)

## 2022-12-08 MED ORDER — LIDOCAINE HCL 1 % IJ SOLN
INTRAMUSCULAR | Status: AC
  Administered 2022-12-08: 15 mL
  Filled 2022-12-08: qty 20

## 2022-12-08 MED ORDER — PEGFILGRASTIM-CBQV 6 MG/0.6ML ~~LOC~~ SOSY
6.0000 mg | PREFILLED_SYRINGE | Freq: Once | SUBCUTANEOUS | Status: AC
  Administered 2022-12-08: 6 mg via SUBCUTANEOUS
  Filled 2022-12-08: qty 0.6

## 2022-12-08 NOTE — Patient Instructions (Signed)

## 2022-12-08 NOTE — Procedures (Signed)
PROCEDURE SUMMARY:  Successful US guided paracentesis from right abdomen.  Yielded 3.7 L of yellow fluid.  No immediate complications.  Pt tolerated well.   Specimen sent for labs.  EBL < 2 mL  Theresa Duty, NP 12/08/2022 4:15 PM

## 2022-12-08 NOTE — Telephone Encounter (Signed)
Call from Mrs. Dohse to inquire about CT scan on 1/12 and what are plans for 1/16 visit. Informed her that CT is a re-staging scan and that results will be reviewed on 1/16 and will proceed w/tx if better or cancel and regroup if there is progression. The improved CA 19.9 is hopeful, but not exact.

## 2022-12-09 ENCOUNTER — Encounter: Payer: Self-pay | Admitting: Oncology

## 2022-12-13 LAB — CYTOLOGY - NON PAP

## 2022-12-14 ENCOUNTER — Encounter: Payer: Self-pay | Admitting: Oncology

## 2022-12-15 ENCOUNTER — Encounter: Payer: Self-pay | Admitting: Oncology

## 2022-12-15 ENCOUNTER — Encounter: Payer: Self-pay | Admitting: Nurse Practitioner

## 2022-12-15 ENCOUNTER — Telehealth: Payer: Self-pay | Admitting: *Deleted

## 2022-12-15 NOTE — Telephone Encounter (Signed)
Mrs. Haque called to confirm that last paracentesis cytology was positive for malignant cells. This was confirmed w/her and also informed her that the last December paracentesis was also positive. We need to wait for the CT scan results of tomorrow before any future decisions are made. She would like to be called w/results--they no longer look at report in MyChart due to undue confusion and anxiety.

## 2022-12-16 ENCOUNTER — Ambulatory Visit (HOSPITAL_BASED_OUTPATIENT_CLINIC_OR_DEPARTMENT_OTHER)
Admission: RE | Admit: 2022-12-16 | Discharge: 2022-12-16 | Disposition: A | Discharge status: Home / Self Care | Payer: No Typology Code available for payment source | Source: Ambulatory Visit | Attending: Oncology | Admitting: Oncology

## 2022-12-16 ENCOUNTER — Inpatient Hospital Stay: Payer: No Typology Code available for payment source

## 2022-12-16 ENCOUNTER — Encounter: Payer: Self-pay | Admitting: *Deleted

## 2022-12-16 ENCOUNTER — Encounter: Payer: Self-pay | Admitting: Oncology

## 2022-12-16 ENCOUNTER — Other Ambulatory Visit: Payer: Self-pay | Admitting: *Deleted

## 2022-12-16 DIAGNOSIS — C250 Malignant neoplasm of head of pancreas: Secondary | ICD-10-CM

## 2022-12-16 LAB — BASIC METABOLIC PANEL - CANCER CENTER ONLY
Anion gap: 6 (ref 5–15)
BUN: 17 mg/dL (ref 6–20)
CO2: 28 mmol/L (ref 22–32)
Calcium: 8.5 mg/dL — ABNORMAL LOW (ref 8.9–10.3)
Chloride: 94 mmol/L — ABNORMAL LOW (ref 98–111)
Creatinine: 0.68 mg/dL (ref 0.61–1.24)
GFR, Estimated: >60 mL/min (ref >60)
Glucose, Bld: 100 mg/dL — ABNORMAL HIGH (ref 70–99)
Potassium: 4.4 mmol/L (ref 3.5–5.1)
Sodium: 128 mmol/L — ABNORMAL LOW (ref 135–145)

## 2022-12-16 MED ORDER — IOHEXOL 300 MG/ML  SOLN
100.0000 mL | Freq: Once | INTRAMUSCULAR | Status: AC | PRN
  Administered 2022-12-16: 80 mL via INTRAVENOUS

## 2022-12-16 NOTE — Progress Notes (Signed)
[name] requesting paracentesis on 1/15. Scheduled at [facility] for 1:30/2:00 pm on 1/15. Patient notified.

## 2022-12-16 NOTE — Progress Notes (Signed)
Faxed 1/03 office note, treatment record and 1/04 paracentesis report and cytology report to [facility] [phone]. CT scan of today not yet read.

## 2022-12-19 ENCOUNTER — Ambulatory Visit (HOSPITAL_COMMUNITY)
Admission: RE | Admit: 2022-12-19 | Discharge: 2022-12-19 | Disposition: A | Discharge status: Home / Self Care | Payer: No Typology Code available for payment source | Source: Ambulatory Visit | Attending: Oncology | Admitting: Oncology

## 2022-12-19 ENCOUNTER — Encounter: Payer: Self-pay | Admitting: Oncology

## 2022-12-19 ENCOUNTER — Encounter: Payer: Self-pay | Admitting: Nurse Practitioner

## 2022-12-19 DIAGNOSIS — C250 Malignant neoplasm of head of pancreas: Secondary | ICD-10-CM | POA: Diagnosis present

## 2022-12-19 DIAGNOSIS — R180 Malignant ascites: Secondary | ICD-10-CM | POA: Insufficient documentation

## 2022-12-19 MED ORDER — LIDOCAINE HCL 1 % IJ SOLN
INTRAMUSCULAR | Status: AC
  Administered 2022-12-19: 15 mL
  Filled 2022-12-19: qty 20

## 2022-12-19 NOTE — Procedures (Signed)
Ultrasound-guided diagnostic and therapeutic paracentesis performed yielding 5 liters (maximum ordered) of yellow fluid. No immediate complications. A portion of the fluid was sent to the lab for cytology. EBL none.

## 2022-12-20 ENCOUNTER — Inpatient Hospital Stay: Payer: No Typology Code available for payment source | Admitting: Licensed Clinical Social Worker

## 2022-12-20 ENCOUNTER — Inpatient Hospital Stay: Payer: No Typology Code available for payment source

## 2022-12-20 ENCOUNTER — Encounter: Payer: Self-pay | Admitting: Oncology

## 2022-12-20 ENCOUNTER — Encounter: Payer: Self-pay | Admitting: Nurse Practitioner

## 2022-12-20 ENCOUNTER — Inpatient Hospital Stay: Payer: No Typology Code available for payment source | Admitting: Nurse Practitioner

## 2022-12-20 VITALS — BP 102/70 | HR 90 | Temp 98.1°F | Resp 18 | Ht 71.0 in | Wt 141.6 lb

## 2022-12-20 DIAGNOSIS — C250 Malignant neoplasm of head of pancreas: Secondary | ICD-10-CM

## 2022-12-20 DIAGNOSIS — Z5111 Encounter for antineoplastic chemotherapy: Secondary | ICD-10-CM | POA: Diagnosis not present

## 2022-12-20 DIAGNOSIS — Z95828 Presence of other vascular implants and grafts: Secondary | ICD-10-CM

## 2022-12-20 LAB — CBC WITH DIFFERENTIAL (CANCER CENTER ONLY)
Abs Immature Granulocytes: 0.08 10*3/uL — ABNORMAL HIGH (ref 0.00–0.07)
Basophils Absolute: 0.1 10*3/uL (ref 0.0–0.1)
Basophils Relative: 0 %
Eosinophils Absolute: 0 10*3/uL (ref 0.0–0.5)
Eosinophils Relative: 0 %
HCT: 23.9 % — ABNORMAL LOW (ref 39.0–52.0)
Hemoglobin: 7.8 g/dL — ABNORMAL LOW (ref 13.0–17.0)
Immature Granulocytes: 1 %
Lymphocytes Relative: 4 %
Lymphs Abs: 0.7 10*3/uL (ref 0.7–4.0)
MCH: 36.4 pg — ABNORMAL HIGH (ref 26.0–34.0)
MCHC: 32.6 g/dL (ref 30.0–36.0)
MCV: 111.7 fL — ABNORMAL HIGH (ref 80.0–100.0)
Monocytes Absolute: 1 10*3/uL (ref 0.1–1.0)
Monocytes Relative: 6 %
Neutro Abs: 14.7 10*3/uL — ABNORMAL HIGH (ref 1.7–7.7)
Neutrophils Relative %: 89 %
Platelet Count: 153 10*3/uL (ref 150–400)
RBC: 2.14 MIL/uL — ABNORMAL LOW (ref 4.22–5.81)
RDW: 17.8 % — ABNORMAL HIGH (ref 11.5–15.5)
WBC Count: 16.5 10*3/uL — ABNORMAL HIGH (ref 4.0–10.5)
nRBC: 0 % (ref 0.0–0.2)

## 2022-12-20 LAB — CMP (CANCER CENTER ONLY)
ALT: 15 U/L (ref 0–44)
AST: 19 U/L (ref 15–41)
Albumin: 2.4 g/dL — ABNORMAL LOW (ref 3.5–5.0)
Alkaline Phosphatase: 255 U/L — ABNORMAL HIGH (ref 38–126)
Anion gap: 5 (ref 5–15)
BUN: 22 mg/dL — ABNORMAL HIGH (ref 6–20)
CO2: 28 mmol/L (ref 22–32)
Calcium: 8.2 mg/dL — ABNORMAL LOW (ref 8.9–10.3)
Chloride: 96 mmol/L — ABNORMAL LOW (ref 98–111)
Creatinine: 0.66 mg/dL (ref 0.61–1.24)
GFR, Estimated: >60 mL/min (ref >60)
Glucose, Bld: 100 mg/dL — ABNORMAL HIGH (ref 70–99)
Potassium: 4.2 mmol/L (ref 3.5–5.1)
Sodium: 129 mmol/L — ABNORMAL LOW (ref 135–145)
Total Bilirubin: 0.6 mg/dL (ref 0.3–1.2)
Total Protein: 5.3 g/dL — ABNORMAL LOW (ref 6.5–8.1)

## 2022-12-20 LAB — CYTOLOGY - NON PAP

## 2022-12-20 LAB — CANCER ANTIGEN 19-9: CA 19-9: 2819 U/mL — ABNORMAL HIGH (ref 0–35)

## 2022-12-20 MED ORDER — MORPHINE SULFATE ER 30 MG PO TBCR: 30.0000 mg | EXTENDED_RELEASE_TABLET | Freq: Two times a day (BID) | ORAL | 0 refills | Status: AC

## 2022-12-20 MED ORDER — OXYCODONE HCL 5 MG PO TABS: 10.0000 mg | ORAL_TABLET | ORAL | 0 refills | Status: AC | PRN

## 2022-12-20 MED ORDER — SODIUM CHLORIDE 0.9% FLUSH
10.0000 mL | INTRAVENOUS | Status: AC | PRN
  Administered 2022-12-20: 10 mL via INTRAVENOUS

## 2022-12-20 MED ORDER — HEPARIN SOD (PORK) LOCK FLUSH 100 UNIT/ML IV SOLN
500.0000 [IU] | Freq: Once | INTRAVENOUS | Status: AC
  Administered 2022-12-20: 500 [IU] via INTRAVENOUS

## 2022-12-20 NOTE — Progress Notes (Addendum)
[facility] OFFICE PROGRESS NOTE   Diagnosis: Pancreas cancer  INTERVAL HISTORY:   Dean [name] returns as scheduled.  He completed cycle 5 gemcitabine/Abraxane 12/07/2022.  He denies nausea/vomiting.  No mouth sores.  No diarrhea.  He had a paracentesis yesterday.  He reports feeling better for about a week after each procedure.  He continues to have significant leg swelling.  He continues to have abdominal pain.  He is taking MS Contin 15 mg every 12 hours with oxycodone every 4-5 hours around-the-clock.  Objective:  Vital signs in last 24 hours:  Blood pressure 102/70, pulse (!) 105, temperature 98.1 F (36.7 C), resp. rate 18, height '5\' 11"'$  (1.803 m), weight 141 lb 9.6 oz (64.2 kg), SpO2 98 %.    Resp: Lungs clear bilaterally. Cardio: Regular, mild tachycardia. GI: Abdomen is distended consistent with ascites. Vascular: Pitting edema throughout both legs. Neuro: Alert and oriented. Port-A-Cath without erythema.  Lab Results:  Lab Results  Component Value Date   WBC 16.5 (H) 12/20/2022   HGB 7.8 (L) 12/20/2022   HCT 23.9 (L) 12/20/2022   MCV 111.7 (H) 12/20/2022   PLT 153 12/20/2022   NEUTROABS 14.7 (H) 12/20/2022    Imaging:  US Paracentesis  Result Date: 12/19/2022 INDICATION: Patient with history of pancreatic cancer, recurrent malignant ascites. Request received for diagnostic and therapeutic paracentesis up to 5 liters. EXAM: ULTRASOUND GUIDED DIAGNOSTIC AND THERAPEUTIC PARACENTESIS MEDICATIONS: 8 mL 1% lidocaine COMPLICATIONS: None immediate. PROCEDURE: Informed written consent was obtained from the patient after a discussion of the risks, benefits and alternatives to treatment. A timeout was performed prior to the initiation of the procedure. Initial ultrasound scanning demonstrates a large amount of ascites within the left lower abdominal quadrant. The left lower abdomen was prepped and draped in the usual sterile fashion. 1% lidocaine was used for  local anesthesia. Following this, a 19 gauge, 7-cm, Yueh catheter was introduced. An ultrasound image was saved for documentation purposes. The paracentesis was performed. The catheter was removed and a dressing was applied. The patient tolerated the procedure well without immediate post procedural complication. FINDINGS: A total of approximately 5 liters of yellow fluid was removed. Samples were sent to the laboratory as requested by the clinical team. IMPRESSION: Successful ultrasound-guided diagnostic and therapeutic paracentesis yielding 5 liters of peritoneal fluid. Read by: Rowe Robert, PA-C Electronically Signed   By: Aletta Edouard M.D.   On: 12/19/2022 16:04    Medications: I have reviewed the patient's current medications.  Assessment/Plan: Pancreas cancer, status post a pancreaticoduodenectomy 11/12/2021, stage IIb (pT1,pN1) 1/11 lymph nodes, perineural invasion positive, positive margin at the SMA and SMV MRCP 09/27/2021-diffuse intrahepatic and common bile duct dilatation with abrupt termination at the level of the head of the pancreas, indistinct area of hypoenhancement in the pancreas head ERCP with placement of pancreatic duct and bile duct stents 10/04/2021 EUS 1 10/04/2021-no pancreas mass identified, FNA of pancreas head-"atypical "cells, no adenopathy CT abdomen/pelvis 10/18/2021-no focal liver abnormality, intra and extrahepatic biliary ductal dilatation with narrowing of the common duct and the pancreas head, no enlarged abdominal lymph nodes Cycle 1 FOLFIRINOX 12/27/2021, irinotecan held with cycle 1 Cycle 2 FOLFIRINOX 01/24/2022, oxaliplatin and irinotecan dose reduced secondary to elevated liver enzymes, Udenyca Cycle 3 FOLFIRINOX 02/07/2022, same doses as cycle 2 Cycle 4 FOLFIRINOX 02/21/2022 Cycle 5 FOLFIRINOX 03/07/2022 Cycle 6 FOLFIRINOX 03/22/2022 Cycle 7 FOLFIRINOX 04/05/2022 Cycle 8 FOLFIRINOX 04/19/2022 Radiation/Xeloda 05/09/2022-06/16/2022 Paracentesis 09/06/2022-reactive  mesothelial cells; 11/25/2022, 12/08/2022, 12/19/2022 cytology positive for malignant cells.  CTs 09/08/2022-multiple new right lobe liver lesions, ill-defined soft tissue pancreatic anastomosis, moderate volume ascites Ultrasound-guided biopsy of a right liver lesion 09/15/2022-moderate to poorly differentiated adenocarcinoma consistent with pancreas cancer; foundation 1-microsatellite stable, tumor mutation burden 2, K-ras G12D Cycle 1 gemcitabine/Abraxane 10/04/2022 Cycle 2 gemcitabine/Abraxane 10/25/2022, chemotherapy dose reduced due to neutropenia and thrombocytopenia, Udenyca Cycle 3 gemcitabine/Abraxane 11/08/2022, Udenyca Cycle 4 gemcitabine/Abraxane 11/22/2022, Udenyca, gemcitabine and Abraxane doses escalated Cycle 5 gemcitabine/Abraxane 12/07/2022, Udenyca CTs 12/16/2022-increasing size and number of liver metastases.  Increasing ascites with slight mass effect.  Subtle areas of thickening along the course of the peritoneum. Acute pancreatitis 10/04/2021 Bipolar disorder Left soleal DVT 11/26/2021-apixaban, discontinued 07/14/2022 Port-A-Cath placement 12/22/2021  Atrial fibrillation 12/22/2021, converted to sinus rhythm, discharged home 12/23/2021 on metoprolol 12.5 mg twice daily, on Eliquis for prior DVT. Neutropenia secondary to chemotherapy 01/10/2022, Udenyca added with cycle 2 Elevated liver enzymes, predating chemotherapy Neutropenia/progressive thrombocytopenia following cycle 1 gemcitabine/Abraxane-Udenyca added, chemotherapy dose reduced        Disposition: Dean [name] has completed 5 cycles of gemcitabine/Abraxane.  Recent restaging CTs show evidence of progression with increase in the size and number of liver metastases; carcinomatosis.  He and his wife understand the recommendation is to discontinue current treatment with gemcitabine/Abraxane.  Dr. Benay Spice reviewed options to include a trial of liposomal irinotecan/5-fluorouracil every 2 weeks versus supportive/comfort care.  He  would like to proceed with additional systemic therapy.  We reviewed potential toxicities associated with the treatment.  He agrees to proceed.  He will return in 1 week for cycle 1 liposomal irinotecan/5-fluorouracil.  His pain is poorly controlled.  We increased the MS Contin to 30 mg every 12 hours.  He will continue oxycodone as needed.  We will see him in follow-up in 3 weeks, prior to cycle 2 liposomal irinotecan/5-fluorouracil.  Patient seen with Dr. Benay Spice.  CT report/images reviewed with Dean [name] and his wife on the computer.  Ned Card ANP/GNP-BC   12/20/2022  10:21 AM  This was a shared visit with Ned Card.  Dean [name] has metastatic pancreas cancer.  He has abdominal carcinomatosis.  We reviewed the restaging CT findings and images with him.  He has a poor prognosis.  We discussed hospice care.  He does not wish to enter hospice care at present.  He would like to proceed with a trial of salvage systemic therapy.  I recommend 5-FU/liposomal irinotecan.  We reviewed potential toxicities associated with this regimen including the chance of diarrhea.  He agrees to proceed.  A chemotherapy plan was entered today.  I was present for greater than 50% of today's visit.  I performed medical decision making.  Julieanne Manson, MD

## 2022-12-20 NOTE — Progress Notes (Signed)
DISCONTINUE ON PATHWAY REGIMEN - Pancreatic Adenocarcinoma     A cycle is every 28 days:     Nab-paclitaxel (protein bound)      Gemcitabine   **Always confirm dose/schedule in your pharmacy ordering system**  REASON: Disease Progression PRIOR TREATMENT: PANOS51: Nab-Paclitaxel (Abraxane) 125 mg/m2 D1, 8, 15 + Gemcitabine 1,000 mg/m2 D1, 8, 15 q28 Days Until Progression or Toxicity TREATMENT RESPONSE: Progressive Disease (PD)  START ON PATHWAY REGIMEN - Pancreatic Adenocarcinoma     A cycle is every 14 days:     Liposomal irinotecan      Leucovorin      Fluorouracil   **Always confirm dose/schedule in your pharmacy ordering system**  Patient Characteristics: Metastatic Disease, Second Line, MSS/pMMR or MSI Unknown, Gemcitabine-Based Therapy First Line Therapeutic Status: Metastatic Disease Line of Therapy: Second Line Microsatellite/Mismatch Repair Status: MSS/pMMR Intent of Therapy: Non-Curative / Palliative Intent, Discussed with Patient 

## 2022-12-20 NOTE — Addendum Note (Signed)
Addended by: Velna Hatchet on: 12/20/2022 10:57 AM   Modules accepted: Orders

## 2022-12-20 NOTE — Progress Notes (Signed)
Dean [name]  Holiday representative met with patient and his wife, Dean [name], to provide supportive counseling.  Dean [name] said they just met with the MD and were informed that the cancer has progressed.  They were both glad that they received options going forward for treatment.  Discussed emotions and coping skills, as well as, what is important to both of them and their family members.  Normalized Dean [name]'s feelings through this process.  Encouraged them to contact CSW with any support needs in the future.    [name] Dean Greeson, LCSW

## 2022-12-21 ENCOUNTER — Encounter: Payer: Self-pay | Admitting: Oncology

## 2022-12-21 ENCOUNTER — Encounter: Payer: Self-pay | Admitting: Psychiatry

## 2022-12-21 ENCOUNTER — Ambulatory Visit (INDEPENDENT_AMBULATORY_CARE_PROVIDER_SITE_OTHER): Payer: No Typology Code available for payment source | Admitting: Psychiatry

## 2022-12-21 ENCOUNTER — Inpatient Hospital Stay: Payer: No Typology Code available for payment source

## 2022-12-21 ENCOUNTER — Other Ambulatory Visit: Payer: Self-pay

## 2022-12-21 DIAGNOSIS — F5105 Insomnia due to other mental disorder: Secondary | ICD-10-CM | POA: Diagnosis not present

## 2022-12-21 DIAGNOSIS — F3181 Bipolar II disorder: Secondary | ICD-10-CM | POA: Diagnosis not present

## 2022-12-21 LAB — CANCER ANTIGEN 19-9: CA 19-9: 2827 U/mL — ABNORMAL HIGH (ref 0–35)

## 2022-12-21 MED ORDER — DIVALPROEX SODIUM 500 MG PO DR TAB: DELAYED_RELEASE_TABLET | ORAL | 0 refills | Status: AC

## 2022-12-21 NOTE — Progress Notes (Signed)
MANFRED LASPINA [id number] Feb 12, 1966 57 y.o.   Subjective:   Patient ID:  Dean [name] is a 57 y.o. (DOB [birth year]) male.  Chief Complaint:  Chief Complaint  Patient presents with   Follow-up   Depression   Anxiety    HPI Dean [name] presents today for follow-up of bipolar disorder and insomnia..  05/24/21 appt noted:  No meds were changed. Depakote ER 2500 mg daily.  Rare Xanax. Pretty good.  No unusual mood swings.  Anger in check. Restless sleep and no Xanax bc hangover. Initial and terminal insomnia. Avrage in bed 230 to 1230.  2nd shift. No caffeine. Patient reports stable mood and denies depressed or irritable moods.  Patient denies any recent difficulty with anxiety. Denies appetite disturbance.  Patient reports that energy and motivation have been good.  Patient denies any difficulty with concentration.  Patient denies any suicidal ideation.  12/21/2022 appointment noted: Continues Depakote 500 mg twice daily and 1000 mg nightly and alprazolam 0.5 mg nightly as needed insomnia.  He is also on MS Contin and oxycodone as needed Been battling pancreatic CA dx 10/22 stage 2.  Lost a lot of weight and had surgery.  Mets to liver last year. Treatment has failed with continued spread.  One more treatment option.  Wants to pursue that to get as much time with family as possible.  Tolerated the treatments so far.   6 mos at best. Been holding up pretty well and a lot better than wife who is upset and angry.  She is getting counseling and plenty of resources. Sleep is kind of rough bc discomfort.  Lost so much weight has pressure points.  Pain meds do help.   Past Psychiatric Medication Trials: Depakote 2500 mg,  buspirone 15 twice daily, Lexapro 10, citalopram 20, Xanax XR, alprazolam hangover, Ambien ? effect,  hydroxyzine, trazodone  Review of Systems:  Review of Systems  Constitutional:  Positive for activity change, appetite change and unexpected  weight change.  Musculoskeletal:  Positive for arthralgias.  Neurological:  Positive for weakness. Negative for tremors.  Psychiatric/Behavioral:  Positive for sleep disturbance. Negative for agitation, behavioral problems, confusion, decreased concentration, dysphoric mood, hallucinations, self-injury and suicidal ideas. The patient is not nervous/anxious and is not hyperactive.     Medications: I have reviewed the patient's current medications.  Current Outpatient Medications  Medication Sig Dispense Refill   ALPRAZolam (XANAX) 0.5 MG tablet TAKE 1 TABLET BY MOUTH AT BEDTIME AS NEEDED FOR ANXIETY 30 tablet 1   CREON 36000-114000 units CPEP capsule Take 2-3 capsules by mouth See admin instructions. Take 3 capsules with each meal and 2 capsules with each snack     levothyroxine (SYNTHROID) 200 MCG tablet Take 200 mcg by mouth daily.     lidocaine-prilocaine (EMLA) cream Apply 1 application topically as needed. Apply 1/2 tablespoon to port site and cover with Press-and-Seal 2 hours prior to access to numb site. Do not start until 14 days after port is inserted. 30 g 2   morphine (MS CONTIN) 30 MG 12 hr tablet Take 1 tablet (30 mg total) by mouth every 12 (twelve) hours. 60 tablet 0   ondansetron (ZOFRAN) 8 MG tablet TAKE 1 TABLET BY MOUTH EVERY 8 HOURS AS NEEDED FOR NAUSEA OR VOMITING. START TAKING 72 HOURS AFTER IV CHEMOTHERAPY TREATMENT GIVEN 40 tablet 1   oxyCODONE (OXY IR/ROXICODONE) 5 MG immediate release tablet Take 2-3 tablets (10-15 mg total) by mouth every 4 (four) hours as needed for severe  pain. 150 tablet 0   divalproex (DEPAKOTE) 500 MG DR tablet TAKE 1 TABLET TWICE DAILY AND 2 TABLETS AT NIGHT 360 tablet 0   No current facility-administered medications for this visit.   Facility-Administered Medications Ordered in Other Visits  Medication Dose Route Frequency Provider Last Rate Last Admin   sodium chloride flush (NS) 0.9 % injection 10 mL  10 mL Intravenous PRN Owens Shark, NP    10 mL at 12/20/22 1053    Medication Side Effects: None  Allergies:  Allergies  Allergen Reactions   Erythromycin Nausea Only   Nsaids Other (See Comments)    Peptic ulcer     Past Medical History:  Diagnosis Date   Bipolar 1 disorder ([place])    DVT (deep venous thrombosis) ([place]) 11/2021   left leg   Family history of breast cancer 12/16/2021   GERD (gastroesophageal reflux disease)    Hypothyroidism    Pancreas cancer ([place]) 11/12/2021   Thyroid disease     Family History  Problem Relation Age of Onset   Thyroid cancer Father        dx unknown age   66 Maternal Uncle        x2 mat uncles; unknown type;  ?pancreatic/prostate; dx after 14   Breast cancer Paternal Aunt        dx after 74   Cancer Paternal Aunt        x2 paternal aunts; unknown type   Lung cancer Maternal Grandmother        dx after 70; smoking hx   Lung cancer Maternal Grandfather        dx after 54; smoking hx    Social History   Socioeconomic History   Marital status: Married    Spouse name: Not on file   Number of children: Not on file   Years of education: Not on file   Highest education level: Not on file  Occupational History   Not on file  Tobacco Use   Smoking status: Former    Types: Cigarettes    Quit date: 2012    Years since quitting: 12.0   Smokeless tobacco: Never  Vaping Use   Vaping Use: Never used  Substance and Sexual Activity   Alcohol use: Not Currently   Drug use: No   Sexual activity: Not on file  Other Topics Concern   Not on file  Social History Narrative   Not on file   Social Determinants of Health   Financial Resource Strain: Low Risk  (03/29/2022)   Overall Financial Resource Strain (CARDIA)    Difficulty of Paying Living Expenses: Not very hard  Food Insecurity: No Food Insecurity (03/29/2022)   Hunger Vital Sign    Worried About Running Out of Food in the Last Year: Never true    Ran Out of Food in the Last Year: Never true  Transportation Needs:  No Transportation Needs (03/29/2022)   PRAPARE - Hydrologist (Medical): No    Lack of Transportation (Non-Medical): No  Physical Activity: Inactive (03/29/2022)   Exercise Vital Sign    Days of Exercise per Week: 0 days    Minutes of Exercise per Session: 0 min  Stress: Stress Concern Present (03/29/2022)   [place]    Feeling of Stress : To some extent  Social Connections: Moderately Integrated (03/29/2022)   Social Connection and Isolation Panel [NHANES]    Frequency of Communication  with Friends and Family: Three times a week    Frequency of Social Gatherings with Friends and Family: Twice a week    Attends Religious Services: 1 to 4 times per year    Active Member of [employer] or Organizations: No    Attends Archivist Meetings: Never    Marital Status: Married  Human resources officer Violence: Not At Risk (03/29/2022)   Humiliation, Afraid, Rape, and Kick questionnaire    Fear of Current or Ex-Partner: No    Emotionally Abused: No    Physically Abused: No    Sexually Abused: No    Past Medical History, Surgical history, Social history, and Family history were reviewed and updated as appropriate.   Please see review of systems for further details on the patient's review from today.   Objective:   Physical Exam:  There were no vitals taken for this visit.  Physical Exam Constitutional:      Appearance: He is ill-appearing.  Musculoskeletal:        General: No deformity.  Neurological:     Mental Status: He is alert and oriented to person, place, and time.     Motor: Weakness present.     Coordination: Coordination normal.  Psychiatric:        Attention and Perception: Attention and perception normal. He does not perceive auditory or visual hallucinations.        Mood and Affect: Mood normal. Mood is not anxious or depressed. Affect is not labile, blunt, angry or inappropriate.         Speech: Speech normal.        Behavior: Behavior normal.        Thought Content: Thought content normal. Thought content is not paranoid or delusional. Thought content does not include homicidal or suicidal ideation. Thought content does not include suicidal plan.        Cognition and Memory: Cognition and memory normal.        Judgment: Judgment normal.     Comments: Insight intact At relative peace with situation     Lab Review:     Component Value Date/Time   NA 129 (L) 12/20/2022 0907   K 4.2 12/20/2022 0907   CL 96 (L) 12/20/2022 0907   CO2 28 12/20/2022 0907   GLUCOSE 100 (H) 12/20/2022 0907   BUN 22 (H) 12/20/2022 0907   CREATININE 0.66 12/20/2022 0907   CALCIUM 8.2 (L) 12/20/2022 0907   PROT 5.3 (L) 12/20/2022 0907   ALBUMIN 2.4 (L) 12/20/2022 0907   AST 19 12/20/2022 0907   ALT 15 12/20/2022 0907   ALKPHOS 255 (H) 12/20/2022 0907   BILITOT 0.6 12/20/2022 0907   GFRNONAA >60 12/20/2022 0907   GFRAA >60 08/19/2015 1925       Component Value Date/Time   WBC 16.5 (H) 12/20/2022 0907   WBC 2.3 (L) 09/15/2022 1056   RBC 2.14 (L) 12/20/2022 0907   HGB 7.8 (L) 12/20/2022 0907   HCT 23.9 (L) 12/20/2022 0907   PLT 153 12/20/2022 0907   MCV 111.7 (H) 12/20/2022 0907   MCH 36.4 (H) 12/20/2022 0907   MCHC 32.6 12/20/2022 0907   RDW 17.8 (H) 12/20/2022 0907   LYMPHSABS 0.7 12/20/2022 0907   MONOABS 1.0 12/20/2022 0907   EOSABS 0.0 12/20/2022 0907   BASOSABS 0.1 12/20/2022 0907    No results found for: "POCLITH", "LITHIUM"   No results found for: "PHENYTOIN", "PHENOBARB", "VALPROATE", "CBMZ"   .res Assessment: Plan:    Bipolar II disorder (  [place]) - Plan: Valproic acid level, divalproex (DEPAKOTE) 500 MG DR tablet  Insomnia due to mental condition   Disc risk mood cycling even on meds and to call us if this occurs.  Benefit from meds.  Is actually taking 4 Depakote   .  He is handling stress terminal pancreatic CA well.  No mania since here.  He has a  history of chronic insomnia.  Because stable for extended period and for cost reasons he has to be seen once yearly.   Protect sleep bc risk for mood swings.  Extensive discussion sleep hygiene esp restriction bc in bed too much.  Disc dx shift work sleep disorder. Disc option sleep meds.  He's sensitive to xanax hangover.  Option other bz or non-bz.  We discussed the short-term risks associated with benzodiazepines including sedation and increased fall risk among others.  Discussed long-term side effect risk including dependence, potential withdrawal symptoms, and the potential eventual dose-related risk of dementia.  But recent studies from 2020 dispute this association between benzodiazepines and dementia risk. Newer studies in 2020 do not support an association with dementia. He wants to avoid more meds.  Call prn  Continue current meds.  He agrees  [facility] cancer with mets to liver check VPA level.  May need to reduce dose  FU 6 mos  Lynder Parents, MD, DFAPA Please see After Visit Summary for patient specific instructions.  Future Appointments  Date Time Provider [place]  12/28/2022  9:30 AM [facility] PHLEBOTOMIST [facility] None  12/28/2022  9:45 AM [facility] FLUSH ROOM [facility] None  12/28/2022 10:30 AM [facility] CHAIR 3 [facility] None  12/30/2022  1:15 PM [facility] FLUSH ROOM [facility] None  01/10/2023  7:45 AM [facility] PHLEBOTOMIST [facility] None  01/10/2023  8:00 AM Ladell Pier, MD [facility] None  01/10/2023  8:00 AM [facility] FLUSH ROOM [facility] None  01/10/2023  9:15 AM [facility] CHAIR 3 [facility] None  01/12/2023  1:30 PM [facility] FLUSH ROOM [facility] None  01/24/2023  9:00 AM [facility] PHLEBOTOMIST [facility] None  01/24/2023  9:15 AM [facility] FLUSH ROOM [facility] None  01/24/2023  9:40 AM Ladell Pier, MD [facility] None  01/24/2023 10:45 AM [facility] CHAIR 3 [facility] None  01/26/2023  1:15 PM [facility] FLUSH ROOM [facility] None     Orders Placed This Encounter   Procedures   Valproic acid level      -------------------------------

## 2022-12-22 ENCOUNTER — Telehealth: Payer: Self-pay | Admitting: *Deleted

## 2022-12-22 NOTE — Telephone Encounter (Signed)
Left VM for Dean [name] that due to his metastatic disease, radiofrequency ablation is not indicated.

## 2022-12-22 NOTE — Telephone Encounter (Signed)
Mrs. Aspinall called to inquire if [facility] testing would be appropriate for patient or radiofrequency ablation for his cancer. This RN informed her that he has already had [facility] testing and does not need the [facility] testing. Will forward ablation question to MD.

## 2022-12-25 ENCOUNTER — Other Ambulatory Visit: Payer: Self-pay | Admitting: Oncology

## 2022-12-26 ENCOUNTER — Encounter: Payer: Self-pay | Admitting: Nurse Practitioner

## 2022-12-26 ENCOUNTER — Other Ambulatory Visit: Payer: Self-pay | Admitting: *Deleted

## 2022-12-26 ENCOUNTER — Encounter: Payer: Self-pay | Admitting: *Deleted

## 2022-12-26 DIAGNOSIS — C250 Malignant neoplasm of head of pancreas: Secondary | ICD-10-CM

## 2022-12-26 NOTE — Progress Notes (Signed)
Dean [name] is requesting paracentesis 1/22 or 1/23.

## 2022-12-27 ENCOUNTER — Ambulatory Visit (HOSPITAL_COMMUNITY)
Admission: RE | Admit: 2022-12-27 | Discharge: 2022-12-27 | Disposition: A | Discharge status: Home / Self Care | Payer: No Typology Code available for payment source | Source: Ambulatory Visit | Attending: Oncology | Admitting: Oncology

## 2022-12-27 DIAGNOSIS — R180 Malignant ascites: Secondary | ICD-10-CM | POA: Insufficient documentation

## 2022-12-27 DIAGNOSIS — C250 Malignant neoplasm of head of pancreas: Secondary | ICD-10-CM | POA: Diagnosis present

## 2022-12-27 MED ORDER — LIDOCAINE HCL 1 % IJ SOLN
INTRAMUSCULAR | Status: AC
  Administered 2022-12-27: 15 mL
  Filled 2022-12-27: qty 20

## 2022-12-27 NOTE — Procedures (Signed)
Ultrasound-guided diagnostic and therapeutic paracentesis performed yielding 4.5 liters of straw colored fluid.  Fluid was sent to lab for analysis. No immediate complications. EBL is none. 5 Liter maximum.

## 2022-12-28 ENCOUNTER — Inpatient Hospital Stay: Payer: No Typology Code available for payment source

## 2022-12-28 ENCOUNTER — Encounter: Payer: Self-pay | Admitting: Oncology

## 2022-12-28 ENCOUNTER — Inpatient Hospital Stay: Payer: No Typology Code available for payment source | Admitting: Nutrition

## 2022-12-28 VITALS — BP 106/71 | HR 88 | Temp 97.7°F | Resp 18 | Ht 71.0 in | Wt 138.2 lb

## 2022-12-28 DIAGNOSIS — C250 Malignant neoplasm of head of pancreas: Secondary | ICD-10-CM

## 2022-12-28 DIAGNOSIS — Z5111 Encounter for antineoplastic chemotherapy: Secondary | ICD-10-CM | POA: Diagnosis not present

## 2022-12-28 LAB — CMP (CANCER CENTER ONLY)
ALT: 13 U/L (ref 0–44)
AST: 19 U/L (ref 15–41)
Albumin: 2.5 g/dL — ABNORMAL LOW (ref 3.5–5.0)
Alkaline Phosphatase: 223 U/L — ABNORMAL HIGH (ref 38–126)
Anion gap: 7 (ref 5–15)
BUN: 32 mg/dL — ABNORMAL HIGH (ref 6–20)
CO2: 26 mmol/L (ref 22–32)
Calcium: 8.5 mg/dL — ABNORMAL LOW (ref 8.9–10.3)
Chloride: 95 mmol/L — ABNORMAL LOW (ref 98–111)
Creatinine: 0.68 mg/dL (ref 0.61–1.24)
GFR, Estimated: >60 mL/min (ref >60)
Glucose, Bld: 79 mg/dL (ref 70–99)
Potassium: 4.5 mmol/L (ref 3.5–5.1)
Sodium: 128 mmol/L — ABNORMAL LOW (ref 135–145)
Total Bilirubin: 0.4 mg/dL (ref 0.3–1.2)
Total Protein: 5.6 g/dL — ABNORMAL LOW (ref 6.5–8.1)

## 2022-12-28 LAB — CBC WITH DIFFERENTIAL (CANCER CENTER ONLY)
Abs Immature Granulocytes: 0.05 10*3/uL (ref 0.00–0.07)
Basophils Absolute: 0 10*3/uL (ref 0.0–0.1)
Basophils Relative: 0 %
Eosinophils Absolute: 0 10*3/uL (ref 0.0–0.5)
Eosinophils Relative: 0 %
HCT: 24.9 % — ABNORMAL LOW (ref 39.0–52.0)
Hemoglobin: 8.2 g/dL — ABNORMAL LOW (ref 13.0–17.0)
Immature Granulocytes: 1 %
Lymphocytes Relative: 3 %
Lymphs Abs: 0.3 10*3/uL — ABNORMAL LOW (ref 0.7–4.0)
MCH: 35.8 pg — ABNORMAL HIGH (ref 26.0–34.0)
MCHC: 32.9 g/dL (ref 30.0–36.0)
MCV: 108.7 fL — ABNORMAL HIGH (ref 80.0–100.0)
Monocytes Absolute: 0.4 10*3/uL (ref 0.1–1.0)
Monocytes Relative: 3 %
Neutro Abs: 10.3 10*3/uL — ABNORMAL HIGH (ref 1.7–7.7)
Neutrophils Relative %: 93 %
Platelet Count: 187 10*3/uL (ref 150–400)
RBC: 2.29 MIL/uL — ABNORMAL LOW (ref 4.22–5.81)
RDW: 17.1 % — ABNORMAL HIGH (ref 11.5–15.5)
WBC Count: 11.1 10*3/uL — ABNORMAL HIGH (ref 4.0–10.5)
nRBC: 0 % (ref 0.0–0.2)

## 2022-12-28 MED ORDER — SODIUM CHLORIDE 0.9 % IV SOLN
2000.0000 mg/m2 | INTRAVENOUS | Status: DC
  Administered 2022-12-28: 3600 mg via INTRAVENOUS
  Filled 2022-12-28: qty 72

## 2022-12-28 MED ORDER — SODIUM CHLORIDE 0.9 % IV SOLN
400.0000 mg/m2 | Freq: Once | INTRAVENOUS | Status: AC
  Administered 2022-12-28: 716 mg via INTRAVENOUS
  Filled 2022-12-28: qty 35.8

## 2022-12-28 MED ORDER — PALONOSETRON HCL INJECTION 0.25 MG/5ML
0.2500 mg | Freq: Once | INTRAVENOUS | Status: AC
  Administered 2022-12-28: 0.25 mg via INTRAVENOUS
  Filled 2022-12-28: qty 5

## 2022-12-28 MED ORDER — SODIUM CHLORIDE 0.9 % IV SOLN
70.0000 mg/m2 | Freq: Once | INTRAVENOUS | Status: AC
  Administered 2022-12-28: 124.7 mg via INTRAVENOUS
  Filled 2022-12-28: qty 29

## 2022-12-28 MED ORDER — ATROPINE SULFATE 1 MG/ML IV SOLN
0.5000 mg | Freq: Once | INTRAVENOUS | Status: DC | PRN
  Filled 2022-12-28: qty 1

## 2022-12-28 MED ORDER — SODIUM CHLORIDE 0.9 % IV SOLN: Freq: Once | INTRAVENOUS | Status: AC

## 2022-12-28 MED ORDER — SODIUM CHLORIDE 0.9 % IV SOLN
10.0000 mg | Freq: Once | INTRAVENOUS | Status: AC
  Administered 2022-12-28: 10 mg via INTRAVENOUS
  Filled 2022-12-28: qty 10

## 2022-12-28 NOTE — Patient Instructions (Signed)
[facility]   Discharge Instructions: Thank you for choosing [facility] to provide your oncology and hematology care.   If you have a lab appointment with the [facility], please go directly to the [facility] and check in at the registration area.   Wear comfortable clothing and clothing appropriate for easy access to any Portacath or PICC line.   We strive to give you quality time with your provider. You may need to reschedule your appointment if you arrive late (15 or more minutes).  Arriving late affects you and other patients whose appointments are after yours.  Also, if you miss three or more appointments without notifying the office, you may be dismissed from the clinic at the provider's discretion.      For prescription refill requests, have your pharmacy contact our office and allow 72 hours for refills to be completed.    Today you received the following chemotherapy and/or immunotherapy agents Liposomal irinotecan (ONIVYDE), Leucovorin & Flourouracil (ADRUCIL).      To help prevent nausea and vomiting after your treatment, we encourage you to take your nausea medication as directed.  BELOW ARE SYMPTOMS THAT SHOULD BE REPORTED IMMEDIATELY: *FEVER GREATER THAN 100.4 F (38 C) OR HIGHER *CHILLS OR SWEATING *NAUSEA AND VOMITING THAT IS NOT CONTROLLED WITH YOUR NAUSEA MEDICATION *UNUSUAL SHORTNESS OF BREATH *UNUSUAL BRUISING OR BLEEDING *URINARY PROBLEMS (pain or burning when urinating, or frequent urination) *BOWEL PROBLEMS (unusual diarrhea, constipation, pain near the anus) TENDERNESS IN MOUTH AND THROAT WITH OR WITHOUT PRESENCE OF ULCERS (sore throat, sores in mouth, or a toothache) UNUSUAL RASH, SWELLING OR PAIN  UNUSUAL VAGINAL DISCHARGE OR ITCHING   Items with * indicate a potential emergency and should be followed up as soon as possible or go to the Emergency Department if any problems should occur.  Please show the  CHEMOTHERAPY ALERT CARD or IMMUNOTHERAPY ALERT CARD at check-in to the Emergency Department and triage nurse.  Should you have questions after your visit or need to cancel or reschedule your appointment, please contact [facility]  Dept: [phone]  and follow the prompts.  Office hours are 8:00 a.m. to 4:30 p.m. Monday - Friday. Please note that voicemails left after 4:00 p.m. may not be returned until the following business day.  We are closed weekends and major holidays. You have access to a nurse at all times for urgent questions. Please call the main number to the clinic Dept: [phone] and follow the prompts.   For any non-urgent questions, you may also contact your provider using MyChart. We now offer e-Visits for anyone 44 and older to request care online for non-urgent symptoms. For details visit [email or web address].   Also download the MyChart app! Go to the app store, search "MyChart", open the app, select [facility], and log in with your MyChart username and password.  Irinotecan Liposome Injection What is this medication? IRINOTECAN LIPOSOME (eye ri noe TEE kan LIP oh som) treats pancreatic cancer. It works by slowing down the growth of cancer cells. This medicine may be used for other purposes; ask your health care provider or pharmacist if you have questions. COMMON BRAND NAME(S): ONIVYDE What should I tell my care team before I take this medication? They need to know if you have any of these conditions: Blockage in your bowels Dehydration Infection Low white blood cell levels Lung disease An unusual or allergic reaction to irinotecan liposome, irinotecan, other medications, foods,  dyes, or preservatives Pregnant or trying to get pregnant Breast-feeding How should I use this medication? This medication is injected into a vein. It is given by your care team in a hospital or clinic setting. Talk to your care team about the use of  this medication in children. Special care may be needed. Overdosage: If you think you have taken too much of this medicine contact a poison control center or emergency room at once. NOTE: This medicine is only for you. Do not share this medicine with others. What if I miss a dose? Keep appointments for follow-up doses. It is important not to miss your dose. Call your care team if you are unable to keep an appointment. What may interact with this medication? Do not take this medication with any of the following: Itraconazole This medication may also interact with the following: Certain antivirals for HIV or AIDS Certain medications for seizures, such as carbamazepine, fosphenytoin, phenytoin, phenobarbital Clarithromycin Gemfibrozil Nefazodone Rifabutin Rifampin Rifapentine St. John's Wort Voriconazole This list may not describe all possible interactions. Give your health care provider a list of all the medicines, herbs, non-prescription drugs, or dietary supplements you use. Also tell them if you smoke, drink alcohol, or use illegal drugs. Some items may interact with your medicine. What should I watch for while using this medication? This medication may make you feel generally unwell. This is not uncommon as chemotherapy can affect healthy cells as well as cancer cells. Report any side effects. Continue your course of treatment even though you feel ill unless your care team tells you to stop. You may need blood work while you are taking this medication. This medication can cause serious side effects and allergic reactions. To reduce your risk, your care team may give you other medications to take before receiving this one. Be sure to follow the directions from your care team. Check with your care team if you get an attack of diarrhea, nausea and vomiting, or if you sweat a lot. The loss of too much body fluid can make it dangerous for you to take this medication. This medication may cause  infertility. Talk to your care team if you are concerned about your fertility. Talk to your care team if you wish to become pregnant or if you think you might be pregnant. This medication can cause serious birth defects if taken during pregnancy or if you get pregnant within 7 months after stopping therapy. A negative pregnancy test is required before starting this medication. A reliable form of contraception is recommended while taking this medication and for 7 months after stopping it. Talk to your care team about reliable forms of contraception. Use a condom during sex and for 4 months after stopping therapy. Tell your care team right away if you think your partner might be pregnant. This medication can cause serious birth defects. Do not breast-feed while taking this medication and for 1 month after stopping therapy. This medication may increase your risk of getting an infection. Call your care team for advice if you get a fever, chills, sore throat, or other symptoms of a cold or flu. Do not treat yourself. Try to avoid being around people who are sick. Avoid taking medications that contain aspirin, acetaminophen, ibuprofen, naproxen, or ketoprofen unless instructed by your care team. These medications may hide a fever. Be careful brushing or flossing your teeth or using a toothpick because you may get an infection or bleed more easily. If you have any dental work done,  tell your dentist you are receiving this medication. What side effects may I notice from receiving this medication? Side effects that you should report to your care team as soon as possible: Allergic reactions or angioedema--skin rash, itching or hives, swelling of the face, eyes, lips, tongue, arms, or legs, trouble swallowing or breathing Dry cough, shortness of breath or trouble breathing Diarrhea Infection--fever, chills, cough, or sore throat Side effects that usually do not require medical attention (report to your care team  if they continue or are bothersome): Fatigue Loss of appetite Nausea Pain, redness, or swelling with sores inside the mouth or throat Vomiting This list may not describe all possible side effects. Call your doctor for medical advice about side effects. You may report side effects to FDA at 1-800-FDA-1088. Where should I keep my medication? This medication is given in a hospital or clinic. It will not be stored at home. NOTE: This sheet is a summary. It may not cover all possible information. If you have questions about this medicine, talk to your doctor, pharmacist, or health care provider.  2023 Elsevier/Gold Standard (2022-01-20 00:00:00)   Leucovorin Injection What is this medication? LEUCOVORIN (loo koe VOR in) prevents side effects from certain medications, such as methotrexate. It works by increasing folate levels. This helps protect healthy cells in your body. It may also be used to treat anemia caused by low levels of folate. It can also be used with fluorouracil, a type of chemotherapy, to treat colorectal cancer. It works by increasing the effects of fluorouracil in the body. This medicine may be used for other purposes; ask your health care provider or pharmacist if you have questions. What should I tell my care team before I take this medication? They need to know if you have any of these conditions: Anemia from low levels of vitamin B12 in the blood An unusual or allergic reaction to leucovorin, folic acid, other medications, foods, dyes, or preservatives Pregnant or trying to get pregnant Breastfeeding How should I use this medication? This medication is injected into a vein or a muscle. It is given by your care team in a hospital or clinic setting. Talk to your care team about the use of this medication in children. Special care may be needed. Overdosage: If you think you have taken too much of this medicine contact a poison control center or emergency room at once. NOTE:  This medicine is only for you. Do not share this medicine with others. What if I miss a dose? Keep appointments for follow-up doses. It is important not to miss your dose. Call your care team if you are unable to keep an appointment. What may interact with this medication? Capecitabine Fluorouracil Phenobarbital Phenytoin Primidone Trimethoprim;sulfamethoxazole This list may not describe all possible interactions. Give your health care provider a list of all the medicines, herbs, non-prescription drugs, or dietary supplements you use. Also tell them if you smoke, drink alcohol, or use illegal drugs. Some items may interact with your medicine. What should I watch for while using this medication? Your condition will be monitored carefully while you are receiving this medication. This medication may increase the side effects of 5-fluorouracil. Tell your care team if you have diarrhea or mouth sores that do not get better or that get worse. What side effects may I notice from receiving this medication? Side effects that you should report to your care team as soon as possible: Allergic reactions--skin rash, itching, hives, swelling of the face, lips, tongue, or  throat This list may not describe all possible side effects. Call your doctor for medical advice about side effects. You may report side effects to FDA at 1-800-FDA-1088. Where should I keep my medication? This medication is given in a hospital or clinic. It will not be stored at home. NOTE: This sheet is a summary. It may not cover all possible information. If you have questions about this medicine, talk to your doctor, pharmacist, or health care provider.  2023 Elsevier/Gold Standard (2022-04-26 00:00:00)   Fluorouracil Injection What is this medication? FLUOROURACIL (flure oh YOOR a sil) treats some types of cancer. It works by slowing down the growth of cancer cells. This medicine may be used for other purposes; ask your health care  provider or pharmacist if you have questions. COMMON BRAND NAME(S): Adrucil What should I tell my care team before I take this medication? They need to know if you have any of these conditions: Blood disorders Dihydropyrimidine dehydrogenase (DPD) deficiency Infection, such as chickenpox, cold sores, herpes Kidney disease Liver disease Poor nutrition Recent or ongoing radiation therapy An unusual or allergic reaction to fluorouracil, other medications, foods, dyes, or preservatives If you or your partner are pregnant or trying to get pregnant Breast-feeding How should I use this medication? This medication is injected into a vein. It is administered by your care team in a hospital or clinic setting. Talk to your care team about the use of this medication in children. Special care may be needed. Overdosage: If you think you have taken too much of this medicine contact a poison control center or emergency room at once. NOTE: This medicine is only for you. Do not share this medicine with others. What if I miss a dose? Keep appointments for follow-up doses. It is important not to miss your dose. Call your care team if you are unable to keep an appointment. What may interact with this medication? Do not take this medication with any of the following: Live virus vaccines This medication may also interact with the following: Medications that treat or prevent blood clots, such as warfarin, enoxaparin, dalteparin This list may not describe all possible interactions. Give your health care provider a list of all the medicines, herbs, non-prescription drugs, or dietary supplements you use. Also tell them if you smoke, drink alcohol, or use illegal drugs. Some items may interact with your medicine. What should I watch for while using this medication? Your condition will be monitored carefully while you are receiving this medication. This medication may make you feel generally unwell. This is not  uncommon as chemotherapy can affect healthy cells as well as cancer cells. Report any side effects. Continue your course of treatment even though you feel ill unless your care team tells you to stop. In some cases, you may be given additional medications to help with side effects. Follow all directions for their use. This medication may increase your risk of getting an infection. Call your care team for advice if you get a fever, chills, sore throat, or other symptoms of a cold or flu. Do not treat yourself. Try to avoid being around people who are sick. This medication may increase your risk to bruise or bleed. Call your care team if you notice any unusual bleeding. Be careful brushing or flossing your teeth or using a toothpick because you may get an infection or bleed more easily. If you have any dental work done, tell your dentist you are receiving this medication. Avoid taking medications that contain  aspirin, acetaminophen, ibuprofen, naproxen, or ketoprofen unless instructed by your care team. These medications may hide a fever. Do not treat diarrhea with over the counter products. Contact your care team if you have diarrhea that lasts more than 2 days or if it is severe and watery. This medication can make you more sensitive to the sun. Keep out of the sun. If you cannot avoid being in the sun, wear protective clothing and sunscreen. Do not use sun lamps, tanning beds, or tanning booths. Talk to your care team if you or your partner wish to become pregnant or think you might be pregnant. This medication can cause serious birth defects if taken during pregnancy and for 3 months after the last dose. A reliable form of contraception is recommended while taking this medication and for 3 months after the last dose. Talk to your care team about effective forms of contraception. Do not father a child while taking this medication and for 3 months after the last dose. Use a condom while having sex during this  time period. Do not breastfeed while taking this medication. This medication may cause infertility. Talk to your care team if you are concerned about your fertility. What side effects may I notice from receiving this medication? Side effects that you should report to your care team as soon as possible: Allergic reactions--skin rash, itching, hives, swelling of the face, lips, tongue, or throat Heart attack--pain or tightness in the chest, shoulders, arms, or jaw, nausea, shortness of breath, cold or clammy skin, feeling faint or lightheaded Heart failure--shortness of breath, swelling of the ankles, feet, or hands, sudden weight gain, unusual weakness or fatigue Heart rhythm changes--fast or irregular heartbeat, dizziness, feeling faint or lightheaded, chest pain, trouble breathing High ammonia level--unusual weakness or fatigue, confusion, loss of appetite, nausea, vomiting, seizures Infection--fever, chills, cough, sore throat, wounds that don't heal, pain or trouble when passing urine, general feeling of discomfort or being unwell Low red blood cell level--unusual weakness or fatigue, dizziness, headache, trouble breathing Pain, tingling, or numbness in the hands or feet, muscle weakness, change in vision, confusion or trouble speaking, loss of balance or coordination, trouble walking, seizures Redness, swelling, and blistering of the skin over hands and feet Severe or prolonged diarrhea Unusual bruising or bleeding Side effects that usually do not require medical attention (report to your care team if they continue or are bothersome): Dry skin Headache Increased tears Nausea Pain, redness, or swelling with sores inside the mouth or throat Sensitivity to light Vomiting This list may not describe all possible side effects. Call your doctor for medical advice about side effects. You may report side effects to FDA at 1-800-FDA-1088. Where should I keep my medication? This medication is  given in a hospital or clinic. It will not be stored at home. NOTE: This sheet is a summary. It may not cover all possible information. If you have questions about this medicine, talk to your doctor, pharmacist, or health care provider.  2023 Elsevier/Gold Standard (2022-03-22 00:00:00)   The chemotherapy medication bag should finish at 46 hours, 96 hours, or 7 days. For example, if your pump is scheduled for 46 hours and it was put on at 4:00 p.m., it should finish at 2:00 p.m. the day it is scheduled to come off regardless of your appointment time.     Estimated time to finish at 12:45 p.m. on Friday 12/30/2022.   If the display on your pump reads "Low Volume" and it is beeping, take  the batteries out of the pump and come to the cancer center for it to be taken off.   If the pump alarms go off prior to the pump reading "Low Volume" then call [phone] and someone can assist you.  If the plunger comes out and the chemotherapy medication is leaking out, please use your home chemo spill kit to clean up the spill. Do NOT use paper towels or other household products.  If you have problems or questions regarding your pump, please call either [phone] (24 hours a day) or the cancer center Monday-Friday 8:00 a.m.- 4:30 p.m. at the clinic number and we will assist you. If you are unable to get assistance, then go to the nearest Emergency Department and ask the staff to contact the IV team for assistance.

## 2022-12-28 NOTE — Progress Notes (Signed)
Nutrition follow up completed with patient during infusion. Receiving Liposomal Irinotecan + Leucovorin + 5-FU IVCI q14d for Pancreas cancer.  Weight decreased to 138# 4oz from 141# 10oz on Jan 16. S/P paracentesis on Jan 23 removing 4.5 L.  Labs include Na 128, BUN 32, and Alb 2.5  Patient reports he is ok. He continues to drink Premier Protein, only providing 150 calories and 30 grams protein. I encouraged him to try Ensure Complete with 350 kcal and 30 grams protein. Explained the importance of calories as well as protein. He was willing to take a sample of Vanilla Ensure Complete home with him but did not want to try it during infusion.  Nutrition Diagnosis: Severe Malnutrition related to pancreas cancer as evidenced by severe loss of muscle and fat stores, ascites and inadequate oral intake. Food and Nutrition Related Knowledge deficit continues.  Interventions: D/C Premier Protein.  Drink Ensure Complete 2-3 cartons daily. Provided samples and coupons. Increase calories and protein throughout the day. Continue bowel regimen.  Monitoring, Evaluation, Goals: Tolerate adequate calories and protein to improve QOL.  Next Visit: To be scheduled as needed.

## 2022-12-29 ENCOUNTER — Telehealth: Payer: Self-pay

## 2022-12-29 ENCOUNTER — Encounter: Payer: Self-pay | Admitting: Oncology

## 2022-12-29 NOTE — Telephone Encounter (Signed)
24 Hour Call Back  Telephone call to patient post first time Liposomal Irinotecan. He denies any concerns or symptoms. He knows to call the clinic with any questions. Patient will be back at 1245 on Friday 12/30/22 for his pump stop appointment.

## 2022-12-30 ENCOUNTER — Inpatient Hospital Stay: Payer: No Typology Code available for payment source

## 2022-12-30 VITALS — BP 95/79 | HR 100 | Resp 18

## 2022-12-30 DIAGNOSIS — Z5111 Encounter for antineoplastic chemotherapy: Secondary | ICD-10-CM | POA: Diagnosis not present

## 2022-12-30 DIAGNOSIS — C250 Malignant neoplasm of head of pancreas: Secondary | ICD-10-CM

## 2022-12-30 LAB — CANCER ANTIGEN 19-9: CA 19-9: 3308 U/mL — ABNORMAL HIGH (ref 0–35)

## 2022-12-30 MED ORDER — HEPARIN SOD (PORK) LOCK FLUSH 100 UNIT/ML IV SOLN
500.0000 [IU] | Freq: Once | INTRAVENOUS | Status: AC | PRN
  Administered 2022-12-30: 500 [IU]

## 2022-12-30 MED ORDER — SODIUM CHLORIDE 0.9% FLUSH
10.0000 mL | INTRAVENOUS | Status: DC | PRN
  Administered 2022-12-30: 10 mL

## 2022-12-30 NOTE — Progress Notes (Signed)
Patient's wife had called office stating patient was having more acid reflux after this chemo cycle.  Patient has been taking his zofran as prescribed but patient and wife wanted to know if there was anything else he could take.  Per Ned Card, NP ok for patient to take either tums or pepcid for acid reflux.  Instructed patient and/or wife to contact office if refulx becomes worse or if pecid or tums do not help. Patient and wife made aware and both verbalized understanding. All questions were answered during visit.

## 2023-01-02 ENCOUNTER — Telehealth: Payer: Self-pay | Admitting: *Deleted

## 2023-01-02 DIAGNOSIS — C250 Malignant neoplasm of head of pancreas: Secondary | ICD-10-CM

## 2023-01-02 NOTE — Telephone Encounter (Signed)
Mrs. Thorington called to request paracentesis for patient on 01/05/23 latest afternoon possible so she can transport him. Lasted they can do is 1:30 arrival for 2 pm paracentesis. Wife notified.

## 2023-01-03 ENCOUNTER — Telehealth: Payer: Self-pay

## 2023-01-03 NOTE — Telephone Encounter (Signed)
Spoke with pt's wife regarding pt's status after last chemo tx. Pt's wife reports that he is still very weak and after chemo tx on 12/28/22. She reports no diarrhea, vomiting, one episode, on 12/30/2022, and mild constipation relieved with miralax. Reports pt is not having any dizziness when standing and is maintaining urine output which is dark in color.   This RN offered pt appt for IVF, declined at this time. Encouraged pt's wife to continue to encourage pt to drink fluids and rest.   Pt's wife verbalizes understanding and states that they will call [facility] for an appointment for IVF if needed.

## 2023-01-04 ENCOUNTER — Telehealth: Payer: Self-pay | Admitting: *Deleted

## 2023-01-04 NOTE — Telephone Encounter (Signed)
Not eating or drinking and very weak. Patient/wife are requesting IV fluids to see if this helps him feel better. Would like fluids on 2/01 before he goes for his paracentesis at 1:30 pm.

## 2023-01-04 NOTE — Telephone Encounter (Addendum)
Informed [name] that MD will allow a liter of NS tomorrow, but keep in mind this could increase his ascites and the fluids may only give him a few hours of feeling better. MD feels most likely this is disease progression.  They decided not to purse the IV fluids.

## 2023-01-05 ENCOUNTER — Telehealth: Payer: Self-pay | Admitting: *Deleted

## 2023-01-05 ENCOUNTER — Ambulatory Visit (HOSPITAL_COMMUNITY)
Admission: RE | Admit: 2023-01-05 | Discharge: 2023-01-05 | Disposition: A | Discharge status: Home / Self Care | Payer: No Typology Code available for payment source | Source: Ambulatory Visit | Attending: Oncology | Admitting: Oncology

## 2023-01-05 DIAGNOSIS — C250 Malignant neoplasm of head of pancreas: Secondary | ICD-10-CM | POA: Insufficient documentation

## 2023-01-05 DIAGNOSIS — R180 Malignant ascites: Secondary | ICD-10-CM | POA: Insufficient documentation

## 2023-01-05 MED ORDER — LIDOCAINE HCL 1 % IJ SOLN
INTRAMUSCULAR | Status: AC
  Administered 2023-01-05: 10 mL
  Filled 2023-01-05: qty 20

## 2023-01-05 NOTE — Procedures (Signed)
PROCEDURE SUMMARY:  Successful image-guided paracentesis from the right lower abdomen.  Yielded 5.0 liters of clear yellow fluid.  No immediate complications.  EBL = trace. Patient tolerated well.   Specimen was not sent for labs.  Please see imaging section of Epic for full dictation.   Lura Em PA-C 01/05/2023 2:45 PM

## 2023-01-05 NOTE — Telephone Encounter (Signed)
Mrs. Odoherty called to request a Hospice referral. Wants to meet with them to determine if he is appropriate for this now and what they can offer her. She is very stressed and he has declined significantly and does not know which way to turn. Should he continue chemo? Informed her that Dr. Benay Spice does feel he is hospice appropriate. Referral order placed.

## 2023-01-06 ENCOUNTER — Telehealth: Payer: Self-pay

## 2023-01-06 NOTE — Telephone Encounter (Signed)
Mrs. Reinhardt called to report hospice came in and they decided to go with hospice. She canceled all the his lab/flush/treatment appointment and change is office visit to a telephone visit.

## 2023-01-10 ENCOUNTER — Inpatient Hospital Stay: Payer: No Typology Code available for payment source

## 2023-01-10 ENCOUNTER — Inpatient Hospital Stay: Payer: No Typology Code available for payment source | Attending: Oncology | Admitting: Oncology

## 2023-01-10 DIAGNOSIS — C250 Malignant neoplasm of head of pancreas: Secondary | ICD-10-CM

## 2023-01-10 NOTE — Progress Notes (Signed)
[facility] OFFICE VISIT PROGRESS NOTE  I connected with Christy Mustin by telephone Patient's location: Home Provider's location: Office  Diagnosis: Increased cancer  INTERVAL HISTORY:   Mr. Beckford has metastatic pancreas cancer.  He completed cycle salvage therapy with 5-FU/liposomal irinotecan on 12/28/2022.  He had an episode of nausea and vomiting on day 3.  He has developed progressive failure to thrive over the past few weeks.  He is now bedbound and has enrolled in home hospice care.  He was unable to speak on the telephone.  His wife reports he is receiving lorazepam and liquid morphine.  The hospice team is visiting.  She has multiple family members to help with his care in the home.  She indicates the hospice team expects his lifespan will be less than 1 week.   Lab Results:  Lab Results  Component Value Date   WBC 11.1 (H) 12/28/2022   HGB 8.2 (L) 12/28/2022   HCT 24.9 (L) 12/28/2022   MCV 108.7 (H) 12/28/2022   PLT 187 12/28/2022   NEUTROABS 10.3 (H) 12/28/2022    Imaging:  No results found.  Medications: I have reviewed the patient's current medications.  Assessment/Plan: Pancreas cancer, status post a pancreaticoduodenectomy 11/12/2021, stage IIb (pT1,pN1) 1/11 lymph nodes, perineural invasion positive, positive margin at the SMA and SMV MRCP 09/27/2021-diffuse intrahepatic and common bile duct dilatation with abrupt termination at the level of the head of the pancreas, indistinct area of hypoenhancement in the pancreas head ERCP with placement of pancreatic duct and bile duct stents 10/04/2021 EUS 1 10/04/2021-no pancreas mass identified, FNA of pancreas head-"atypical "cells, no adenopathy CT abdomen/pelvis 10/18/2021-no focal liver abnormality, intra and extrahepatic biliary ductal dilatation with narrowing of the common duct and the pancreas head, no enlarged abdominal lymph nodes Cycle 1 FOLFIRINOX  12/27/2021, irinotecan held with cycle 1 Cycle 2 FOLFIRINOX 01/24/2022, oxaliplatin and irinotecan dose reduced secondary to elevated liver enzymes, Udenyca Cycle 3 FOLFIRINOX 02/07/2022, same doses as cycle 2 Cycle 4 FOLFIRINOX 02/21/2022 Cycle 5 FOLFIRINOX 03/07/2022 Cycle 6 FOLFIRINOX 03/22/2022 Cycle 7 FOLFIRINOX 04/05/2022 Cycle 8 FOLFIRINOX 04/19/2022 Radiation/Xeloda 05/09/2022-06/16/2022 Paracentesis 09/06/2022-reactive mesothelial cells; 11/25/2022, 12/08/2022, 12/19/2022 cytology positive for malignant cells. CTs 09/08/2022-multiple new right lobe liver lesions, ill-defined soft tissue pancreatic anastomosis, moderate volume ascites Ultrasound-guided biopsy of a right liver lesion 09/15/2022-moderate to poorly differentiated adenocarcinoma consistent with pancreas cancer; foundation 1-microsatellite stable, tumor mutation burden 2, K-ras G12D Cycle 1 gemcitabine/Abraxane 10/04/2022 Cycle 2 gemcitabine/Abraxane 10/25/2022, chemotherapy dose reduced due to neutropenia and thrombocytopenia, Udenyca Cycle 3 gemcitabine/Abraxane 11/08/2022, Udenyca Cycle 4 gemcitabine/Abraxane 11/22/2022, Udenyca, gemcitabine and Abraxane doses escalated Cycle 5 gemcitabine/Abraxane 12/07/2022, Udenyca CTs 12/16/2022-increasing size and number of liver metastases.  Increasing ascites with slight mass effect.  Subtle areas of thickening along the course of the peritoneum Cycle one 5-FU/liposomal irinotecan 12/28/2022. Acute pancreatitis 10/04/2021 Bipolar disorder Left soleal DVT 11/26/2021-apixaban, discontinued 07/14/2022 Port-A-Cath placement 12/22/2021  Atrial fibrillation 12/22/2021, converted to sinus rhythm, discharged home 12/23/2021 on metoprolol 12.5 mg twice daily, on Eliquis for prior DVT. Neutropenia secondary to chemotherapy 01/10/2022, Udenyca added with cycle 2 Elevated liver enzymes, predating chemotherapy Neutropenia/progressive thrombocytopenia following cycle 1 gemcitabine/Abraxane-Udenyca added, chemotherapy  dose reduced        Disposition: Mr Frison as advanced metastatic pancreas cancer with disease progression following multiple systemic therapy regimens.  He is enrolled in home hospice care.  I discussed the current situation with his wife.  I asked her to contact me if there is  any way we can help with his care.  She appears comfortable with the home care and hospice.  He is not scheduled for follow-up at the Cancer center.  We are available to see him and dissipate in his care as needed.   Betsy Coder ANP/GNP-BC   01/10/2023 8:09 AM

## 2023-01-12 ENCOUNTER — Encounter: Payer: Self-pay | Admitting: Oncology

## 2023-01-12 ENCOUNTER — Inpatient Hospital Stay: Payer: No Typology Code available for payment source

## 2023-01-24 ENCOUNTER — Inpatient Hospital Stay: Payer: No Typology Code available for payment source | Admitting: Oncology

## 2023-01-24 ENCOUNTER — Inpatient Hospital Stay: Payer: No Typology Code available for payment source

## 2023-01-26 ENCOUNTER — Inpatient Hospital Stay: Payer: No Typology Code available for payment source

## 2023-02-03 DEATH — deceased

## 2023-10-12 IMAGING — CT CT ABD-PELV W/ CM
2 of 5 series · 15 of 46 positions shown, 17 images · IV contrast (APPLIED)
Comparison: CT abdomen and pelvis 10/18/2021.

CLINICAL DATA: Status post Whipple, possible leak.

EXAM:
CT ABDOMEN AND PELVIS WITH CONTRAST
TECHNIQUE: Multidetector CT imaging of the abdomen and pelvis was performed
using the standard protocol following bolus administration of
intravenous contrast.
CONTRAST:  100mL OMNIPAQUE IOHEXOL 300 MG/ML  SOLN

[Series 3: abdomen 5.0 · axial · 0.98mm/px · z∈[+773,+1203]mm · 12 of 100 slices shown, 14 images]
[im 7/100  soft-tissue]
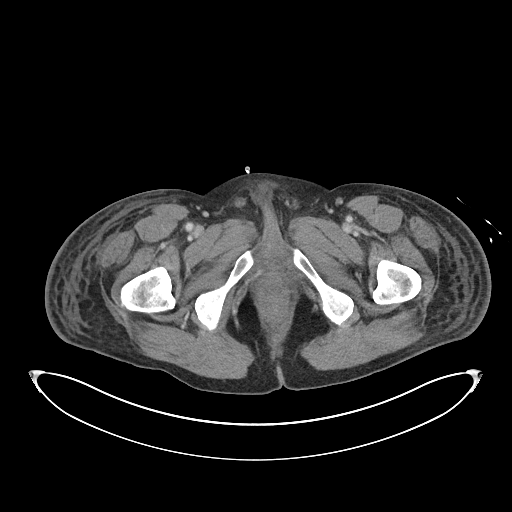
[im 7/100  bone]
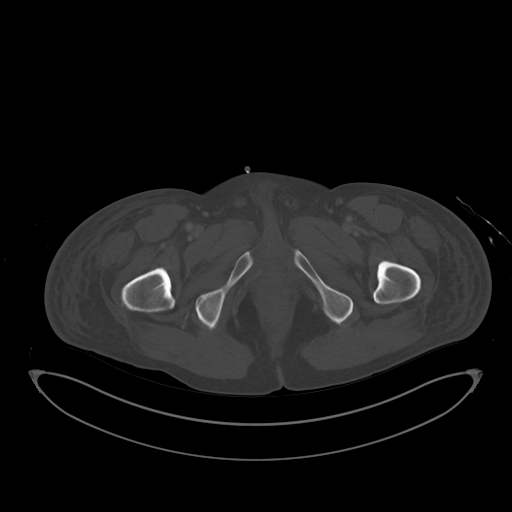
[im 14/100  soft-tissue]
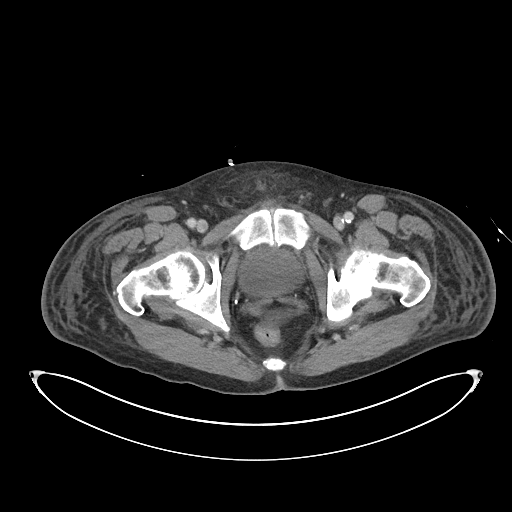
[im 20/100  soft-tissue]
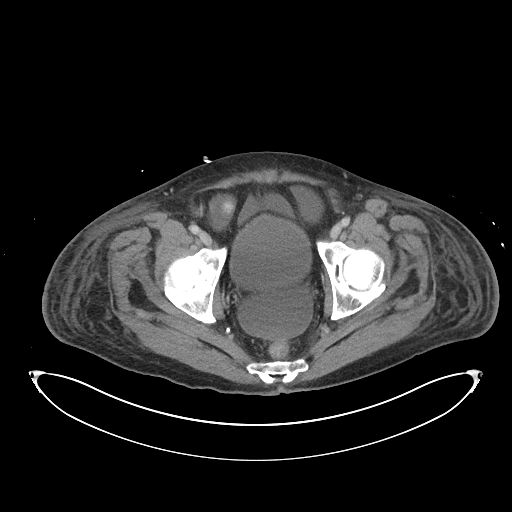
[im 34/100  soft-tissue]
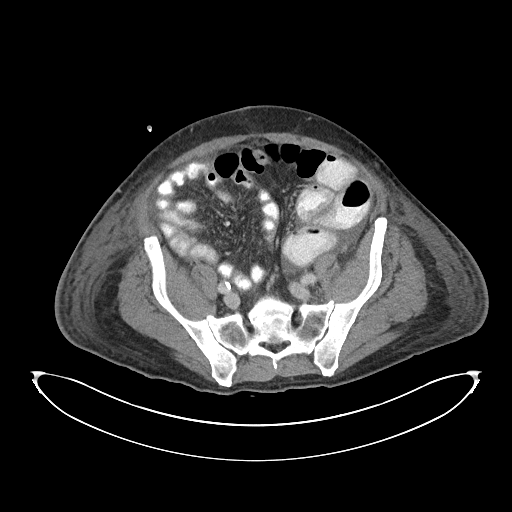
[im 40/100  soft-tissue]
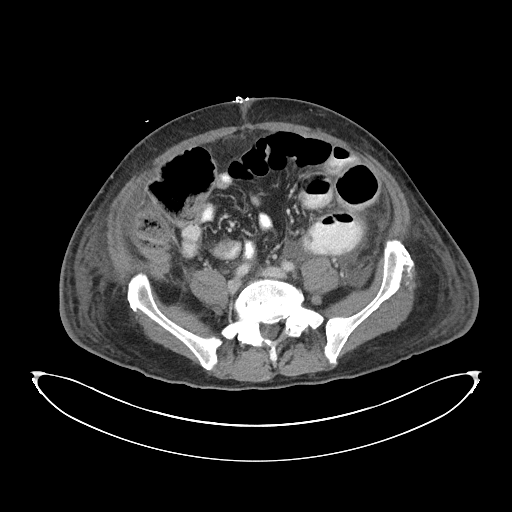
[im 47/100  soft-tissue]
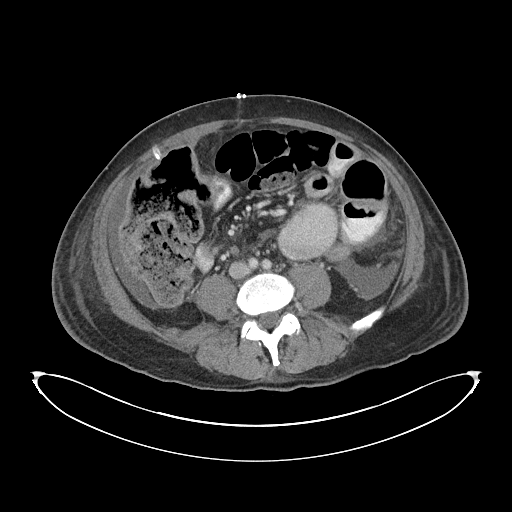
[im 53/100  soft-tissue]
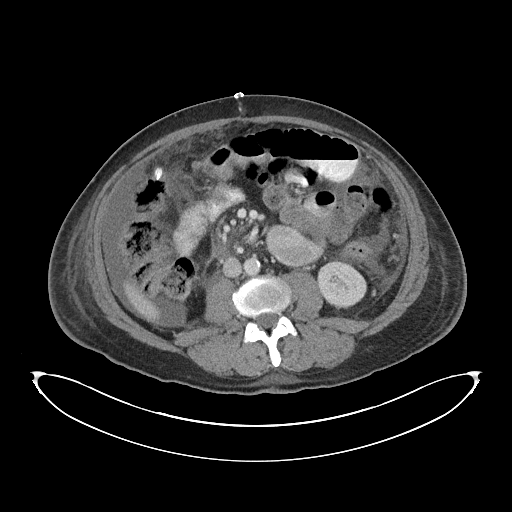
[im 60/100  soft-tissue]
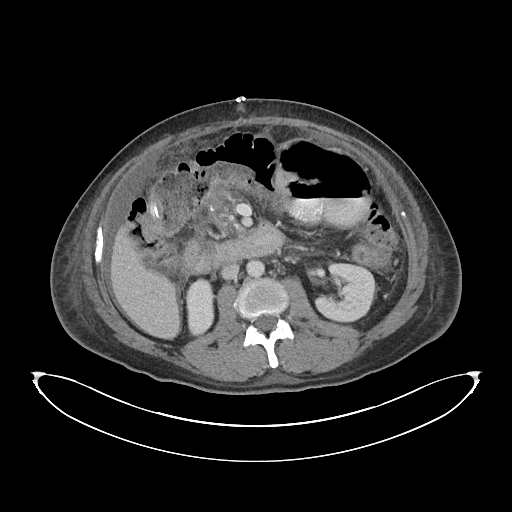
[im 67/100  soft-tissue]
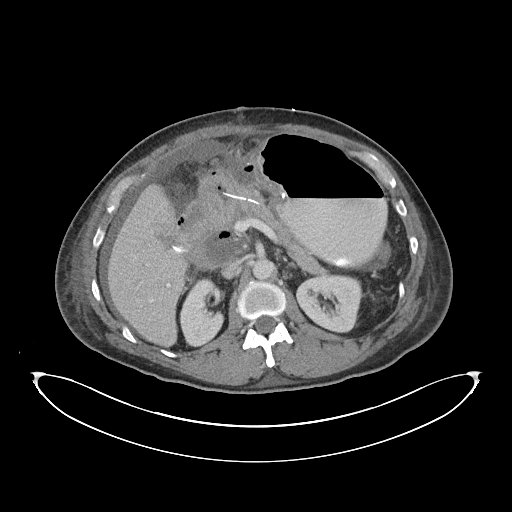
[im 67/100  bone]
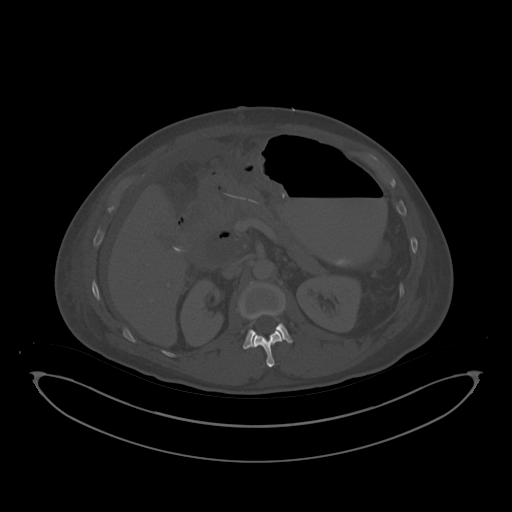
[im 80/100  soft-tissue]
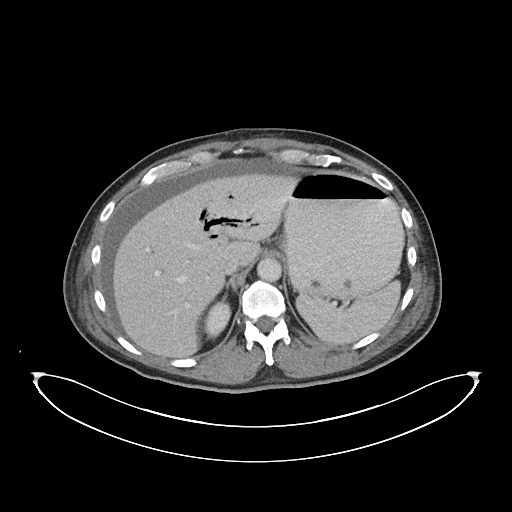
[im 86/100  soft-tissue]
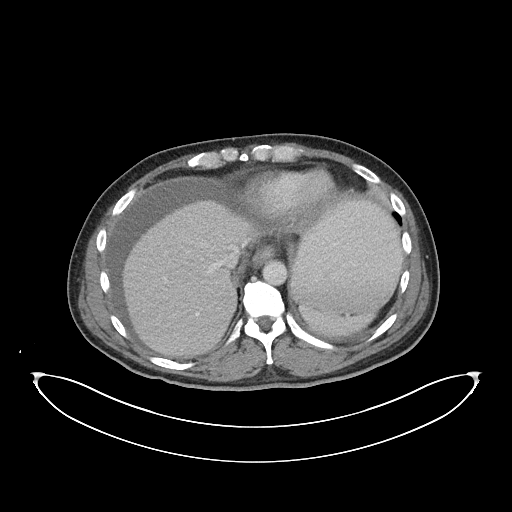
[im 93/100  soft-tissue]
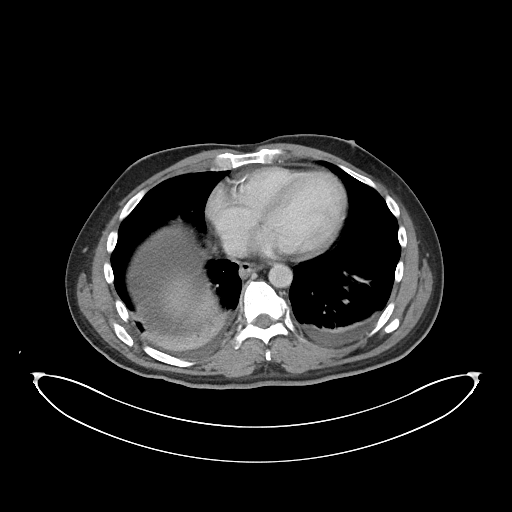

[Series 6: abdomen 3.0 mpr cor · coronal · 0.88mm/px · 3 of 101 slices shown]
[im 34/101  soft-tissue]
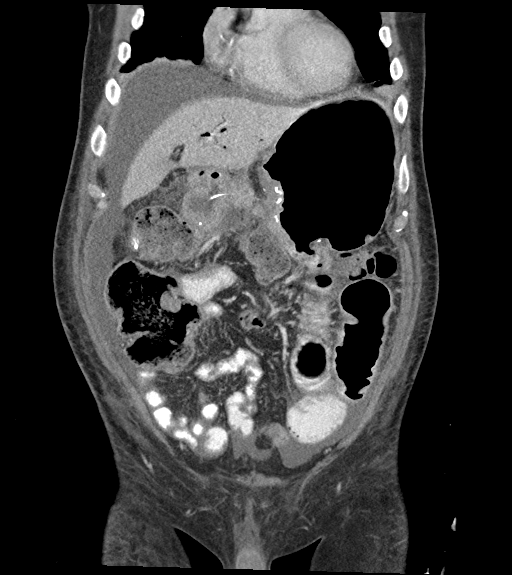
[im 45/101  soft-tissue]
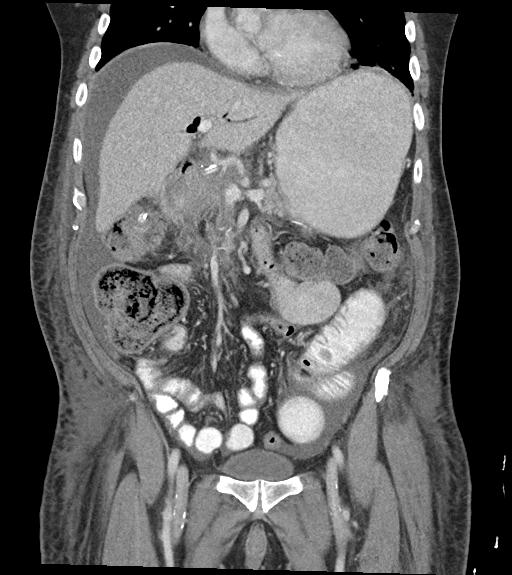
[im 56/101  soft-tissue]
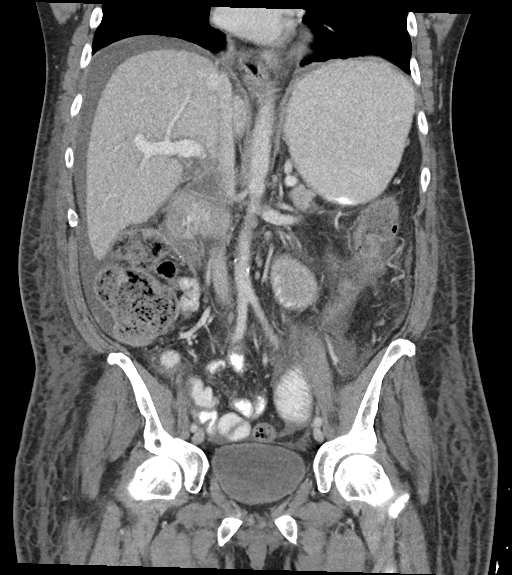

[15 of 46 positions shown; findings below may reference images not displayed]

FINDINGS: Lower chest: There are new small bilateral pleural effusions with
bilateral lower lobe atelectasis.

Hepatobiliary: No focal liver lesions are identified. The
gallbladder is now surgically absent. There has been interval
resolution of intra and extrahepatic biliary ductal dilatation.
There is new pneumobilia in the left lobe of the liver.

Pancreas: Patient is status post Whipple procedure. Small stent is
seen between the pancreatic body and adjacent small bowel. There is
no pancreatic ductal dilatation. The pancreatic body and tail appear
within normal limits.

Spleen: Normal in size without focal abnormality.

Adrenals/Urinary Tract: There is a small amount of air in the
bladder. The kidneys and adrenal glands are within normal limits.

Stomach/Bowel: Patient is status post Whipple procedure. There is a
small amount of free fluid and air in the surgical bed which appears
within normal limits. Gastrojejunostomy appears within normal limits
with some mild wall thickening of the jejunum at the anastomosis.
There is a large air contrast level within the stomach. Left-sided
small bowel loops are dilated measuring up to 3.7 cm. Oral contrast
reaches the distal ileum. Definitive transition point not
visualized.

The colon appears within normal limits. The appendix is not
visualized.

There is marked wall thickening of the duodenum with surrounding
inflammatory compatible with recent surgery. There is an air-fluid
collection abutting the pancreatic head and duodenum just inferior
to the portacaval region measuring 2.7 by 3.1 x 4.0 cm image 3/33.

Vascular/Lymphatic: Aortic atherosclerosis. No enlarged abdominal or
pelvic lymph nodes.

Reproductive: Prostate is unremarkable.

Other: There is small volume ascites throughout the abdomen and
pelvis. Percutaneous drainage catheter seen entering the right
abdomen with distal portion in the surgical bed. The distal portion
of the catheter abuts the previously mentioned fluid collection, but
does not definitely enter the fluid collection. There is diffuse
body wall edema. Midline skin staples are present.

Musculoskeletal: There are degenerative changes at L5-S1.
IMPRESSION: 1. Status post Whipple procedure with small amount of expected fluid
and free air in the surgical bed.
2. Additionally, there is an air-fluid collection in the surgical
bed measuring 2.7 x 3.1 x 4.0 cm, indeterminate. The percutaneous
drainage catheter abuts the anterior margin of the collection, but
does not definitely enter the collection.
3. Gastrojejunostomy appears patent. There is mild dilatation of the
stomach and proximal small bowel favored as ileus. Oral contrast
reaches distal ileum.
4. Cholecystectomy. New pneumobilia compatible with recent Whipple's
procedure.
5. Remaining pancreatic body and tail appear within normal limits.
6. Small volume ascites.
7. Small pleural effusions and bibasilar atelectasis.
8. Body wall edema.
9. Small amount air in the bladder.

## 2023-11-12 IMAGING — DX DG CHEST 1V PORT
1 series · 1 of 1 positions shown · non-contrast
Comparison: Chest x-ray dated October 04, 2021

CLINICAL DATA: Interval placement of Port-A-Cath

EXAM:
PORTABLE CHEST 1 VIEW

[chest]
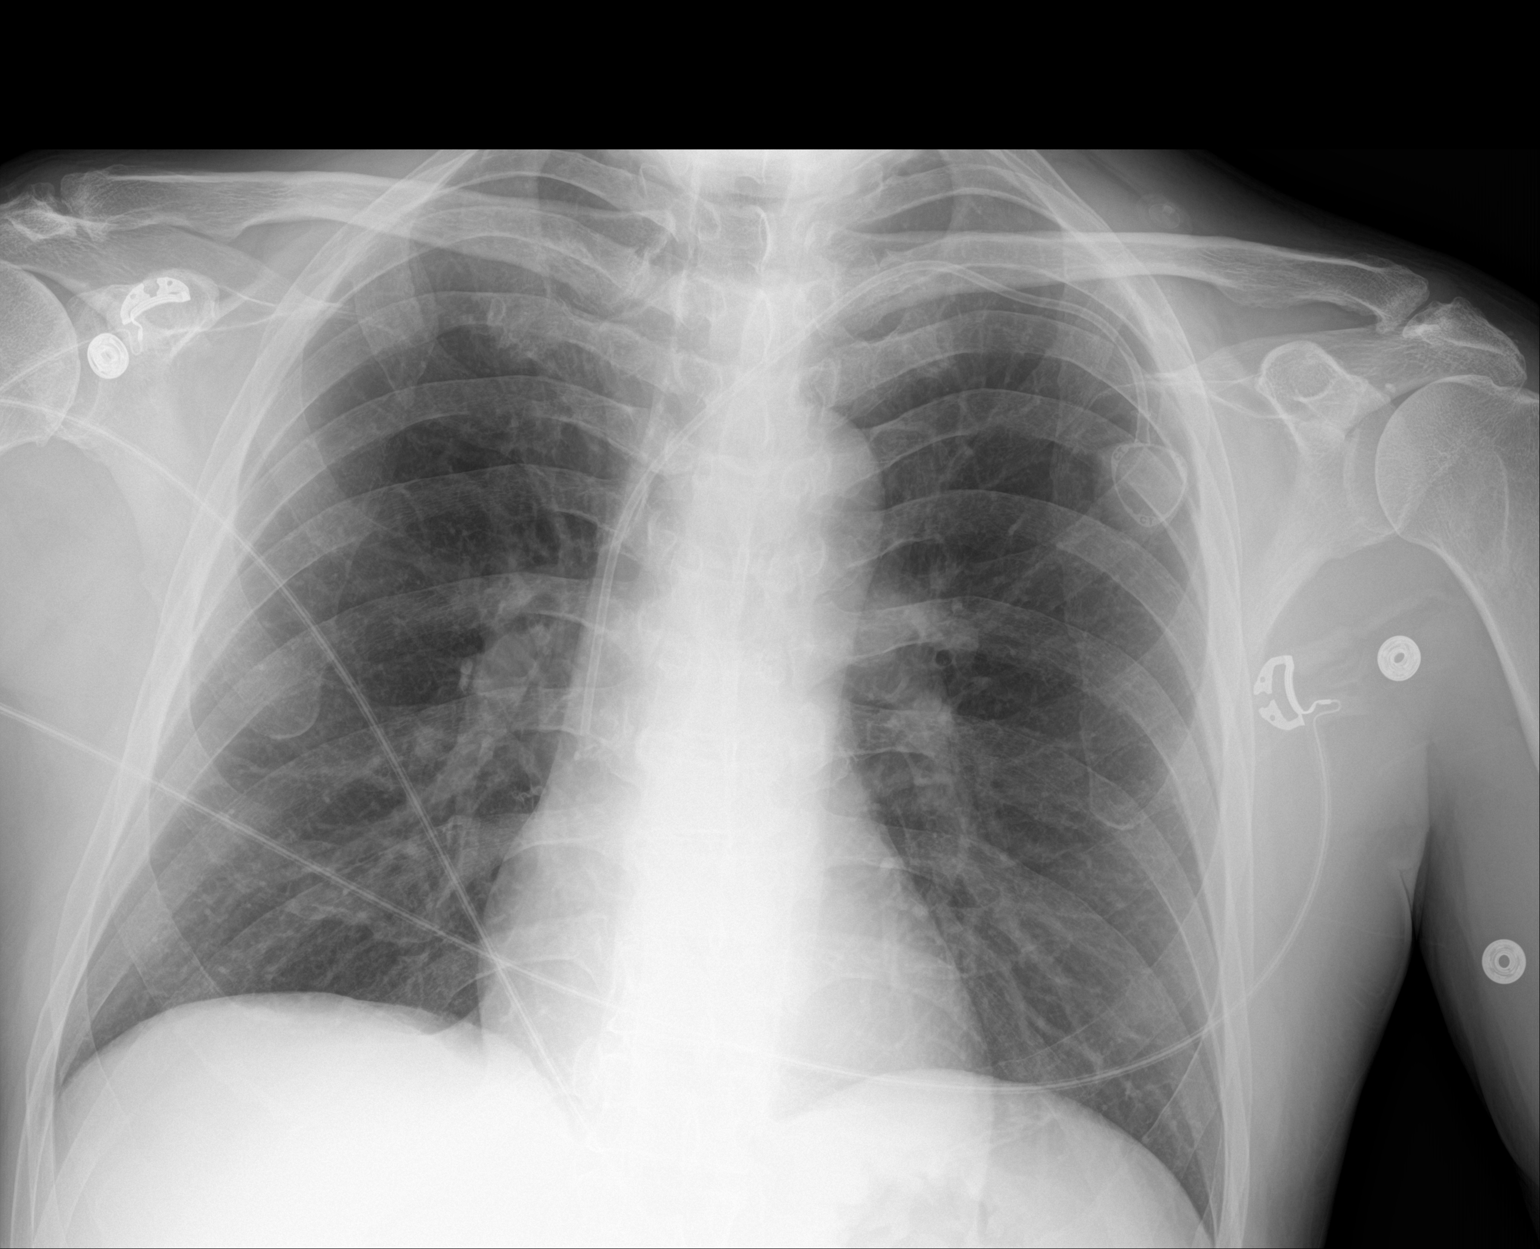

[1 of 1 positions shown; findings below may reference images not displayed]

FINDINGS: Interval placement of left chest wall Port-A-Cath with tip overlying
the expected area of the lower SVC. Cardiac and mediastinal contours
are within normal limits. Lungs are clear. No pleural effusion or
pneumothorax.
IMPRESSION: Interval placement of left chest wall Port-A-Cath with tip overlying
the expected area of the lower SVC.
